# Patient Record
Sex: Female | Born: 1960 | Race: White | Hispanic: No | Marital: Married | Smoking: Never smoker
Health system: Southern US, Community
[De-identification: ages and names within clinical notes are randomized; demographics above are authoritative.]

## PROBLEM LIST (undated history)

## (undated) DIAGNOSIS — C9590 Leukemia, unspecified not having achieved remission: Secondary | ICD-10-CM

## (undated) DIAGNOSIS — Z8619 Personal history of other infectious and parasitic diseases: Secondary | ICD-10-CM

## (undated) DIAGNOSIS — Z87898 Personal history of other specified conditions: Secondary | ICD-10-CM

## (undated) DIAGNOSIS — Q359 Cleft palate, unspecified: Secondary | ICD-10-CM

## (undated) DIAGNOSIS — D219 Benign neoplasm of connective and other soft tissue, unspecified: Secondary | ICD-10-CM

## (undated) DIAGNOSIS — N979 Female infertility, unspecified: Secondary | ICD-10-CM

## (undated) DIAGNOSIS — G4730 Sleep apnea, unspecified: Secondary | ICD-10-CM

## (undated) DIAGNOSIS — R519 Headache, unspecified: Secondary | ICD-10-CM

## (undated) DIAGNOSIS — Z8639 Personal history of other endocrine, nutritional and metabolic disease: Secondary | ICD-10-CM

## (undated) DIAGNOSIS — E039 Hypothyroidism, unspecified: Secondary | ICD-10-CM

## (undated) DIAGNOSIS — G4733 Obstructive sleep apnea (adult) (pediatric): Secondary | ICD-10-CM

## (undated) DIAGNOSIS — I10 Essential (primary) hypertension: Secondary | ICD-10-CM

## (undated) DIAGNOSIS — N841 Polyp of cervix uteri: Secondary | ICD-10-CM

## (undated) DIAGNOSIS — M1990 Unspecified osteoarthritis, unspecified site: Secondary | ICD-10-CM

## (undated) DIAGNOSIS — D471 Chronic myeloproliferative disease: Secondary | ICD-10-CM

## (undated) DIAGNOSIS — E0590 Thyrotoxicosis, unspecified without thyrotoxic crisis or storm: Secondary | ICD-10-CM

## (undated) DIAGNOSIS — N920 Excessive and frequent menstruation with regular cycle: Secondary | ICD-10-CM

## (undated) DIAGNOSIS — E0500 Thyrotoxicosis with diffuse goiter without thyrotoxic crisis or storm: Secondary | ICD-10-CM

## (undated) DIAGNOSIS — Z8742 Personal history of other diseases of the female genital tract: Secondary | ICD-10-CM

## (undated) HISTORY — DX: Personal history of other endocrine, nutritional and metabolic disease: Z86.39

## (undated) HISTORY — DX: Excessive and frequent menstruation with regular cycle: N92.0

## (undated) HISTORY — DX: Polyp of cervix uteri: N84.1

## (undated) HISTORY — PX: REFRACTIVE SURGERY: SHX103

## (undated) HISTORY — DX: Personal history of other infectious and parasitic diseases: Z86.19

## (undated) HISTORY — DX: Chronic myeloproliferative disease: D47.1

## (undated) HISTORY — DX: Personal history of other specified conditions: Z87.898

## (undated) HISTORY — DX: Essential (primary) hypertension: I10

## (undated) HISTORY — DX: Female infertility, unspecified: N97.9

## (undated) HISTORY — PX: MOUTH SURGERY: SHX715

## (undated) HISTORY — DX: Unspecified osteoarthritis, unspecified site: M19.90

## (undated) HISTORY — DX: Hypothyroidism, unspecified: E03.9

## (undated) HISTORY — DX: Sleep apnea, unspecified: G47.30

## (undated) HISTORY — DX: Cleft palate, unspecified: Q35.9

## (undated) HISTORY — DX: Personal history of other diseases of the female genital tract: Z87.42

## (undated) HISTORY — DX: Benign neoplasm of connective and other soft tissue, unspecified: D21.9

## (undated) HISTORY — DX: Thyrotoxicosis with diffuse goiter without thyrotoxic crisis or storm: E05.00

## (undated) HISTORY — DX: Thyrotoxicosis, unspecified without thyrotoxic crisis or storm: E05.90

---
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
## 1984-08-08 HISTORY — PX: BUNIONECTOMY: SHX129

## 1999-02-28 ENCOUNTER — Encounter (INDEPENDENT_AMBULATORY_CARE_PROVIDER_SITE_OTHER): Payer: Self-pay | Admitting: Specialist

## 1999-02-28 ENCOUNTER — Inpatient Hospital Stay (HOSPITAL_COMMUNITY): Admission: AD | Admit: 1999-02-28 | Discharge: 1999-02-28 | Payer: Self-pay | Admitting: Obstetrics & Gynecology

## 1999-12-09 ENCOUNTER — Other Ambulatory Visit: Admission: RE | Admit: 1999-12-09 | Discharge: 1999-12-09 | Payer: Self-pay | Admitting: Obstetrics & Gynecology

## 1999-12-22 ENCOUNTER — Encounter (INDEPENDENT_AMBULATORY_CARE_PROVIDER_SITE_OTHER): Payer: Self-pay

## 1999-12-22 ENCOUNTER — Other Ambulatory Visit: Admission: RE | Admit: 1999-12-22 | Discharge: 1999-12-22 | Payer: Self-pay | Admitting: Obstetrics & Gynecology

## 2000-12-29 ENCOUNTER — Other Ambulatory Visit: Admission: RE | Admit: 2000-12-29 | Discharge: 2000-12-29 | Payer: Self-pay | Admitting: Obstetrics & Gynecology

## 2002-07-30 ENCOUNTER — Other Ambulatory Visit: Admission: RE | Admit: 2002-07-30 | Discharge: 2002-07-30 | Payer: Self-pay | Admitting: Obstetrics & Gynecology

## 2003-09-09 ENCOUNTER — Other Ambulatory Visit: Admission: RE | Admit: 2003-09-09 | Discharge: 2003-09-09 | Payer: Self-pay | Admitting: Obstetrics & Gynecology

## 2004-09-10 ENCOUNTER — Other Ambulatory Visit: Admission: RE | Admit: 2004-09-10 | Discharge: 2004-09-10 | Payer: Self-pay | Admitting: Obstetrics & Gynecology

## 2004-11-05 ENCOUNTER — Ambulatory Visit: Payer: Self-pay | Admitting: Internal Medicine

## 2004-11-08 ENCOUNTER — Ambulatory Visit: Payer: Self-pay | Admitting: Internal Medicine

## 2005-02-17 ENCOUNTER — Ambulatory Visit: Payer: Self-pay | Admitting: Internal Medicine

## 2005-02-18 ENCOUNTER — Ambulatory Visit: Payer: Self-pay | Admitting: Internal Medicine

## 2005-02-28 ENCOUNTER — Encounter (HOSPITAL_COMMUNITY): Admission: RE | Admit: 2005-02-28 | Discharge: 2005-03-14 | Payer: Self-pay | Admitting: Internal Medicine

## 2005-03-09 ENCOUNTER — Encounter: Admission: RE | Admit: 2005-03-09 | Discharge: 2005-03-09 | Payer: Self-pay | Admitting: Internal Medicine

## 2005-03-09 IMAGING — CT CT ORBIT/TEMPORAL/IAC W/O CM
3 of 4 series · 17 of 40 positions shown, 20 images · IV contrast (agent unspecified)
Comparison: None.

CLINICAL DATA: Graves disease.  
 CT OF THE ORBITS WITHOUT CONTRAST:
TECHNIQUE: Axial and coronal plane CT imaging was performed through the orbits.  No intravenous contrast was administered.

[Series 4: recon 3: axial orbits · axial · 0.33mm/px · z∈[-37,+32]mm · 12 of 65 slices shown, 15 images]
[im 5/65  brain]
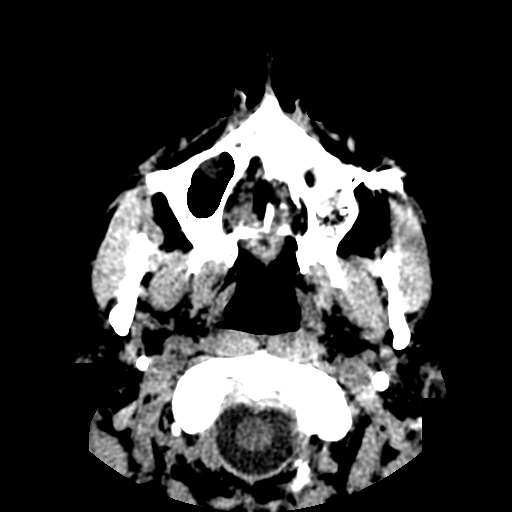
[im 5/65  bone]
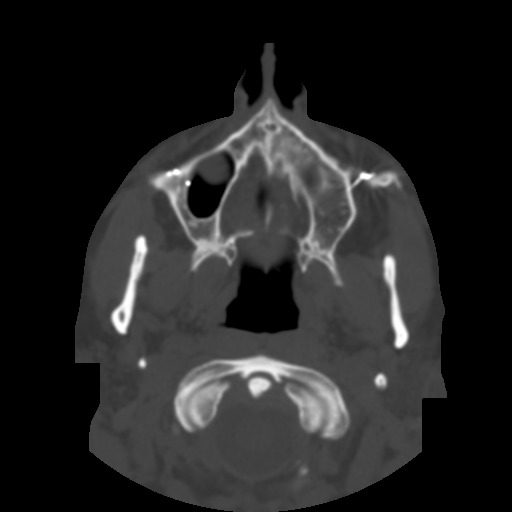
[im 10/65  bone]
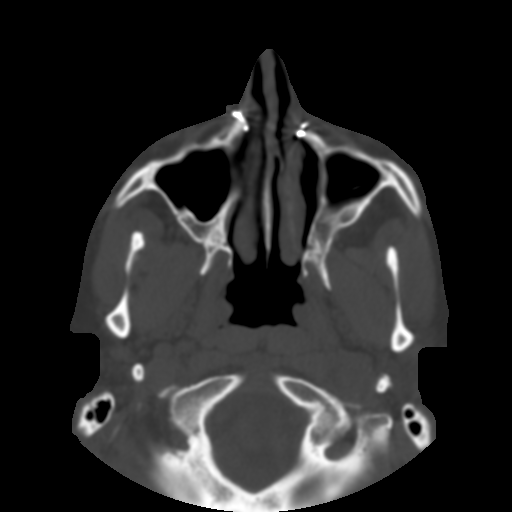
[im 15/65  bone]
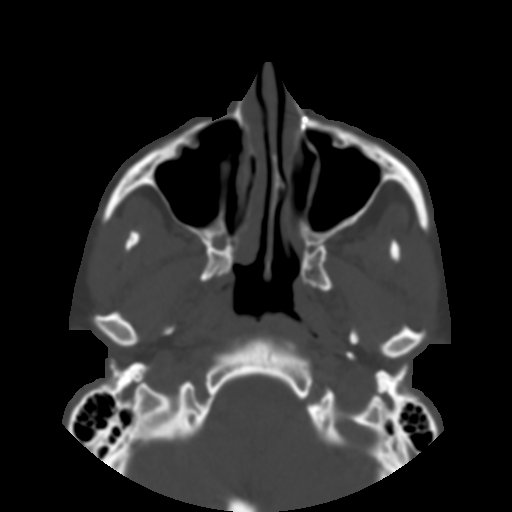
[im 20/65  bone]
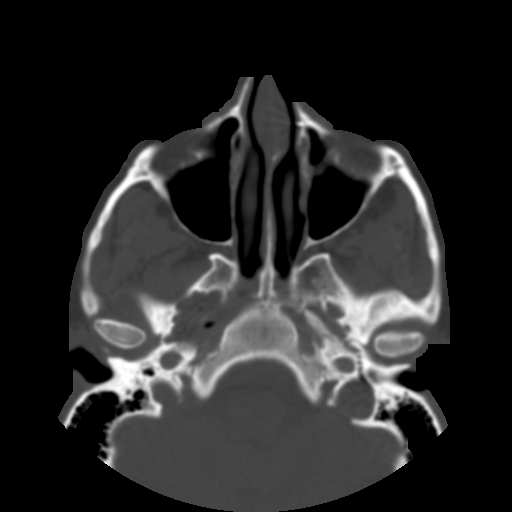
[im 25/65  brain]
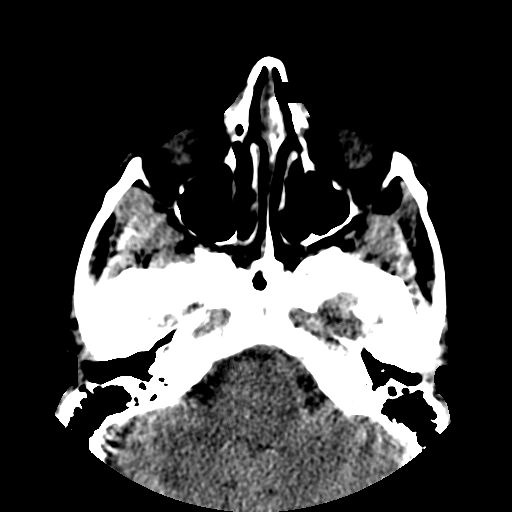
[im 25/65  bone]
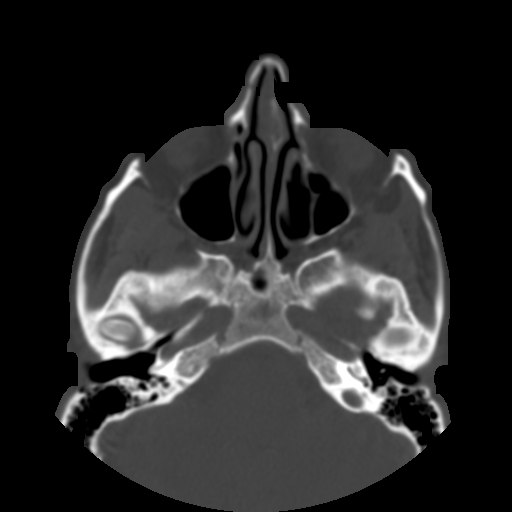
[im 30/65  bone]
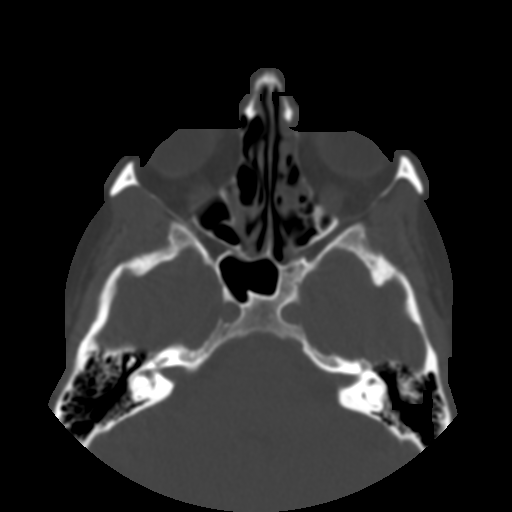
[im 35/65  bone]
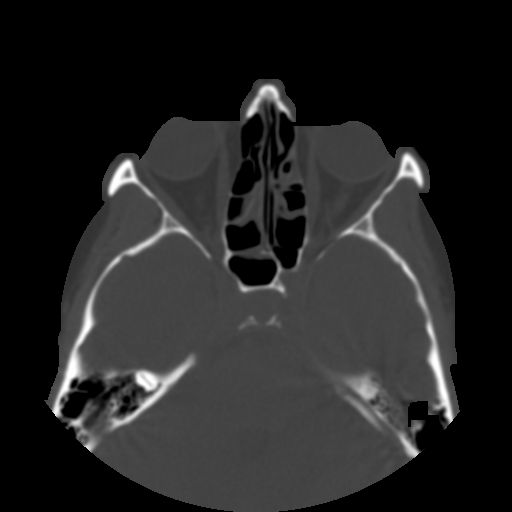
[im 40/65  bone]
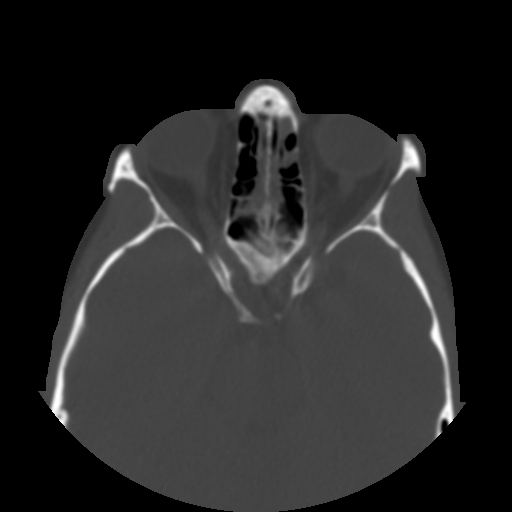
[im 45/65  brain]
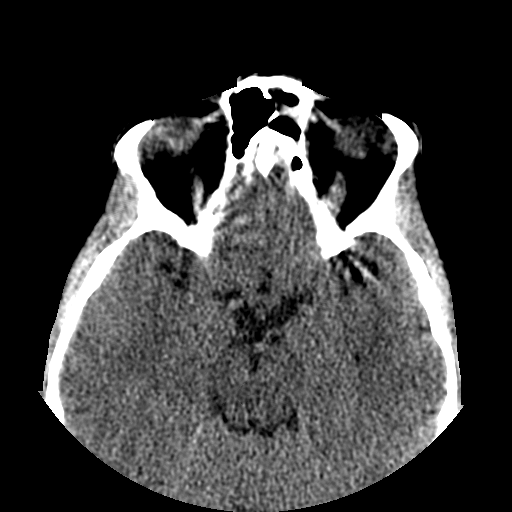
[im 45/65  bone]
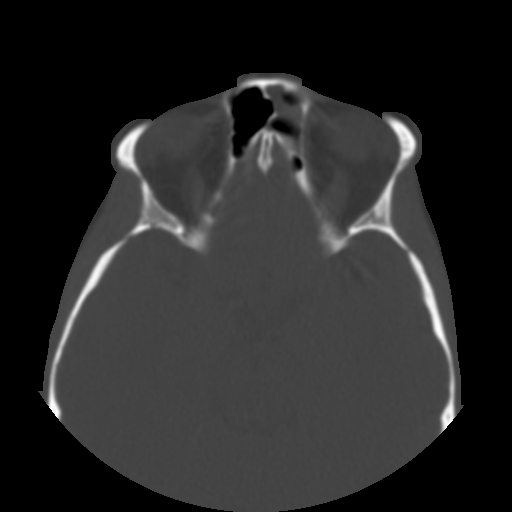
[im 50/65  bone]
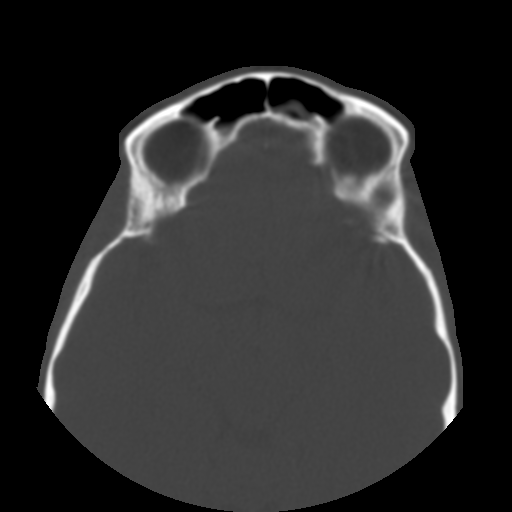
[im 55/65  bone]
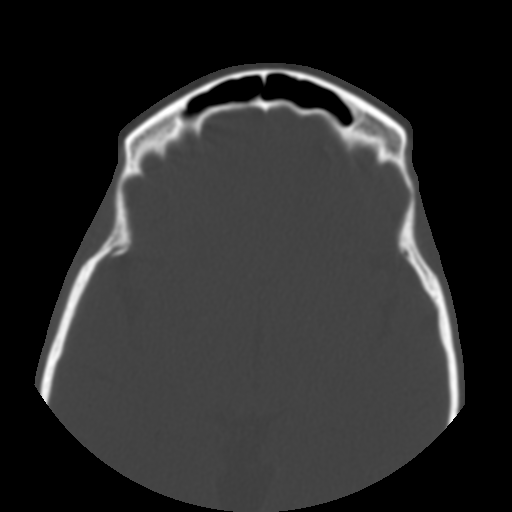
[im 60/65  bone]
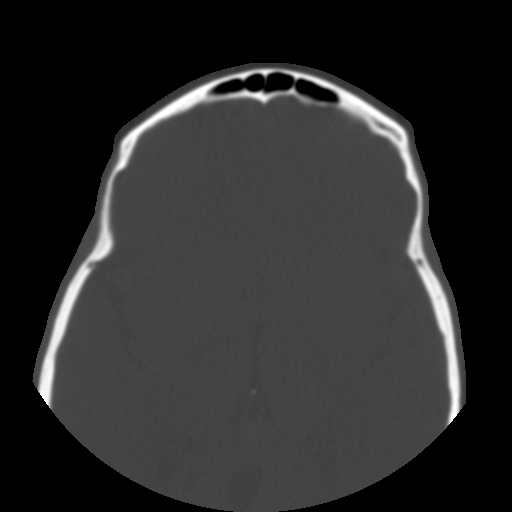

[Series 6: coronal orbits · axial · 0.33mm/px · z∈[-83,-71]mm · 2 of 39 slices shown]
[im 6/39  bone]
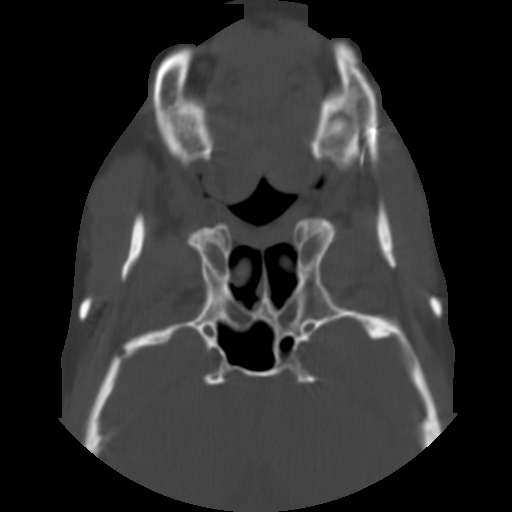
[im 11/39  bone]
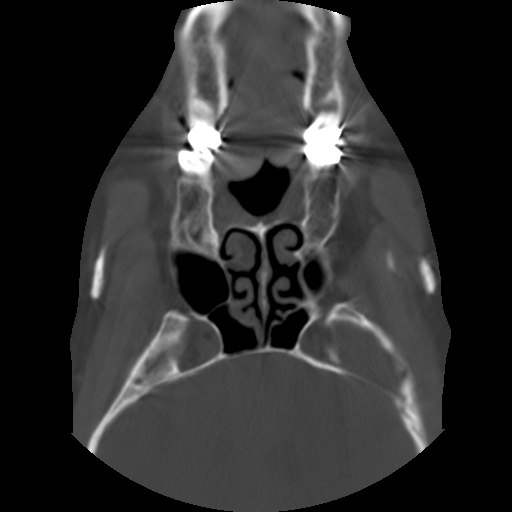

[Series 103: reformatted · coronal · 0.33mm/px · 3 of 40 slices shown]
[im 14/40  bone]
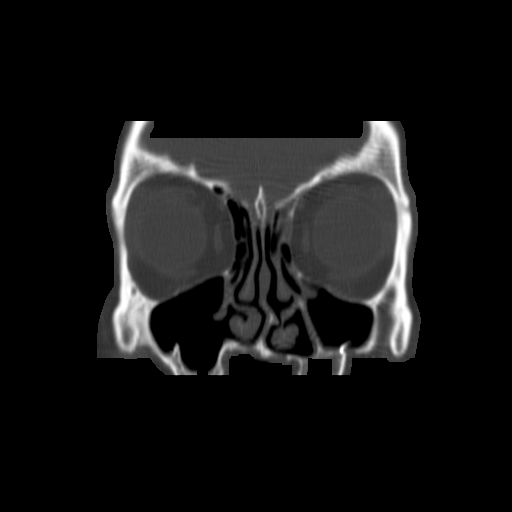
[im 18/40  bone]
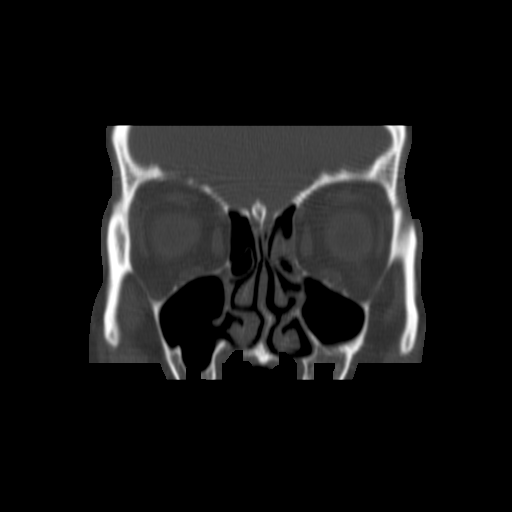
[im 22/40  bone]
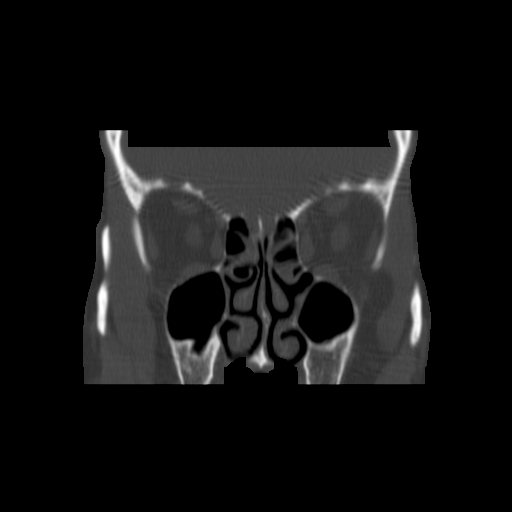

[17 of 40 positions shown; findings below may reference images not displayed]

FINDINGS: The orbits are normal in appearance.   The extraocular muscles appear normal in appearance without evidence for abnormal thickening.  The intraconal fat is well preserved.  No inflammatory-type changes are noted.  There is mild sinus disease affecting the right maxillary sinus. There is minimal left frontal sinus disease as well.
IMPRESSION: No evidence for DAANISH involvement of the extraocular muscles.

## 2005-03-16 ENCOUNTER — Encounter (HOSPITAL_COMMUNITY): Admission: RE | Admit: 2005-03-16 | Discharge: 2005-06-14 | Payer: Self-pay | Admitting: Endocrinology

## 2005-04-06 ENCOUNTER — Ambulatory Visit: Payer: Self-pay | Admitting: Internal Medicine

## 2005-05-10 ENCOUNTER — Ambulatory Visit: Payer: Self-pay | Admitting: Family Medicine

## 2005-06-07 ENCOUNTER — Ambulatory Visit: Payer: Self-pay | Admitting: Family Medicine

## 2005-06-24 ENCOUNTER — Ambulatory Visit: Payer: Self-pay | Admitting: Internal Medicine

## 2005-08-12 ENCOUNTER — Ambulatory Visit: Payer: Self-pay | Admitting: Internal Medicine

## 2005-09-19 ENCOUNTER — Ambulatory Visit: Payer: Self-pay | Admitting: Internal Medicine

## 2005-10-31 ENCOUNTER — Other Ambulatory Visit: Admission: RE | Admit: 2005-10-31 | Discharge: 2005-10-31 | Payer: Self-pay | Admitting: Obstetrics & Gynecology

## 2005-11-25 ENCOUNTER — Ambulatory Visit: Payer: Self-pay | Admitting: Internal Medicine

## 2005-12-02 ENCOUNTER — Ambulatory Visit: Payer: Self-pay | Admitting: Internal Medicine

## 2006-05-05 ENCOUNTER — Ambulatory Visit: Payer: Self-pay | Admitting: Internal Medicine

## 2006-08-02 ENCOUNTER — Ambulatory Visit: Payer: Self-pay | Admitting: Family Medicine

## 2006-09-25 ENCOUNTER — Ambulatory Visit: Payer: Self-pay | Admitting: Licensed Clinical Social Worker

## 2007-03-02 ENCOUNTER — Encounter: Payer: Self-pay | Admitting: Internal Medicine

## 2007-03-02 DIAGNOSIS — J309 Allergic rhinitis, unspecified: Secondary | ICD-10-CM | POA: Insufficient documentation

## 2007-03-02 DIAGNOSIS — E0500 Thyrotoxicosis with diffuse goiter without thyrotoxic crisis or storm: Secondary | ICD-10-CM | POA: Insufficient documentation

## 2007-03-02 DIAGNOSIS — H9190 Unspecified hearing loss, unspecified ear: Secondary | ICD-10-CM | POA: Insufficient documentation

## 2007-03-05 ENCOUNTER — Ambulatory Visit: Payer: Self-pay | Admitting: Internal Medicine

## 2007-03-05 DIAGNOSIS — G4700 Insomnia, unspecified: Secondary | ICD-10-CM | POA: Insufficient documentation

## 2007-03-05 DIAGNOSIS — M25549 Pain in joints of unspecified hand: Secondary | ICD-10-CM | POA: Insufficient documentation

## 2007-03-06 DIAGNOSIS — E0590 Thyrotoxicosis, unspecified without thyrotoxic crisis or storm: Secondary | ICD-10-CM | POA: Insufficient documentation

## 2007-05-16 ENCOUNTER — Telehealth: Payer: Self-pay | Admitting: Internal Medicine

## 2007-05-21 ENCOUNTER — Encounter: Payer: Self-pay | Admitting: Internal Medicine

## 2007-05-21 ENCOUNTER — Telehealth (INDEPENDENT_AMBULATORY_CARE_PROVIDER_SITE_OTHER): Payer: Self-pay | Admitting: *Deleted

## 2007-06-18 ENCOUNTER — Encounter: Payer: Self-pay | Admitting: Family Medicine

## 2007-09-24 ENCOUNTER — Encounter: Payer: Self-pay | Admitting: Internal Medicine

## 2008-01-14 ENCOUNTER — Telehealth: Payer: Self-pay | Admitting: Internal Medicine

## 2008-01-29 ENCOUNTER — Ambulatory Visit: Payer: Self-pay | Admitting: Internal Medicine

## 2008-06-03 ENCOUNTER — Ambulatory Visit: Payer: Self-pay | Admitting: Internal Medicine

## 2008-06-04 ENCOUNTER — Encounter: Payer: Self-pay | Admitting: Internal Medicine

## 2008-06-16 ENCOUNTER — Telehealth: Payer: Self-pay | Admitting: Family Medicine

## 2008-06-17 ENCOUNTER — Encounter: Payer: Self-pay | Admitting: Internal Medicine

## 2008-06-17 ENCOUNTER — Telehealth: Payer: Self-pay | Admitting: Internal Medicine

## 2008-08-18 ENCOUNTER — Telehealth: Payer: Self-pay | Admitting: Internal Medicine

## 2008-12-19 ENCOUNTER — Ambulatory Visit: Payer: Self-pay | Admitting: Internal Medicine

## 2008-12-19 DIAGNOSIS — M779 Enthesopathy, unspecified: Secondary | ICD-10-CM | POA: Insufficient documentation

## 2009-01-27 ENCOUNTER — Telehealth: Payer: Self-pay | Admitting: Internal Medicine

## 2009-01-29 ENCOUNTER — Telehealth: Payer: Self-pay | Admitting: Internal Medicine

## 2009-04-15 ENCOUNTER — Telehealth: Payer: Self-pay | Admitting: *Deleted

## 2009-06-30 ENCOUNTER — Encounter (INDEPENDENT_AMBULATORY_CARE_PROVIDER_SITE_OTHER): Payer: Self-pay | Admitting: *Deleted

## 2009-07-20 ENCOUNTER — Encounter (INDEPENDENT_AMBULATORY_CARE_PROVIDER_SITE_OTHER): Payer: Self-pay | Admitting: *Deleted

## 2009-08-31 ENCOUNTER — Telehealth: Payer: Self-pay | Admitting: *Deleted

## 2009-09-29 ENCOUNTER — Telehealth: Payer: Self-pay | Admitting: Internal Medicine

## 2010-04-19 ENCOUNTER — Telehealth: Payer: Self-pay | Admitting: *Deleted

## 2010-05-13 ENCOUNTER — Ambulatory Visit: Payer: Self-pay | Admitting: Internal Medicine

## 2010-05-17 ENCOUNTER — Ambulatory Visit: Payer: Self-pay | Admitting: Internal Medicine

## 2010-05-18 ENCOUNTER — Encounter: Payer: Self-pay | Admitting: *Deleted

## 2010-05-18 LAB — CONVERTED CEMR LAB
AST: 18 units/L (ref 0–37)
Albumin: 3.8 g/dL (ref 3.5–5.2)
BUN: 17 mg/dL (ref 6–23)
Basophils Relative: 1 % (ref 0.0–3.0)
Bilirubin, Direct: 0.1 mg/dL (ref 0.0–0.3)
CO2: 26 meq/L (ref 19–32)
Cholesterol: 195 mg/dL (ref 0–200)
Eosinophils Relative: 3.8 % (ref 0.0–5.0)
GFR calc non Af Amer: 76.32 mL/min (ref >60)
Lymphocytes Relative: 30.4 % (ref 12.0–46.0)
MCHC: 35.2 g/dL (ref 30.0–36.0)
Monocytes Absolute: 0.5 10*3/uL (ref 0.1–1.0)
Neutrophils Relative %: 55.6 % (ref 43.0–77.0)
Platelets: 267 10*3/uL (ref 150.0–400.0)
Potassium: 4.7 meq/L (ref 3.5–5.1)
RBC: 4.34 M/uL (ref 3.87–5.11)
RDW: 12.5 % (ref 11.5–14.6)
Total Bilirubin: 0.5 mg/dL (ref 0.3–1.2)
Total Protein: 6.7 g/dL (ref 6.0–8.3)
WBC: 5.1 10*3/uL (ref 4.5–10.5)

## 2010-08-08 DIAGNOSIS — Z8742 Personal history of other diseases of the female genital tract: Secondary | ICD-10-CM

## 2010-08-08 HISTORY — DX: Personal history of other diseases of the female genital tract: Z87.42

## 2010-09-07 NOTE — Letter (Signed)
Summary: Generic Letter  [facility]  [address] [place], [place] [address]   Phone: [phone]  Fax: [phone]    05/18/2010  Ambrea Delpizzo [address] [place], [place]  [address]  Dear Ms. Clent Ridges,  (1) TSH (TSH)   FastTSH                   2.12 uIU/mL                 0.35-5.50  Tests: (2) Hepatic/Liver Function Panel (HEPATIC)   Total Bilirubin           0.5 mg/dL                   5.7-8.4   Direct Bilirubin          0.1 mg/dL                   6.9-6.2   Alkaline Phosphatase      44 U/L                      39-117   AST                       18 U/L                      0-37   ALT                       14 U/L                      0-35   Total Protein             6.7 g/dL                    9.5-2.8   Albumin                   3.8 g/dL                    4.1-3.2  Tests: (3) CBC Platelet w/Diff (CBCD)   White Cell Count          5.1 K/uL                    4.5-10.5   Red Cell Count            4.34 Mil/uL                 3.87-5.11   Hemoglobin                14.2 g/dL                   44.0-10.2   Hematocrit                40.3 %                      36.0-46.0   MCV                       92.8 fl                     78.0-100.0   MCHC  35.2 g/dL                   14.7-82.9   RDW                       12.5 %                      11.5-14.6   Platelet Count            267.0 K/uL                  150.0-400.0   Neutrophil %              55.6 %                      43.0-77.0   Lymphocyte %              30.4 %                      12.0-46.0   Monocyte %                9.2 %                       3.0-12.0   Eosinophils%              3.8 %                       0.0-5.0   Basophils %               1.0 %                       0.0-3.0   Neutrophill Absolute      2.8 K/uL                    1.4-7.7   Lymphocyte Absolute       1.6 K/uL                    0.7-4.0   Monocyte Absolute         0.5 K/uL                    0.1-1.0  Eosinophils, Absolute                  0.2 K/uL                    0.0-0.7   Basophils Absolute        0.1 K/uL                    0.0-0.1  Tests: (4) BMP (METABOL)   Sodium                    139 mEq/L                   135-145   Potassium                 4.7 mEq/L                   3.5-5.1   Chloride                  105 mEq/L  96-112   Carbon Dioxide            26 mEq/L                    19-32   Glucose                   89 mg/dL                    16-10   BUN                       17 mg/dL                    9-60   Creatinine                0.8 mg/dL                   4.5-4.0   Calcium                   9.3 mg/dL                   9.8-11.9   GFR                       76.32 mL/min                >60  Tests: (5) Lipid Panel (LIPID)   Cholesterol               195 mg/dL                   1-478     ATP III Classification            Desirable:  < 200 mg/dL                    Borderline High:  200 - 239 mg/dL               High:  > = 240 mg/dL   Triglycerides             96.0 mg/dL                  2.9-562.1     Normal:  <150 mg/dL     Borderline High:  308 - 199 mg/dL   HDL                       65.78 mg/dL                 >46.96   VLDL Cholesterol          19.2 mg/dL                  2.9-52.8   LDL Cholesterol      [H]  413 mg/dL                   2-44  CHO/HDL Ratio:  CHD Risk                             4                    Men          Women     1/2 Average Risk  3.4          3.3     Average Risk          5.0          4.4     2X Average Risk          9.6          7.1     3X Average Risk          15.0          11.0                 Your labs are normal. If you have any questions, please give [place] a call at [phone]      Sincerely,   Tor Netters, CMA (AAMA)

## 2010-09-07 NOTE — Progress Notes (Signed)
  Phone Note Call from Patient Call back at Home Phone [phone]   Caller: Spouse Summary of Call: please refill Zolpidem for 6 months  Initial call taken by: Nelwyn Salisbury MD,  April 19, 2010 1:03 PM  Follow-up for Phone Call        ok to do . but needs  yearly offic e check when convenient Follow-up by: Madelin Headings MD,  April 19, 2010 1:39 PM  Additional Follow-up for Phone Call Additional follow up Details #1::        Rx called in. Additional Follow-up by: Romualdo Bolk, CMA (AAMA),  April 19, 2010 4:21 PM    Prescriptions: AMBIEN 10 MG  TABS (ZOLPIDEM TARTRATE) 1 by mouth q hs as needed.  #30 x 5   Entered by:   Romualdo Bolk, CMA (AAMA)   Authorized by:   Madelin Headings MD   Signed by:   Romualdo Bolk, CMA (AAMA) on 04/19/2010   Method used:   Telephoned to ...       [facility] [address] [phone]* (retail)       [address]       [place], [place]  [address]       Ph: [phone] or [phone]       Fax: [phone]   RxID:   [id number]

## 2010-09-07 NOTE — Assessment & Plan Note (Signed)
Summary: med check/refills/cjr   Vital Signs:  Patient profile:   50 year old female Menstrual status:  regular LMP:     04/21/2010 Height:      67.5 inches Weight:      168 pounds BMI:     26.02 Pulse rate:   78 / minute BP sitting:   120 / 80  (right arm) Cuff size:   regular  Vitals Entered By: Romualdo Bolk, CMA (AAMA) (May 17, 2010 8:54 AM) CC: Follow-up visit on meds LMP (date): 04/21/2010 LMP - Character: heavy Menarche (age onset years): 10   Menses interval (days): 28-30 Menstrual flow (days): 5 Enter LMP: 04/21/2010   History of Present Illness: Christina [name] comes in today  for follow up of medicine  and rx  Since last visit over a year ago she has done well . Sees Dr Talmage Nap for her thyroid replacement and sees her GYNE. SHe still has some joint aches  . Takes  celebrex  daily almost daily for    hands and feet.    denies se of meds .NO recent labs to monitor for this  taking ambien    fairly regulary and helps .working and childerm.  No new symptoms . Thyroid   stable     .    Gets yearly check.   Preventive Screening-Counseling & Management  Alcohol-Tobacco     Alcohol drinks/day: 1     Alcohol type: wine     Smoking Status: never     Passive Smoke Exposure: no  Caffeine-Diet-Exercise     Caffeine use/day: 1     Does Patient Exercise: no  Current Medications (verified): 1)  Zyrtec Allergy 10 Mg  Tabs (Cetirizine Hcl) 2)  Synthroid 112 Mcg Tabs (Levothyroxine Sodium) 3)  Multi-Vitamin   Tabs (Multiple Vitamin) 4)  Ambien 10 Mg  Tabs (Zolpidem Tartrate) .Marland Kitchen.. 1 By Mouth Q Hs As Needed. 5)  Celebrex 200 Mg Caps (Celecoxib) .Marland Kitchen.. 1 By Mouth Two Times A Day  Allergies (verified): 1)  ! Toprol Xl (Metoprolol Succinate)  Past History:  Past medical, surgical, family and social histories (including risk factors) reviewed, and no changes noted (except as noted below).  Past Medical History: Allergic rhinitis Hyperthyroidism graves s/p rai  8/06now  hypothyroid.  Dr. Horald Pollen Hearing deficit. with hearing aids Insomnia    Past Surgical History: Tonsillectomy Oral Surgery Bunion Surgery laser  surgery for vision.   Past History:  Care Management: Endocrinology: Horald Pollen Gynecology: Arlyce Dice  Family History: Reviewed history from 01/29/2008 and no changes required. Family History Hypertension Prostate cancer GF DM mom  Social History: Reviewed history from 12/19/2008 and no changes required. 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         ha with advil  Physical Exam  General:  alert, well-developed, well-nourished, and well-hydrated.   Head:  normocephalic and atraumatic.   Eyes:  vision grossly intact.   Neck:  palpable non tender Lungs:  Normal respiratory effort, chest expands symmetrically. Lungs are clear to auscultation, no crackles or wheezes. Heart:  Normal rate and regular rhythm. S1 and S2 normal without gallop, murmur, click, rub or  other extra sounds.no lifts.   Abdomen:  Bowel sounds positive,abdomen soft and non-tender without masses, organomegaly or  noted. Msk:  no joint tenderness, no joint swelling, no joint warmth, and no redness over joints.   Pulses:  pulses intact without delay   Extremities:  no clubbing cyanosis or edema  Neurologic:  alert & oriented X3, strength normal in all extremities, gait normal, and DTRs symmetrical and normal.    no tremor Skin:  turgor normal, color normal, no ecchymoses, and no petechiae.   Cervical Nodes:  No lymphadenopathy noted Psych:  Oriented X3, normally  interactive, good eye contact, not anxious appearing, and not depressed appearing.     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 No alarm systemic symptoms   Problem # 2:  INSOMNIA (ICD-780.52) ongogind  .  idsc risk  benefit of meds . continue for now Her updated medication list for this problem includes:    Ambien 10 Mg Tabs (Zolpidem tartrate) .Marland Kitchen... 1 by mouth q hs as needed.  Orders: TLB-TSH (Thyroid Stimulating Hormone) (84443-TSH)  Problem # 3:  GRAVE'S DISEASE (ICD-242.00) on replacement   stable Orders: Venipuncture ([address]) Specimen Handling ([address]) TLB-TSH (Thyroid Stimulating Hormone) (84443-TSH)  Problem # 4:  ALLERGIC RHINITIS (ICD-477.9)  Her updated medication list for this problem includes:    Zyrtec Allergy 10 Mg Tabs (Cetirizine hcl)  Problem # 5:  PREVENTIVE HEALTH CARE (ICD-V70.0) due for lab and monitoring  Orders: Venipuncture ([address]) Specimen Handling ([address]) TLB-TSH (Thyroid Stimulating Hormone) (84443-TSH) TLB-Hepatic/Liver Function Pnl (80076-HEPATIC) TLB-CBC Platelet - w/Differential (85025-CBCD) TLB-BMP (Basic Metabolic Panel-BMET) (80048-METABOL) TLB-Lipid Panel (80061-LIPID)  Complete Medication List: 1)  Zyrtec Allergy 10 Mg Tabs (Cetirizine hcl) 2)  Synthroid 112 Mcg Tabs (Levothyroxine sodium) 3)  Multi-vitamin Tabs (Multiple vitamin) 4)  Ambien 10 Mg Tabs (Zolpidem tartrate) .Marland Kitchen.. 1 by mouth q hs as needed. 5)  Celebrex 200 Mg Caps (Celecoxib) .Marland Kitchen.. 1 by mouth two times a day  Patient Instructions: 1)  You will be informed of lab results when available.  2)  call when you want the colonoscopy    referral.  at age 18  3)  ROV yearly  Prescriptions: CELEBREX 200 MG CAPS (CELECOXIB) 1 by mouth two times a day  #60 x 3   Entered and Authorized by:   Madelin Headings MD   Signed by:   Madelin Headings MD on 05/17/2010   Method used:   Electronically to         [facility]. [address] [phone]* (retail)       [address]       [place], [place]  [address]       Ph: [phone] or [phone]       Fax: [phone]   RxID:   [id number]

## 2010-09-07 NOTE — Progress Notes (Signed)
  Phone Note Call from Patient Call back at Home Phone [phone]   Caller: Spouse Summary of Call: For 2 weeks has had constant sinus pressure, nasal congestion, and HA. No fever or ST or cough. has taken 8 days of Avelox with no improvement. Also using Nasonex, Advil, and Mucinex.  Initial call taken by: Nelwyn Salisbury MD,  August 31, 2009 1:06 PM  Follow-up for Phone Call        prednisone  20 mg take 2 by mouth once daily for  5 days  or as directed .Disp 10 of 20 mg if not better  then OV to evaluate.  Follow-up by: Madelin Headings MD,  August 31, 2009 1:19 PM  Additional Follow-up for Phone Call Additional follow up Details #1::        Rx called in and pt aware. Additional Follow-up by: Romualdo Bolk, CMA (AAMA),  August 31, 2009 2:30 PM    New/Updated Medications: PREDNISONE 20 MG TABS (PREDNISONE) 2 by mouth once daily for 5 days Prescriptions: PREDNISONE 20 MG TABS (PREDNISONE) 2 by mouth once daily for 5 days  #10 x 0   Entered by:   Romualdo Bolk, CMA (AAMA)   Authorized by:   Madelin Headings MD   Signed by:   Romualdo Bolk, CMA (AAMA) on 08/31/2009   Method used:   Electronically to        [facility]. [address] [phone]* (retail)       [address]       [place], [place]  [address]       Ph: [phone] or [phone]       Fax: [phone]   RxID:   [id number]

## 2010-09-07 NOTE — Progress Notes (Signed)
  Phone Note Call from Patient Call back at Home Phone [phone]   Caller: Spouse Reason for Call: Talk to Doctor Summary of Call: please refill Zolpidem  Initial call taken by: Nelwyn Salisbury MD,  September 29, 2009 1:33 PM  Follow-up for Phone Call        please refill x 5  Follow-up by: Madelin Headings MD,  September 29, 2009 1:35 PM  Additional Follow-up for Phone Call Additional follow up Details #1::        Phone Call Completed, Rx Called In Additional Follow-up by: Alfred Levins, CMA,  September 29, 2009 3:21 PM    Prescriptions: AMBIEN 10 MG  TABS (ZOLPIDEM TARTRATE) 1 by mouth q hs as needed.  #30 x 5   Entered by:   Alfred Levins, CMA   Authorized by:   Madelin Headings MD   Signed by:   Alfred Levins, CMA on 09/29/2009   Method used:   Telephoned to ...       [facility] [address] [phone]* (retail)       [address]       [place], [place]  [address]       Ph: [phone] or [phone]       Fax: [phone]   RxID:   [id number]

## 2010-09-07 NOTE — Assessment & Plan Note (Signed)
Summary: FLU SHOT/CB  Nurse Visit   Allergies: 1)  ! Toprol Xl (Metoprolol Succinate)  Orders Added: 1)  Admin 1st Vaccine [90471] 2)  Flu Vaccine 62yrs + [27253] Flu Vaccine Consent Questions     Do you have a history of severe allergic reactions to this vaccine? no    Any prior history of allergic reactions to egg and/or gelatin? no    Do you have a sensitivity to the preservative Thimersol? no    Do you have a past history of Guillan-Barre Syndrome? no    Do you currently have an acute febrile illness? no    Have you ever had a severe reaction to latex? no    Vaccine information given and explained to patient? yes    Are you currently pregnant? no    Lot Number:[id number]   Exp Date:02/05/2011   Site Given  Rt Deltoid IM Romualdo Bolk, CMA (AAMA)  May 13, 2010 9:07 AM

## 2010-09-14 ENCOUNTER — Other Ambulatory Visit: Payer: Self-pay | Admitting: Internal Medicine

## 2010-09-15 HISTORY — PX: DILATION AND CURETTAGE OF UTERUS: SHX78

## 2010-09-15 NOTE — Telephone Encounter (Signed)
Please Advise

## 2010-09-23 ENCOUNTER — Other Ambulatory Visit: Payer: Self-pay | Admitting: Family Medicine

## 2010-09-23 DIAGNOSIS — G4700 Insomnia, unspecified: Secondary | ICD-10-CM

## 2010-09-23 MED ORDER — ZOLPIDEM TARTRATE ER 12.5 MG PO TBCR: 12.5000 mg | EXTENDED_RELEASE_TABLET | Freq: Every evening | ORAL | Status: DC | PRN

## 2010-09-23 MED ORDER — ZOLPIDEM TARTRATE 10 MG PO TABS: 10.0000 mg | ORAL_TABLET | Freq: Every evening | ORAL | Status: DC | PRN

## 2010-09-23 NOTE — Telephone Encounter (Signed)
This is supposed to be generic [place] and she was never on the [place] cr  Please correct this in the system and make sure the correct one is given. And called in.

## 2010-09-23 NOTE — Telephone Encounter (Signed)
Please rewrite her rx for Zolpidem 10 mg for #90 with one refill so we can use [facility]. We will cancel the rx at [facility].

## 2010-09-24 NOTE — H&P (Signed)
Christina, [name]                     ACCOUNT NO.:  [id number]  MEDICAL RECORD NO.:  [id number]           PATIENT TYPE:  O  LOCATION:  [facility]                           [facility]ILITY:  [facility]  PHYSICIAN:  Osborn Coho, M.D.   DATE OF BIRTH:  1961-05-08  DATE OF ADMISSION:  09/15/2010 DATE OF DISCHARGE:                             HISTORY & PHYSICAL   HISTORY OF PRESENT ILLNESS:  [name] is a 50 year old married white female para 0-0-7-0, presenting for hysteroscopy D and C with endometrial ablation because of menorrhagia and endometrial polyp.  Over the past 5 years, the patient reports that her periods have increased significantly.  She currently will flow for 7 days, during which time she changes a tampon with a pad every 1-2 hours.  Additionally, she will experience menstrual cramping that she rates as a 5/10 on a 10-point pain scale.   She is able, however,  to find relief by using Advil and heat.  She admits to constipation and occasional intermenstrual bleeding but denies any urinary tract symptoms, vaginitis symptoms, or dyspareunia.  Pelvic ultrasound on February 2012 showed uterus measuring 9.31 cm x 6.29 cm x 4.13 cm with an endometrium, revealing an endometrial mass measuring 1.13 x 0.66 cm with positive single blood flow as demonstrated by color flow Doppler and suggestive of polyp. Lastly, the patient was noted to have a left lateral fibroid measuring 3.32 x 3.11 x 3.13 cm with both ovaries appeared normal on that study. A review of both medical and surgical management options were given to the patient, however, she desires to proceed with hysteroscopy D and C along with endometrial ablation for management of her symptoms.  PAST MEDICAL HISTORY:  OB History:  Gravida 7, para 0-0-7-0.  The patient has a history of recurrent miscarriages. GYN History:  Menarche at 50 years old.  Last menstrual period September 04, 2010.  The patient uses vasectomy as her method of contraception. She  denies any history of sexually transmitted diseases or abnormal Pap smears.  Her most recent Pap was January 2012. Medical History:  Vitamin D deficiency, uterine fibroids, infertility, Graves disease, insomnia, and hearing impairment. Surgical History:  In 1965, tonsillectomy; in 1986, bunionectomy. Denies any history of blood transfusions or problems with anesthesia.  FAMILY HISTORY:  Hypertension, diabetes, osteoporosis, rheumatoid arthritis, and cardiovascular disease.  SOCIAL HISTORY:  The patient is married and she works as an Biomedical scientist at [employer].  She consumes a glass of wine daily.  Denies any tobacco or illicit drug use.  CURRENT MEDICATIONS: 1. Synthroid 112 mcg daily. 2. Celebrex 100 mg daily. 3. Ambien 10 mg at bedtime as needed. 4. Vitamin D 50,000 units once a week.  She has no known drug allergies.  Denies any sensitivities to latex, soy, shellfish, or peanuts.  REVIEW OF SYSTEMS:  The patient does wear glasses.  She denies any chest pain, shortness of breath, headache, vision changes, nausea, vomiting, diarrhea, difficulty swallowing, myalgias, arthralgias, skin rashes and except as is mentioned in history of present illness, the patient's review of systems is otherwise negative.  PHYSICAL EXAMINATION:  VITAL SIGNS:  Blood pressure 120/78, pulse 72, respirations 16, temperature 96.7 degrees Fahrenheit orally, weight 170 pounds, height 5 feet and 7-1/2 inches tall, body mass index 27. NECK:  Supple without masses.  There is no thyromegaly or cervical adenopathy. HEART:  Regular rate and rhythm. LUNGS:  Clear. BACK:  No CVA tenderness. ABDOMEN:  No tenderness, masses, or organomegaly. EXTREMITIES:  No clubbing, cyanosis, or edema. PELVIC:  EG/BUS is normal.  Vagina is normal.  Cervix is nontender without lesions.  Uterus appears normal size, shape, and consistency without tenderness.  Adnexa without tenderness or masses.  IMPRESSION: 1. Menorrhagia. 2.  Endometrial polyps.  DISPOSITION:  A discussion was held with the patient regarding the indications for her procedures along with their risks which include but are not limited to reaction to anesthesia, damage to adjacent organs, infection, excessive bleeding, and the possibility that there will be no change in her menstrual bleeding volume.  The patient verbalized understanding of these risk and has consented to proceed with hysteroscopy D and C with endometrial ablation on September 28, 2010, at [facility] at 1 o'clock p.m.     Elmira J. Lowell Guitar, P.A.-C   ______________________________ Osborn Coho, M.D.    EJP/MEDQ  D:  09/21/2010  T:  09/22/2010  Job:  [id number]  Electronically Signed by Raylene Everts. on 09/22/2010 10:04:17 PM Electronically Signed by Osborn Coho M.D. on 09/24/2010 09:33:05 AM

## 2010-09-28 ENCOUNTER — Ambulatory Visit (HOSPITAL_COMMUNITY)
Admission: RE | Admit: 2010-09-28 | Discharge: 2010-09-28 | Disposition: A | Discharge status: Home / Self Care | Payer: Managed Care, Other (non HMO) | Source: Ambulatory Visit | Attending: Obstetrics and Gynecology | Admitting: Obstetrics and Gynecology

## 2010-09-28 ENCOUNTER — Other Ambulatory Visit: Payer: Self-pay | Admitting: Obstetrics and Gynecology

## 2010-09-28 ENCOUNTER — Other Ambulatory Visit (HOSPITAL_COMMUNITY): Payer: Managed Care, Other (non HMO)

## 2010-09-28 DIAGNOSIS — N840 Polyp of corpus uteri: Secondary | ICD-10-CM | POA: Insufficient documentation

## 2010-09-28 DIAGNOSIS — N920 Excessive and frequent menstruation with regular cycle: Secondary | ICD-10-CM | POA: Insufficient documentation

## 2010-09-28 HISTORY — PX: NOVASURE ABLATION: SHX5394

## 2010-09-28 HISTORY — PX: HYSTEROSCOPY WITH D & C: SHX1775

## 2010-09-28 LAB — HCG, SERUM, QUALITATIVE: Preg, Serum: NEGATIVE

## 2010-09-28 LAB — CBC
Hemoglobin: 14.1 g/dL (ref 12.0–15.0)
MCH: 30.8 pg (ref 26.0–34.0)
MCV: 89.7 fL (ref 78.0–100.0)
Platelets: 259 10*3/uL (ref 150–400)
RDW: 12.5 % (ref 11.5–15.5)

## 2010-11-04 NOTE — Op Note (Signed)
  NAMEJUN, RIGHTMYER                     ACCOUNT NO.:  [id number]  MEDICAL RECORD NO.:  [id number]           PATIENT TYPE:  O  LOCATION:  [facility]                          [facility]ILITY[facility]  PHYSICIAN:  Osborn Coho, M.D.   DATE OF BIRTH:  12-06-1960  DATE OF PROCEDURE:  09/28/2010 DATE OF DISCHARGE:  09/28/2010                              OPERATIVE REPORT   PREOPERATIVE DIAGNOSES: 1. Menorrhagia. 2. Polyp.  POSTOPERATIVE DIAGNOSES: 1. Menorrhagia. 2. Polyp.  PROCEDURES: 1. Hysteroscopy. 2. D and C. 3. NovaSure ablation.  ATTENDING:  Osborn Coho, MD  ANESTHESIA:  General via LMA.  FINDINGS:  Uterus sounded to 10.5 cm, cervical length measured 4.5 cm, cavity length measured 6 cm, cavity width measured 4.6 cm, and time of ablation was 75 seconds.  SPECIMENS TO PATHOLOGY:  Endometrial curettings.  FLUIDS:  800 mL.  URINE OUTPUT:  Quantity sufficient via straight cath prior to procedure.  HYSTEROSCOPIC FLUID DEFICIT OF GLYCINE:  265 mL.  ESTIMATED BLOOD LOSS:  Minimal.  COMPLICATIONS:  None.  DESCRIPTION OF PROCEDURE:  The patient is taken to the operating room after risks, benefits, and alternatives were discussed with the patient. The patient verbalized understanding.  Consent signed and witnessed. The patient was placed under general anesthesia and prepped and draped in normal sterile fashion in the dorsal lithotomy position.  A bivalve speculum was placed in the patient's vagina and the anterior lip of the cervix was grasped with a single-tooth tenaculum.  A paracervical block was administered using a total of 10 mL of 1% lidocaine.  The cervix was dilated for passage of the hysteroscope and measurement findings as noted above.  After passage of the hysteroscope into the uterine cavity, there were no obvious intracavitary lesions except for questionable small polyp in the left cornual region that was measuring probably less than a centimeter.  The cervix was then  dilated for passage of the NovaSure instrument.  The NovaSure instrument was introduced into the uterine cavity with the measurements as noted above.  Ablation was performed without difficulty and the instrument was removed.  The hysteroscope was reintroduced and good ablation results were noted.  All instruments were removed.  There was good hemostasis at the tenaculum site.  Count was correct.  The patient tolerated the procedure well and was awaiting transfer to recovery room in good condition.     Osborn Coho, M.D.     AR/MEDQ  D:  09/28/2010  T:  09/29/2010  Job:  [id number]  Electronically Signed by Osborn Coho M.D. on 11/04/2010 09:49:23 AM

## 2011-04-14 ENCOUNTER — Encounter: Payer: Self-pay | Admitting: Internal Medicine

## 2011-04-19 ENCOUNTER — Ambulatory Visit (INDEPENDENT_AMBULATORY_CARE_PROVIDER_SITE_OTHER): Payer: Managed Care, Other (non HMO) | Admitting: Internal Medicine

## 2011-04-19 ENCOUNTER — Encounter: Payer: Self-pay | Admitting: Internal Medicine

## 2011-04-19 VITALS — BP 120/80 | HR 66 | Ht 67.5 in | Wt 171.0 lb

## 2011-04-19 DIAGNOSIS — E0500 Thyrotoxicosis with diffuse goiter without thyrotoxic crisis or storm: Secondary | ICD-10-CM

## 2011-04-19 DIAGNOSIS — M2550 Pain in unspecified joint: Secondary | ICD-10-CM

## 2011-04-19 DIAGNOSIS — G4700 Insomnia, unspecified: Secondary | ICD-10-CM

## 2011-04-19 DIAGNOSIS — J309 Allergic rhinitis, unspecified: Secondary | ICD-10-CM

## 2011-04-19 MED ORDER — ZOLPIDEM TARTRATE 10 MG PO TABS: 10.0000 mg | ORAL_TABLET | Freq: Every evening | ORAL | Status: DC | PRN

## 2011-04-19 NOTE — Patient Instructions (Signed)
Get mammogram Make sure  liver function test with labs  . Call when want [place] to  Refer for colonosocopy.

## 2011-04-19 NOTE — Progress Notes (Signed)
  Subjective:    Patient ID: Christina [name], female    DOB: [birth year], 50 y.o.   MRN: [id number]  HPI Patient comes in today for follow up of  multiple medical problems.  Since her last visit has no major change in health but hair is changing again and wonders if thyroid is off. She is going to see Dr Talmage Nap soon.   Hand pain poss oa  celebrex once  A day. No se noted  Helps her  Hands and  feet. Had neg CTS  eval  Stiffness  walking in am . 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  Mom OA.   Sleep  Taking med  every night.. limits etoh.     No rebound at present  . Aware of dependncy risk   Allergy uses otc  No current problems  Review of Systems ROS:  GEN/ HEENTNo fever, significant weight changes sweats headaches vision problems CV/ PULM; No chest pain shortness of breath cough, syncope,edema  change in exercise tolerance. GI /GU: No adominal pain, vomiting, change in bowel habits. No blood in the stool. No significant GU symptoms. SKIN/HEME: ,no acute skin rashes suspicious lesions or bleeding. No lymphadenopathy, nodules, masses.  NEURO/ PSYCH:  No neurologic signs such as weakness numbness No depression anxiety. IMM/ Allergy: No unusual infections.  Allergy .   As above  REST of 12 system review negative as per hpi  Past history family history social history reviewed in the electronic medical record.     Objective:   Physical Exam Physical Exam: Vital signs reviewed YQM:VHQI is a well-developed well-nourished alert cooperative  white female who appears her stated age in no acute distress.  HEENT: normocephalic  traumatic , Eyes: PERRL EOM's full, conjunctiva clear, Nares: paten,t no deformity discharge or tenderness., Ears: no deformity EAC's clear TMs with normal landmarks. Mouth: clear OP, no lesions, edema.  Moist mucous membranes. NECK: supple without masses, or bruits.  Thyroid palpable CHEST/PULM:  Clear to auscultation and percussion  breath sounds equal no wheeze , rales or rhonchi CV: PMI is nondisplaced, S1 S2 no gallops, murmurs, rubs. Peripheral pulses are full without delay.No JVD .  ABDOMEN: Bowel sounds normal nontender  No guard or rebound, no hepato splenomegal no CVA tenderness.   Extremtities:  No clubbing cyanosis or edema, no acute joint swelling or redness no focal atrophy no significant deformity   NEURO:  Oriented x3, cranial nerves 3-12 appear to be intact,  Except decrease hearing no obvious focal weakness,gait within normal limits  SKIN: No acute rashes normal turgor, color, no bruising or petechiae. PSYCH: Oriented, good eye contact, no obvious depression anxiety, cognition and judgment appear normal.     Assessment & Plan:  Sleep  Problem  Risk benefit of medication discussed. And ok to continue at this time  Refill x 6 months .  THyroid disease   ageree with recheck  . She will see endo for this. Arthralgia and joint pain  No progression on celebrex  Monitor to get lfts  Bmp q 6- 12 months the patient aware of indication for return   Allergic rh  Stable    Reviewed  Call for colonoscopy referral when ready

## 2011-05-04 ENCOUNTER — Ambulatory Visit (INDEPENDENT_AMBULATORY_CARE_PROVIDER_SITE_OTHER): Payer: Managed Care, Other (non HMO) | Admitting: Internal Medicine

## 2011-05-04 DIAGNOSIS — Z23 Encounter for immunization: Secondary | ICD-10-CM

## 2011-06-02 ENCOUNTER — Encounter: Payer: Self-pay | Admitting: Internal Medicine

## 2011-06-14 ENCOUNTER — Encounter: Payer: Self-pay | Admitting: Internal Medicine

## 2011-07-05 ENCOUNTER — Telehealth: Payer: Self-pay | Admitting: Family Medicine

## 2011-07-05 DIAGNOSIS — M2550 Pain in unspecified joint: Secondary | ICD-10-CM

## 2011-07-05 NOTE — Telephone Encounter (Signed)
Ok to refer for Rheum  for joint pains. Dr Dareen Piano.

## 2011-07-05 NOTE — Telephone Encounter (Signed)
She is requesting a referral to Dr. Azzie Roup for diffuse joint pains, especially in the hands

## 2011-07-05 NOTE — Telephone Encounter (Signed)
Order sent to [facility] 

## 2011-09-05 ENCOUNTER — Telehealth: Payer: Self-pay | Admitting: Family Medicine

## 2011-09-05 DIAGNOSIS — G5620 Lesion of ulnar nerve, unspecified upper limb: Secondary | ICD-10-CM | POA: Insufficient documentation

## 2011-09-05 MED ORDER — CELECOXIB 200 MG PO CAPS: 200.0000 mg | ORAL_CAPSULE | Freq: Two times a day (BID) | ORAL | Status: DC

## 2011-09-05 NOTE — Telephone Encounter (Signed)
Rx sent to pharmacy   

## 2011-09-05 NOTE — Telephone Encounter (Signed)
I don't see LFTS and renal function  in the EHR and she may have had this done elsewhere.    Ok to refill  For 6 months and plan for every 6 months lfts and yearly BMP.

## 2011-09-05 NOTE — Telephone Encounter (Signed)
Please refill Celebrex 200 mg bid for one year. Send in 90 days at a time to [facility]  Outpatient pharmacy

## 2011-10-12 ENCOUNTER — Telehealth: Payer: Self-pay | Admitting: Family Medicine

## 2011-10-12 DIAGNOSIS — G4700 Insomnia, unspecified: Secondary | ICD-10-CM

## 2011-10-12 MED ORDER — ZOLPIDEM TARTRATE 10 MG PO TABS: 10.0000 mg | ORAL_TABLET | Freq: Every evening | ORAL | Status: DC | PRN

## 2011-10-12 NOTE — Telephone Encounter (Signed)
Please refill Zolpidem for another 6 months

## 2011-10-12 NOTE — Telephone Encounter (Signed)
Rx called in to pharmacy. 

## 2011-10-12 NOTE — Telephone Encounter (Signed)
Ok to refill   For 6 months   90 with 1 refill

## 2011-10-19 ENCOUNTER — Ambulatory Visit: Payer: Self-pay | Admitting: Obstetrics and Gynecology

## 2011-11-24 ENCOUNTER — Encounter: Payer: Self-pay | Admitting: Obstetrics and Gynecology

## 2011-11-24 ENCOUNTER — Ambulatory Visit (INDEPENDENT_AMBULATORY_CARE_PROVIDER_SITE_OTHER): Payer: Managed Care, Other (non HMO) | Admitting: Obstetrics and Gynecology

## 2011-11-24 VITALS — BP 118/74 | Resp 16 | Ht 67.5 in | Wt 171.0 lb

## 2011-11-24 DIAGNOSIS — N6320 Unspecified lump in the left breast, unspecified quadrant: Secondary | ICD-10-CM

## 2011-11-24 DIAGNOSIS — N63 Unspecified lump in unspecified breast: Secondary | ICD-10-CM

## 2011-11-24 DIAGNOSIS — N841 Polyp of cervix uteri: Secondary | ICD-10-CM | POA: Insufficient documentation

## 2011-11-24 DIAGNOSIS — D259 Leiomyoma of uterus, unspecified: Secondary | ICD-10-CM | POA: Insufficient documentation

## 2011-11-24 DIAGNOSIS — Z01419 Encounter for gynecological examination (general) (routine) without abnormal findings: Secondary | ICD-10-CM

## 2011-11-24 DIAGNOSIS — E559 Vitamin D deficiency, unspecified: Secondary | ICD-10-CM | POA: Insufficient documentation

## 2011-11-24 NOTE — Progress Notes (Signed)
Contraception/ Vas/ Novasure Last pap 08/2010 Last Mammo 2012 Last Colonoscopy No Last Dexa Scan no Primary MD Dr Fabian Sharp Abuse at [facility] NO Pharmacy [facility] Outpt pharmacy.  Refer for Diagnostic Mammo Refer to GI for Colonoscopy  C/o SUI  Filed Vitals:   11/24/11 1005  BP: 118/74  Resp: 16   Physical Examination:  General appearance - alert, well appearing, and in no distress Neck - supple, no significant adenopathy, with slight nodularity on rt Chest - clear to auscultation, no wheezes, rales or rhonchi, symmetric air entry Heart - normal rate and regular rhythm Abdomen - soft, nontender, nondistended, no masses or organomegaly Breasts - breasts appear normal, no suspicious masses, no skin or nipple changes or axillary nodes on right.  + palpable mass on the left in upper outer quadrant at least 1cm Pelvic - normal external genitalia, vulva, vagina, cervix, uterus and adnexa Extremities - not examined  A/P H/o graves dz followed by Dr. Hortencia Pilar Breast mass  - sched diagnostic mammo Pap done Refer to GI for colonoscopy

## 2011-11-25 ENCOUNTER — Encounter: Payer: Self-pay | Admitting: Internal Medicine

## 2011-11-26 LAB — PAP IG W/ RFLX HPV ASCU

## 2011-11-28 ENCOUNTER — Other Ambulatory Visit: Payer: Self-pay

## 2011-11-28 ENCOUNTER — Telehealth: Payer: Self-pay

## 2011-11-28 ENCOUNTER — Telehealth: Payer: Self-pay | Admitting: Obstetrics and Gynecology

## 2011-11-28 NOTE — Telephone Encounter (Signed)
LM for pt to call re 2 referrals that we have scheduled for her. Christina [name] A

## 2011-11-28 NOTE — Telephone Encounter (Signed)
masg routed to [facility] this is [place] pt

## 2011-11-30 ENCOUNTER — Telehealth: Payer: Self-pay

## 2011-11-30 NOTE — Telephone Encounter (Signed)
Pt called back to state that she would prefer to go to [facility] for [facility]. This is fine. She also rec'd info re: her GI referral to Dr. Dickie La @ [facility].Melody Comas A

## 2011-12-01 ENCOUNTER — Telehealth: Payer: Self-pay

## 2011-12-01 NOTE — Telephone Encounter (Signed)
Spoke to pt who states she needs an order for a Diag. Bilateral mammogram faxed to  [facility] for her appt on 12/21/11. Order written and faxed to [phone]. Melody Comas A

## 2012-01-10 ENCOUNTER — Other Ambulatory Visit: Payer: Self-pay | Admitting: Internal Medicine

## 2012-01-18 ENCOUNTER — Encounter: Payer: Managed Care, Other (non HMO) | Admitting: Internal Medicine

## 2012-04-06 ENCOUNTER — Telehealth: Payer: Self-pay | Admitting: Family Medicine

## 2012-04-06 DIAGNOSIS — G4700 Insomnia, unspecified: Secondary | ICD-10-CM

## 2012-04-06 MED ORDER — ZOLPIDEM TARTRATE 10 MG PO TABS: 10.0000 mg | ORAL_TABLET | Freq: Every evening | ORAL | Status: DC | PRN

## 2012-04-06 NOTE — Telephone Encounter (Signed)
Called to the pharmacy and left on voicemail. 

## 2012-04-06 NOTE — Telephone Encounter (Signed)
Please refill Zolpidem for 6 months, thanks

## 2012-04-06 NOTE — Telephone Encounter (Signed)
Ok  To call in 90 days   To [facility] [place]. No refill.  Have her schedule a ROV in the meantime

## 2012-04-25 ENCOUNTER — Encounter: Payer: Self-pay | Admitting: Internal Medicine

## 2012-04-25 ENCOUNTER — Ambulatory Visit (INDEPENDENT_AMBULATORY_CARE_PROVIDER_SITE_OTHER): Payer: Managed Care, Other (non HMO) | Admitting: Internal Medicine

## 2012-04-25 VITALS — BP 108/72 | HR 81 | Temp 97.9°F | Wt 170.0 lb

## 2012-04-25 DIAGNOSIS — M79609 Pain in unspecified limb: Secondary | ICD-10-CM

## 2012-04-25 DIAGNOSIS — Z23 Encounter for immunization: Secondary | ICD-10-CM

## 2012-04-25 DIAGNOSIS — G4700 Insomnia, unspecified: Secondary | ICD-10-CM

## 2012-04-25 DIAGNOSIS — Z79899 Other long term (current) drug therapy: Secondary | ICD-10-CM

## 2012-04-25 DIAGNOSIS — E0590 Thyrotoxicosis, unspecified without thyrotoxic crisis or storm: Secondary | ICD-10-CM

## 2012-04-25 DIAGNOSIS — M79673 Pain in unspecified foot: Secondary | ICD-10-CM

## 2012-04-25 LAB — BASIC METABOLIC PANEL
CO2: 26 mEq/L (ref 19–32)
Calcium: 9.2 mg/dL (ref 8.4–10.5)
Chloride: 106 mEq/L (ref 96–112)
Glucose, Bld: 83 mg/dL (ref 70–99)
Potassium: 4 mEq/L (ref 3.5–5.1)
Sodium: 138 mEq/L (ref 135–145)

## 2012-04-25 LAB — HEPATIC FUNCTION PANEL
Bilirubin, Direct: 0.1 mg/dL (ref 0.0–0.3)
Total Bilirubin: 0.4 mg/dL (ref 0.3–1.2)

## 2012-04-25 MED ORDER — CELECOXIB 200 MG PO CAPS: 200.0000 mg | ORAL_CAPSULE | Freq: Every day | ORAL | Status: DC

## 2012-04-25 MED ORDER — ZOLPIDEM TARTRATE 10 MG PO TABS: 10.0000 mg | ORAL_TABLET | Freq: Every evening | ORAL | Status: DC | PRN

## 2012-04-25 NOTE — Patient Instructions (Addendum)
Will notify you  of labs when available. Call us if need pod referral.

## 2012-04-25 NOTE — Progress Notes (Signed)
  Subjective:    Patient ID: Christina [name], female    DOB: [birth year], 51 y.o.   MRN: [id number]  HPI Patient comes in today for follow up of  multiple medical problems.  And med management : since last visit she has seen rheum as discussed about her pain in  Feet and hands  Saw  Dr Dareen Piano.   DX  Cubital tunnel and had steroid shot with help.  Feet bothering her.   Wants to see podiatry cause of continued sx.  Hx for bunionectomy and mortons neuroma. celebrex helps some.  Once a day.   Changes  Synthroid  To 137   Avoiding vitamins.     Per Dr Talmage Nap Ambien every night :   Ever since thyroid dx   Hard to sleep without this no sig se  Review of Systems Neg fever cp sob bleeding.  Numbness  Arms beter at present has poss cubital tunnel syndrome.  Some swelling after plane flight  .   No pain or redness.   Past history family history social history reviewed in the electronic medical record. Outpatient Encounter Prescriptions as of 04/25/2012  Medication Sig Dispense Refill  . celecoxib (CELEBREX) 200 MG capsule Take 1 capsule (200 mg total) by mouth daily.  90 capsule  1  . cetirizine (ZYRTEC) 10 MG tablet Take 10 mg by mouth daily.        Marland Kitchen levothyroxine (SYNTHROID, LEVOTHROID) 137 MCG tablet Take 137 mcg by mouth daily.      . MULTIPLE VITAMIN PO Take by mouth.        . zolpidem (AMBIEN) 10 MG tablet Take 1 tablet (10 mg total) by mouth at bedtime as needed for sleep.  90 tablet  1  . DISCONTD: CELEBREX 200 MG capsule TAKE 1 CAPSULE BY MOUTH 2 TIMES DAILY.  180 capsule  0  . DISCONTD: celecoxib (CELEBREX) 200 MG capsule       . DISCONTD: levothyroxine (SYNTHROID, LEVOTHROID) 112 MCG tablet Take 112 mcg by mouth daily.        Marland Kitchen DISCONTD: zolpidem (AMBIEN) 10 MG tablet Take 1 tablet (10 mg total) by mouth at bedtime as needed for sleep.  90 tablet  0        Objective:   Physical Exam BP 108/72  Pulse 81  Temp 97.9 F (36.6 C) (Oral)  Wt 170 lb (77.111 kg)  SpO2 99%  WDWN in nad      Neck: Supple without adenopathy or masses or bruits Chest:  Clear to A&P without wheezes rales or rhonchi CV:  S1-S2 no gallops or murmurs peripheral perfusion is normal Abdomen:  Sof,t normal bowel sounds without hepatosplenomegaly, no guarding rebound or masses no CVA tenderness Feet  Slight puffiness left dorsal foot no bony tenderness .    Assessment & Plan:   Feet pain hx of surgery  ? mechanical problem   Call us with appt for pod and we can do referral if needed Sleep   Risk benefit of medication discussed. At this time needs to help get sleep. thyroid  Per Dr Talmage Nap  Stable.  Aware she needs colonoscopy.   Ongoing meds   Caution with renal/ liver effects appears to do much better on meds. Needs med monitoring

## 2012-04-29 ENCOUNTER — Encounter: Payer: Self-pay | Admitting: Internal Medicine

## 2012-04-29 DIAGNOSIS — M79673 Pain in unspecified foot: Secondary | ICD-10-CM | POA: Insufficient documentation

## 2012-04-29 DIAGNOSIS — Z79899 Other long term (current) drug therapy: Secondary | ICD-10-CM | POA: Insufficient documentation

## 2012-04-29 DIAGNOSIS — G4700 Insomnia, unspecified: Secondary | ICD-10-CM | POA: Insufficient documentation

## 2012-08-21 ENCOUNTER — Telehealth: Payer: Self-pay | Admitting: Family Medicine

## 2012-08-21 MED ORDER — AMOXICILLIN-POT CLAVULANATE 875-125 MG PO TABS: 1.0000 | ORAL_TABLET | Freq: Two times a day (BID) | ORAL | Status: DC

## 2012-08-21 NOTE — Telephone Encounter (Signed)
She has had symptoms of sinusitis for one week with HA, sinus pressure, blowing green out of the nose, ear pressure, and a dry cough. No fever. She has taken a few days of Avelox with no real change. Taking Mucinex bid. Please call in Augmentin [phone] to [facility] on [phone]

## 2012-08-21 NOTE — Telephone Encounter (Signed)
Agree as discussed   Sent in

## 2012-10-24 ENCOUNTER — Other Ambulatory Visit: Payer: Self-pay | Admitting: Dermatology

## 2012-12-21 ENCOUNTER — Telehealth: Payer: Self-pay | Admitting: Family Medicine

## 2012-12-21 DIAGNOSIS — G4700 Insomnia, unspecified: Secondary | ICD-10-CM

## 2012-12-21 MED ORDER — ZOLPIDEM TARTRATE 10 MG PO TABS: 10.0000 mg | ORAL_TABLET | Freq: Every evening | ORAL | Status: DC | PRN

## 2012-12-21 NOTE — Telephone Encounter (Signed)
Ok to do this please call this in  To pharmacy listed

## 2012-12-21 NOTE — Telephone Encounter (Signed)
She has an appt to see you on June 20. Please refill Zolpidem for 30 days until then. Thanks

## 2012-12-21 NOTE — Telephone Encounter (Signed)
Called to the pharmacy and left on voicemail. 

## 2013-01-25 ENCOUNTER — Ambulatory Visit (INDEPENDENT_AMBULATORY_CARE_PROVIDER_SITE_OTHER): Payer: Managed Care, Other (non HMO) | Admitting: Internal Medicine

## 2013-01-25 ENCOUNTER — Encounter: Payer: Self-pay | Admitting: Internal Medicine

## 2013-01-25 VITALS — BP 118/78 | HR 65 | Temp 97.6°F | Wt 170.0 lb

## 2013-01-25 DIAGNOSIS — E0500 Thyrotoxicosis with diffuse goiter without thyrotoxic crisis or storm: Secondary | ICD-10-CM

## 2013-01-25 DIAGNOSIS — Z79899 Other long term (current) drug therapy: Secondary | ICD-10-CM

## 2013-01-25 DIAGNOSIS — G4700 Insomnia, unspecified: Secondary | ICD-10-CM

## 2013-01-25 MED ORDER — ZOLPIDEM TARTRATE 10 MG PO TABS: ORAL_TABLET | ORAL | Status: DC

## 2013-01-25 NOTE — Patient Instructions (Addendum)
Try 1/2 pill  5 mg as discussed and can repeat  In a few hours.

## 2013-01-25 NOTE — Progress Notes (Signed)
Chief Complaint  Patient presents with  . Follow-up    HPI: Here for followup of medication evaluation. Since her last visit she has been quite healthy. Hasn't gotten a colonoscopy yet would like to wait until next year because of obligations. On zolpidem  Every night    For trying.  and hard to get off.  Not taking celebrex anymore not that helpful.  No major change in health status since last visit . Thyroid check  Nl in may    Now on 150   Yearly check  At this tppoint.  ROS: See pertinent positives and negatives per HPI.  Past Medical History  Diagnosis Date  . Allergic rhinitis   . Hyperthyroidism     s/p rai 8/06 now hypothyroid Dr. Horald Pollen  . Grave's disease   . Hypothyroid   . H/O menorrhagia 2012  . Vitamin D deficiency   . Fibroid   . H/O insomnia   . Infertility, female   . H/O Graves' disease   . History of chicken pox   . Menorrhagia     Had Novasure  . Polyp of cervix     Endometriel polyp. JO  . Fibroids   . Vitamin D deficiency     Family History  Problem Relation Age of Onset  . Diabetes Mother   . Hypertension    . Prostate cancer      grandfather  . Hypertension Father     History   Social History  . Marital Status: Married    Spouse Name: N/A    Number of Children: N/A  . Years of Education: N/A   Social History Main Topics  . Smoking status: Never Smoker   . Smokeless tobacco: None  . Alcohol Use: Yes     Comment: socially  . Drug Use: None  . Sexually Active: Yes   Other Topics Concern  . 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 cetirizine (ZYRTEC) 10 MG tablet Take 10 mg by mouth daily.        Marland Kitchen levothyroxine (SYNTHROID, LEVOTHROID) 150 MCG tablet Take 150 mcg by mouth daily before breakfast.      . MULTIPLE VITAMIN PO Take by mouth.        . zolpidem (AMBIEN) 10 MG tablet Take  1/2 to 1 po qd at night as needed.  90 tablet  1  . [DISCONTINUED] levothyroxine (SYNTHROID, LEVOTHROID) 137 MCG tablet Take 137 mcg by mouth daily.      . [DISCONTINUED] zolpidem (AMBIEN) 10 MG tablet Take 1 tablet (10 mg total) by mouth at bedtime as needed for sleep.  30 tablet  0  . [DISCONTINUED] zolpidem (AMBIEN) 10 MG tablet Take 1/2 to 1 po qd at night as needed.  90 tablet  0  . [DISCONTINUED] amoxicillin-clavulanate (AUGMENTIN) 875-125 MG per tablet Take 1 tablet by mouth every 12 (twelve) hours.  20 tablet  0  . [DISCONTINUED] celecoxib (CELEBREX) 200 MG capsule Take 1 capsule (200 mg total) by mouth daily.  90 capsule  1   No facility-administered encounter medications on file as of 01/25/2013.    EXAM:  BP 118/78  Pulse 65  Temp(Src) 97.6 F (36.4 C) (Oral)  Wt 170 lb (77.111 kg)  BMI 26.22 kg/m2  SpO2 98%  Body mass index is  26.22 kg/(m^2).  GENERAL: vitals reviewed and listed above, alert, oriented, appears well hydrated and in no acute distress HEENT: atraumatic, conjunctiva  clear, no obvious abnormalities on inspection of external nose and ears  Neck no nodules  LUNGS: clear to auscultation bilaterally, no wheezes, rales or rhonchi, good air movement CV: HRRR, no clubbing cyanosis or  peripheral edema nl cap refill  Abdomen:  Sof,t normal bowel sounds without hepatosplenomegaly, no guarding rebound or masses no CVA tenderness MS: moves all extremities without noticeable focal  abnormality PSYCH: pleasant and cooperative, no obvious depression or anxiety  ASSESSMENT AND PLAN:  Discussed the following assessment and plan:  Insomnia - Plan: zolpidem (AMBIEN) 10 MG tablet, DISCONTINUED: zolpidem (AMBIEN) 10 MG tablet  GRAVE'S DISEASE  Medication management Discussed risk benefit again of the zolpidem patient's team the way her tries to go off at times it is difficult we'll try to 5 mg and add another 5 days she goes along. At this point we'll refill her medicine  #90 refill x1 are OV in 6-9 months depending on how she is doing. She will get colonoscopy in 2015. Thyroid disease is stable at present. Off the Celebrex it is wasn't helpful risk more than benefit. -Patient advised to return or notify health care team  if symptoms worsen or persist or new concerns arise.  Patient Instructions  Try 1/2 pill  5 mg as discussed and can repeat  In a few hours.      Neta Mends. Jonesha Tsuchiya M.D. Health Maintenance  Topic Date Due  . Tetanus/tdap  07/06/1979  . Colonoscopy  07/05/2010  . Influenza Vaccine  04/08/2013  . Mammogram  05/24/2013  . Pap Smear  11/24/2014   Health Maintenance Review Plans to do colon  In 2015 .

## 2013-08-09 ENCOUNTER — Ambulatory Visit (INDEPENDENT_AMBULATORY_CARE_PROVIDER_SITE_OTHER): Payer: Managed Care, Other (non HMO) | Admitting: Internal Medicine

## 2013-08-09 ENCOUNTER — Encounter: Payer: Self-pay | Admitting: Internal Medicine

## 2013-08-09 VITALS — BP 136/84 | HR 82 | Temp 98.5°F | Wt 175.0 lb

## 2013-08-09 DIAGNOSIS — Z1211 Encounter for screening for malignant neoplasm of colon: Secondary | ICD-10-CM

## 2013-08-09 DIAGNOSIS — G4700 Insomnia, unspecified: Secondary | ICD-10-CM

## 2013-08-09 MED ORDER — ZOLPIDEM TARTRATE 10 MG PO TABS: ORAL_TABLET | ORAL | Status: DC

## 2013-08-09 NOTE — Progress Notes (Signed)
Chief Complaint  Patient presents with  . Follow-up    HPI: Patient comes in for followup of medication management. She is still taking Ambien 5 mg most times but during stressful season has been taking 10 mg with help. She was able to decrease the dose in the summer. She is up-to-date on her mammogram. Hasn't been able to get a colonoscopy because of bariatric scheduled busy at work with people out. Sees GYN and Dr. Chalmers Cater for her thyroid stable dosing. Get flu vaccine every year Orthopedic issues are stable.  ROS: See pertinent positives and negatives per HPI.  Past Medical History  Diagnosis Date  . Allergic rhinitis   . Hyperthyroidism     s/p rai 8/06 now hypothyroid Dr. Suzette Battiest  . Grave's disease   . Hypothyroid   . H/O menorrhagia 2012  . Vitamin D deficiency   . Fibroid   . H/O insomnia   . Infertility, female   . H/O Graves' disease   . History of chicken pox   . Menorrhagia     Had Novasure  . Polyp of cervix     Endometriel polyp. JO  . Fibroids   . Vitamin D deficiency     Family History  Problem Relation Age of Onset  . Diabetes Mother   . Hypertension    . Prostate cancer      grandfather  . Hypertension Father     History   Social History  . Marital Status: Married    Spouse Name: N/A    Number of Children: N/A  . Years of Education: N/A   Social History Main Topics  . Smoking status: Never Smoker   . Smokeless tobacco: None  . Alcohol Use: Yes     Comment: socially  . Drug Use: None  . Sexual Activity: Yes   Other Topics Concern  . 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 levothyroxine (SYNTHROID, LEVOTHROID) 150 MCG tablet Take 150 mcg by mouth daily before breakfast.  . zolpidem (AMBIEN) 10 MG tablet Take 1/2 to 1 po qd at night as needed.  . [DISCONTINUED] zolpidem (AMBIEN) 10 MG tablet Take 1/2 to 1  po qd at night as needed.  . cetirizine (ZYRTEC) 10 MG tablet Take 10 mg by mouth daily.    . [DISCONTINUED] MULTIPLE VITAMIN PO Take by mouth.      EXAM:  BP 136/84  Pulse 82  Temp(Src) 98.5 F (36.9 C) (Oral)  Wt 175 lb (79.379 kg)  SpO2 98%  Body mass index is 26.99 kg/(m^2).  GENERAL: vitals reviewed and listed above, alert, oriented, appears well hydrated and in no acute distress  HEENT: atraumatic, conjunctiva  clear, no obvious abnormalities on inspection of external nose and ears OP : no lesion edema or exudate  NECK: no obvious masses on inspection palpation  LUNGS: clear to auscultation bilaterally, no wheezes, rales or rhonchi, good air movement CV: HRRR, no clubbing cyanosis or  peripheral edema nl cap refill  MS: moves all extremities without noticeable focal  abnormality Abdomen:  Sof,t normal bowel sounds without hepatosplenomegaly, no guarding rebound or masses no CVA tenderness PSYCH: pleasant and cooperative, no obvious depression or anxiety  ASSESSMENT AND PLAN:  Discussed the following assessment and plan:  Insomnia - Plan: zolpidem (AMBIEN) 10 MG tablet  Special screening  for malignant neoplasms, colon - Plan: Fecal occult blood, imunochemical Discussed stool testing until she has time to do colonoscopy. Up to date otherwise on health care maintenance  To sign controlled substance contract low risk lowest dose possible refill medication at this time. -Patient advised to return or notify health care team  if symptoms worsen or persist or new concerns arise.  Patient Instructions  Do stool screening for colon cancer until you can get colonoscopy. medication refill as discussed . Keep utd on mammogram  Make sure dr Chalmers Cater sends [place] copies of your thyroid evaluations.  Wellness or OV in 6 months    Standley Brooking. Panosh M.D.  Pre visit review using our clinic review tool, if applicable. No additional management support is needed unless otherwise documented  below in the visit note.

## 2013-08-09 NOTE — Patient Instructions (Signed)
Do stool screening for colon cancer until you can get colonoscopy. medication refill as discussed . Keep utd on mammogram  Make sure dr Chalmers Cater sends [place] copies of your thyroid evaluations.  Wellness or OV in 6 months

## 2013-08-15 ENCOUNTER — Other Ambulatory Visit: Payer: Managed Care, Other (non HMO)

## 2013-10-17 LAB — FECAL OCCULT BLOOD, IMMUNOCHEMICAL: FECAL OCCULT BLD: NEGATIVE

## 2013-10-18 ENCOUNTER — Encounter: Payer: Self-pay | Admitting: Family Medicine

## 2013-12-03 ENCOUNTER — Encounter: Payer: Self-pay | Admitting: Internal Medicine

## 2013-12-03 ENCOUNTER — Ambulatory Visit (INDEPENDENT_AMBULATORY_CARE_PROVIDER_SITE_OTHER): Payer: Managed Care, Other (non HMO) | Admitting: Internal Medicine

## 2013-12-03 ENCOUNTER — Encounter: Payer: Self-pay | Admitting: *Deleted

## 2013-12-03 ENCOUNTER — Ambulatory Visit (INDEPENDENT_AMBULATORY_CARE_PROVIDER_SITE_OTHER)
Admission: RE | Admit: 2013-12-03 | Discharge: 2013-12-03 | Disposition: A | Discharge status: Home / Self Care | Payer: Managed Care, Other (non HMO) | Source: Ambulatory Visit | Attending: Internal Medicine | Admitting: Internal Medicine

## 2013-12-03 VITALS — BP 142/82 | Temp 98.0°F | Ht 67.75 in | Wt 172.0 lb

## 2013-12-03 DIAGNOSIS — Z8639 Personal history of other endocrine, nutritional and metabolic disease: Secondary | ICD-10-CM

## 2013-12-03 DIAGNOSIS — Z862 Personal history of diseases of the blood and blood-forming organs and certain disorders involving the immune mechanism: Secondary | ICD-10-CM

## 2013-12-03 DIAGNOSIS — R0602 Shortness of breath: Secondary | ICD-10-CM

## 2013-12-03 DIAGNOSIS — R030 Elevated blood-pressure reading, without diagnosis of hypertension: Secondary | ICD-10-CM

## 2013-12-03 LAB — BASIC METABOLIC PANEL
BUN: 18 mg/dL (ref 6–23)
CO2: 25 meq/L (ref 19–32)
CREATININE: 0.7 mg/dL (ref 0.4–1.2)
Calcium: 9.5 mg/dL (ref 8.4–10.5)
Chloride: 103 mEq/L (ref 96–112)
GFR: 88.5 mL/min (ref >60.00)
Glucose, Bld: 82 mg/dL (ref 70–99)
Potassium: 3.7 mEq/L (ref 3.5–5.1)
SODIUM: 136 meq/L (ref 135–145)

## 2013-12-03 LAB — T3, FREE: T3, Free: 2.9 pg/mL (ref 2.3–4.2)

## 2013-12-03 LAB — TSH: TSH: 3.6 u[IU]/mL (ref 0.35–5.50)

## 2013-12-03 LAB — T4, FREE: FREE T4: 0.95 ng/dL (ref 0.60–1.60)

## 2013-12-03 IMAGING — CR DG CHEST 2V
2 series · 2 of 2 positions shown · non-contrast
Comparison: None.

CLINICAL DATA: Shortness of breath, chest tightness

EXAM:
CHEST  2 VIEW

[view not recorded (1 of 2)]
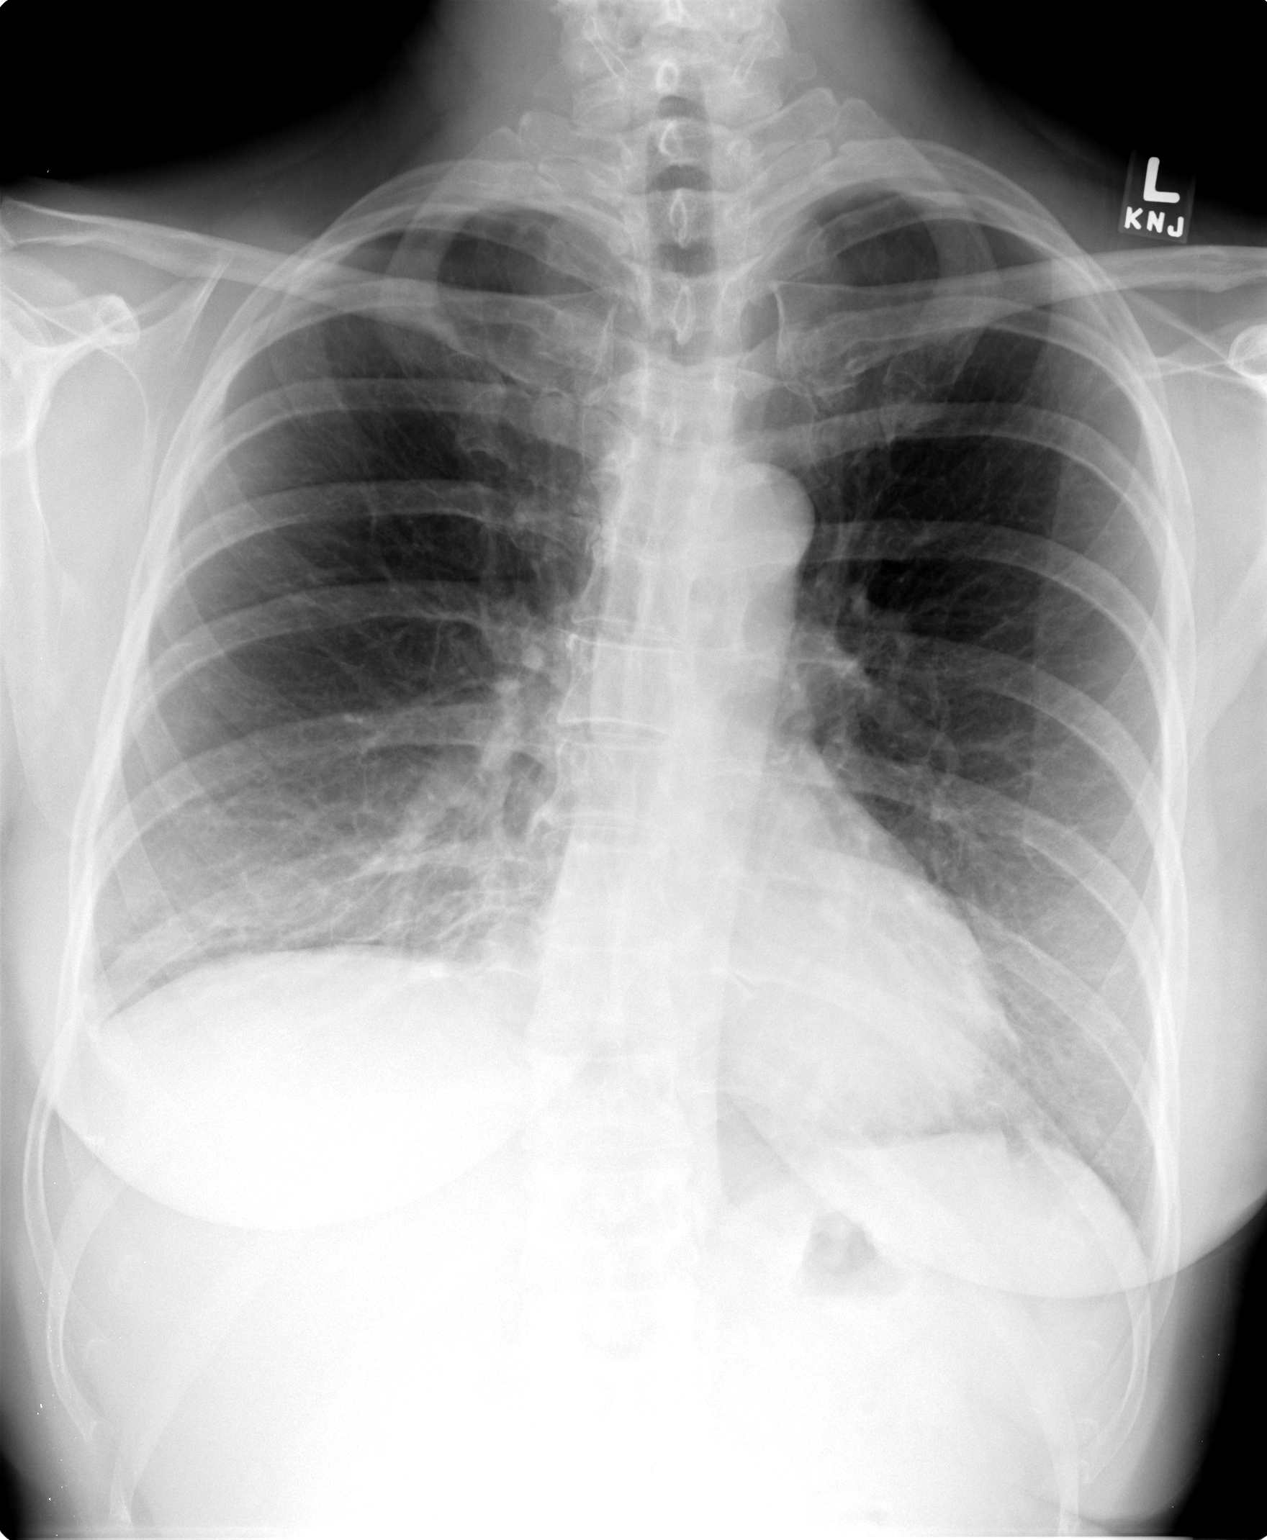

[view not recorded (2 of 2)]
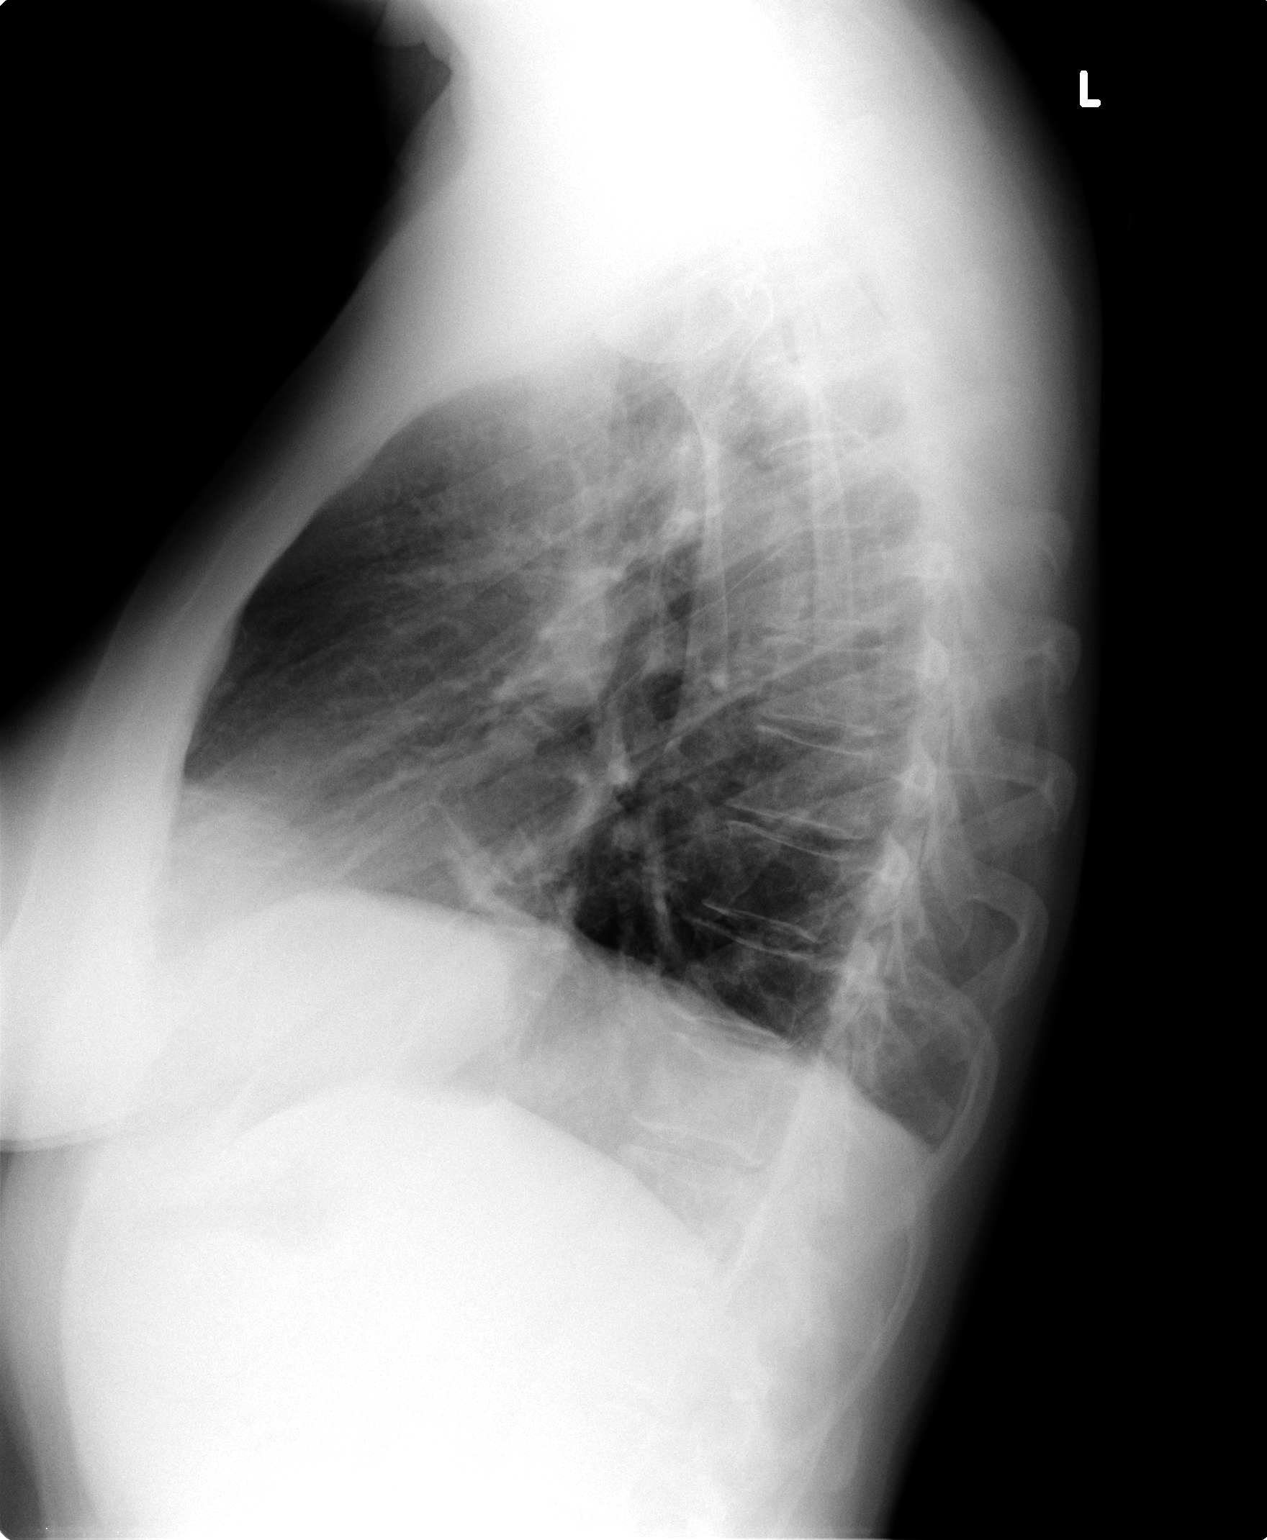

[2 of 2 positions shown; findings below may reference images not displayed]

FINDINGS: Mild bibasilar opacities, likely atelectasis. Mild elevation right
hemidiaphragm. No pleural effusion or pneumothorax.

Heart is normal in size.

Visualized osseous structures are within normal limits.
IMPRESSION: No evidence of acute cardiopulmonary disease.

## 2013-12-03 NOTE — Patient Instructions (Addendum)
Will notify you  of labs when available. Get chest x ray . We may get an echo test of heart depending on labs.  This could be stress related but sometimes do more evaluation to  Decide if need further work up. Check your pulse rate if getting fast.  Check bp readings at home to make sure below 140/90  How to Take a Pulse A common spot to measure your pulse is the artery on the thumb-side of the wrist. This pulse is the pressure increase in the radial artery. This happens every time the heart beats.  To find the pulse, lay one hand down with the palm up. With the index finger and middle finger of the other hand, feel the soft area on the arm about an inch from the wrist and just inside the bone on the thumb side. Touch this area lightly and add more and more pressure until you feel your pulse/heart beat. Count the amount of heart beats for 15 seconds and multiply by four. This will give you your pulse rate. You may also count your pulse for one minute to get the rate. Digital watches or those with second hands can be used. After locating the radial pulse, begin your count with "zero" on the starting time mark; then count the pulses for the desired time length. Another pulse frequently checked is the carotid pulse. This pulse is taken in a place just below the jaw between the voice box and the large muscle on the front and side of the neck. Use the fingertips of the index and middle fingers to press gently. Do not move your fingers around in a massaging motion while trying to find your carotid pulse. This can lower your blood pressure and cause dizziness. The same counting systems used for the radial pulse check can be used for the carotid pulse check.  The pulse differs from person to person. The "normal" for men is 72 bpm (beats per minute). For women the normal is 80 bpm. Rates vary considerable depending on the condition of a person. For example, a marathon runner will have a very low pulse rate. The  person with heart failure may have a high pulse rate even at rest. Keep a record of your pulse rates and record any irregularities. The pulse should be regular with evenly spaced beats. If you are trying to improve your fitness, the pulse will be a good guide how you are doing in your fitness training. Generally as your fitness improves, if there are no other health problems, your pulse will gradually become less with activities. As you achieve a higher fitness, your pulse rate is sure to drop. Measure it at the same time every day or at the same intervals following a workout. People on heart medications or have a pacemaker should keep a log of their exercise pulse checks. This information will help determine the needs and effectiveness of your treatment. Bring this with you when seen by your caregiver. Pulse monitors are available at most fitness and athletic supply centers. They allow you to monitor your pulse by a digital display of the heart rate (pulse) closely during all activities. Document Released: 01/29/2003 Document Revised: 10/17/2011 Document Reviewed: 07/25/2005 [facility] Patient Information 2014 [place].

## 2013-12-03 NOTE — Progress Notes (Signed)
Chief Complaint  Patient presents with  . Shortness of Breath    Patient believes this is due to stress.  . Chest tightness    HPI: Patient comes in today  for  new problem evaluation.  Shortness of breath  conmes and goes.  Since December   Last Not before like this.  ertsimated 20 minutes .  Sob  Worse recnetly. No cough or wheeze. Can walk 35 mintues .   Was sob up starirs. But this could be deconditioning. She thinks the whole thing could be stress but is uncertain.  Hs allergy sx  No related.  No history of asthma or coughing but has risk takes Zyrtec as needed  Chest pain tight ness.  Some hurt uncomfortable noticeable like a weight   Triggers ? Unkown.  Poss stress or not stress.  ocass night ocass. Symptoms and episodes.  Poss fast rate  Of heart.   Having etpisodes at work . But doesn't know how to check her pulse  Maternal. No history of premature heart disease.   Thyroid  is stable. At this time. ROS: See pertinent positives and negatives per HPI. Negative syncope unusual bleeding specific panic attack other heart or lung problems change in bowel habits. Blood pressure usually good except maybe when she goes to the doctor.  Past Medical History  Diagnosis Date  . Allergic rhinitis   . Hyperthyroidism     s/p rai 8/06 now hypothyroid Dr. Suzette Battiest  . Grave's disease   . Hypothyroid   . H/O menorrhagia 2012  . Vitamin D deficiency   . Fibroid   . H/O insomnia   . Infertility, female   . H/O Graves' disease   . History of chicken pox   . Menorrhagia     Had Novasure  . Polyp of cervix     Endometriel polyp. JO  . Fibroids   . Vitamin D deficiency     Family History  Problem Relation Age of Onset  . Diabetes Mother   . Hypertension    . Prostate cancer      grandfather  . Hypertension Father     History   Social History  . Marital Status: Married    Spouse Name: N/A    Number of Children: N/A  . Years of Education: N/A   Social History Main  Topics  . Smoking status: Never Smoker   . Smokeless tobacco: None  . Alcohol Use: Yes     Comment: socially  . Drug Use: None  . Sexual Activity: Yes   Other Topics Concern  . None   Social History Narrative   Married   HH of 4   Audiologist working at [employer]    Outpatient Encounter Prescriptions as of 12/03/2013  Medication Sig  . cetirizine (ZYRTEC) 10 MG tablet Take 10 mg by mouth daily.    Marland Kitchen levothyroxine (SYNTHROID, LEVOTHROID) 150 MCG tablet Take 150 mcg by mouth daily before breakfast.  . zolpidem (AMBIEN) 10 MG tablet Take 1/2 to 1 po qd at night as needed.    EXAM:  BP 142/82  Temp(Src) 98 F (36.7 C) (Oral)  Ht 5' 7.75" (1.721 m)  Wt 172 lb (78.019 kg)  BMI 26.34 kg/m2  Body mass index is 26.34 kg/(m^2).  GENERAL: vitals reviewed and listed above, alert, oriented, appears well hydrated and in no acute distress HEENT: atraumatic, conjunctiva  clear, no obvious abnormalities on inspection of external nose and ears OP :  no lesion edema or exudate  NECK: no obvious masses on inspection palpation  LUNGS: clear to auscultation bilaterally, no wheezes, rales or rhonchi, good air movement CV: HRRR, no clubbing cyanosis or  peripheral edema nl cap refill  Abdomen:  Sof,t normal bowel sounds without hepatosplenomegaly, no guarding rebound or masses no CVA tenderness MS: moves all extremities without noticeable focal  abnormality PSYCH: pleasant and cooperative, no obvious depression or anxiety EKG shows normal sinus rhythm except for nonspecific T-wave flattening throughout. Intervals appear to be normal ASSESSMENT AND PLAN:  Discussed the following assessment and plan:  Shortness of breath - episodic some inc hr poss anxiety but r/o thy resp cv etc  - Plan: EKG [id number], Basic metabolic panel, TSH, T4, free, T3, free, DG Chest 2 View  Hx of Graves' disease - Plan: Basic metabolic panel, TSH, T4, free, T3, free, DG Chest 2 View  Insomnia This could  well be anxiety still consider arrhythmia tachycardia asthmatic. Consider further workup such as pulmonary function an echocardiogram but at this point would just have her take labs and a chest x-ray and close followup is warranted. She agrees she doesn't want to take medicine for stress has done yoga in the past with success we'll look into exercise as possibility. Plan followup depending on labs and how she is doing discussed significance of rapid pulse rates. -Patient advised to return or notify health care team  if symptoms worsen ,persist or new concerns arise.  Patient Instructions  Will notify you  of labs when available. Get chest x ray . We may get an echo test of heart depending on labs.  This could be stress related but sometimes do more evaluation to  Decide if need further work up. Check your pulse rate if getting fast.  Check bp readings at home to make sure below 140/90  How to Take a Pulse A common spot to measure your pulse is the artery on the thumb-side of the wrist. This pulse is the pressure increase in the radial artery. This happens every time the heart beats.  To find the pulse, lay one hand down with the palm up. With the index finger and middle finger of the other hand, feel the soft area on the arm about an inch from the wrist and just inside the bone on the thumb side. Touch this area lightly and add more and more pressure until you feel your pulse/heart beat. Count the amount of heart beats for 15 seconds and multiply by four. This will give you your pulse rate. You may also count your pulse for one minute to get the rate. Digital watches or those with second hands can be used. After locating the radial pulse, begin your count with "zero" on the starting time mark; then count the pulses for the desired time length. Another pulse frequently checked is the carotid pulse. This pulse is taken in a place just below the jaw between the voice box and the large muscle on the front  and side of the neck. Use the fingertips of the index and middle fingers to press gently. Do not move your fingers around in a massaging motion while trying to find your carotid pulse. This can lower your blood pressure and cause dizziness. The same counting systems used for the radial pulse check can be used for the carotid pulse check.  The pulse differs from person to person. The "normal" for men is 72 bpm (beats per minute). For women the normal is  80 bpm. Rates vary considerable depending on the condition of a person. For example, a marathon runner will have a very low pulse rate. The person with heart failure may have a high pulse rate even at rest. Keep a record of your pulse rates and record any irregularities. The pulse should be regular with evenly spaced beats. If you are trying to improve your fitness, the pulse will be a good guide how you are doing in your fitness training. Generally as your fitness improves, if there are no other health problems, your pulse will gradually become less with activities. As you achieve a higher fitness, your pulse rate is sure to drop. Measure it at the same time every day or at the same intervals following a workout. People on heart medications or have a pacemaker should keep a log of their exercise pulse checks. This information will help determine the needs and effectiveness of your treatment. Bring this with you when seen by your caregiver. Pulse monitors are available at most fitness and athletic supply centers. They allow you to monitor your pulse by a digital display of the heart rate (pulse) closely during all activities. Document Released: 01/29/2003 Document Revised: 10/17/2011 Document Reviewed: 07/25/2005 [facility] Patient Information 2014 [place].      Standley Brooking. Kevionna Heffler M.D.  Pre visit review using our clinic review tool, if applicable. No additional management support is needed unless otherwise documented below in the visit note.

## 2013-12-04 ENCOUNTER — Encounter: Payer: Self-pay | Admitting: Family Medicine

## 2014-01-20 ENCOUNTER — Telehealth: Payer: Self-pay | Admitting: Internal Medicine

## 2014-01-20 ENCOUNTER — Telehealth: Payer: Self-pay | Admitting: Family Medicine

## 2014-01-20 DIAGNOSIS — G4700 Insomnia, unspecified: Secondary | ICD-10-CM

## 2014-01-20 MED ORDER — ZOLPIDEM TARTRATE 10 MG PO TABS: ORAL_TABLET | ORAL | Status: DC

## 2014-01-20 NOTE — Telephone Encounter (Signed)
Please call/send in. Thanks!

## 2014-01-20 NOTE — Telephone Encounter (Signed)
Pharm calling b/c they received message for pt' s zolpidem (AMBIEN) 10 MG tablet, but did not get the mg amount. Request a cb w/ this info pls

## 2014-01-20 NOTE — Telephone Encounter (Signed)
Since Dr. Regis Bill is out of the office, please call in #30 of Zolpidem 10 mg to [facility] on [address]. Thanks

## 2014-01-20 NOTE — Telephone Encounter (Signed)
I called the pharmacy and advised the pharmacy tech the Rx should be for 10mg .

## 2014-01-20 NOTE — Telephone Encounter (Signed)
I called the Rx in to [facility] at [phone] and Dr Sarajane Jews is aware.

## 2014-02-17 ENCOUNTER — Telehealth: Payer: Self-pay | Admitting: Family Medicine

## 2014-02-17 DIAGNOSIS — G4700 Insomnia, unspecified: Secondary | ICD-10-CM

## 2014-02-17 MED ORDER — ZOLPIDEM TARTRATE 10 MG PO TABS: ORAL_TABLET | ORAL | Status: DC

## 2014-02-17 MED ORDER — LEVOTHYROXINE SODIUM 150 MCG PO TABS: 150.0000 ug | ORAL_TABLET | Freq: Every day | ORAL | Status: DC

## 2014-02-17 NOTE — Telephone Encounter (Signed)
Please rx synthroid 11 months  Due for lab by then . Ok to rx 6 months of the [place]

## 2014-02-17 NOTE — Telephone Encounter (Signed)
Could you please refill Synthroid for one year and Zolpidem for 6 months? She was seen in April and had labs drawn then. Thank you

## 2014-02-17 NOTE — Telephone Encounter (Signed)
Synthroid sent by e-scribe and Ambien called and left on voicemail.

## 2014-05-12 ENCOUNTER — Telehealth: Payer: Self-pay | Admitting: Family Medicine

## 2014-05-12 DIAGNOSIS — G4700 Insomnia, unspecified: Secondary | ICD-10-CM

## 2014-05-12 NOTE — Telephone Encounter (Signed)
Ok to rx 90 days refill x 1 ambien  Refill 6 months supply 90 days at a time  For synthroid  Due for rov and lab tsh in February  6 months

## 2014-05-12 NOTE — Telephone Encounter (Signed)
Can you please convert her rx for Synthroid and for Zolpidem to 90 day prescriptions with refills and call these in to [place]. Thank you

## 2014-05-13 MED ORDER — LEVOTHYROXINE SODIUM 150 MCG PO TABS: 150.0000 ug | ORAL_TABLET | Freq: Every day | ORAL | Status: DC

## 2014-05-13 MED ORDER — ZOLPIDEM TARTRATE 10 MG PO TABS: ORAL_TABLET | ORAL | Status: DC

## 2014-05-13 NOTE — Telephone Encounter (Signed)
Ambien called to the pharmacy.  Levothyroxine sent by e-scribe.

## 2014-05-15 ENCOUNTER — Other Ambulatory Visit: Payer: Self-pay | Admitting: Family Medicine

## 2014-05-15 MED ORDER — LEVOTHYROXINE SODIUM 150 MCG PO TABS: 150.0000 ug | ORAL_TABLET | Freq: Every day | ORAL | Status: DC

## 2014-07-01 ENCOUNTER — Other Ambulatory Visit: Payer: Self-pay | Admitting: Family Medicine

## 2014-07-01 ENCOUNTER — Telehealth: Payer: Self-pay | Admitting: Family Medicine

## 2014-07-01 MED ORDER — SYNTHROID 150 MCG PO TABS: 150.0000 ug | ORAL_TABLET | Freq: Every day | ORAL | Status: DC

## 2014-07-01 NOTE — Telephone Encounter (Signed)
Please refill her Synthroid 150 mcg daily with NAME BRAND ONLY. Please send 90 day refills to [place]

## 2014-07-01 NOTE — Telephone Encounter (Signed)
Sent to the pharmacy by e-scribe. 

## 2014-08-08 ENCOUNTER — Encounter (HOSPITAL_COMMUNITY): Payer: Self-pay | Admitting: Physical Medicine and Rehabilitation

## 2014-08-08 ENCOUNTER — Emergency Department (HOSPITAL_COMMUNITY): Payer: Managed Care, Other (non HMO)

## 2014-08-08 ENCOUNTER — Emergency Department (HOSPITAL_COMMUNITY)
Admission: EM | Admit: 2014-08-08 | Discharge: 2014-08-08 | Disposition: A | Discharge status: Home / Self Care | Payer: Managed Care, Other (non HMO) | Attending: Emergency Medicine | Admitting: Emergency Medicine

## 2014-08-08 DIAGNOSIS — Z8619 Personal history of other infectious and parasitic diseases: Secondary | ICD-10-CM | POA: Diagnosis not present

## 2014-08-08 DIAGNOSIS — E039 Hypothyroidism, unspecified: Secondary | ICD-10-CM | POA: Diagnosis not present

## 2014-08-08 DIAGNOSIS — R51 Headache: Secondary | ICD-10-CM

## 2014-08-08 DIAGNOSIS — Z8742 Personal history of other diseases of the female genital tract: Secondary | ICD-10-CM | POA: Diagnosis not present

## 2014-08-08 DIAGNOSIS — I10 Essential (primary) hypertension: Secondary | ICD-10-CM | POA: Insufficient documentation

## 2014-08-08 DIAGNOSIS — Z791 Long term (current) use of non-steroidal anti-inflammatories (NSAID): Secondary | ICD-10-CM | POA: Diagnosis not present

## 2014-08-08 DIAGNOSIS — Z79899 Other long term (current) drug therapy: Secondary | ICD-10-CM | POA: Insufficient documentation

## 2014-08-08 DIAGNOSIS — Z862 Personal history of diseases of the blood and blood-forming organs and certain disorders involving the immune mechanism: Secondary | ICD-10-CM | POA: Diagnosis not present

## 2014-08-08 DIAGNOSIS — E0590 Thyrotoxicosis, unspecified without thyrotoxic crisis or storm: Secondary | ICD-10-CM | POA: Insufficient documentation

## 2014-08-08 DIAGNOSIS — I160 Hypertensive urgency: Secondary | ICD-10-CM

## 2014-08-08 DIAGNOSIS — G4700 Insomnia, unspecified: Secondary | ICD-10-CM | POA: Insufficient documentation

## 2014-08-08 DIAGNOSIS — R519 Headache, unspecified: Secondary | ICD-10-CM

## 2014-08-08 LAB — URINALYSIS, ROUTINE W REFLEX MICROSCOPIC
Bilirubin Urine: NEGATIVE
Glucose, UA: NEGATIVE mg/dL
Ketones, ur: 15 mg/dL — AB
LEUKOCYTES UA: NEGATIVE
NITRITE: NEGATIVE
Protein, ur: NEGATIVE mg/dL
Specific Gravity, Urine: 1.015 (ref 1.005–1.030)
Urobilinogen, UA: 0.2 mg/dL (ref 0.0–1.0)
pH: 6.5 (ref 5.0–8.0)

## 2014-08-08 LAB — BASIC METABOLIC PANEL
Anion gap: 11 (ref 5–15)
BUN: 11 mg/dL (ref 6–23)
CHLORIDE: 103 meq/L (ref 96–112)
CO2: 23 mmol/L (ref 19–32)
Calcium: 9 mg/dL (ref 8.4–10.5)
Creatinine, Ser: 0.73 mg/dL (ref 0.50–1.10)
GFR calc Af Amer: >90 mL/min (ref >90)
GFR calc non Af Amer: >90 mL/min (ref >90)
Glucose, Bld: 97 mg/dL (ref 70–99)
Potassium: 3.4 mmol/L — ABNORMAL LOW (ref 3.5–5.1)
Sodium: 137 mmol/L (ref 135–145)

## 2014-08-08 LAB — CBC WITH DIFFERENTIAL/PLATELET
BASOS ABS: 0.8 10*3/uL — AB (ref 0.0–0.1)
Basophils Relative: 6 % — ABNORMAL HIGH (ref 0–1)
EOS PCT: 4 % (ref 0–5)
Eosinophils Absolute: 0.5 10*3/uL (ref 0.0–0.7)
HCT: 43 % (ref 36.0–46.0)
Hemoglobin: 14.5 g/dL (ref 12.0–15.0)
Lymphocytes Relative: 21 % (ref 12–46)
Lymphs Abs: 2.6 10*3/uL (ref 0.7–4.0)
MCH: 30.5 pg (ref 26.0–34.0)
MCHC: 33.7 g/dL (ref 30.0–36.0)
MCV: 90.5 fL (ref 78.0–100.0)
MONOS PCT: 4 % (ref 3–12)
Monocytes Absolute: 0.5 10*3/uL (ref 0.1–1.0)
NEUTROS ABS: 8.2 10*3/uL — AB (ref 1.7–7.7)
Neutrophils Relative %: 65 % (ref 43–77)
Platelets: 365 10*3/uL (ref 150–400)
RBC: 4.75 MIL/uL (ref 3.87–5.11)
RDW: 13.6 % (ref 11.5–15.5)
WBC: 12.6 10*3/uL — ABNORMAL HIGH (ref 4.0–10.5)

## 2014-08-08 LAB — URINE MICROSCOPIC-ADD ON

## 2014-08-08 LAB — TSH: TSH: 0.614 u[IU]/mL (ref 0.350–4.500)

## 2014-08-08 IMAGING — CT CT HEAD W/O CM
1 series · 16 of 30 positions shown, 20 images · non-contrast
Comparison: No comparison studies available.

CLINICAL DATA: Initial encounter for two week history of throbbing
headache with nausea.

EXAM:
CT HEAD WITHOUT CONTRAST
TECHNIQUE: Contiguous axial images were obtained from the base of the skull
through the vertex without intravenous contrast.

[Series 2: head 5.0 h30s · axial · 0.44mm/px · z∈[-124,+21]mm · 16 of 33 slices shown, 20 images]
[im 2/33  brain]
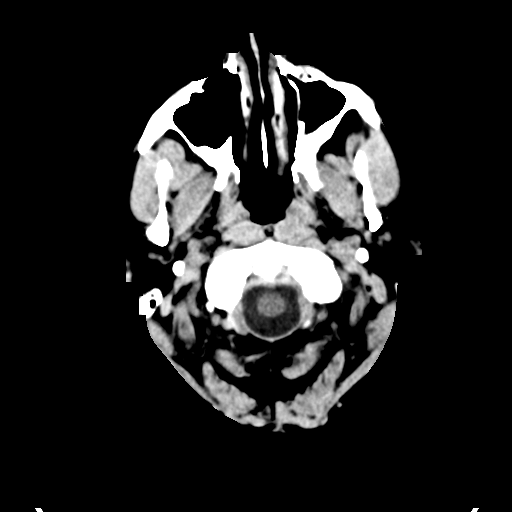
[im 2/33  bone]
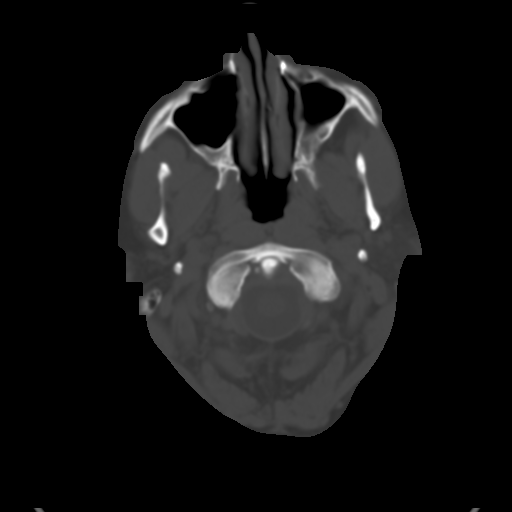
[im 4/33  brain]
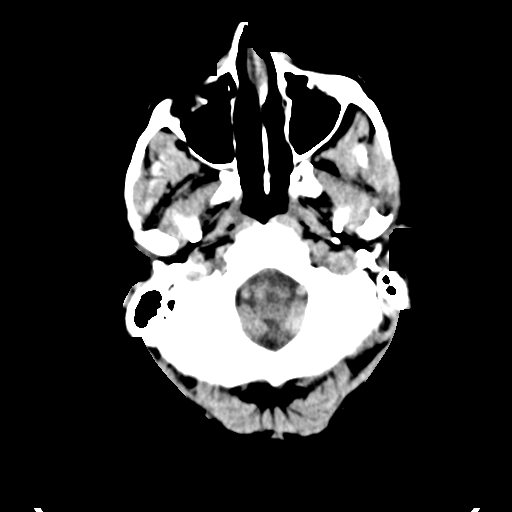
[im 6/33  brain]
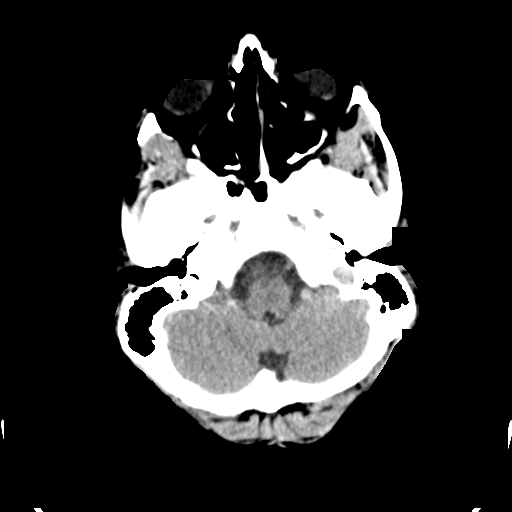
[im 8/33  brain]
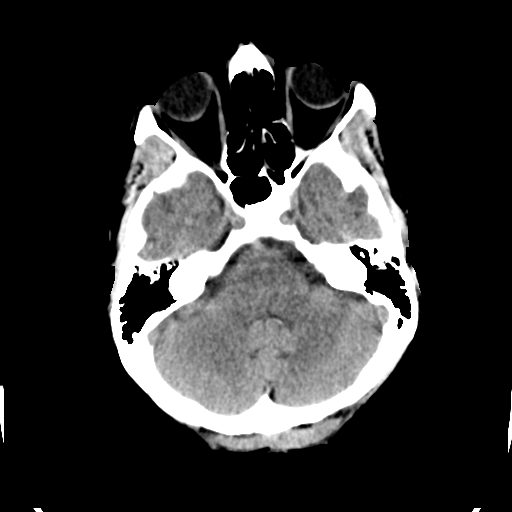
[im 9/33  brain]
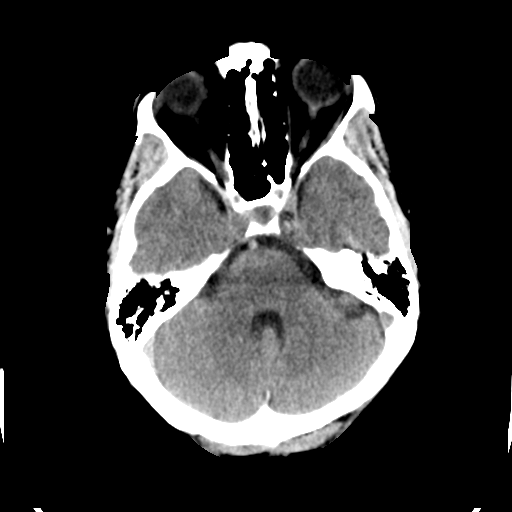
[im 9/33  bone]
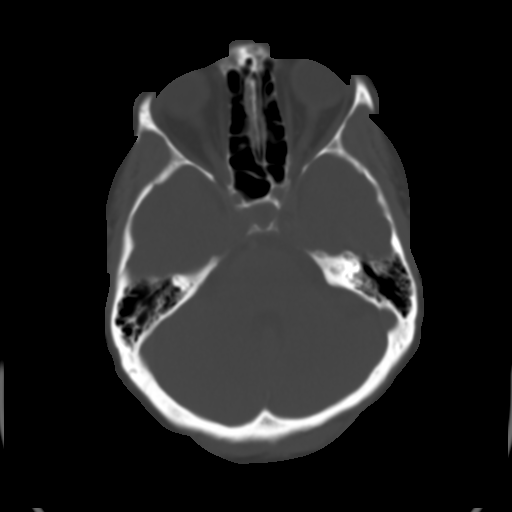
[im 12/33  brain]
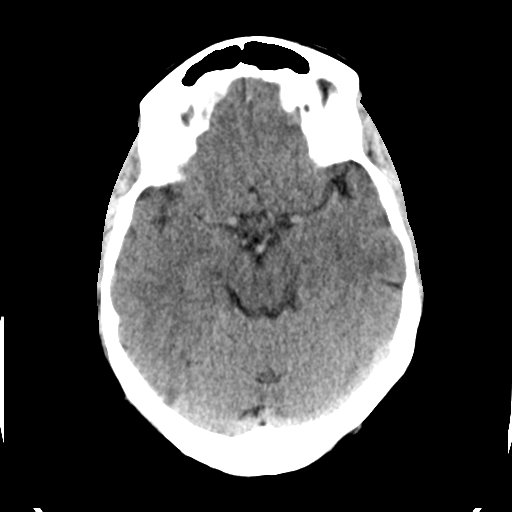
[im 14/33  brain]
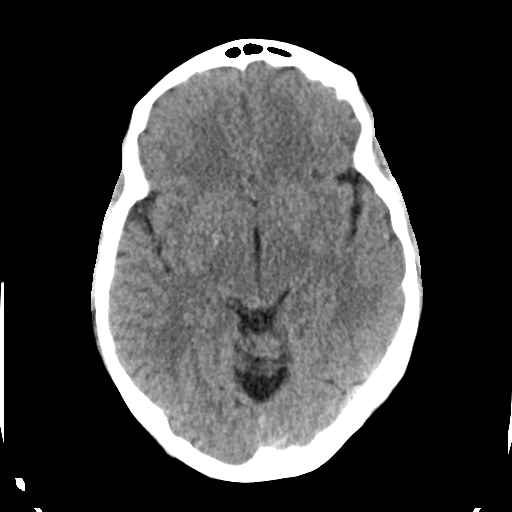
[im 16/33  brain]
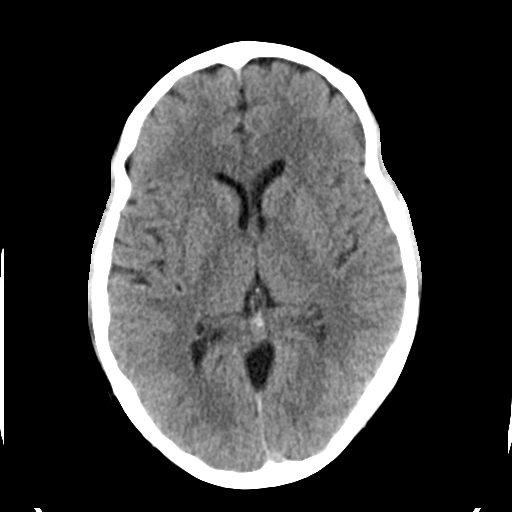
[im 17/33  brain]
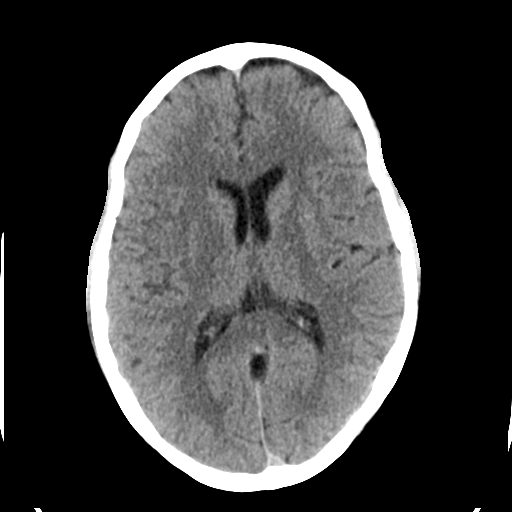
[im 17/33  bone]
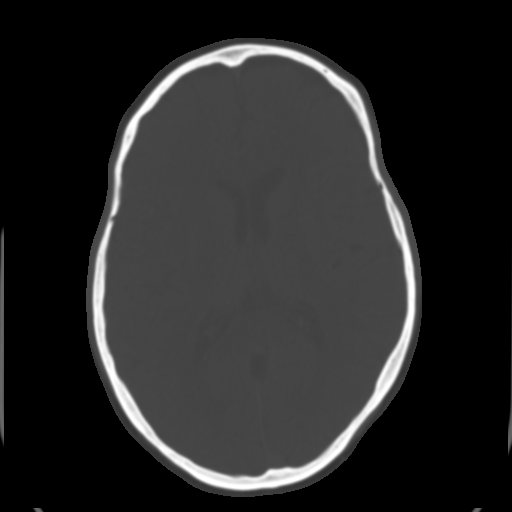
[im 19/33  brain]
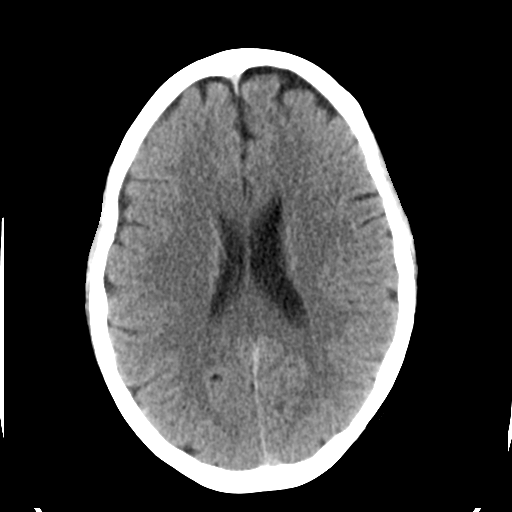
[im 21/33  brain]
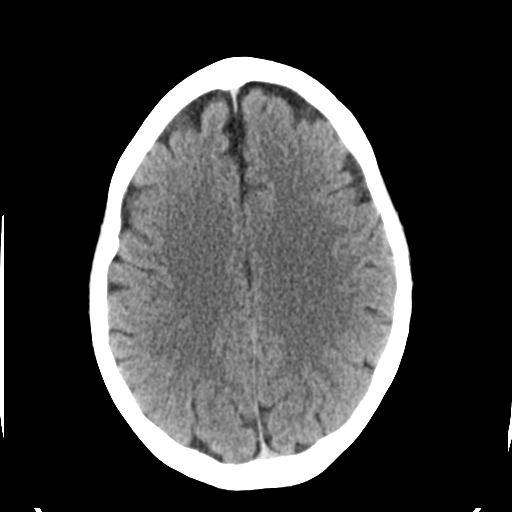
[im 24/33  brain]
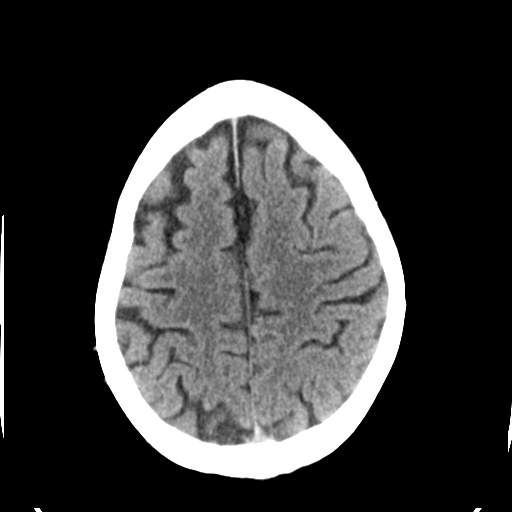
[im 25/33  brain]
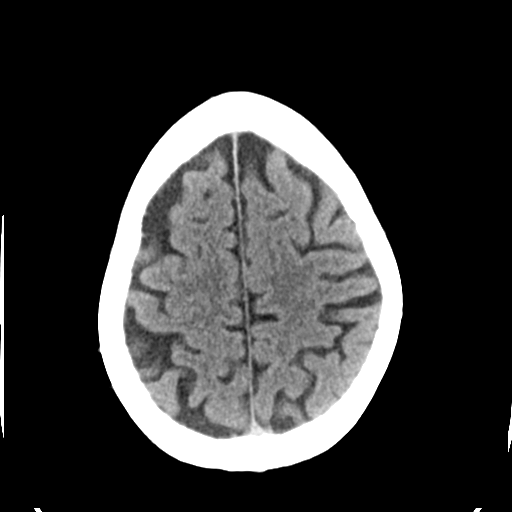
[im 25/33  bone]
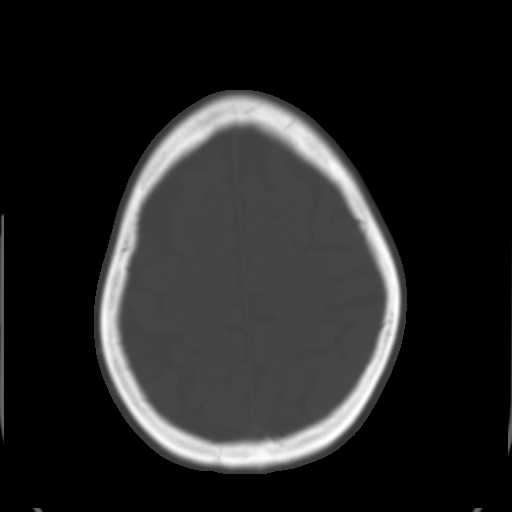
[im 27/33  brain]
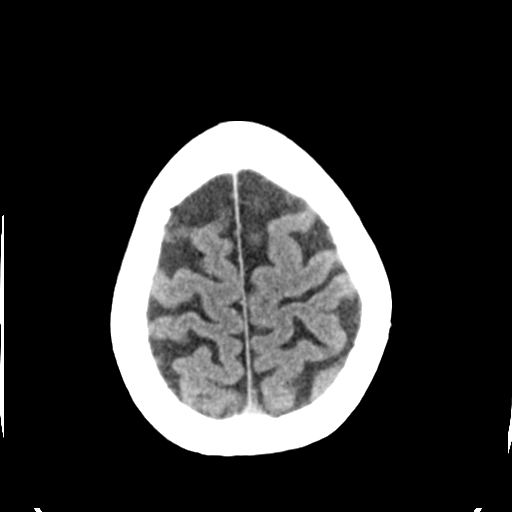
[im 29/33  brain]
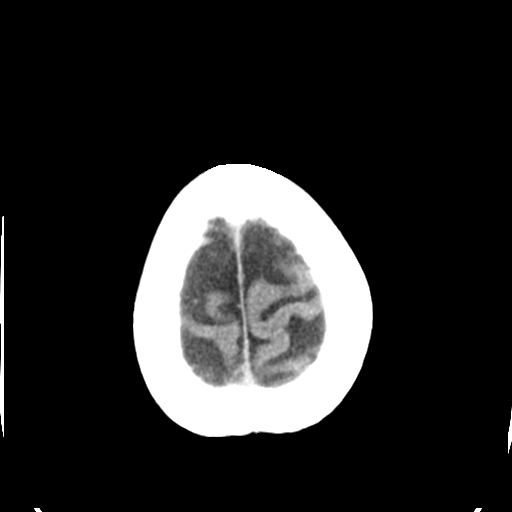
[im 31/33  brain]
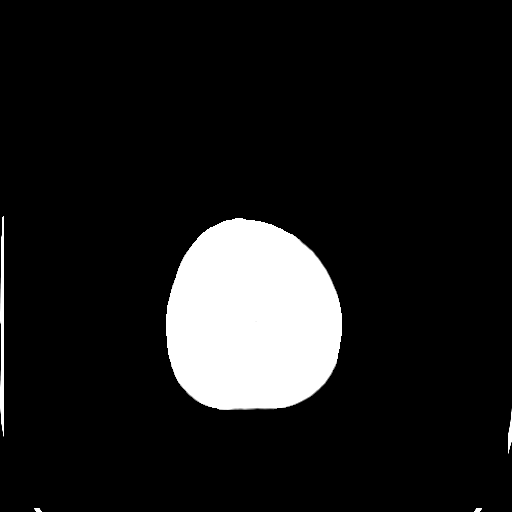

[16 of 30 positions shown; findings below may reference images not displayed]

FINDINGS: There is no evidence for acute hemorrhage, hydrocephalus, mass
lesion, or abnormal extra-axial fluid collection. No definite CT
evidence for acute infarction. Diffuse loss of parenchymal volume is
consistent with atrophy. The visualized paranasal sinuses and
mastoid air cells are clear. Postsurgical changes in the maxillary
sinuses are incompletely visualized.
IMPRESSION: Mild atrophy.  Otherwise unremarkable CT evaluation of the brain.

## 2014-08-08 MED ORDER — LISINOPRIL 10 MG PO TABS: 20.0000 mg | ORAL_TABLET | Freq: Every day | ORAL | Status: DC

## 2014-08-08 MED ORDER — LISINOPRIL 20 MG PO TABS
20.0000 mg | ORAL_TABLET | Freq: Once | ORAL | Status: AC
  Administered 2014-08-08: 20 mg via ORAL
  Filled 2014-08-08: qty 1

## 2014-08-08 MED ORDER — LABETALOL HCL 5 MG/ML IV SOLN
20.0000 mg | Freq: Once | INTRAVENOUS | Status: DC
  Filled 2014-08-08: qty 4

## 2014-08-08 MED ORDER — LABETALOL HCL 5 MG/ML IV SOLN
0.5000 mg/min | INTRAVENOUS | Status: DC
  Administered 2014-08-08: 20 mg via INTRAVENOUS
  Administered 2014-08-08: 0.5 mg/min via INTRAVENOUS
  Filled 2014-08-08: qty 100

## 2014-08-08 MED ORDER — LABETALOL HCL 5 MG/ML IV SOLN
20.0000 mg | Freq: Once | INTRAVENOUS | Status: AC
  Administered 2014-08-08: 20 mg via INTRAVENOUS
  Filled 2014-08-08: qty 4

## 2014-08-08 MED ORDER — CLONIDINE HCL 0.1 MG PO TABS: 0.1000 mg | ORAL_TABLET | Freq: Three times a day (TID) | ORAL | Status: DC | PRN

## 2014-08-08 MED ORDER — KETOROLAC TROMETHAMINE 30 MG/ML IJ SOLN
30.0000 mg | Freq: Once | INTRAMUSCULAR | Status: AC
  Administered 2014-08-08: 30 mg via INTRAVENOUS
  Filled 2014-08-08: qty 1

## 2014-08-08 MED ORDER — DIPHENHYDRAMINE HCL 50 MG/ML IJ SOLN
25.0000 mg | Freq: Once | INTRAMUSCULAR | Status: AC
  Administered 2014-08-08: 25 mg via INTRAVENOUS
  Filled 2014-08-08: qty 1

## 2014-08-08 MED ORDER — METOCLOPRAMIDE HCL 5 MG/ML IJ SOLN
10.0000 mg | Freq: Once | INTRAMUSCULAR | Status: AC
  Administered 2014-08-08: 10 mg via INTRAVENOUS
  Filled 2014-08-08: qty 2

## 2014-08-08 MED ORDER — SODIUM CHLORIDE 0.9 % IV BOLUS (SEPSIS)
1000.0000 mL | Freq: Once | INTRAVENOUS | Status: AC
  Administered 2014-08-08: 1000 mL via INTRAVENOUS

## 2014-08-08 MED ORDER — LABETALOL HCL 5 MG/ML IV SOLN: 10.0000 mg | Freq: Once | INTRAVENOUS | Status: DC

## 2014-08-08 NOTE — Discharge Instructions (Signed)

## 2014-08-08 NOTE — ED Provider Notes (Signed)
  Face-to-face evaluation   History: Intermittent headache for 3 weeks, with last 48 hours.  It is associated with some minor confusion.  No change in ability to ambulate or focal weakness.  Headache is primarily left-sided.  No neck pain.  No thunderclap component.  Physical exam: Alert, calm; he seems somewhat distracted.  No asymmetry of strength or sensation.  Normal finger-to-nose, and heel-to-shin, bilaterally.  Moderate hypertension, initially, spontaneously improved.  Treated with analgesia. Sent for imaging to rule out space-occupying lesion.  Medical screening examination/treatment/procedure(s) were conducted as a shared visit with non-physician practitioner(s) and myself.  I personally evaluated the patient during the encounter  Richarda Blade, MD 08/09/14 [phone]

## 2014-08-08 NOTE — ED Provider Notes (Signed)
CSN: [id number]     Arrival date & time 08/08/14  1415 History   First MD Initiated Contact with Christina [name] 08/08/14 1508     Chief Complaint  Christina [name] presents with  . Headache     (Consider location/radiation/quality/duration/timing/severity/associated sxs/prior Treatment) HPI Comments: Christina [name] with PMH of Hypothyroid presents to the ED with a chief complaint of headache.  Christina [name] states that she has had intermittent headaches for the past 2 weeks.  She is accompanied by her husband, Dr. Sarajane Jews of [facility]. She states that her pain is 8/10. Christina [name] states that she has tried taking ibuprofen with little relief.  She denies any weakness, numbness, or tingling.  She denies any fevers, chills, or neck stiffness.  She has not had headaches this severe in the past.  Headache was not sudden in onset, but instead has progressively worsened.  She also states that her BP is usually normal, but today she is quite hypertensive on arrival.  The history is provided by the Christina [name]. No language interpreter was used.    Past Medical History  Diagnosis Date  . Allergic rhinitis   . Hyperthyroidism     s/p rai 8/06 now hypothyroid Dr. Suzette Battiest  . Grave's disease   . Hypothyroid   . H/O menorrhagia 2012  . Vitamin D deficiency   . Fibroid   . H/O insomnia   . Infertility, female   . H/O Graves' disease   . History of chicken pox   . Menorrhagia     Had Novasure  . Polyp of cervix     Endometriel polyp. JO  . Fibroids   . Vitamin D deficiency    Past Surgical History  Procedure Laterality Date  . Mouth surgery    . Bunionectomy  1986  . Refractive surgery      for vision  . Novasure ablation  09/28/2010  . Hysteroscopy w/d&c  09/28/2010  . Back surgery    . Dilation and curettage of uterus  09/15/2010  . Tonsillectomy  1965   Family History  Problem Relation Age of Onset  . Diabetes Mother   . Hypertension    . Prostate cancer      grandfather  . Hypertension Father    History   Substance Use Topics  . Smoking status: Never Smoker   . Smokeless tobacco: Not on file  . Alcohol Use: Yes     Comment: socially   OB History    No data available     Review of Systems  Constitutional: Negative for fever and chills.  Respiratory: Negative for shortness of breath.   Cardiovascular: Negative for chest pain.  Gastrointestinal: Negative for nausea, vomiting, diarrhea and constipation.  Genitourinary: Negative for dysuria.  All other systems reviewed and are negative.     Allergies  Review of Christina [name]'s allergies indicates no known allergies.  Home Medications   Prior to Admission medications   Medication Sig Start Date End Date Taking? Authorizing Provider  ibuprofen (ADVIL,MOTRIN) 200 MG tablet Take 200 mg by mouth every 6 (six) hours as needed.   Yes Historical Provider, MD  SYNTHROID 150 MCG tablet Take 1 tablet (150 mcg total) by mouth daily before breakfast. 07/01/14  Yes Burnis Medin, MD  zolpidem (AMBIEN) 10 MG tablet Take 1/2 to 1 po qd at night as needed. Christina [name] taking differently: Take 10 mg by mouth at bedtime as needed.  05/13/14  Yes Burnis Medin, MD   BP 213/110 mmHg  Pulse 92  Temp(Src)  97.4 F (36.3 C) (Oral)  Resp 18  Ht 5\' 7"  (1.702 m)  Wt 160 lb (72.576 kg)  BMI 25.05 kg/m2  SpO2 99% Physical Exam  Constitutional: She is oriented to person, place, and time. She appears well-developed and well-nourished.  HENT:  Head: Normocephalic and atraumatic.  Right Ear: External ear normal.  Left Ear: External ear normal.  Eyes: Conjunctivae and EOM are normal. Pupils are equal, round, and reactive to light.  Neck: Normal range of motion. Neck supple.  No pain with neck flexion, no meningismus  Cardiovascular: Normal rate, regular rhythm and normal heart sounds.  Exam reveals no gallop and no friction rub.   No murmur heard. Pulmonary/Chest: Effort normal and breath sounds normal. No respiratory distress. She has no wheezes. She has no  rales. She exhibits no tenderness.  Abdominal: Soft. She exhibits no distension and no mass. There is no tenderness. There is no rebound and no guarding.  Musculoskeletal: Normal range of motion. She exhibits no edema or tenderness.  Normal gait.  Neurological: She is alert and oriented to person, place, and time. She has normal reflexes.  CN 3-12 intact, normal finger to nose, no pronator drift, sensation and strength intact bilaterally.  Skin: Skin is warm and dry.  Psychiatric: She has a normal mood and affect. Her behavior is normal. Judgment and thought content normal.  Nursing note and vitals reviewed.   ED Course  Procedures (including critical care time) Results for orders placed or performed during the hospital encounter of 08/08/14  CBC with Differential  Result Value Ref Range   WBC 12.6 (H) 4.0 - 10.5 K/uL   RBC 4.75 3.87 - 5.11 MIL/uL   Hemoglobin 14.5 12.0 - 15.0 g/dL   HCT 43.0 36.0 - 46.0 %   MCV 90.5 78.0 - 100.0 fL   MCH 30.5 26.0 - 34.0 pg   MCHC 33.7 30.0 - 36.0 g/dL   RDW 13.6 11.5 - 15.5 %   Platelets 365 150 - 400 K/uL   Neutrophils Relative % 65 43 - 77 %   Lymphocytes Relative 21 12 - 46 %   Monocytes Relative 4 3 - 12 %   Eosinophils Relative 4 0 - 5 %   Basophils Relative 6 (H) 0 - 1 %   Neutro Abs 8.2 (H) 1.7 - 7.7 K/uL   Lymphs Abs 2.6 0.7 - 4.0 K/uL   Monocytes Absolute 0.5 0.1 - 1.0 K/uL   Eosinophils Absolute 0.5 0.0 - 0.7 K/uL   Basophils Absolute 0.8 (H) 0.0 - 0.1 K/uL   RBC Morphology POLYCHROMASIA PRESENT    WBC Morphology TOXIC GRANULATION    Smear Review LARGE PLATELETS PRESENT   Basic metabolic panel  Result Value Ref Range   Sodium 137 135 - 145 mmol/L   Potassium 3.4 (L) 3.5 - 5.1 mmol/L   Chloride 103 96 - 112 mEq/L   CO2 23 19 - 32 mmol/L   Glucose, Bld 97 70 - 99 mg/dL   BUN 11 6 - 23 mg/dL   Creatinine, Ser 0.73 0.50 - 1.10 mg/dL   Calcium 9.0 8.4 - 10.5 mg/dL   GFR calc non Af Amer >90 >90 mL/min   GFR calc Af Amer >90 >90  mL/min   Anion gap 11 5 - 15  Urinalysis, Routine w reflex microscopic  Result Value Ref Range   Color, Urine YELLOW YELLOW   APPearance CLEAR CLEAR   Specific Gravity, Urine 1.015 1.005 - 1.030   pH 6.5 5.0 -  8.0   Glucose, UA NEGATIVE NEGATIVE mg/dL   Hgb urine dipstick SMALL (A) NEGATIVE   Bilirubin Urine NEGATIVE NEGATIVE   Ketones, ur 15 (A) NEGATIVE mg/dL   Protein, ur NEGATIVE NEGATIVE mg/dL   Urobilinogen, UA 0.2 0.0 - 1.0 mg/dL   Nitrite NEGATIVE NEGATIVE   Leukocytes, UA NEGATIVE NEGATIVE  Urine microscopic-add on  Result Value Ref Range   Squamous Epithelial / LPF RARE RARE   WBC, UA 0-2 <3 WBC/hpf   RBC / HPF 0-2 <3 RBC/hpf   Bacteria, UA RARE RARE  TSH  Result Value Ref Range   TSH 0.614 0.350 - 4.500 uIU/mL   Ct Head Wo Contrast  08/08/2014   CLINICAL DATA:  Initial encounter for two week history of throbbing headache with nausea.  EXAM: CT HEAD WITHOUT CONTRAST  TECHNIQUE: Contiguous axial images were obtained from the base of the skull through the vertex without intravenous contrast.  COMPARISON:  No comparison studies available.  FINDINGS: There is no evidence for acute hemorrhage, hydrocephalus, mass lesion, or abnormal extra-axial fluid collection. No definite CT evidence for acute infarction. Diffuse loss of parenchymal volume is consistent with atrophy. The visualized paranasal sinuses and mastoid air cells are clear. Postsurgical changes in the maxillary sinuses are incompletely visualized.  IMPRESSION: Mild atrophy.  Otherwise unremarkable CT evaluation of the brain.   Electronically Signed   By: Misty Stanley M.D.   On: 08/08/2014 16:03     Imaging Review No results found.   EKG Interpretation   Date/Time:  Friday August 08 2014 14:51:36 EST Ventricular Rate:  78 PR Interval:  159 QRS Duration: 101 QT Interval:  434 QTC Calculation: 494 R Axis:   47 Text Interpretation:  Sinus rhythm Borderline T abnormalities, anterior  leads Borderline prolonged  QT interval No old tracing to compare Confirmed  by [facility]  MD, ELLIOTT [phone]) on 08/08/2014 3:15:04 PM      MDM   Final diagnoses:  Headache  Hypertensive urgency    Christina [name] with new, intermittent headaches x 2 weeks.  Will check head CT to rule out mass/bleed, labs, and treat pain.  Christina [name] immediately seen by and discussed with Dr. Eulis Foster.  4:05 PM Christina [name] back from CT.  States that her headache is now a 2/10.  She states that she is feeling much better.  Will continue to monitor blood pressure and check labs.   4:17 PM CT negative.  BP still high.  Will treat for hypertensive urgency with labetalol infusion.    5:35 PM MAP is 103, would like to see this below 100 to achieve goal of 25% reduction.  Will stop infusion and bolus with 20mg  per Dr. Eulis Foster.  Christina [name] would like to go home.  Therefore, if adequate reduction can be achieved through bolus and stopping drip, that would be ideal for discharge.  Filed Vitals:   08/08/14 1830  BP: 149/84  Pulse: 80  Temp:   Resp: 21   BP controlled and stable.  Discussed with Dr. Eulis Foster, will discharge to home with lisinopril and clonidine.  Close follow-up urged.  Christina [name] understands and agrees with the plan.  She is stable and ready for discharge.   CRITICAL CARE Performed by: Montine Circle   Total critical care time: 40  Critical care time was exclusive of separately billable procedures and treating other patients.  Critical care was necessary to treat or prevent imminent or life-threatening deterioration.  Critical care was time spent personally by me on the following activities: development  of treatment plan with Christina [name] and/or surrogate as well as nursing, discussions with consultants, evaluation of Christina [name]'s response to treatment, examination of Christina [name], obtaining history from Christina [name] or surrogate, ordering and performing treatments and interventions, ordering and review of laboratory studies, ordering and review of  radiographic studies, pulse oximetry and re-evaluation of Christina [name]'s condition.  Medications  ketorolac (TORADOL) 30 MG/ML injection 30 mg (30 mg Intravenous Given 08/08/14 1525)  metoCLOPramide (REGLAN) injection 10 mg (10 mg Intravenous Given 08/08/14 1529)  diphenhydrAMINE (BENADRYL) injection 25 mg (25 mg Intravenous Given 08/08/14 1523)  sodium chloride 0.9 % bolus 1,000 mL (0 mLs Intravenous Stopped 08/08/14 1715)  labetalol (NORMODYNE,TRANDATE) injection 20 mg (20 mg Intravenous Given 08/08/14 1810)  lisinopril (PRINIVIL,ZESTRIL) tablet 20 mg (20 mg Oral Given 08/08/14 1846)     Montine Circle, PA-C 08/08/14 2324  Montine Circle, PA-C 08/09/14 0488  Richarda Blade, MD 08/09/14 [phone]

## 2014-08-08 NOTE — ED Notes (Signed)
Pt presents to department for evaluation of headache x2 weeks. Also reports nausea. 8/10 "throbbing" pain upon arrival to ED. Pt is alert and oriented x4. No neurological deficits noted.

## 2014-08-08 NOTE — ED Notes (Signed)
To ct via stretcher

## 2014-08-13 ENCOUNTER — Encounter: Payer: Self-pay | Admitting: Internal Medicine

## 2014-08-13 ENCOUNTER — Ambulatory Visit (INDEPENDENT_AMBULATORY_CARE_PROVIDER_SITE_OTHER): Payer: Managed Care, Other (non HMO) | Admitting: Internal Medicine

## 2014-08-13 VITALS — BP 116/76 | Temp 97.7°F | Ht 67.75 in | Wt 171.1 lb

## 2014-08-13 DIAGNOSIS — R519 Headache, unspecified: Secondary | ICD-10-CM

## 2014-08-13 DIAGNOSIS — R51 Headache: Secondary | ICD-10-CM

## 2014-08-13 DIAGNOSIS — I10 Essential (primary) hypertension: Secondary | ICD-10-CM

## 2014-08-13 LAB — CBC WITH DIFFERENTIAL/PLATELET
BASOS ABS: 0.7 10*3/uL — AB (ref 0.0–0.1)
Basophils Relative: 4.7 % — ABNORMAL HIGH (ref 0.0–3.0)
EOS PCT: 3.4 % (ref 0.0–5.0)
Eosinophils Absolute: 0.5 10*3/uL (ref 0.0–0.7)
HCT: 41.7 % (ref 36.0–46.0)
Hemoglobin: 13.8 g/dL (ref 12.0–15.0)
LYMPHS PCT: 15.6 % (ref 12.0–46.0)
Lymphs Abs: 2.2 10*3/uL (ref 0.7–4.0)
MCHC: 32.9 g/dL (ref 30.0–36.0)
MCV: 91.7 fl (ref 78.0–100.0)
MONO ABS: 0.5 10*3/uL (ref 0.1–1.0)
Monocytes Relative: 3.3 % (ref 3.0–12.0)
NEUTROS ABS: 10.1 10*3/uL — AB (ref 1.4–7.7)
Neutrophils Relative %: 73 % (ref 43.0–77.0)
Platelets: 419 10*3/uL — ABNORMAL HIGH (ref 150.0–400.0)
RBC: 4.55 Mil/uL (ref 3.87–5.11)
RDW: 14.2 % (ref 11.5–15.5)
WBC: 13.9 10*3/uL — ABNORMAL HIGH (ref 4.0–10.5)

## 2014-08-13 LAB — BASIC METABOLIC PANEL
BUN: 19 mg/dL (ref 6–23)
CO2: 27 mEq/L (ref 19–32)
Calcium: 9.3 mg/dL (ref 8.4–10.5)
Chloride: 103 mEq/L (ref 96–112)
Creatinine, Ser: 0.7 mg/dL (ref 0.4–1.2)
GFR: 86.89 mL/min (ref >60.00)
Glucose, Bld: 71 mg/dL (ref 70–99)
POTASSIUM: 4.1 meq/L (ref 3.5–5.1)
Sodium: 135 mEq/L (ref 135–145)

## 2014-08-13 LAB — SEDIMENTATION RATE: SED RATE: 8 mm/h (ref 0–22)

## 2014-08-13 LAB — C-REACTIVE PROTEIN: CRP: 0.5 mg/dL (ref 0.5–20.0)

## 2014-08-13 MED ORDER — VERAPAMIL HCL ER 240 MG PO TBCR: 240.0000 mg | EXTENDED_RELEASE_TABLET | Freq: Every day | ORAL | Status: DC

## 2014-08-13 NOTE — Progress Notes (Signed)
Pre visit review using our clinic review tool, if applicable. No additional management support is needed unless otherwise documented below in the visit note.  Chief Complaint  Patient presents with  . Hospitalization Follow-up    ed fu sever ht and left HA    HPI: Patient  Christina [name]  54 y.o. comes in today for  Fu ed for severe ht and left sided HA on dec awoke with crescendo ha sever left  And assoc with elevated bp  bp 213/110 and came down fiarly quickley  Husband MD felt she was a in ditress  No vision change  Not making sense but no vision change in hearing .  Given toradol reglan benadryul and labetolol and lisinopril  And as needed clonidine  Head ct neg  Had been getting am has off and on for about 3 weeks or so  Left sided   News   am .  ocass ha in past   w up 5 am with HA    Took advil   Stopped after ed .    advil helps .  No etoh .   Throbbing quality and early some tearing  and sometimes like an earache left .  No vision changes  Nausea the first day no vomiting.  20 mg  Lsiinopril.   Clonidine  About 2 x per day .since then   HA about 3-4 now Brother with migraines.  She had ha when younger but not any time recently Graves sp rx on replacement  Stable  Uses ambien chronically for sleep  Some stressful event in family day before. No injury  No etoh but for ha not excessive.   Health Maintenance  Topic Date Due  . TETANUS/TDAP  07/06/1979  . COLONOSCOPY  07/05/2010  . MAMMOGRAM  05/24/2013  . INFLUENZA VACCINE  03/09/2015  . PAP SMEAR  09/11/2016    ROS:  GEN/ HEENT: No fever, significant weight changes sweatsvision problems hearing changes, CV/ PULM; No chest pain shortness of breath cough, syncope,edema  change in exercise tolerance. GI /GU: No adominal pain, vomiting, change in bowel habits. No blood in the stool. No significant GU symptoms. SKIN/HEME: ,no acute skin rashes suspicious lesions or bleeding. No lymphadenopathy, nodules, masses.  NEURO/ PSYCH:  No  neurologic signs such as weakness numbness. No depression anxiety. IMM/ Allergy: No unusual infections.  Allergy .   REST of 12 system review negative except as per HPI   Past Medical History  Diagnosis Date  . Allergic rhinitis   . Hyperthyroidism     s/p rai 8/06 now hypothyroid Dr. Suzette Battiest  . Grave's disease   . Hypothyroid   . H/O menorrhagia 2012  . Vitamin D deficiency   . Fibroid   . H/O insomnia   . Infertility, female   . H/O Graves' disease   . History of chicken pox   . Menorrhagia     Had Novasure  . Polyp of cervix     Endometriel polyp. JO  . Fibroids   . Vitamin D deficiency     Past Surgical History  Procedure Laterality Date  . Mouth surgery    . Bunionectomy  1986  . Refractive surgery      for vision  . Novasure ablation  09/28/2010  . Hysteroscopy w/d&c  09/28/2010  . Back surgery    . Dilation and curettage of uterus  09/15/2010  . Tonsillectomy  1965    Family History  Problem Relation Age of  Onset  . Diabetes Mother   . Hypertension    . Prostate cancer      grandfather  . Hypertension Father     History   Social History  . Marital Status: Married    Spouse Name: N/A    Number of Children: N/A  . Years of Education: N/A   Social History Main Topics  . Smoking status: Never Smoker   . Smokeless tobacco: None  . Alcohol Use: Yes     Comment: socially  . Drug Use: None  . Sexual Activity: Yes   Other Topics Concern  . 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 cloNIDine (CATAPRES) 0.1 MG tablet Take 1 tablet (0.1 mg total) by mouth every 8 (eight) hours as needed.  Marland Kitchen ibuprofen (ADVIL,MOTRIN) 200 MG tablet Take 200 mg by mouth every 6 (six) hours as needed.  Marland Kitchen lisinopril (PRINIVIL,ZESTRIL) 10 MG tablet Take 2 tablets (20 mg total) by mouth daily.  Marland Kitchen SYNTHROID 150 MCG tablet Take 1 tablet (150 mcg  total) by mouth daily before breakfast.  . zolpidem (AMBIEN) 10 MG tablet Take 1/2 to 1 po qd at night as needed. (Patient taking differently: Take 10 mg by mouth at bedtime as needed. )  . verapamil (CALAN-SR) 240 MG CR tablet Take 1 tablet (240 mg total) by mouth at bedtime.    EXAM:  BP 116/76 mmHg  Temp(Src) 97.7 F (36.5 C) (Oral)  Ht 5' 7.75" (1.721 m)  Wt 171 lb 1.6 oz (77.61 kg)  BMI 26.20 kg/m2  Body mass index is 26.2 kg/(m^2). Repeat bp 126/80 =extremities upper Physical Exam: Vital signs reviewed NAT:FTDD is a well-developed well-nourished alert cooperative    who appearsr stated age in no acute distress.  Mild discomfort  HEENT: normocephalic atraumatic , Eyes: PERRL EOM's full, conjunctiva clear, Nares: paten,t no deformity discharge or tenderness., Ears: no deformity EAC's clear TMs with normal landmarks hearing aid . Mouth: clear OP, no lesions, edema.  Moist mucous membranes. Dentition in adequate repair. NECK: supple without masses, thyromegaly or bruits. CHEST/PULM:  Clear to auscultation and percussion breath sounds equal no wheeze , rales or rhonchi.  CV: PMI is nondisplaced, S1 S2 no gallops, murmurs, rubs. Peripheral pulses are full without delay.No JVD .  ABDOMEN: Bowel sounds normal nontender  No guard or rebound, no hepato splenomegal no CVA tenderness. . Extremtities:  No clubbing cyanosis or edema, no acute joint swelling or redness no focal atrophy NEURO:  Oriented x3, cranial nerves 3-12 x hearingappear to be intact, no obvious focal weakness,gait within normal limits no abnormal reflexes or asymmetrical SKIN: No acute rashes normal turgor, color, no bruising or petechiae. PSYCH: Oriented, good eye contact, no obvious depression anxiety, cognition and judgment appear normal. LN: no cervical  adenopathy reveiwed ed   ASSESSMENT AND PLAN:  Discussed the following assessment and plan:  Hypertension, uncontrolled - Plan: Basic metabolic panel, CBC with  Differential, C-reactive protein, Sedimentation rate  New onset headache - left sided  better since ed visit neg ct scan - Plan: Basic metabolic panel, CBC with Differential, C-reactive protein, Sedimentation rate Recheck bmp consider eva,l for seondary causes  Renal doppler  Neuro check if no imporving   Ha acts like migraine and or cluster  But severe  Plan rx BP  Change clonidine to  verapamil and follow  Avoid low bp  Consider further eval ... Prefer sto wait as had lits of stress before onset Had no sweats diarrhea with ha   Other sx   Patient Care Team: Burnis Medin, MD as PCP - General [name], MD (Endocrinology) Thyra Breed, MD (Internal Medicine) Delice Lesch, MD as Attending Physician (Obstetrics and Gynecology) Patient Instructions  Add medication for BP . Stay on the the lisinopril as now.   verapamil   Instead of clonidine .Marland Kitchen Can also helps helps ha. Supression. Labs today   .Marland Kitchen Consideration of evaluating for secondary causes of hypertension.     ROV in 2-4 weeks or so       Abbigael Detlefsen K. Breeann Reposa M.D.

## 2014-08-13 NOTE — Patient Instructions (Addendum)
Add medication for BP . Stay on the the lisinopril as now.   verapamil   Instead of clonidine .Marland Kitchen Can also helps helps ha. Supression. Labs today   .Marland Kitchen Consideration of evaluating for secondary causes of hypertension.     ROV in 2-4 weeks or so

## 2014-08-20 ENCOUNTER — Telehealth: Payer: Self-pay | Admitting: Internal Medicine

## 2014-08-20 NOTE — Telephone Encounter (Signed)
emmi mailed  °

## 2014-08-28 ENCOUNTER — Ambulatory Visit (INDEPENDENT_AMBULATORY_CARE_PROVIDER_SITE_OTHER): Payer: Managed Care, Other (non HMO) | Admitting: Internal Medicine

## 2014-08-28 ENCOUNTER — Encounter: Payer: Self-pay | Admitting: Internal Medicine

## 2014-08-28 VITALS — BP 112/66 | Temp 97.2°F | Ht 67.75 in | Wt 171.3 lb

## 2014-08-28 DIAGNOSIS — R519 Headache, unspecified: Secondary | ICD-10-CM

## 2014-08-28 DIAGNOSIS — Z79899 Other long term (current) drug therapy: Secondary | ICD-10-CM

## 2014-08-28 DIAGNOSIS — R51 Headache: Secondary | ICD-10-CM

## 2014-08-28 DIAGNOSIS — I10 Essential (primary) hypertension: Secondary | ICD-10-CM

## 2014-08-28 DIAGNOSIS — G4700 Insomnia, unspecified: Secondary | ICD-10-CM

## 2014-08-28 LAB — BASIC METABOLIC PANEL
BUN: 22 mg/dL (ref 6–23)
CO2: 21 mEq/L (ref 19–32)
Calcium: 9.3 mg/dL (ref 8.4–10.5)
Chloride: 105 mEq/L (ref 96–112)
Creatinine, Ser: 0.74 mg/dL (ref 0.40–1.20)
GFR: 86.88 mL/min (ref >60.00)
Glucose, Bld: 84 mg/dL (ref 70–99)
POTASSIUM: 4 meq/L (ref 3.5–5.1)
Sodium: 136 mEq/L (ref 135–145)

## 2014-08-28 MED ORDER — ZOLPIDEM TARTRATE 10 MG PO TABS: ORAL_TABLET | ORAL | Status: DC

## 2014-08-28 MED ORDER — LISINOPRIL 20 MG PO TABS
20.0000 mg | ORAL_TABLET | Freq: Every day | ORAL | Status: DC
Start: 2014-08-28 — End: 2015-09-16

## 2014-08-28 NOTE — Progress Notes (Signed)
Pre visit review using our clinic review tool, if applicable. No additional management support is needed unless otherwise documented below in the visit note.  Chief Complaint  Patient presents with  . Follow-up    BP and headaches     HPI: Christina [name] 54 y.o. fu  Of very high bp and headaches   Placed on verapamil and  Weaned clonidine   On 20 lisinoprill   bp has been in the teens  For at least a week or more and no more has  Sleep no change taking 5 mg most nights of ambien trying to do 5 instead of 10 mg .  No new sx  Feels fine thinks stress may have been an issue with what happened.  Continues to work  No cv resp sx at this time  ROS: See pertinent positives and negatives per HPI.  Past Medical History  Diagnosis Date  . Allergic rhinitis   . Hyperthyroidism     s/p rai 8/06 now hypothyroid Dr. Suzette Battiest  . Grave's disease   . Hypothyroid   . H/O menorrhagia 2012  . Vitamin D deficiency   . Fibroid   . H/O insomnia   . Infertility, female   . H/O Graves' disease   . History of chicken pox   . Menorrhagia     Had Novasure  . Polyp of cervix     Endometriel polyp. JO  . Fibroids   . Vitamin D deficiency     Family History  Problem Relation Age of Onset  . Diabetes Mother   . Hypertension    . Prostate cancer      grandfather  . Hypertension Father     History   Social History  . Marital Status: Married    Spouse Name: N/A    Number of Children: N/A  . Years of Education: N/A   Social History Main Topics  . Smoking status: Never Smoker   . Smokeless tobacco: None  . Alcohol Use: Yes     Comment: socially  . Drug Use: None  . Sexual Activity: Yes   Other Topics Concern  . 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 ibuprofen (ADVIL,MOTRIN) 200 MG tablet Take 200 mg by mouth every 6 (six) hours as needed.  Marland Kitchen SYNTHROID 150  MCG tablet Take 1 tablet (150 mcg total) by mouth daily before breakfast.  . verapamil (CALAN-SR) 240 MG CR tablet Take 1 tablet (240 mg total) by mouth at bedtime.  Marland Kitchen zolpidem (AMBIEN) 10 MG tablet Take 1/2 to 1 po qd at night as needed.  . [DISCONTINUED] cloNIDine (CATAPRES) 0.1 MG tablet Take 1 tablet (0.1 mg total) by mouth every 8 (eight) hours as needed.  . [DISCONTINUED] lisinopril (PRINIVIL,ZESTRIL) 10 MG tablet Take 2 tablets (20 mg total) by mouth daily.  . [DISCONTINUED] zolpidem (AMBIEN) 10 MG tablet Take 1/2 to 1 po qd at night as needed. (Patient taking differently: Take 10 mg by mouth at bedtime as needed. )  . lisinopril (PRINIVIL,ZESTRIL) 20 MG tablet Take 1 tablet (20 mg total) by mouth daily.    EXAM:  BP 112/66 mmHg  Temp(Src) 97.2 F (36.2 C) (Oral)  Ht 5' 7.75" (1.721 m)  Wt 171 lb 4.8 oz (77.701 kg)  BMI 26.23 kg/m2  Body mass index is  26.23 kg/(m^2).  GENERAL: vitals reviewed and listed above, alert, oriented, appears well hydrated and in no acute distress HEENT: atraumatic, conjunctiva  clear, no obvious abnormalities on inspection of external nose and ears NECK: no obvious masses on inspection palpation  LUNGS: clear to auscultation bilaterally, no wheezes, rales or rhonchi, good air movement Abdomen:  Sof,t normal bowel sounds without hepatosplenomegaly, no guarding rebound or masses no CVA tenderness No bruits  CV: HRRR, no clubbing cyanosis or  peripheral edema nl cap refill  MS: moves all extremities without noticeable focal  abnormality PSYCH: pleasant and cooperative, no obvious depression or anxiety BP Readings from Last 3 Encounters:  08/28/14 112/66  08/13/14 116/76  08/08/14 149/84   Lab Results  Component Value Date   WBC 13.9* 08/13/2014   HGB 13.8 08/13/2014   HCT 41.7 08/13/2014   PLT 419.0* 08/13/2014   GLUCOSE 84 08/28/2014   CHOL 195 05/17/2010   TRIG 96.0 05/17/2010   HDL 53.30 05/17/2010   LDLCALC 123* 05/17/2010   ALT 15  04/25/2012   AST 15 04/25/2012   NA 136 08/28/2014   K 4.0 08/28/2014   CL 105 08/28/2014   CREATININE 0.74 08/28/2014   BUN 22 08/28/2014   CO2 21 08/28/2014   TSH 0.614 08/08/2014    ASSESSMENT AND PLAN:  Discussed the following assessment and plan:  Essential hypertension - excellent control ; could have been increasing over time sleep about the same taking 5 zolp most times noclear cut osa sx consider sleep eval  - Plan: Basic metabolic panel  Insomnia - refill  risk benefit  - Plan: zolpidem (AMBIEN) 10 MG tablet  New onset headache - better    controlled  on verapamil and acei ? if bp related at this time no new sx  observe closely refer if recurrent alarm sx   Medication management - Plan: Basic metabolic panel Doing so well will not do further work up at this time  Close follow up indicated .  ROV 3 months -Patient advised to return or notify health care team  if symptoms worsen ,persist or new concerns arise.  Patient Instructions  Continue BP  medicine . As you readings are good and headaches are better .  Chemistry  Today to check renal function.    Standley Brooking. Braylee Lal M.D.

## 2014-08-28 NOTE — Patient Instructions (Signed)
Continue BP  medicine . As you readings are good and headaches are better .  Chemistry  Today to check renal function.

## 2014-11-10 ENCOUNTER — Telehealth: Payer: Self-pay | Admitting: Family Medicine

## 2014-11-10 MED ORDER — VERAPAMIL HCL ER 240 MG PO TBCR: 240.0000 mg | EXTENDED_RELEASE_TABLET | Freq: Every day | ORAL | Status: DC

## 2014-11-10 NOTE — Telephone Encounter (Signed)
Sent to the pharmacy by e-scribe. 

## 2014-11-10 NOTE — Telephone Encounter (Signed)
Please refill Verapamil for #90 with refills to [facility] today

## 2014-11-27 ENCOUNTER — Encounter: Payer: Self-pay | Admitting: Internal Medicine

## 2014-11-27 ENCOUNTER — Ambulatory Visit (INDEPENDENT_AMBULATORY_CARE_PROVIDER_SITE_OTHER): Payer: Managed Care, Other (non HMO) | Admitting: Internal Medicine

## 2014-11-27 VITALS — BP 124/66 | HR 81 | Temp 98.0°F | Wt 171.0 lb

## 2014-11-27 DIAGNOSIS — Z87898 Personal history of other specified conditions: Secondary | ICD-10-CM | POA: Diagnosis not present

## 2014-11-27 DIAGNOSIS — I10 Essential (primary) hypertension: Secondary | ICD-10-CM

## 2014-11-27 DIAGNOSIS — G4700 Insomnia, unspecified: Secondary | ICD-10-CM

## 2014-11-27 DIAGNOSIS — Z79899 Other long term (current) drug therapy: Secondary | ICD-10-CM

## 2014-11-27 MED ORDER — ZOLPIDEM TARTRATE 10 MG PO TABS: ORAL_TABLET | ORAL | Status: DC

## 2014-11-27 NOTE — Progress Notes (Signed)
Chief Complaint  Patient presents with  . OTHER    check up on BP    HPI: Christina [name] 54 y.o. nhhere for fu of  Med for ht and minitoring of HAs after severe episode see Ed eval . HAs: no more    Now  BP  Every month taking med  Has been  On range  SE OF ME  No side effect  Sleep still taking ambien  Hard to sleep without snores only if sitting up   ROS: See pertinent positives and negatives per HPI. Due for gyne check   Past Medical History  Diagnosis Date  . Allergic rhinitis   . Hyperthyroidism     s/p rai 8/06 now hypothyroid Dr. Suzette Battiest  . Grave's disease   . Hypothyroid   . H/O menorrhagia 2012  . Vitamin D deficiency   . Fibroid   . H/O insomnia   . Infertility, female   . H/O Graves' disease   . History of chicken pox   . Menorrhagia     Had Novasure  . Polyp of cervix     Endometriel polyp. JO  . Fibroids   . Vitamin D deficiency     Family History  Problem Relation Age of Onset  . Diabetes Mother   . Hypertension    . Prostate cancer      grandfather  . Hypertension Father     History   Social History  . Marital Status: Married    Spouse Name: N/A  . Number of Children: N/A  . Years of Education: N/A   Social History Main Topics  . Smoking status: Never Smoker   . Smokeless tobacco: Not on file  . Alcohol Use: Yes     Comment: socially  . Drug Use: Not on file  . Sexual Activity: Yes   Other Topics Concern  . 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 ibuprofen (ADVIL,MOTRIN) 200 MG tablet Take 200 mg by mouth every 6 (six) hours as needed.  Marland Kitchen lisinopril (PRINIVIL,ZESTRIL) 20 MG tablet Take 1 tablet (20 mg total) by mouth daily.  Marland Kitchen SYNTHROID 150 MCG tablet Take 1 tablet (150 mcg total) by mouth daily before breakfast.  . verapamil (CALAN-SR) 240 MG CR tablet Take 1 tablet (240 mg total) by mouth at  bedtime.  Marland Kitchen zolpidem (AMBIEN) 10 MG tablet Take 1/2 to 1 po qd at night as needed.  . [DISCONTINUED] zolpidem (AMBIEN) 10 MG tablet Take 1/2 to 1 po qd at night as needed.    EXAM:  BP 124/66 mmHg  Pulse 81  Temp(Src) 98 F (36.7 C)  Wt 171 lb (77.565 kg)  SpO2 98%  Body mass index is 26.19 kg/(m^2).  GENERAL: vitals reviewed and listed above, alert, oriented, appears well hydrated and in no acute distress HEENT: atraumatic, conjunctiva  clear, no obvious abnormalities on inspection of external nose and ears NECK: no obvious masses on inspection palpation  LUNGS: clear to auscultation bilaterally, no wheezes, rales or rhonchi, good air movement CV: HRRR, no clubbing cyanosis or  peripheral edema nl cap refill  MS: moves all extremities without noticeable focal  abnormality PSYCH: pleasant and cooperative, no obvious depression or anxiety Lab Results  Component Value Date   WBC 13.9* 08/13/2014   HGB 13.8 08/13/2014  HCT 41.7 08/13/2014   PLT 419.0* 08/13/2014   GLUCOSE 84 08/28/2014   CHOL 195 05/17/2010   TRIG 96.0 05/17/2010   HDL 53.30 05/17/2010   LDLCALC 123* 05/17/2010   ALT 15 04/25/2012   AST 15 04/25/2012   NA 136 08/28/2014   K 4.0 08/28/2014   CL 105 08/28/2014   CREATININE 0.74 08/28/2014   BUN 22 08/28/2014   CO2 21 08/28/2014   TSH 0.614 08/08/2014   BP Readings from Last 3 Encounters:  11/27/14 124/66  08/28/14 112/66  08/13/14 116/76    ASSESSMENT AND PLAN:  Discussed the following assessment and plan:  Essential hypertension - doing very well  History of headache - resolved  since ht controlled   Medication management  Insomnia - refill  risk benefit  - Plan: zolpidem (AMBIEN) 10 MG tablet Wellness visit with labs   In dec 16 jan 17 -Patient advised to return or notify health care team  if symptoms worsen ,persist or new concerns arise.  Patient Instructions   continue same medications.   wellnesss visit  And labs  In December  January 17       Why follow it? Research shows. . Those who follow the Mediterranean diet have a reduced risk of heart disease  . The diet is associated with a reduced incidence of Parkinson's and Alzheimer's diseases . People following the diet may have longer life expectancies and lower rates of chronic diseases  . The Dietary Guidelines for Americans recommends the Mediterranean diet as an eating plan to promote health and prevent disease  What Is the Mediterranean Diet?  . Healthy eating plan based on typical foods and recipes of Mediterranean-style cooking . The diet is primarily a plant based diet; these foods should make up a majority of meals   Starches - Plant based foods should make up a majority of meals - They are an important sources of vitamins, minerals, energy, antioxidants, and fiber - Choose whole grains, foods high in fiber and minimally processed items  - Typical grain sources include wheat, oats, barley, corn, brown rice, bulgar, farro, millet, polenta, couscous  - Various types of beans include chickpeas, lentils, fava beans, black beans, white beans   Fruits  Veggies - Large quantities of antioxidant rich fruits & veggies; 6 or more servings  - Vegetables can be eaten raw or lightly drizzled with oil and cooked  - Vegetables common to the traditional Mediterranean Diet include: artichokes, arugula, beets, broccoli, brussel sprouts, cabbage, carrots, celery, collard greens, cucumbers, eggplant, kale, leeks, lemons, lettuce, mushrooms, okra, onions, peas, peppers, potatoes, pumpkin, radishes, rutabaga, shallots, spinach, sweet potatoes, turnips, zucchini - Fruits common to the Mediterranean Diet include: apples, apricots, avocados, cherries, clementines, dates, figs, grapefruits, grapes, melons, nectarines, oranges, peaches, pears, pomegranates, strawberries, tangerines  Fats - Replace butter and margarine with healthy oils, such as olive oil, canola oil, and tahini  -  Limit nuts to no more than a handful a day  - Nuts include walnuts, almonds, pecans, pistachios, pine nuts  - Limit or avoid candied, honey roasted or heavily salted nuts - Olives are central to the [employer] - can be eaten whole or used in a variety of dishes   Meats Protein - Limiting red meat: no more than a few times a month - When eating red meat: choose lean cuts and keep the portion to the size of deck of cards - Eggs: approx. 0 to 4 times a week  - Fish and  lean poultry: at least 2 a week  - Healthy protein sources include, chicken, [place], lean beef, lamb - Increase intake of seafood such as tuna, salmon, trout, mackerel, shrimp, scallops - Avoid or limit high fat processed meats such as sausage and bacon  Dairy - Include moderate amounts of low fat dairy products  - Focus on healthy dairy such as fat free yogurt, skim milk, low or reduced fat cheese - Limit dairy products higher in fat such as whole or 2% milk, cheese, ice cream  Alcohol - Moderate amounts of red wine is ok  - No more than 5 oz daily for women (all ages) and men older than age 47  - No more than 10 oz of wine daily for men younger than 38  Other - Limit sweets and other desserts  - Use herbs and spices instead of salt to flavor foods  - Herbs and spices common to the traditional Mediterranean Diet include: basil, bay leaves, chives, cloves, cumin, fennel, garlic, lavender, marjoram, mint, oregano, parsley, pepper, rosemary, sage, savory, sumac, tarragon, thyme   It's not just a diet, it's a lifestyle:  . The Mediterranean diet includes lifestyle factors typical of those in the region  . Foods, drinks and meals are best eaten with others and savored . Daily physical activity is important for overall good health . This could be strenuous exercise like running and aerobics . This could also be more leisurely activities such as walking, housework, yard-work, or taking the stairs . Moderation is the key; a  balanced and healthy diet accommodates most foods and drinks . Consider portion sizes and frequency of consumption of certain foods   Meal Ideas & Options:  . Breakfast:  o Whole wheat toast or whole wheat English muffins with peanut butter & hard boiled egg o Steel cut oats topped with apples & cinnamon and skim milk  o Fresh fruit: banana, strawberries, melon, berries, peaches  o Smoothies: strawberries, bananas, greek yogurt, peanut butter o Low fat greek yogurt with blueberries and granola  o Egg white omelet with spinach and mushrooms o Breakfast couscous: whole wheat couscous, apricots, skim milk, cranberries  . Sandwiches:  o Hummus and grilled vegetables (peppers, zucchini, squash) on whole wheat bread   o Grilled chicken on whole wheat pita with lettuce, tomatoes, cucumbers or tzatziki  o Tuna salad on whole wheat bread: tuna salad made with greek yogurt, olives, red peppers, capers, green onions o Garlic rosemary lamb pita: lamb sauted with garlic, rosemary, salt & pepper; add lettuce, cucumber, greek yogurt to pita - flavor with lemon juice and black pepper  . Seafood:  o Mediterranean grilled salmon, seasoned with garlic, basil, parsley, lemon juice and black pepper o Shrimp, lemon, and spinach whole-grain pasta salad made with low fat greek yogurt  o Seared scallops with lemon orzo  o Seared tuna steaks seasoned salt, pepper, coriander topped with tomato mixture of olives, tomatoes, olive oil, minced garlic, parsley, green onions and cappers  . Meats:  o Herbed greek chicken salad with kalamata olives, cucumber, feta  o Red bell peppers stuffed with spinach, bulgur, lean ground beef (or lentils) & topped with feta   o Kebabs: skewers of chicken, tomatoes, onions, zucchini, squash  o [place] burgers: made with red onions, mint, dill, lemon juice, feta cheese topped with roasted red peppers . Vegetarian o Cucumber salad: cucumbers, artichoke hearts, celery, red onion, feta  cheese, tossed in olive oil & lemon juice  o Hummus and whole  grain pita points with a greek salad (lettuce, tomato, feta, olives, cucumbers, red onion) o Lentil soup with celery, carrots made with vegetable broth, garlic, salt and pepper  o Tabouli salad: parsley, bulgur, mint, scallions, cucumbers, tomato, radishes, lemon juice, olive oil, salt and pepper.             Standley Brooking. Panosh M.D.

## 2014-11-27 NOTE — Patient Instructions (Signed)
continue same medications.   wellnesss visit  And labs  In December January 17       Why follow it? Research shows. . Those who follow the Mediterranean diet have a reduced risk of heart disease  . The diet is associated with a reduced incidence of Parkinson's and Alzheimer's diseases . People following the diet may have longer life expectancies and lower rates of chronic diseases  . The Dietary Guidelines for Americans recommends the Mediterranean diet as an eating plan to promote health and prevent disease  What Is the Mediterranean Diet?  . Healthy eating plan based on typical foods and recipes of Mediterranean-style cooking . The diet is primarily a plant based diet; these foods should make up a majority of meals   Starches - Plant based foods should make up a majority of meals - They are an important sources of vitamins, minerals, energy, antioxidants, and fiber - Choose whole grains, foods high in fiber and minimally processed items  - Typical grain sources include wheat, oats, barley, corn, brown rice, bulgar, farro, millet, polenta, couscous  - Various types of beans include chickpeas, lentils, fava beans, black beans, white beans   Fruits  Veggies - Large quantities of antioxidant rich fruits & veggies; 6 or more servings  - Vegetables can be eaten raw or lightly drizzled with oil and cooked  - Vegetables common to the traditional Mediterranean Diet include: artichokes, arugula, beets, broccoli, brussel sprouts, cabbage, carrots, celery, collard greens, cucumbers, eggplant, kale, leeks, lemons, lettuce, mushrooms, okra, onions, peas, peppers, potatoes, pumpkin, radishes, rutabaga, shallots, spinach, sweet potatoes, turnips, zucchini - Fruits common to the Mediterranean Diet include: apples, apricots, avocados, cherries, clementines, dates, figs, grapefruits, grapes, melons, nectarines, oranges, peaches, pears, pomegranates, strawberries, tangerines  Fats - Replace butter and  margarine with healthy oils, such as olive oil, canola oil, and tahini  - Limit nuts to no more than a handful a day  - Nuts include walnuts, almonds, pecans, pistachios, pine nuts  - Limit or avoid candied, honey roasted or heavily salted nuts - Olives are central to the [employer] - can be eaten whole or used in a variety of dishes   Meats Protein - Limiting red meat: no more than a few times a month - When eating red meat: choose lean cuts and keep the portion to the size of deck of cards - Eggs: approx. 0 to 4 times a week  - Fish and lean poultry: at least 2 a week  - Healthy protein sources include, chicken, [place], lean beef, lamb - Increase intake of seafood such as tuna, salmon, trout, mackerel, shrimp, scallops - Avoid or limit high fat processed meats such as sausage and bacon  Dairy - Include moderate amounts of low fat dairy products  - Focus on healthy dairy such as fat free yogurt, skim milk, low or reduced fat cheese - Limit dairy products higher in fat such as whole or 2% milk, cheese, ice cream  Alcohol - Moderate amounts of red wine is ok  - No more than 5 oz daily for women (all ages) and men older than age 51  - No more than 10 oz of wine daily for men younger than 28  Other - Limit sweets and other desserts  - Use herbs and spices instead of salt to flavor foods  - Herbs and spices common to the traditional Mediterranean Diet include: basil, bay leaves, chives, cloves, cumin, fennel, garlic, lavender, marjoram, mint, oregano, parsley, pepper, rosemary,  sage, savory, sumac, tarragon, thyme   It's not just a diet, it's a lifestyle:  . The Mediterranean diet includes lifestyle factors typical of those in the region  . Foods, drinks and meals are best eaten with others and savored . Daily physical activity is important for overall good health . This could be strenuous exercise like running and aerobics . This could also be more leisurely activities such as  walking, housework, yard-work, or taking the stairs . Moderation is the key; a balanced and healthy diet accommodates most foods and drinks . Consider portion sizes and frequency of consumption of certain foods   Meal Ideas & Options:  . Breakfast:  o Whole wheat toast or whole wheat English muffins with peanut butter & hard boiled egg o Steel cut oats topped with apples & cinnamon and skim milk  o Fresh fruit: banana, strawberries, melon, berries, peaches  o Smoothies: strawberries, bananas, greek yogurt, peanut butter o Low fat greek yogurt with blueberries and granola  o Egg white omelet with spinach and mushrooms o Breakfast couscous: whole wheat couscous, apricots, skim milk, cranberries  . Sandwiches:  o Hummus and grilled vegetables (peppers, zucchini, squash) on whole wheat bread   o Grilled chicken on whole wheat pita with lettuce, tomatoes, cucumbers or tzatziki  o Tuna salad on whole wheat bread: tuna salad made with greek yogurt, olives, red peppers, capers, green onions o Garlic rosemary lamb pita: lamb sauted with garlic, rosemary, salt & pepper; add lettuce, cucumber, greek yogurt to pita - flavor with lemon juice and black pepper  . Seafood:  o Mediterranean grilled salmon, seasoned with garlic, basil, parsley, lemon juice and black pepper o Shrimp, lemon, and spinach whole-grain pasta salad made with low fat greek yogurt  o Seared scallops with lemon orzo  o Seared tuna steaks seasoned salt, pepper, coriander topped with tomato mixture of olives, tomatoes, olive oil, minced garlic, parsley, green onions and cappers  . Meats:  o Herbed greek chicken salad with kalamata olives, cucumber, feta  o Red bell peppers stuffed with spinach, bulgur, lean ground beef (or lentils) & topped with feta   o Kebabs: skewers of chicken, tomatoes, onions, zucchini, squash  o [place] burgers: made with red onions, mint, dill, lemon juice, feta cheese topped with roasted red  peppers . Vegetarian o Cucumber salad: cucumbers, artichoke hearts, celery, red onion, feta cheese, tossed in olive oil & lemon juice  o Hummus and whole grain pita points with a greek salad (lettuce, tomato, feta, olives, cucumbers, red onion) o Lentil soup with celery, carrots made with vegetable broth, garlic, salt and pepper  o Tabouli salad: parsley, bulgur, mint, scallions, cucumbers, tomato, radishes, lemon juice, olive oil, salt and pepper.

## 2014-11-27 NOTE — Progress Notes (Signed)
Pre visit review using our clinic review tool, if applicable. No additional management support is needed unless otherwise documented below in the visit note. 

## 2014-12-25 ENCOUNTER — Telehealth: Payer: Self-pay | Admitting: Family Medicine

## 2014-12-25 MED ORDER — VERAPAMIL HCL ER 240 MG PO TBCR: 240.0000 mg | EXTENDED_RELEASE_TABLET | Freq: Every day | ORAL | Status: DC

## 2014-12-25 MED ORDER — SYNTHROID 150 MCG PO TABS: 150.0000 ug | ORAL_TABLET | Freq: Every day | ORAL | Status: DC

## 2014-12-25 NOTE — Telephone Encounter (Signed)
Sent to the pharmacy by e-scribe. 

## 2014-12-25 NOTE — Telephone Encounter (Signed)
Please send in 90 day refills for Synthroid 150 and Verapamil ER 240 to [facility], thanks

## 2014-12-26 ENCOUNTER — Telehealth: Payer: Self-pay | Admitting: Family Medicine

## 2014-12-26 NOTE — Telephone Encounter (Signed)
Usually dont rx at her weight Have make pt make appt     On her day off. Or when convenient .

## 2014-12-26 NOTE — Telephone Encounter (Signed)
Pt aware.

## 2014-12-26 NOTE — Telephone Encounter (Signed)
She really wants to lose some weight and would like to try Phentermine. She watches her BP closely and has been averaging in the 110s over 70s. Could you call this in for her? Thanks

## 2015-01-06 ENCOUNTER — Encounter: Payer: Self-pay | Admitting: Internal Medicine

## 2015-01-06 ENCOUNTER — Ambulatory Visit (INDEPENDENT_AMBULATORY_CARE_PROVIDER_SITE_OTHER): Payer: Managed Care, Other (non HMO) | Admitting: Internal Medicine

## 2015-01-06 VITALS — BP 114/74 | Temp 97.6°F | Ht 67.75 in | Wt 170.7 lb

## 2015-01-06 DIAGNOSIS — E663 Overweight: Secondary | ICD-10-CM

## 2015-01-06 DIAGNOSIS — Z79899 Other long term (current) drug therapy: Secondary | ICD-10-CM

## 2015-01-06 DIAGNOSIS — I10 Essential (primary) hypertension: Secondary | ICD-10-CM

## 2015-01-06 MED ORDER — PHENTERMINE HCL 37.5 MG PO CAPS: 37.5000 mg | ORAL_CAPSULE | ORAL | Status: DC

## 2015-01-06 NOTE — Patient Instructions (Addendum)
Medication not usually  indicated for bmi below 27   Not very effective  as opposed to risk . But possibly can help  .   Please track  Times and strategies  as discussed  The closer you are to  Goal weight the longer it takes to get there.  Exercise  . aerobic and resistance training.  Intensify as you are doing.   Take medication in am to not effect sleep.  ROV in 4-6 weeks or as needed  Usual  course of med rx is 90 days .

## 2015-01-06 NOTE — Progress Notes (Signed)
Pre visit review using our clinic review tool, if applicable. No additional management support is needed unless otherwise documented below in the visit note.  Chief Complaint  Patient presents with  . Discuss Weight Loss    HPI: Christina [name] 54 y.o. who comes in today to discuss possible interventions to help with her weight loss to reach her goal. She's never been particularly overweight. Over the last year she has gained weight and worked very hard to lose 7 pounds that she gained all back after the holidays a Christmas. She exercises on a regular basis taking a resistance class and walking on a regular basis. She tries to eat healthy in general occasionally lapses small snacks per day while at work and then sometimes they have to eat late at night.  She is frustrated she hasn't been able to do better with decreasing her abdominal girth. Asks about appetite suppressants.  Her blood pressures been well under control as well as any headaches. Thyroid function is stable. ROS: See pertinent positives and negatives per HPI. No chest pain shortness of breath other limitations with exercise. Except for time  Past Medical History  Diagnosis Date  . Allergic rhinitis   . Hyperthyroidism     s/p rai 8/06 now hypothyroid Dr. Suzette Battiest  . Grave's disease   . Hypothyroid   . H/O menorrhagia 2012  . Vitamin D deficiency   . Fibroid   . H/O insomnia   . Infertility, female   . H/O Graves' disease   . History of chicken pox   . Menorrhagia     Had Novasure  . Polyp of cervix     Endometriel polyp. JO  . Fibroids   . Vitamin D deficiency     Family History  Problem Relation Age of Onset  . Diabetes Mother   . Hypertension    . Prostate cancer      grandfather  . Hypertension Father     History   Social History  . Marital Status: Married    Spouse Name: N/A  . Number of Children: N/A  . Years of Education: N/A   Social History Main Topics  . Smoking status: Never Smoker   . Smokeless  tobacco: Not on file  . Alcohol Use: Yes     Comment: socially  . Drug Use: Not on file  . Sexual Activity: Yes   Other Topics Concern  . 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 ibuprofen (ADVIL,MOTRIN) 200 MG tablet Take 200 mg by mouth every 6 (six) hours as needed.    Marland Kitchen lisinopril (PRINIVIL,ZESTRIL) 20 MG tablet Take 1 tablet (20 mg total) by mouth daily. 90 tablet 3  . SYNTHROID 150 MCG tablet Take 1 tablet (150 mcg total) by mouth daily before breakfast. 90 tablet 1  . verapamil (CALAN-SR) 240 MG CR tablet Take 1 tablet (240 mg total) by mouth at bedtime. 90 tablet 1  . zolpidem (AMBIEN) 10 MG tablet Take 1/2 to 1 po qd at night as needed. 90 tablet 1   No facility-administered medications prior to visit.     EXAM:  BP 114/74 mmHg  Temp(Src) 97.6 F (36.4 C) (Oral)  Ht 5' 7.75" (1.721 m)  Wt 170 lb 11.2 oz (77.429 kg)  BMI 26.14 kg/m2  Body mass index  is 26.14 kg/(m^2).  GENERAL: vitals reviewed and listed above, alert, oriented, appears well hydrated and in no acute distress HEENT: atraumatic, conjunctiva  clear, no obvious abnormalities on inspection of external nose and earsMS: moves all extremities without noticeable focal  abnormality PSYCH: pleasant and cooperative, no obvious depression or anxiety  ASSESSMENT AND PLAN:  Discussed the following assessment and plan:  Overweight (BMI 25.0-29.9)  Medication management  Essential hypertension - controlled  bmi 63 and above  Your bmi is 26 almost ideal . Risk of med ht aggravation  Sleep.  Only use for 8-12 weeks at most  . Discussed risk-benefit and use of medication in the short run for risky times and to have a strategy plan of eating healthy and continuing her exercise. Taking medicine correctly to avoid significant side effects such as hypertension and insomnia. Have her  come back in about 6 weeks and see how she is doing. Did warn her that the closer she is to ideal weight less likely she is to recheck in a short-term goal. -Patient advised to return or notify health care team  if symptoms worsen ,persist or new concerns arise.  Patient Instructions  Medication not usually  indicated for bmi below 27   Not very effective  as opposed to risk . But possibly can help  .   Please track  Times and strategies  as discussed  The closer you are to  Goal weight the longer it takes to get there.  Exercise  . aerobic and resistance training.  Intensify as you are doing.   Take medication in am to not effect sleep.  ROV in 4-6 weeks or as needed  Usual  course of med rx is 90 days .     Standley Brooking. Kealey Kemmer M.D.

## 2015-01-07 ENCOUNTER — Ambulatory Visit: Payer: Managed Care, Other (non HMO) | Admitting: Internal Medicine

## 2015-02-03 ENCOUNTER — Telehealth: Payer: Self-pay | Admitting: Family Medicine

## 2015-02-03 DIAGNOSIS — G4700 Insomnia, unspecified: Secondary | ICD-10-CM

## 2015-02-04 ENCOUNTER — Other Ambulatory Visit: Payer: Self-pay | Admitting: Family Medicine

## 2015-02-05 NOTE — Telephone Encounter (Signed)
Please document how much was left in bottle and then tell  [facility] ok to refill  rx early this time.

## 2015-02-05 NOTE — Telephone Encounter (Signed)
Spoke to Dr. Sarajane Jews who stated there was about three weeks left of the medication out of a 90 day supply.  WP has authorized an early refill.  Spoke to the pharmacy and gave authorization.  Pharmacy stated there should have been 20 days left in her bottle.

## 2015-02-05 NOTE — Telephone Encounter (Signed)
Apparently the patient left her bottle of Ambien in the hotel during a trip to [place] last weekend. Please tell [facility] it is okay for an early refill for today

## 2015-03-13 ENCOUNTER — Encounter: Payer: Self-pay | Admitting: *Deleted

## 2015-05-08 ENCOUNTER — Other Ambulatory Visit: Payer: Self-pay

## 2015-05-08 DIAGNOSIS — Z1231 Encounter for screening mammogram for malignant neoplasm of breast: Secondary | ICD-10-CM

## 2015-05-13 ENCOUNTER — Ambulatory Visit (INDEPENDENT_AMBULATORY_CARE_PROVIDER_SITE_OTHER): Payer: Managed Care, Other (non HMO) | Admitting: Internal Medicine

## 2015-05-13 ENCOUNTER — Encounter: Payer: Self-pay | Admitting: Internal Medicine

## 2015-05-13 ENCOUNTER — Other Ambulatory Visit: Payer: Managed Care, Other (non HMO)

## 2015-05-13 VITALS — BP 124/88 | Temp 98.4°F | Wt 173.1 lb

## 2015-05-13 DIAGNOSIS — I10 Essential (primary) hypertension: Secondary | ICD-10-CM | POA: Diagnosis not present

## 2015-05-13 DIAGNOSIS — E039 Hypothyroidism, unspecified: Secondary | ICD-10-CM

## 2015-05-13 DIAGNOSIS — Z13220 Encounter for screening for lipoid disorders: Secondary | ICD-10-CM

## 2015-05-13 DIAGNOSIS — Z23 Encounter for immunization: Secondary | ICD-10-CM

## 2015-05-13 DIAGNOSIS — G4700 Insomnia, unspecified: Secondary | ICD-10-CM

## 2015-05-13 DIAGNOSIS — I519 Heart disease, unspecified: Principal | ICD-10-CM

## 2015-05-13 LAB — LIPID PANEL
CHOLESTEROL: 200 mg/dL (ref 0–200)
HDL: 43.6 mg/dL (ref >39.00)
NONHDL: 156.09
Total CHOL/HDL Ratio: 5
Triglycerides: 366 mg/dL — ABNORMAL HIGH (ref 0.0–149.0)
VLDL: 73.2 mg/dL — AB (ref 0.0–40.0)

## 2015-05-13 LAB — BASIC METABOLIC PANEL
BUN: 15 mg/dL (ref 6–23)
CO2: 26 mEq/L (ref 19–32)
CREATININE: 0.71 mg/dL (ref 0.40–1.20)
Calcium: 9.7 mg/dL (ref 8.4–10.5)
Chloride: 103 mEq/L (ref 96–112)
GFR: 90.89 mL/min (ref >60.00)
Glucose, Bld: 80 mg/dL (ref 70–99)
POTASSIUM: 4.2 meq/L (ref 3.5–5.1)
Sodium: 137 mEq/L (ref 135–145)

## 2015-05-13 LAB — CBC WITH DIFFERENTIAL/PLATELET
Basophils Absolute: 0 10*3/uL (ref 0.0–0.1)
Basophils Relative: 0 % (ref 0.0–3.0)
EOS ABS: 1.7 10*3/uL — AB (ref 0.0–0.7)
Eosinophils Relative: 1.9 % (ref 0.0–5.0)
HEMATOCRIT: 39 % (ref 36.0–46.0)
HEMOGLOBIN: 13 g/dL (ref 12.0–15.0)
Lymphocytes Relative: 6.7 % — ABNORMAL LOW (ref 12.0–46.0)
Lymphs Abs: 6 10*3/uL — ABNORMAL HIGH (ref 0.7–4.0)
MCHC: 33.3 g/dL (ref 30.0–36.0)
MCV: 95.4 fl (ref 78.0–100.0)
MONO ABS: 2.8 10*3/uL — AB (ref 0.1–1.0)
Monocytes Relative: 3.1 % (ref 3.0–12.0)
Neutro Abs: 78.4 10*3/uL — ABNORMAL HIGH (ref 1.4–7.7)
Neutrophils Relative %: 88.3 % — ABNORMAL HIGH (ref 43.0–77.0)
Platelets: 313 10*3/uL (ref 150.0–400.0)
RBC: 4.09 Mil/uL (ref 3.87–5.11)
RDW: 17 % — AB (ref 11.5–15.5)

## 2015-05-13 LAB — HEPATIC FUNCTION PANEL
ALK PHOS: 52 U/L (ref 39–117)
ALT: 20 U/L (ref 0–35)
AST: 20 U/L (ref 0–37)
Albumin: 4.1 g/dL (ref 3.5–5.2)
BILIRUBIN DIRECT: 0.1 mg/dL (ref 0.0–0.3)
BILIRUBIN TOTAL: 0.3 mg/dL (ref 0.2–1.2)
Total Protein: 7.2 g/dL (ref 6.0–8.3)

## 2015-05-13 LAB — TSH: TSH: 2.49 u[IU]/mL (ref 0.35–4.50)

## 2015-05-13 MED ORDER — ZOLPIDEM TARTRATE 10 MG PO TABS: ORAL_TABLET | ORAL | Status: DC

## 2015-05-13 MED ORDER — PHENTERMINE HCL 37.5 MG PO CAPS: 37.5000 mg | ORAL_CAPSULE | ORAL | Status: DC

## 2015-05-13 NOTE — Progress Notes (Signed)
Pre visit review using our clinic review tool, if applicable. No additional management support is needed unless otherwise documented below in the visit note.  Chief Complaint  Patient presents with  . Follow-up    HPI: Christina [name] 54 y.o. comes for fu medication evaluation   bp  has been good on both medicines no side effects noted Synthroid ok taking daily would like her thyroid rechecked last done about 10 months ago. Phentermine. Has a few last hasn't been taking it regularly when she takes it on work days feels a little different and says things she normally wouldn't. No other side effects. Does want to do healthy things begin exercising had some delays because of family obligations. Has some questions about calcium vitamin D doesn't want interfere with her Synthroid. Still taking the Ambien a half to 1 at night can she get a refill while she is here. ROS: See pertinent positives and negatives per HPI. No cp sob syncope   Past Medical History  Diagnosis Date  . Allergic rhinitis   . Hyperthyroidism     s/p rai 8/06 now hypothyroid Dr. Suzette Battiest  . Grave's disease   . Hypothyroid   . H/O menorrhagia 2012  . Vitamin D deficiency   . Fibroid   . H/O insomnia   . Infertility, female   . H/O Graves' disease   . History of chicken pox   . Menorrhagia     Had Novasure  . Polyp of cervix     Endometriel polyp. JO  . Fibroids   . Vitamin D deficiency     Family History  Problem Relation Age of Onset  . Diabetes Mother   . Hypertension    . Prostate cancer      grandfather  . Hypertension Father     Social History   Social History  . Marital Status: Married    Spouse Name: N/A  . Number of Children: N/A  . Years of Education: N/A   Social History Main Topics  . Smoking status: Never Smoker   . Smokeless tobacco: None  . Alcohol Use: Yes     Comment: socially  . Drug Use: None  . Sexual Activity: Yes   Other Topics Concern  . 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 ibuprofen (ADVIL,MOTRIN) 200 MG tablet Take 200 mg by mouth every 6 (six) hours as needed.    Marland Kitchen lisinopril (PRINIVIL,ZESTRIL) 20 MG tablet Take 1 tablet (20 mg total) by mouth daily. 90 tablet 3  . SYNTHROID 150 MCG tablet Take 1 tablet (150 mcg total) by mouth daily before breakfast. 90 tablet 1  . verapamil (CALAN-SR) 240 MG CR tablet Take 1 tablet (240 mg total) by mouth at bedtime. 90 tablet 1  . phentermine 37.5 MG capsule Take 1 capsule (37.5 mg total) by mouth every morning. 30 capsule 0  . zolpidem (AMBIEN) 10 MG tablet Take 1/2 to 1 po qd at night as needed. 90 tablet 1   No facility-administered medications prior to visit.     EXAM:  BP 124/88 mmHg  Temp(Src) 98.4 F (36.9 C) (Oral)  Wt 173 lb 1.6 oz (78.518 kg)  Body mass index is 26.51 kg/(m^2).  GENERAL: vitals reviewed and listed above, alert, oriented, appears well hydrated and in no acute distress HEENT:  atraumatic, conjunctiva  clear, no obvious abnormalities on inspection of external nose and ears NECK: no obvious masses on inspection palpation  LUNGS: clear to auscultation bilaterally, no wheezes, rales or rhonchi, good air movement CV: HRRR, no clubbing cyanosis or  peripheral edema nl cap refill abdomen soft without organomegaly guarding or rebound. MS: moves all extremities without noticeable focal  abnormality PSYCH: pleasant and cooperative, no obvious depression or anxiety Wt Readings from Last 3 Encounters:  05/13/15 173 lb 1.6 oz (78.518 kg)  01/06/15 170 lb 11.2 oz (77.429 kg)  11/27/14 171 lb (77.565 kg)   BP Readings from Last 3 Encounters:  05/13/15 124/88  01/06/15 114/74  11/27/14 124/66      ASSESSMENT AND PLAN:  Discussed the following assessment and plan:  Essential hypertension - Plan: Basic metabolic panel, CBC with Differential/Platelet,  TSH, Hepatic function panel, Lipid panel  Hypothyroidism, unspecified hypothyroidism type - Plan: Basic metabolic panel, CBC with Differential/Platelet, TSH, Hepatic function panel, Lipid panel  Insomnia - refill  risk benefit  - Plan: zolpidem (AMBIEN) 10 MG tablet  Encounter for screening for lipid disorder - Plan: Basic metabolic panel, CBC with Differential/Platelet, TSH, Hepatic function panel, Lipid panel  Need for prophylactic vaccination and inoculation against influenza - Plan: Flu Vaccine QUAD 36+ mos PF IM (Fluarix & Fluzone Quad PF) Discussed risk-benefit of each medicine calcium vitamin D better in food and supplements her age. Same medications no change perhaps try the phentermine just on the weekend and plan ahead. Risk-benefit. -Patient advised to return or notify health care team  if symptoms worsen ,persist or new concerns arise.  Patient Instructions  Try taking appetite Supressant on weekend....  Will notify you  of labs when available.   Can get enough calcium in foods 1200 mg per day  Equivalent vit d 800 iu per day  May be enough in milk dairy or sun exposures .    Bone Health Bones protect organs, store calcium, and anchor muscles. Good health habits, such as eating nutritious foods and exercising regularly, are important for maintaining healthy bones. They can also help to prevent a condition that causes bones to lose density and become weak and brittle (osteoporosis). WHY IS BONE MASS IMPORTANT? Bone mass refers to the amount of bone tissue that you have. The higher your bone mass, the stronger your bones. An important step toward having healthy bones throughout life is to have strong and dense bones during childhood. A young adult who has a high bone mass is more likely to have a high bone mass later in life. Bone mass at its greatest it is called peak bone mass. A large decline in bone mass occurs in older adults. In women, it occurs about the time of menopause.  During this time, it is important to practice good health habits, because if more bone is lost than what is replaced, the bones will become less healthy and more likely to break (fracture). If you find that you have a low bone mass, you may be able to prevent osteoporosis or further bone loss by changing your diet and lifestyle. HOW CAN I FIND OUT IF MY BONE MASS IS LOW? Bone mass can be measured with an X-ray test that is called a bone mineral density (BMD) test. This test is recommended for all women who are age 63 or older. It may also be recommended for men who are age 63 or older, or for people who are more likely to develop osteoporosis due to:  Having bones that break easily.  Having a long-term disease that weakens bones, such as kidney disease or rheumatoid arthritis.  Having menopause earlier than normal.  Taking medicine that weakens bones, such as steroids, thyroid hormones, or hormone treatment for breast cancer or prostate cancer.  Smoking.  Drinking three or more alcoholic drinks each day. WHAT ARE THE NUTRITIONAL RECOMMENDATIONS FOR HEALTHY BONES? To have healthy bones, you need to get enough of the right minerals and vitamins. Most nutrition experts recommend getting these nutrients from the foods that you eat. Nutritional recommendations vary from person to person. Ask your health care provider what is healthy for you. Here are some general guidelines. Calcium Recommendations Calcium is the most important (essential) mineral for bone health. Most people can get enough calcium from their diet, but supplements may be recommended for people who are at risk for osteoporosis. Good sources of calcium include:  Dairy products, such as low-fat or nonfat milk, cheese, and yogurt.  Dark green leafy vegetables, such as bok choy and broccoli.  Calcium-fortified foods, such as orange juice, cereal, bread, soy beverages, and tofu products.  Nuts, such as almonds. Follow these  recommended amounts for daily calcium intake:  Children, age 35-3: 700 mg.  Children, age 56-8: 1,000 mg.  Children, age 568-13: 1,300 mg.  Teens, age 78-18: 1,300 mg.  Adults, age 561-50: 1,000 mg.  Adults, age 33-70:  Men: 1,000 mg.  Women: 1,200 mg.  Adults, age 63 or older: 1,200 mg.  Pregnant and breastfeeding females:  Teens: 1,300 mg.  Adults: 1,000 mg. Vitamin D Recommendations Vitamin D is the most essential vitamin for bone health. It helps the body to absorb calcium. Sunlight stimulates the skin to make vitamin D, so be sure to get enough sunlight. If you live in a cold climate or you do not get outside often, your health care provider may recommend that you take vitamin D supplements. Good sources of vitamin D in your diet include:  Egg yolks.  Saltwater fish.  Milk and cereal fortified with vitamin D. Follow these recommended amounts for daily vitamin D intake:  Children and teens, age 35-18: 600 international units.  Adults, age 57 or younger: 400-800 international units.  Adults, age 55 or older: 800-1,000 international units. Other Nutrients Other nutrients for bone health include:  Phosphorus. This mineral is found in meat, poultry, dairy foods, nuts, and legumes. The recommended daily intake for adult men and adult women is 700 mg.  Magnesium. This mineral is found in seeds, nuts, dark green vegetables, and legumes. The recommended daily intake for adult men is 400-420 mg. For adult women, it is 310-320 mg.  Vitamin K. This vitamin is found in green leafy vegetables. The recommended daily intake is 120 mg for adult men and 90 mg for adult women. WHAT TYPE OF PHYSICAL ACTIVITY IS BEST FOR BUILDING AND MAINTAINING HEALTHY BONES? Weight-bearing and strength-building activities are important for building and maintaining peak bone mass. Weight-bearing activities cause muscles and bones to work against gravity. Strength-building activities increases muscle  strength that supports bones. Weight-bearing and muscle-building activities include:  Walking and hiking.  Jogging and running.  Dancing.  Gym exercises.  Lifting weights.  Tennis and racquetball.  Climbing stairs.  Aerobics. Adults should get at least 30 minutes of moderate physical activity on most days. Children should get at least 60 minutes of moderate physical activity on most days. Ask your health care provide what type of exercise is best for you. WHERE CAN I  FIND MORE INFORMATION? For more information, check out the following websites:  [place]: YardHomes.se  [employer] of Health: http://www.niams.AnonymousEar.fr.asp   This information is not intended to replace advice given to you by your health care provider. Make sure you discuss any questions you have with your health care provider.   Document Released: 10/15/2003 Document Revised: 12/09/2014 Document Reviewed: 07/30/2014 Elsevier Interactive Patient Education 2016 Olivia K. Shantil Vallejo M.D.

## 2015-05-13 NOTE — Patient Instructions (Addendum)
Try taking appetite Supressant on weekend....  Will notify you  of labs when available.   Can get enough calcium in foods 1200 mg per day  Equivalent vit d 800 iu per day  May be enough in milk dairy or sun exposures .    Bone Health Bones protect organs, store calcium, and anchor muscles. Good health habits, such as eating nutritious foods and exercising regularly, are important for maintaining healthy bones. They can also help to prevent a condition that causes bones to lose density and become weak and brittle (osteoporosis). WHY IS BONE MASS IMPORTANT? Bone mass refers to the amount of bone tissue that you have. The higher your bone mass, the stronger your bones. An important step toward having healthy bones throughout life is to have strong and dense bones during childhood. A young adult who has a high bone mass is more likely to have a high bone mass later in life. Bone mass at its greatest it is called peak bone mass. A large decline in bone mass occurs in older adults. In women, it occurs about the time of menopause. During this time, it is important to practice good health habits, because if more bone is lost than what is replaced, the bones will become less healthy and more likely to break (fracture). If you find that you have a low bone mass, you may be able to prevent osteoporosis or further bone loss by changing your diet and lifestyle. HOW CAN I FIND OUT IF MY BONE MASS IS LOW? Bone mass can be measured with an X-ray test that is called a bone mineral density (BMD) test. This test is recommended for all women who are age 71 or older. It may also be recommended for men who are age 70 or older, or for people who are more likely to develop osteoporosis due to:  Having bones that break easily.  Having a long-term disease that weakens bones, such as kidney disease or rheumatoid arthritis.  Having menopause earlier than normal.  Taking medicine that weakens bones, such as steroids,  thyroid hormones, or hormone treatment for breast cancer or prostate cancer.  Smoking.  Drinking three or more alcoholic drinks each day. WHAT ARE THE NUTRITIONAL RECOMMENDATIONS FOR HEALTHY BONES? To have healthy bones, you need to get enough of the right minerals and vitamins. Most nutrition experts recommend getting these nutrients from the foods that you eat. Nutritional recommendations vary from person to person. Ask your health care provider what is healthy for you. Here are some general guidelines. Calcium Recommendations Calcium is the most important (essential) mineral for bone health. Most people can get enough calcium from their diet, but supplements may be recommended for people who are at risk for osteoporosis. Good sources of calcium include:  Dairy products, such as low-fat or nonfat milk, cheese, and yogurt.  Dark green leafy vegetables, such as bok choy and broccoli.  Calcium-fortified foods, such as orange juice, cereal, bread, soy beverages, and tofu products.  Nuts, such as almonds. Follow these recommended amounts for daily calcium intake:  Children, age 13-3: 700 mg.  Children, age 67-8: 1,000 mg.  Children, age 39-13: 1,300 mg.  Teens, age 90-18: 1,300 mg.  Adults, age 7-50: 1,000 mg.  Adults, age 136-70:  Men: 1,000 mg.  Women: 1,200 mg.  Adults, age 678 or older: 1,200 mg.  Pregnant and breastfeeding females:  Teens: 1,300 mg.  Adults: 1,000 mg. Vitamin D Recommendations Vitamin D is the most essential vitamin for bone health.  It helps the body to absorb calcium. Sunlight stimulates the skin to make vitamin D, so be sure to get enough sunlight. If you live in a cold climate or you do not get outside often, your health care provider may recommend that you take vitamin D supplements. Good sources of vitamin D in your diet include:  Egg yolks.  Saltwater fish.  Milk and cereal fortified with vitamin D. Follow these recommended amounts for daily  vitamin D intake:  Children and teens, age 69-18: 600 international units.  Adults, age 95 or younger: 400-800 international units.  Adults, age 66 or older: 800-1,000 international units. Other Nutrients Other nutrients for bone health include:  Phosphorus. This mineral is found in meat, poultry, dairy foods, nuts, and legumes. The recommended daily intake for adult men and adult women is 700 mg.  Magnesium. This mineral is found in seeds, nuts, dark green vegetables, and legumes. The recommended daily intake for adult men is 400-420 mg. For adult women, it is 310-320 mg.  Vitamin K. This vitamin is found in green leafy vegetables. The recommended daily intake is 120 mg for adult men and 90 mg for adult women. WHAT TYPE OF PHYSICAL ACTIVITY IS BEST FOR BUILDING AND MAINTAINING HEALTHY BONES? Weight-bearing and strength-building activities are important for building and maintaining peak bone mass. Weight-bearing activities cause muscles and bones to work against gravity. Strength-building activities increases muscle strength that supports bones. Weight-bearing and muscle-building activities include:  Walking and hiking.  Jogging and running.  Dancing.  Gym exercises.  Lifting weights.  Tennis and racquetball.  Climbing stairs.  Aerobics. Adults should get at least 30 minutes of moderate physical activity on most days. Children should get at least 60 minutes of moderate physical activity on most days. Ask your health care provide what type of exercise is best for you. WHERE CAN I FIND MORE INFORMATION? For more information, check out the following websites:  [place]: YardHomes.se  [employer]: http://www.niams.AnonymousEar.fr.asp   This information is not intended to replace advice given to you by your health care provider. Make sure you discuss any questions you have with your  health care provider.   Document Released: 10/15/2003 Document Revised: 12/09/2014 Document Reviewed: 07/30/2014 Elsevier Interactive Patient Education [employer].

## 2015-05-14 ENCOUNTER — Telehealth: Payer: Self-pay | Admitting: Hematology and Oncology

## 2015-05-14 ENCOUNTER — Other Ambulatory Visit: Payer: Self-pay | Admitting: Hematology and Oncology

## 2015-05-14 DIAGNOSIS — D72829 Elevated white blood cell count, unspecified: Secondary | ICD-10-CM | POA: Insufficient documentation

## 2015-05-14 LAB — PATHOLOGIST SMEAR REVIEW

## 2015-05-14 LAB — LDL CHOLESTEROL, DIRECT: Direct LDL: 119 mg/dL

## 2015-05-14 NOTE — Telephone Encounter (Signed)
new patient appt-s/w Dr. Regis Bill and gave np appt for 10/07 @ 7:30 lab, 8 Dr. Alvy Bimler. Per Dr. Regis Bill will make patient aware of appt contact information was given

## 2015-05-15 ENCOUNTER — Ambulatory Visit (HOSPITAL_BASED_OUTPATIENT_CLINIC_OR_DEPARTMENT_OTHER): Payer: Managed Care, Other (non HMO) | Admitting: Hematology and Oncology

## 2015-05-15 ENCOUNTER — Telehealth: Payer: Self-pay | Admitting: Hematology and Oncology

## 2015-05-15 ENCOUNTER — Other Ambulatory Visit (HOSPITAL_COMMUNITY)
Admission: RE | Admit: 2015-05-15 | Discharge: 2015-05-15 | Disposition: A | Discharge status: Home / Self Care | Payer: Managed Care, Other (non HMO) | Source: Ambulatory Visit | Attending: Hematology and Oncology | Admitting: Hematology and Oncology

## 2015-05-15 ENCOUNTER — Other Ambulatory Visit (HOSPITAL_BASED_OUTPATIENT_CLINIC_OR_DEPARTMENT_OTHER): Payer: Managed Care, Other (non HMO)

## 2015-05-15 ENCOUNTER — Other Ambulatory Visit: Payer: Self-pay | Admitting: Hematology and Oncology

## 2015-05-15 ENCOUNTER — Other Ambulatory Visit: Payer: Self-pay | Admitting: Neurology

## 2015-05-15 ENCOUNTER — Ambulatory Visit: Payer: Managed Care, Other (non HMO)

## 2015-05-15 ENCOUNTER — Encounter: Payer: Self-pay | Admitting: Hematology and Oncology

## 2015-05-15 VITALS — BP 123/75 | HR 75 | Temp 97.7°F | Resp 20 | Ht 67.0 in | Wt 173.0 lb

## 2015-05-15 DIAGNOSIS — D72829 Elevated white blood cell count, unspecified: Secondary | ICD-10-CM | POA: Insufficient documentation

## 2015-05-15 DIAGNOSIS — E790 Hyperuricemia without signs of inflammatory arthritis and tophaceous disease: Secondary | ICD-10-CM | POA: Insufficient documentation

## 2015-05-15 DIAGNOSIS — D759 Disease of blood and blood-forming organs, unspecified: Secondary | ICD-10-CM | POA: Insufficient documentation

## 2015-05-15 LAB — CBC WITH DIFFERENTIAL/PLATELET
HEMATOCRIT: 38.6 % (ref 34.8–46.6)
HGB: 12.4 g/dL (ref 11.6–15.9)
MCH: 30.3 pg (ref 25.1–34.0)
MCHC: 32.1 g/dL (ref 31.5–36.0)
MCV: 94.2 fL (ref 79.5–101.0)
PLATELETS: 310 10*3/uL (ref 145–400)
RBC: 4.1 10*6/uL (ref 3.70–5.45)
RDW: 16.6 % — ABNORMAL HIGH (ref 11.2–14.5)
WBC: 86.7 10*3/uL (ref 3.9–10.3)

## 2015-05-15 LAB — MANUAL DIFFERENTIAL
ALC: 2.6 10*3/uL (ref 0.9–3.3)
ANC (CHCC MAN DIFF): 78.9 10*3/uL — AB (ref 1.5–6.5)
BAND NEUTROPHILS: 33 % — AB (ref 0–10)
BLASTS: 1 % — AB (ref 0–0)
Basophil: 1 % (ref 0–2)
EOS: 1 % (ref 0–7)
LYMPH: 3 % — AB (ref 14–49)
METAMYELOCYTES PCT: 11 % — AB (ref 0–0)
MONO: 2 % (ref 0–14)
MYELOCYTES: 8 % — AB (ref 0–0)
Other Cell: 0 % (ref 0–0)
PLT EST: ADEQUATE
PROMYELO: 1 % — AB (ref 0–0)
SEG: 39 % (ref 38–77)
Variant Lymph: 0 % (ref 0–0)
nRBC: 1 % — ABNORMAL HIGH (ref 0–0)

## 2015-05-15 LAB — COMPREHENSIVE METABOLIC PANEL (CC13)
ALT: 18 U/L (ref 0–55)
ANION GAP: 10 meq/L (ref 3–11)
AST: 19 U/L (ref 5–34)
Albumin: 3.9 g/dL (ref 3.5–5.0)
Alkaline Phosphatase: 57 U/L (ref 40–150)
BUN: 14.4 mg/dL (ref 7.0–26.0)
CHLORIDE: 106 meq/L (ref 98–109)
CO2: 21 meq/L — AB (ref 22–29)
Calcium: 9.3 mg/dL (ref 8.4–10.4)
Creatinine: 0.8 mg/dL (ref 0.6–1.1)
EGFR: 86 mL/min/{1.73_m2} — AB (ref >90)
Glucose: 94 mg/dl (ref 70–140)
Potassium: 4.2 mEq/L (ref 3.5–5.1)
SODIUM: 137 meq/L (ref 136–145)
Total Bilirubin: 0.35 mg/dL (ref 0.20–1.20)
Total Protein: 6.9 g/dL (ref 6.4–8.3)

## 2015-05-15 LAB — BONE MARROW EXAM

## 2015-05-15 LAB — LACTATE DEHYDROGENASE (CC13): LDH: 606 U/L — AB (ref 125–245)

## 2015-05-15 LAB — URIC ACID (CC13): Uric Acid, Serum: 9.2 mg/dl — ABNORMAL HIGH (ref 2.6–7.4)

## 2015-05-15 MED ORDER — ALLOPURINOL 300 MG PO TABS: 300.0000 mg | ORAL_TABLET | Freq: Every day | ORAL | Status: DC

## 2015-05-15 MED ORDER — HYDROXYUREA 500 MG PO CAPS: 1500.0000 mg | ORAL_CAPSULE | Freq: Every day | ORAL | Status: DC

## 2015-05-15 NOTE — Assessment & Plan Note (Signed)
She has asymptomatic hyperuricemia. I will start her on allopurinol and encourage her to increase oral fluid intake

## 2015-05-15 NOTE — Telephone Encounter (Signed)
Gave and printed appt sched and avs for pt fjor OCT

## 2015-05-15 NOTE — Patient Instructions (Signed)
Dasatinib oral tablet What is this medicine? DASATINIB (da SA ti nib) is a medicine that targets specific proteins in cancer cells and stops the cancer cells from growing. It is used to treat some kinds of leukemia. This medicine may be used for other purposes; ask your health care provider or pharmacist if you have questions. What should I tell my health care provider before I take this medicine? They need to know if you have any of these conditions: -bleeding problems -heart disease -immune system problems -irregular heartbeat -liver disease -low levels of magnesium or potassium in the blood -an unusual or allergic reaction to dasatinib, lactose, other medicines, foods, dyes, or preservatives -pregnant or trying to get pregnant -breast-feeding How should I use this medicine? Take this medicine by mouth with a glass of water. Follow the directions on the prescription label. You can take it with or without food. Do not take with grapefruit juice. Do not cut, crush or chew this medicine. Take your medicine at regular intervals. Do not take your medicine more often than directed. Do not stop taking except on your doctor's advice. Avoid taking antacids containing aluminum, calcium, or magnesium within 2 hours of taking this medicine. You can take such antacids up to 2 hours before or 2 hours after this medicine. Avoid taking all other medicines that reduce stomach acid. Talk to your pediatrician regarding the use of this medicine in children. Special care may be needed. Overdosage: If you think you have taken too much of this medicine contact a poison control center or emergency room at once. NOTE: This medicine is only for you. Do not share this medicine with others. What if I miss a dose? If you miss a dose, take your next scheduled dose at its regular time. Do not take double or extra doses. Talk to your doctor if you are not sure what to do. What may interact with this medicine? Do not take  this medicine with any of the following medications: -certain medicines for fungal infections like fluconazole, itraconazole, ketoconazole, posaconazole, voriconazole -certain medicines for stomach problems like cimetidine, famotidine, ranitidine, omeprazole -cisapride -dofetilide -dronedarone -ergot alkaloids like dihydroergotamine, ergonovine, ergotamine, methylergonovine -pimozide -St. John's Wort -thioridazine -ziprasidone This medicine may also interact with the following medications: -acetaminophen -alfentanil -antiviral medicines for HIV or AIDS -aspirin -carbamazepine -certain antibiotics like clarithromycin, erythromycin, rifampin, rifabutin, rifapentine, telithromycin, troleandomycin -certain medicines for cholesterol like atorvastatin -certain medicines that treat or prevent blood clots like warfarin -cyclosporine -dexamethasone -fentanyl -NSAIDS, medicines for pain and inflammation, like ibuprofen, ketoprofen, naproxen -other medicines that prolong the QT interval (cause an abnormal heart rhythm) -phenobarbital -phenytoin -sirolimus -tacrolimus This list may not describe all possible interactions. Give your health care provider a list of all the medicines, herbs, non-prescription drugs, or dietary supplements you use. Also tell them if you smoke, drink alcohol, or use illegal drugs. Some items may interact with your medicine. What should I watch for while using this medicine? Visit your doctor for regular check ups. Report any side effects. Continue your course of treatment unless your doctor tells you to stop. You will need blood work done while you are taking this medicine. You may need blood work done while you are taking this medicine. Do not become pregnant while taking this medicine or for 30 days after stopping it. Women should inform their doctor if they wish to become pregnant or think they might be pregnant. There is a potential for serious side effects to an  unborn child. Talk  to your health care professional or pharmacist for more information. Do not breast-feed an infant while taking this medicine and for 2 weeks after the last dose. What side effects may I notice from receiving this medicine? Side effects that you should report to your doctor or health care professional as soon as possible: -allergic reactions like skin rash, itching or hives, swelling of the face, lips, or tongue -low blood counts - this medicine may decrease the number of white blood cells, red blood cells, and platelets. You may be at increased risk for infections and bleeding. -signs of infection - fever or chills, cough, sore throat, pain or difficulty passing urine -signs of decreased platelets or bleeding - bruising, pinpoint red spots on the skin, black, tarry stools, blood in the urine, nosebleeds -signs of decreased red blood cells - unusual weakness or tiredness, fainting spells, lightheadedness -breathing problems -dry cough -fast, irregular heartbeat -redness, blistering, peeling or loosening of the skin, including inside the mouth -swelling of the legs or ankles, or other parts of the body -sores or white patches in your mouth or throat -sudden weight gain Side effects that usually do not require medical attention (report to your prescriber or health care professional if they continue or are bothersome): -diarrhea -headache -nausea, vomiting -weak or tired This list may not describe all possible side effects. Call your doctor for medical advice about side effects. You may report side effects to FDA at 1-800-FDA-1088. Where should I keep my medicine? Keep out of the reach of children. Store at room temperature between 20 and 25 degrees C (68 and 77 degrees F). Throw away any unused medicine after the expiration date. NOTE: This sheet is a summary. It may not cover all possible information. If you have questions about this medicine, talk to your doctor,  pharmacist, or health care provider.    2016, Elsevier/Gold Standard. (2014-10-01 21:58:39) Hydroxyurea capsules What is this medicine? HYDROXYUREA (hye drox ee yoor EE a) is a chemotherapy drug. This medicine is used to treat certain types of leukemias and head and neck cancer. It is also used to control the painful crises of sickle cell anemia. This medicine may be used for other purposes; ask your health care provider or pharmacist if you have questions. What should I tell my health care provider before I take this medicine? They need to know if you have any of these conditions: -HIV or AIDS -kidney disease or on hemodialysis -liver disease -low blood counts, like low white cell, platelet, or red cell counts -prior or current interferon therapy -recent or ongoing radiation therapy -an unusual or allergic reaction to hydroxyurea, other chemotherapy, other medicines, foods, dyes, or preservatives -pregnant or trying to get pregnant -breast-feeding How should I use this medicine? Take this medicine by mouth with a glass of water. Follow the directions on the prescription label. Take your medicine at regular intervals. Do not take it more often than directed. Do not stop taking except on your doctor's advice. People who are not taking this medicine should not be exposed to it. Wash your hands before and after handling your bottle or medicine. Caregivers should wear disposable gloves if they must touch the bottle or medicine. Clean up any medicine powder that spills with a damp disposable towel and throw the towel away in a closed container, such as a plastic bag. Talk to your pediatrician regarding the use of this medicine in children. Special care may be needed. Overdosage: If you think you have taken too  much of this medicine contact a poison control center or emergency room at once. NOTE: This medicine is only for you. Do not share this medicine with others. What if I miss a dose? If you  miss a dose, take it as soon as you can. If it is almost time for your next dose, take only that dose. Do not take double or extra doses. What may interact with this medicine? This medicine may also interact with the following medications: -didanosine -stavudine -live virus vaccines This list may not describe all possible interactions. Give your health care provider a list of all the medicines, herbs, non-prescription drugs, or dietary supplements you use. Also tell them if you smoke, drink alcohol, or use illegal drugs. Some items may interact with your medicine. What should I watch for while using this medicine? This drug may make you feel generally unwell. This is not uncommon, as chemotherapy can affect healthy cells as well as cancer cells. Report any side effects. Continue your course of treatment even though you feel ill unless your doctor tells you to stop. You will receive regular blood tests during your treatment. Call your doctor or health care professional for advice if you get a fever, chills or sore throat, or other symptoms of a cold or flu. Do not treat yourself. This drug decreases your body's ability to fight infections. Try to avoid being around people who are sick. This medicine may increase your risk to bruise or bleed. Call your doctor or health care professional if you notice any unusual bleeding. Talk to your doctor about your risk of cancer. You may be more at risk for certain types of cancers if you take this medicine. You may need blood work done while you are taking this medicine. Do not become pregnant while taking this medicine or for at least 6 months after stopping it. Women should inform their doctor if they wish to become pregnant or think they might be pregnant. Men should not father a child while taking this medicine and for at least a year after stopping it. There is a potential for serious side effects to an unborn child. Talk to your health care professional or  pharmacist for more information. Do not breast-feed an infant while taking this medicine. This may interfere with the ability to have or father a child. You should talk with your doctor or health care professional if you are concerned about your fertility. What side effects may I notice from receiving this medicine? Side effects that you should report to your doctor or health care professional as soon as possible: -allergic reactions like skin rash, itching or hives, swelling of the face, lips, or tongue -low blood counts - this medicine may decrease the number of white blood cells, red blood cells and platelets. You may be at increased risk for infections and bleeding. -signs of infection - fever or chills, cough, sore throat, pain or difficulty passing urine -signs of decreased platelets or bleeding - bruising, pinpoint red spots on the skin, black, tarry stools, blood in the urine -signs of decreased red blood cells - unusually weak or tired, fainting spells, lightheadedness -breathing problems -burning, redness or pain at the site of any radiation therapy -changes in skin color -confusion -mouth sores -pain, tingling, numbness in the hands or feet -seizures -skin ulcers -trouble passing urine or change in the amount of urine -vomiting Side effects that usually do not require medical attention (report to your doctor or health care professional if they  continue or are bothersome): -headache -loss of appetite -red color to the face This list may not describe all possible side effects. Call your doctor for medical advice about side effects. You may report side effects to FDA at 1-800-FDA-1088. Where should I keep my medicine? Keep out of the reach of children. See product for storage instructions. Each product may have different instructions. Keep tightly closed. Throw away any unused medicine after the expiration date. NOTE: This sheet is a summary. It may not cover all possible  information. If you have questions about this medicine, talk to your doctor, pharmacist, or health care provider.    2016, Elsevier/Gold Standard. (2014-11-07 16:51:41)

## 2015-05-15 NOTE — Assessment & Plan Note (Addendum)
She has significant leukocytosis with leukoerythroblastic picture. 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 We discussed the importance of bone marrow aspirate and biopsy and she agreed to proceed. In the meantime, I will start her on hydroxyurea 1500 mg daily pending further test results. Due to high LDH and uric acid, I will start her on allopurinol to reduce the risk of tumor lysis syndrome. 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 I will see her next week to review results of bone marrow biopsy. The patient would be at risk of blood clots. I will start her on 81 mg aspirin pending further test results.

## 2015-05-15 NOTE — Progress Notes (Unsigned)
Pt tolerated bone marrow biopsy procedure well. Vital signs stable, no complaints of pain or dizziness. Discharged to home.

## 2015-05-15 NOTE — Progress Notes (Signed)
[facility] NOTE  Patient Care Team: Burnis Medin, MD as PCP - General [name], MD (Endocrinology) [name], MD (Internal Medicine) Everett Graff, MD as Attending Physician (Obstetrics and Gynecology)  CHIEF COMPLAINTS/PURPOSE OF CONSULTATION:  Leukocytosis with leukoerythroblastic picture  HISTORY OF PRESENTING ILLNESS:  Christina [name] 54 y.o. female is here because of elevated WBC.  She was found to have abnormal CBC from routine blood draw. The patient complained of excessive fatigue over the past few weeks. She thought that it could be related to her undertreated thyroid and went to primary care doctor for blood draw. Her baseline blood work in early January 2016 show very minor leukocytosis with relative neutrophilia and basophilia. Her blood draw yesterday was significantly elevated with peripheral blasts seen and was referred here urgently for evaluation. She denies recent infection. The last prescription antibiotics was more than 3 months ago There is not reported symptoms of sinus congestion, cough, urinary frequency/urgency or dysuria, diarrhea, joint swelling/pain or abnormal skin rash.  She had no prior history or diagnosis of cancer. Her age appropriate screening programs are up-to-date. The patient has no prior diagnosis of autoimmune disease and was not prescribed corticosteroids related products.  MEDICAL HISTORY:  Past Medical History  Diagnosis Date  . Allergic rhinitis   . Hyperthyroidism     s/p rai 8/06 now hypothyroid Dr. Suzette Battiest  . Grave's disease   . Hypothyroid   . H/O menorrhagia 2012  . Vitamin D deficiency   . Fibroid   . H/O insomnia   . Infertility, female   . H/O Graves' disease   . History of chicken pox   . Menorrhagia     Had Novasure  . Polyp of cervix     Endometriel polyp. JO  . Fibroids   . Vitamin D deficiency   . Hypertension     SURGICAL HISTORY: Past Surgical History  Procedure Laterality  Date  . Mouth surgery    . Bunionectomy  1986  . Refractive surgery      for vision  . Novasure ablation  09/28/2010  . Hysteroscopy w/d&c  09/28/2010  . Dilation and curettage of uterus  09/15/2010  . Tonsillectomy  1965    SOCIAL HISTORY: Social History   Social History  . Marital Status: Married    Spouse Name: N/A  . Number of Children: N/A  . Years of Education: N/A   Occupational History  . Not on file.   Social History Main Topics  . Smoking status: Never Smoker   . Smokeless tobacco: Never Used  . Alcohol Use: Yes     Comment: socially  . Drug Use: No  . Sexual Activity: Yes   Other Topics Concern  . 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 Diabetes Mother   . Hypertension    . Prostate cancer      grandfather  . Hypertension Father   . Cancer Maternal Uncle     leukemia    ALLERGIES:  has No Known Allergies.  MEDICATIONS:  Current Outpatient Prescriptions  Medication Sig Dispense Refill  . allopurinol (ZYLOPRIM) 300 MG tablet Take 1 tablet (300 mg total) by mouth daily. 30 tablet 0  . hydroxyurea (HYDREA) 500 MG capsule Take 3 capsules (1,500 mg total) by mouth  daily. May take with food to minimize GI side effects. 45 capsule 0  . ibuprofen (ADVIL,MOTRIN) 200 MG tablet Take 200 mg by mouth every 6 (six) hours as needed.    Marland Kitchen lisinopril (PRINIVIL,ZESTRIL) 20 MG tablet Take 1 tablet (20 mg total) by mouth daily. 90 tablet 3  . SYNTHROID 150 MCG tablet Take 1 tablet (150 mcg total) by mouth daily before breakfast. 90 tablet 1  . verapamil (CALAN-SR) 240 MG CR tablet Take 1 tablet (240 mg total) by mouth at bedtime. 90 tablet 1  . zolpidem (AMBIEN) 10 MG tablet Take 1/2 to 1 po qd at night as needed. 90 tablet 1   No current facility-administered medications for this visit.    REVIEW OF SYSTEMS:   Constitutional:  Denies fevers, chills or abnormal night sweats Eyes: Denies blurriness of vision, double vision or watery eyes Ears, nose, mouth, throat, and face: Denies mucositis or sore throat Respiratory: Denies cough, dyspnea or wheezes Cardiovascular: Denies palpitation, chest discomfort or lower extremity swelling Gastrointestinal:  Denies nausea, heartburn or change in bowel habits Skin: Denies abnormal skin rashes Lymphatics: Denies new lymphadenopathy or easy bruising Neurological:Denies numbness, tingling or new weaknesses Behavioral/Psych: Mood is stable, no new changes  All other systems were reviewed with the patient and are negative.  PHYSICAL EXAMINATION: ECOG PERFORMANCE STATUS: 1 - Symptomatic but completely ambulatory  Filed Vitals:   05/15/15 0809  BP: 123/75  Pulse: 75  Temp: 97.7 F (36.5 C)  Resp: 20   Filed Weights   05/15/15 0809  Weight: 173 lb (78.472 kg)    GENERAL:alert, no distress and comfortable SKIN: skin color, texture, turgor are normal, no rashes or significant lesions EYES: normal, conjunctiva are pink and non-injected, sclera clear OROPHARYNX:no exudate, no erythema and lips, buccal mucosa, and tongue normal  NECK: supple, thyroid normal size, non-tender, without nodularity LYMPH:  no palpable lymphadenopathy in the cervical, axillary or inguinal LUNGS: clear to auscultation and percussion with normal breathing effort HEART: regular rate & rhythm and no murmurs and no lower extremity edema ABDOMEN:abdomen soft, non-tender and normal bowel sounds. She has no palpable splenomegaly Musculoskeletal:no cyanosis of digits and no clubbing  PSYCH: alert & oriented x 3 with fluent speech NEURO: no focal motor/sensory deficits  LABORATORY DATA:  I have reviewed the data as listed Recent Results (from the past 2160 hour(s))  Basic metabolic panel     Status: None   Collection Time: 05/13/15 12:42 PM  Result Value Ref Range   Sodium 137 135 - 145 mEq/L    Potassium 4.2 3.5 - 5.1 mEq/L   Chloride 103 96 - 112 mEq/L   CO2 26 19 - 32 mEq/L   Glucose, Bld 80 70 - 99 mg/dL   BUN 15 6 - 23 mg/dL   Creatinine, Ser 0.71 0.40 - 1.20 mg/dL   Calcium 9.7 8.4 - 10.5 mg/dL   GFR 90.89 >60.00 mL/min  CBC with Differential/Platelet     Status: Abnormal   Collection Time: 05/13/15 12:42 PM  Result Value Ref Range   WBC 88.8 Repeated and verified X2. (HH) 4.0 - 10.5 K/uL   RBC 4.09 3.87 - 5.11 Mil/uL   Hemoglobin 13.0 12.0 - 15.0 g/dL   HCT 39.0 36.0 - 46.0 %   MCV 95.4 78.0 - 100.0 fl   MCHC 33.3 30.0 - 36.0 g/dL   RDW 17.0 (H) 11.5 - 15.5 %   Platelets 313.0 150.0 - 400.0 K/uL   Neutrophils Relative % (H) 43.0 -  77.0 %    88.3 abnormal cells  noted on smear referred for pathology review.   Lymphocytes Relative 6.7 (L) 12.0 - 46.0 %   Monocytes Relative 3.1 3.0 - 12.0 %   Eosinophils Relative 1.9 0.0 - 5.0 %   Basophils Relative 0.0 0.0 - 3.0 %   Neutro Abs 78.4 (H) 1.4 - 7.7 K/uL   Lymphs Abs 6.0 (H) 0.7 - 4.0 K/uL   Monocytes Absolute 2.8 (H) 0.1 - 1.0 K/uL   Eosinophils Absolute 1.7 (H) 0.0 - 0.7 K/uL   Basophils Absolute 0.0 0.0 - 0.1 K/uL  TSH     Status: None   Collection Time: 05/13/15 12:42 PM  Result Value Ref Range   TSH 2.49 0.35 - 4.50 uIU/mL  Hepatic function panel     Status: None   Collection Time: 05/13/15 12:42 PM  Result Value Ref Range   Total Bilirubin 0.3 0.2 - 1.2 mg/dL   Bilirubin, Direct 0.1 0.0 - 0.3 mg/dL   Alkaline Phosphatase 52 39 - 117 U/L   AST 20 0 - 37 U/L   ALT 20 0 - 35 U/L   Total Protein 7.2 6.0 - 8.3 g/dL   Albumin 4.1 3.5 - 5.2 g/dL  Lipid panel     Status: Abnormal   Collection Time: 05/13/15 12:42 PM  Result Value Ref Range   Cholesterol 200 0 - 200 mg/dL    Comment: ATP III Classification       Desirable:  < 200 mg/dL               Borderline High:  200 - 239 mg/dL          High:  > = 240 mg/dL   Triglycerides 366.0 (H) 0.0 - 149.0 mg/dL    Comment: Normal:  <150 mg/dLBorderline High:  150 -  199 mg/dL   HDL 43.60 >39.00 mg/dL   VLDL 73.2 (H) 0.0 - 40.0 mg/dL   Total CHOL/HDL Ratio 5     Comment:                Men          Women1/2 Average Risk     3.4          3.3Average Risk          5.0          4.42X Average Risk          9.6          7.13X Average Risk          15.0          11.0                       NonHDL 156.09     Comment: NOTE:  Non-HDL goal should be 30 mg/dL higher than patient's LDL goal (i.e. LDL goal of < 70 mg/dL, would have non-HDL goal of < 100 mg/dL)  LDL cholesterol, direct     Status: None   Collection Time: 05/13/15 12:42 PM  Result Value Ref Range   Direct LDL 119.0 mg/dL    Comment: Optimal:  <100 mg/dLNear or Above Optimal:  100-129 mg/dLBorderline High:  130-159 mg/dLHigh:  160-189 mg/dLVery High:  >190 mg/dL  Pathologist smear review     Status: None   Collection Time: 05/13/15  5:02 PM  Result Value Ref Range   Path Review SEE NOTE     Comment:   Elevated  white count with immature granulocytic cells including blasts, promyelocytes, and myelocytes.  NRBCs and abnormal RBC morphology also noted.  Recommend full hematologic evaluation, if clinically indicated.  Reviewed by Francis Gaines Mammarappallil MD (Electronic Signature on File) 05/14/15   CBC with Differential/Platelet     Status: Abnormal   Collection Time: 05/15/15  7:43 AM  Result Value Ref Range   WBC 86.7 (HH) 3.9 - 10.3 10e3/uL   HGB 12.4 11.6 - 15.9 g/dL   HCT 38.6 34.8 - 46.6 %   Platelets 310 145 - 400 10e3/uL   MCV 94.2 79.5 - 101.0 fL   MCH 30.3 25.1 - 34.0 pg   MCHC 32.1 31.5 - 36.0 g/dL   RBC 4.10 3.70 - 5.45 10e6/uL   RDW 16.6 (H) 11.2 - 14.5 %  Lactate dehydrogenase     Status: Abnormal   Collection Time: 05/15/15  7:43 AM  Result Value Ref Range   LDH 606 (H) 125 - 245 U/L  Manual Differential [facility]     Status: Abnormal   Collection Time: 05/15/15  7:43 AM  Result Value Ref Range   ANC ([facility] manual diff) 78.9 (H) 1.5 - 6.5 10e3/uL   ALC 2.6 0.9 - 3.3 10e3/uL   SEG  39 38 - 77 %   Band Neutrophils 33 (H) 0 - 10 %   LYMPH 3 (L) 14 - 49 %   MONO 2 0 - 14 %   EOS 1 0 - 7 %   Basophil 1 0 - 2 %   Metamyelocytes 11 (H) 0 - 0 %   Myelocytes 8 (H) 0 - 0 %   PROMYELO 1 (H) 0 - 0 %   Blasts 1 (H) 0 - 0 %   Variant Lymph 0 0 - 0 %   Other Cell 0 0 - 0 %   nRBC 1 (H) 0 - 0 %   Polychromasia Slight Slight   Tear Drop Cells Few Negative   Ovalocytes Moderate Negative   PLT EST Adequate Adequate  Comprehensive metabolic panel     Status: Abnormal   Collection Time: 05/15/15  7:43 AM  Result Value Ref Range   Sodium 137 136 - 145 mEq/L   Potassium 4.2 3.5 - 5.1 mEq/L   Chloride 106 98 - 109 mEq/L   CO2 21 (L) 22 - 29 mEq/L   Glucose 94 70 - 140 mg/dl    Comment: Glucose reference range is for nonfasting patients. Fasting glucose reference range is 70- 100.   BUN 14.4 7.0 - 26.0 mg/dL   Creatinine 0.8 0.6 - 1.1 mg/dL   Total Bilirubin 0.35 0.20 - 1.20 mg/dL   Alkaline Phosphatase 57 40 - 150 U/L   AST 19 5 - 34 U/L   ALT 18 0 - 55 U/L   Total Protein 6.9 6.4 - 8.3 g/dL   Albumin 3.9 3.5 - 5.0 g/dL   Calcium 9.3 8.4 - 10.4 mg/dL   Anion Gap 10 3 - 11 mEq/L   EGFR 86 (L) >90 ml/min/1.73 m2    Comment: eGFR is calculated using the CKD-EPI Creatinine Equation (2009)  Uric Acid     Status: Abnormal   Collection Time: 05/15/15  7:43 AM  Result Value Ref Range   Uric Acid, Serum 9.2 (H) 2.6 - 7.4 mg/dl  Bone marrow exam     Status: None   Collection Time: 05/15/15 10:05 AM  Result Value Ref Range   Bone Marrow Exam SEE PATHOLOGY REPORT [id number]  Bee Leukocytosis She has significant leukocytosis with leukoerythroblastic picture. 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 We discussed the importance of bone marrow aspirate and biopsy and she agreed to proceed. In the meantime, I will start her on hydroxyurea 1500 mg daily pending further test results. Due to high LDH and uric acid, I will start  her on allopurinol to reduce the risk of tumor lysis syndrome. 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 I will see her next week to review results of bone marrow biopsy. The patient would be at risk of blood clots. I will start her on 81 mg aspirin pending further test results.  Hyperuricemia She has asymptomatic hyperuricemia. I will start her on allopurinol and encourage her to increase oral fluid intake   Bone Marrow Biopsy and Aspiration Procedure Note   Informed consent was obtained and potential risks including bleeding, infection and pain were reviewed with the patient.  The patient's name, date of birth, identification, consent and allergies were verified prior to the start of procedure and time out was performed.  The left posterior iliac crest was chosen as the site of biopsy.  The skin was prepped with Betadine solution.   8 cc of 1% lidocaine was used to provide local anaesthesia.   10 cc of bone marrow aspirate was obtained followed by 1 inch biopsy.   The procedure was tolerated well and there were no complications.  The patient was stable at the end of the procedure.  Specimens sent for flow cytometry, cytogenetics and additional studies.

## 2015-05-15 NOTE — Patient Instructions (Signed)
[facility] Discharge Instructions for Post Bone Marrow Procedure  Today you had a bone marrow biopsy and aspirate of hip  Please keep the pressure dressing in place for at least 24 hours.  Have someone check your dressing periodically for bleeding.  If needed you can reapply a pressure dressing to the site.  Take pain medication as directed.  IF BLEEDING REOCCURS THAT SHOULD BE REPORTED IMMEDIATELY. Call the [facility] at [phone] if during business hours. Or report to the Emergency Room.   I have been informed and understand all the instructions given to me. I know to contact the clinic, my physician, or go to the Emergency Department if any problems should occur. I do not have any questions at this time, but understand that I may call the clinic during office hours at [phone] should I have any questions or need assistance in obtaining follow up care.    __________________________________________  _____________  __________ Signature of Patient or Authorized Representative            Date                   Time    __________________________________________ Nurse's Signature

## 2015-05-18 ENCOUNTER — Other Ambulatory Visit (HOSPITAL_BASED_OUTPATIENT_CLINIC_OR_DEPARTMENT_OTHER): Payer: Managed Care, Other (non HMO)

## 2015-05-18 ENCOUNTER — Encounter: Payer: Self-pay | Admitting: Pharmacist

## 2015-05-18 ENCOUNTER — Ambulatory Visit (HOSPITAL_BASED_OUTPATIENT_CLINIC_OR_DEPARTMENT_OTHER): Payer: Managed Care, Other (non HMO) | Admitting: Hematology and Oncology

## 2015-05-18 ENCOUNTER — Telehealth: Payer: Self-pay | Admitting: Hematology and Oncology

## 2015-05-18 ENCOUNTER — Telehealth: Payer: Self-pay | Admitting: *Deleted

## 2015-05-18 ENCOUNTER — Encounter: Payer: Self-pay | Admitting: Hematology and Oncology

## 2015-05-18 VITALS — BP 126/69 | HR 77 | Temp 97.5°F | Resp 20 | Ht 67.0 in | Wt 171.8 lb

## 2015-05-18 DIAGNOSIS — D471 Chronic myeloproliferative disease: Secondary | ICD-10-CM

## 2015-05-18 DIAGNOSIS — Z5111 Encounter for antineoplastic chemotherapy: Secondary | ICD-10-CM | POA: Insufficient documentation

## 2015-05-18 DIAGNOSIS — E790 Hyperuricemia without signs of inflammatory arthritis and tophaceous disease: Secondary | ICD-10-CM

## 2015-05-18 DIAGNOSIS — D72829 Elevated white blood cell count, unspecified: Secondary | ICD-10-CM

## 2015-05-18 DIAGNOSIS — C9210 Chronic myeloid leukemia, BCR/ABL-positive, not having achieved remission: Secondary | ICD-10-CM | POA: Insufficient documentation

## 2015-05-18 HISTORY — DX: Chronic myeloproliferative disease: D47.1

## 2015-05-18 LAB — CBC WITH DIFFERENTIAL/PLATELET
BASO%: 0.8 % (ref 0.0–2.0)
Basophils Absolute: 0.5 10*3/uL — ABNORMAL HIGH (ref 0.0–0.1)
EOS%: 1.6 % (ref 0.0–7.0)
Eosinophils Absolute: 1.2 10*3/uL — ABNORMAL HIGH (ref 0.0–0.5)
HCT: 38.1 % (ref 34.8–46.6)
HEMOGLOBIN: 12.6 g/dL (ref 11.6–15.9)
LYMPH%: 6 % — ABNORMAL LOW (ref 14.0–49.7)
MCH: 31.2 pg (ref 25.1–34.0)
MCHC: 33.2 g/dL (ref 31.5–36.0)
MCV: 94 fL (ref 79.5–101.0)
MONO#: 2.7 10*3/uL — ABNORMAL HIGH (ref 0.1–0.9)
MONO%: 3.7 % (ref 0.0–14.0)
NEUT%: 87.9 % — ABNORMAL HIGH (ref 38.4–76.8)
NEUTROS ABS: 63.1 10*3/uL — AB (ref 1.5–6.5)
Platelets: 332 10*3/uL (ref 145–400)
RBC: 4.05 10*6/uL (ref 3.70–5.45)
RDW: 16.3 % — AB (ref 11.2–14.5)
WBC: 71.8 10*3/uL — AB (ref 3.9–10.3)
lymph#: 4.3 10*3/uL — ABNORMAL HIGH (ref 0.9–3.3)

## 2015-05-18 LAB — COMPREHENSIVE METABOLIC PANEL (CC13)
ALT: 21 U/L (ref 0–55)
ANION GAP: 9 meq/L (ref 3–11)
AST: 19 U/L (ref 5–34)
Albumin: 3.9 g/dL (ref 3.5–5.0)
Alkaline Phosphatase: 58 U/L (ref 40–150)
BUN: 16.1 mg/dL (ref 7.0–26.0)
CHLORIDE: 105 meq/L (ref 98–109)
CO2: 22 meq/L (ref 22–29)
CREATININE: 0.8 mg/dL (ref 0.6–1.1)
Calcium: 9.4 mg/dL (ref 8.4–10.4)
EGFR: 82 mL/min/{1.73_m2} — ABNORMAL LOW (ref >90)
Glucose: 100 mg/dl (ref 70–140)
POTASSIUM: 3.9 meq/L (ref 3.5–5.1)
Sodium: 137 mEq/L (ref 136–145)
Total Bilirubin: <0.3 mg/dL (ref 0.20–1.20)
Total Protein: 7.3 g/dL (ref 6.4–8.3)

## 2015-05-18 LAB — URIC ACID (CC13): URIC ACID, SERUM: 6.5 mg/dL (ref 2.6–7.4)

## 2015-05-18 LAB — TECHNOLOGIST REVIEW: Technologist Review: 8

## 2015-05-18 LAB — LACTATE DEHYDROGENASE (CC13): LDH: 634 U/L — AB (ref 125–245)

## 2015-05-18 MED ORDER — DASATINIB 100 MG PO TABS: 100.0000 mg | ORAL_TABLET | Freq: Every day | ORAL | Status: DC

## 2015-05-18 NOTE — Assessment & Plan Note (Addendum)
I reviewed the bone marrow biopsy with the pathologist. 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 BCR/ABL is still pending. She has excellent response to hydroxyurea with no side effects. We will continue the same. I discussed with her and her husband the risks, benefits, side effects of Sprycel and she agreed to proceed. We will obtain insurance preauthorization and I plan to transition her to Sprycel once we have confirmation diagnosis, hopefully within the next 72 hours. I will start her on 100 mg of Sprycel daily. She will continue weekly blood draw and I see her next month for further review of blood tests results and toxicity. She will continue 81 mg aspirin to prevent risks of blood clot.

## 2015-05-18 NOTE — Telephone Encounter (Signed)
Gave patient avs report and appointments for October and November. Appointments scheduler on patient's day off (wed).

## 2015-05-18 NOTE — Progress Notes (Signed)
Oral Chemotherapy Pharmacist Encounter  I spoke with patient and her husband for overview of new oral chemotherapy medication for CML: Sprycel (Dasatinib). Pt is doing well. The prescription has been sent to the [facility] for benefit analysis and approval. I will follow up with patient regarding approval process once diagnosis is confirmed.    Counseled patient on administration, dosing, side effects, safe handling, and monitoring. Side effects include but not limited to: skin rash, diarrhea, bleeding, infection, fatigue, fluid rentention, nausea and vomiting.  Ms. Canty voiced understanding and appreciation.   All questions answered.  Will follow up with patient regarding insurance and pharmacy.   Will follow up in 1-2 weeks for adherence and toxicity management.   Thank you,  Montel Clock, PharmD, [facility]

## 2015-05-18 NOTE — Assessment & Plan Note (Signed)
Uric acid level is now normal. She is responding well to hydroxyurea. We will continue the same.

## 2015-05-18 NOTE — Telephone Encounter (Signed)
[facility] faxed Prior authorization request for Sprycell.  Request to [employer] for review.

## 2015-05-18 NOTE — Progress Notes (Signed)
[facility] OFFICE PROGRESS NOTE  Patient Care Team: Burnis Medin, MD as PCP - General [name], MD (Endocrinology) [name], MD (Internal Medicine) Everett Graff, MD as Attending Physician (Obstetrics and Gynecology)  SUMMARY OF ONCOLOGIC HISTORY:  She was found to have abnormal CBC from routine blood draw. The patient complained of excessive fatigue over the past few weeks. She thought that it could be related to her undertreated thyroid and went to primary care doctor for blood draw. Her baseline blood work in early January 2016 show very minor leukocytosis with relative neutrophilia and basophilia. Her blood drawn on 05/14/2015 show significant leukocytosis with leukoerythroblastic picture. Preliminary bone marrow biopsy on 05/15/2015 is suggestive of myeloproliferative disorder, suspicious for CML. She was started on hydroxyurea 1500 mg daily along with allopurinol and 81 mg aspirin.   INTERVAL HISTORY: Please see below for problem oriented charting. She feels well. Denies side effects of treatment.  REVIEW OF SYSTEMS:   Constitutional: Denies fevers, chills or abnormal weight loss Eyes: Denies blurriness of vision Ears, nose, mouth, throat, and face: Denies mucositis or sore throat Respiratory: Denies cough, dyspnea or wheezes Cardiovascular: Denies palpitation, chest discomfort or lower extremity swelling Gastrointestinal:  Denies nausea, heartburn or change in bowel habits Skin: Denies abnormal skin rashes Lymphatics: Denies new lymphadenopathy or easy bruising Neurological:Denies numbness, tingling or new weaknesses Behavioral/Psych: Mood is stable, no new changes  All other systems were reviewed with the patient and are negative.  I have reviewed the past medical history, past surgical history, social history and family history with the patient and they are unchanged from previous note.  ALLERGIES:  has No Known  Allergies.  MEDICATIONS:  Current Outpatient Prescriptions  Medication Sig Dispense Refill  . allopurinol (ZYLOPRIM) 300 MG tablet Take 1 tablet (300 mg total) by mouth daily. 30 tablet 0  . aspirin 81 MG tablet Take 81 mg by mouth daily.    . hydroxyurea (HYDREA) 500 MG capsule Take 3 capsules (1,500 mg total) by mouth daily. May take with food to minimize GI side effects. 45 capsule 0  . ibuprofen (ADVIL,MOTRIN) 200 MG tablet Take 200 mg by mouth every 6 (six) hours as needed.    Marland Kitchen lisinopril (PRINIVIL,ZESTRIL) 20 MG tablet Take 1 tablet (20 mg total) by mouth daily. 90 tablet 3  . SYNTHROID 150 MCG tablet Take 1 tablet (150 mcg total) by mouth daily before breakfast. 90 tablet 1  . verapamil (CALAN-SR) 240 MG CR tablet Take 1 tablet (240 mg total) by mouth at bedtime. 90 tablet 1  . zolpidem (AMBIEN) 10 MG tablet Take 1/2 to 1 po qd at night as needed. 90 tablet 1  . dasatinib (SPRYCEL) 100 MG tablet Take 1 tablet (100 mg total) by mouth daily. 90 tablet 3   No current facility-administered medications for this visit.    PHYSICAL EXAMINATION: ECOG PERFORMANCE STATUS: 0 - Asymptomatic  Filed Vitals:   05/18/15 1454  BP: 126/69  Pulse: 77  Temp: 97.5 F (36.4 C)  Resp: 20   Filed Weights   05/18/15 1454  Weight: 171 lb 12.8 oz (77.928 kg)    GENERAL:alert, no distress and comfortable SKIN: skin color, texture, turgor are normal, no rashes or significant lesions EYES: normal, Conjunctiva are pink and non-injected, sclera clear Musculoskeletal:no cyanosis of digits and no clubbing  NEURO: alert & oriented x 3 with fluent speech, no focal motor/sensory deficits  LABORATORY DATA:  I have reviewed the data as listed  Component Value Date/Time   NA 137 05/18/2015 1442   NA 137 05/13/2015 1242   K 3.9 05/18/2015 1442   K 4.2 05/13/2015 1242   CL 103 05/13/2015 1242   CO2 22 05/18/2015 1442   CO2 26 05/13/2015 1242   GLUCOSE 100 05/18/2015 1442   GLUCOSE 80 05/13/2015  1242   BUN 16.1 05/18/2015 1442   BUN 15 05/13/2015 1242   CREATININE 0.8 05/18/2015 1442   CREATININE 0.71 05/13/2015 1242   CALCIUM 9.4 05/18/2015 1442   CALCIUM 9.7 05/13/2015 1242   PROT 7.3 05/18/2015 1442   PROT 7.2 05/13/2015 1242   ALBUMIN 3.9 05/18/2015 1442   ALBUMIN 4.1 05/13/2015 1242   AST 19 05/18/2015 1442   AST 20 05/13/2015 1242   ALT 21 05/18/2015 1442   ALT 20 05/13/2015 1242   ALKPHOS 58 05/18/2015 1442   ALKPHOS 52 05/13/2015 1242   BILITOT <0.30 05/18/2015 1442   BILITOT 0.3 05/13/2015 1242   GFRNONAA >90 08/08/2014 1528   GFRAA >90 08/08/2014 1528    No results found for: SPEP, UPEP  Lab Results  Component Value Date   WBC 71.8* 05/18/2015   NEUTROABS 63.1* 05/18/2015   HGB 12.6 05/18/2015   HCT 38.1 05/18/2015   MCV 94.0 05/18/2015   PLT 332 05/18/2015      Chemistry      Component Value Date/Time   NA 137 05/18/2015 1442   NA 137 05/13/2015 1242   K 3.9 05/18/2015 1442   K 4.2 05/13/2015 1242   CL 103 05/13/2015 1242   CO2 22 05/18/2015 1442   CO2 26 05/13/2015 1242   BUN 16.1 05/18/2015 1442   BUN 15 05/13/2015 1242   CREATININE 0.8 05/18/2015 1442   CREATININE 0.71 05/13/2015 1242      Component Value Date/Time   CALCIUM 9.4 05/18/2015 1442   CALCIUM 9.7 05/13/2015 1242   ALKPHOS 58 05/18/2015 1442   ALKPHOS 52 05/13/2015 1242   AST 19 05/18/2015 1442   AST 20 05/13/2015 1242   ALT 21 05/18/2015 1442   ALT 20 05/13/2015 1242   BILITOT <0.30 05/18/2015 1442   BILITOT 0.3 05/13/2015 1242       ASSESSMENT & PLAN:  MPN (myeloproliferative neoplasm) ([place]) I reviewed the bone marrow biopsy with the pathologist. 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 BCR/ABL is still pending. She has excellent response to hydroxyurea with no side effects. We will continue the same. I discussed with her and her husband the risks, benefits, side effects of Sprycel and she agreed to proceed. We will obtain insurance preauthorization and I plan to  transition her to Sprycel once we have confirmation diagnosis, hopefully within the next 72 hours. I will start her on 100 mg of Sprycel daily. She will continue weekly blood draw and I see her next month for further review of blood tests results and toxicity. She will continue 81 mg aspirin to prevent risks of blood clot.  Hyperuricemia Uric acid level is now normal. She is responding well to hydroxyurea. We will continue the same.   Orders Placed This Encounter  Procedures  . CBC with Differential/Platelet    Standing Status: Standing     Number of Occurrences: 9     Standing Expiration Date: 05/17/2016  . Comprehensive metabolic panel    Standing Status: Future     Number of Occurrences:      Standing Expiration Date: 06/21/2016  . Uric Acid    Standing Status: Future  Number of Occurrences:      Standing Expiration Date: 06/21/2016   All questions were answered. The patient knows to call the clinic with any problems, questions or concerns. No barriers to learning was detected. I spent 20 minutes counseling the patient face to face. The total time spent in the appointment was 30 minutes and more than 50% was on counseling and review of test results     [facility], [place], MD 05/18/2015 3:42 PM

## 2015-05-19 LAB — FLOW CYTOMETRY

## 2015-05-20 ENCOUNTER — Encounter: Payer: Self-pay | Admitting: Pharmacist

## 2015-05-20 ENCOUNTER — Telehealth: Payer: Self-pay | Admitting: Pharmacist

## 2015-05-20 LAB — TISSUE HYBRIDIZATION (BONE MARROW)-NCBH

## 2015-05-20 LAB — CHROMOSOME ANALYSIS, BONE MARROW

## 2015-05-20 NOTE — Telephone Encounter (Signed)
10/12: Prior Auth form faxed in to [place]. [phone]) [phone]. Will follow up for approval/copay.

## 2015-05-20 NOTE — Progress Notes (Signed)
Received fax today for Sprycel approval by [employer] from 05/20/15-05/19/16. Document to medical records.

## 2015-05-21 ENCOUNTER — Encounter: Payer: Self-pay | Admitting: Hematology and Oncology

## 2015-05-21 ENCOUNTER — Telehealth: Payer: Self-pay | Admitting: Hematology and Oncology

## 2015-05-21 NOTE — Telephone Encounter (Signed)
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

## 2015-05-25 ENCOUNTER — Telehealth: Payer: Self-pay | Admitting: Pharmacist

## 2015-05-25 NOTE — Telephone Encounter (Signed)
Called and Left VM for Christina [name] to follow up on new medication Sprycel. Will follow up again later in the week

## 2015-05-28 ENCOUNTER — Other Ambulatory Visit (HOSPITAL_BASED_OUTPATIENT_CLINIC_OR_DEPARTMENT_OTHER): Payer: Managed Care, Other (non HMO)

## 2015-05-28 DIAGNOSIS — D471 Chronic myeloproliferative disease: Secondary | ICD-10-CM | POA: Diagnosis not present

## 2015-05-28 LAB — TECHNOLOGIST REVIEW

## 2015-05-28 LAB — CBC WITH DIFFERENTIAL/PLATELET
BASO%: 0.1 % (ref 0.0–2.0)
Basophils Absolute: 0.1 10*3/uL (ref 0.0–0.1)
EOS%: 1 % (ref 0.0–7.0)
Eosinophils Absolute: 0.5 10*3/uL (ref 0.0–0.5)
HCT: 34.7 % — ABNORMAL LOW (ref 34.8–46.6)
HGB: 11.3 g/dL — ABNORMAL LOW (ref 11.6–15.9)
LYMPH#: 3.8 10*3/uL — AB (ref 0.9–3.3)
LYMPH%: 7.8 % — ABNORMAL LOW (ref 14.0–49.7)
MCH: 31.3 pg (ref 25.1–34.0)
MCHC: 32.7 g/dL (ref 31.5–36.0)
MCV: 95.7 fL (ref 79.5–101.0)
MONO#: 1.2 10*3/uL — AB (ref 0.1–0.9)
MONO%: 2.4 % (ref 0.0–14.0)
NEUT%: 88.7 % — AB (ref 38.4–76.8)
NEUTROS ABS: 42.7 10*3/uL — AB (ref 1.5–6.5)
PLATELETS: 297 10*3/uL (ref 145–400)
RBC: 3.62 10*6/uL — AB (ref 3.70–5.45)
RDW: 16.5 % — AB (ref 11.2–14.5)
WBC: 48.3 10*3/uL — ABNORMAL HIGH (ref 3.9–10.3)

## 2015-05-28 LAB — COMPREHENSIVE METABOLIC PANEL (CC13)
ALT: 24 U/L (ref 0–55)
ANION GAP: 7 meq/L (ref 3–11)
AST: 23 U/L (ref 5–34)
Albumin: 3.7 g/dL (ref 3.5–5.0)
Alkaline Phosphatase: 54 U/L (ref 40–150)
BUN: 16.7 mg/dL (ref 7.0–26.0)
CHLORIDE: 105 meq/L (ref 98–109)
CO2: 25 meq/L (ref 22–29)
Calcium: 9.3 mg/dL (ref 8.4–10.4)
Creatinine: 0.8 mg/dL (ref 0.6–1.1)
EGFR: 82 mL/min/{1.73_m2} — AB (ref >90)
GLUCOSE: 91 mg/dL (ref 70–140)
Potassium: 4 mEq/L (ref 3.5–5.1)
SODIUM: 138 meq/L (ref 136–145)
Total Bilirubin: 0.33 mg/dL (ref 0.20–1.20)
Total Protein: 6.8 g/dL (ref 6.4–8.3)

## 2015-05-28 LAB — URIC ACID (CC13): Uric Acid, Serum: 8 mg/dl — ABNORMAL HIGH (ref 2.6–7.4)

## 2015-05-29 ENCOUNTER — Telehealth: Payer: Self-pay | Admitting: Family Medicine

## 2015-05-29 DIAGNOSIS — H9190 Unspecified hearing loss, unspecified ear: Secondary | ICD-10-CM

## 2015-05-29 NOTE — Telephone Encounter (Signed)
She is interested in getting a cochlear implant. Please set up a referral to see Dr. Vira Blanco at [facility], his appt scheduler is Marlowe Kays at [phone]

## 2015-05-29 NOTE — Telephone Encounter (Signed)
Referral; sent in   She may to get records of her last audiologic evaluation

## 2015-06-01 ENCOUNTER — Telehealth: Payer: Self-pay | Admitting: *Deleted

## 2015-06-01 ENCOUNTER — Other Ambulatory Visit: Payer: Self-pay | Admitting: Hematology and Oncology

## 2015-06-01 DIAGNOSIS — C9210 Chronic myeloid leukemia, BCR/ABL-positive, not having achieved remission: Secondary | ICD-10-CM

## 2015-06-01 NOTE — Telephone Encounter (Signed)
-----   Message from Heath Lark, MD sent at 06/01/2015  7:34 AM EDT ----- Regarding: test results Please let her know CBC is better but uric acid level is high I recommend increasing allopurinol to BID  ----- Message -----    From: [facility]    Sent: 05/28/2015   4:13 PM      To: Heath Lark, MD

## 2015-06-01 NOTE — Telephone Encounter (Signed)
LVM on pt's cell phone informing her of Dr. Calton Dach message below.  Asked her to please call back to confirm she got this message or if any questions.   There was no answer at her home number or her husbands' cell phone.

## 2015-06-03 ENCOUNTER — Ambulatory Visit: Payer: Managed Care, Other (non HMO)

## 2015-06-03 ENCOUNTER — Telehealth: Payer: Self-pay | Admitting: *Deleted

## 2015-06-03 ENCOUNTER — Telehealth: Payer: Self-pay | Admitting: Pharmacist

## 2015-06-03 ENCOUNTER — Other Ambulatory Visit (HOSPITAL_BASED_OUTPATIENT_CLINIC_OR_DEPARTMENT_OTHER): Payer: Managed Care, Other (non HMO)

## 2015-06-03 DIAGNOSIS — C9210 Chronic myeloid leukemia, BCR/ABL-positive, not having achieved remission: Secondary | ICD-10-CM

## 2015-06-03 DIAGNOSIS — D471 Chronic myeloproliferative disease: Secondary | ICD-10-CM

## 2015-06-03 LAB — CBC WITH DIFFERENTIAL/PLATELET
BASO%: 0.4 % (ref 0.0–2.0)
BASOS ABS: 0.1 10*3/uL (ref 0.0–0.1)
EOS%: 1.5 % (ref 0.0–7.0)
Eosinophils Absolute: 0.4 10*3/uL (ref 0.0–0.5)
HCT: 36 % (ref 34.8–46.6)
HEMOGLOBIN: 11.9 g/dL (ref 11.6–15.9)
LYMPH%: 12.2 % — AB (ref 14.0–49.7)
MCH: 31.6 pg (ref 25.1–34.0)
MCHC: 33 g/dL (ref 31.5–36.0)
MCV: 95.7 fL (ref 79.5–101.0)
MONO#: 0.9 10*3/uL (ref 0.1–0.9)
MONO%: 3.4 % (ref 0.0–14.0)
NEUT#: 22.5 10*3/uL — ABNORMAL HIGH (ref 1.5–6.5)
NEUT%: 82.5 % — ABNORMAL HIGH (ref 38.4–76.8)
Platelets: 280 10*3/uL (ref 145–400)
RBC: 3.77 10*6/uL (ref 3.70–5.45)
RDW: 16.7 % — AB (ref 11.2–14.5)
WBC: 27.2 10*3/uL — ABNORMAL HIGH (ref 3.9–10.3)
lymph#: 3.3 10*3/uL (ref 0.9–3.3)

## 2015-06-03 LAB — BASIC METABOLIC PANEL (CC13)
Anion Gap: 8 mEq/L (ref 3–11)
BUN: 17.8 mg/dL (ref 7.0–26.0)
CALCIUM: 9.7 mg/dL (ref 8.4–10.4)
CO2: 24 mEq/L (ref 22–29)
CREATININE: 0.8 mg/dL (ref 0.6–1.1)
Chloride: 105 mEq/L (ref 98–109)
EGFR: 78 mL/min/{1.73_m2} — AB (ref >90)
Glucose: 97 mg/dl (ref 70–140)
Potassium: 4.4 mEq/L (ref 3.5–5.1)
SODIUM: 138 meq/L (ref 136–145)

## 2015-06-03 LAB — TECHNOLOGIST REVIEW

## 2015-06-03 LAB — URIC ACID (CC13): URIC ACID, SERUM: 8 mg/dL — AB (ref 2.6–7.4)

## 2015-06-03 NOTE — Telephone Encounter (Signed)
LVM for pt on home and cell phones w/ message from Dr. Alvy Bimler.  Asked her to call back if any questions or concerns.

## 2015-06-03 NOTE — Telephone Encounter (Signed)
Oral Chemotherapy Follow-Up Form  Original Start date of oral chemotherapy: _10/14/16__   Called patient today to follow up regarding patient's oral chemotherapy medication: _Sprycel___  Pt is doing well today. No complaints other then some minor fatigue and gum soreness. Unsure if gum soreness is unrelated or not. Pt is going to call her dentist.   Pt reports _0___ tablets/doses missed in the last week.    Pt reports the following side effects: _Fatigue____  Other Issues: ___sore gums______   Will follow up and call patient again in _1 week__   Thank you,  Montel Clock, PharmD, [facility]

## 2015-06-03 NOTE — Telephone Encounter (Signed)
-----   Message from Heath Lark, MD sent at 06/03/2015  1:03 PM EDT ----- Regarding: tests Tests are good Uric acid is still high but not worse Continue same dose Sprycel & allopurinol ----- Message -----    From: [facility]    Sent: 06/03/2015  11:53 AM      To: Heath Lark, MD

## 2015-06-10 ENCOUNTER — Telehealth: Payer: Self-pay | Admitting: *Deleted

## 2015-06-10 ENCOUNTER — Telehealth: Payer: Self-pay | Admitting: Pharmacist

## 2015-06-10 ENCOUNTER — Telehealth: Payer: Self-pay | Admitting: Hematology and Oncology

## 2015-06-10 ENCOUNTER — Other Ambulatory Visit (HOSPITAL_BASED_OUTPATIENT_CLINIC_OR_DEPARTMENT_OTHER): Payer: Managed Care, Other (non HMO)

## 2015-06-10 DIAGNOSIS — D471 Chronic myeloproliferative disease: Secondary | ICD-10-CM | POA: Diagnosis not present

## 2015-06-10 DIAGNOSIS — C9210 Chronic myeloid leukemia, BCR/ABL-positive, not having achieved remission: Secondary | ICD-10-CM

## 2015-06-10 LAB — BASIC METABOLIC PANEL (CC13)
ANION GAP: 7 meq/L (ref 3–11)
BUN: 15.2 mg/dL (ref 7.0–26.0)
CO2: 24 meq/L (ref 22–29)
Calcium: 9.4 mg/dL (ref 8.4–10.4)
Chloride: 107 mEq/L (ref 98–109)
Creatinine: 0.8 mg/dL (ref 0.6–1.1)
EGFR: 88 mL/min/{1.73_m2} — AB (ref >90)
GLUCOSE: 92 mg/dL (ref 70–140)
Potassium: 4.1 mEq/L (ref 3.5–5.1)
SODIUM: 138 meq/L (ref 136–145)

## 2015-06-10 LAB — CBC WITH DIFFERENTIAL/PLATELET
BASO%: 1.9 % (ref 0.0–2.0)
Basophils Absolute: 0.1 10*3/uL (ref 0.0–0.1)
EOS%: 3.7 % (ref 0.0–7.0)
Eosinophils Absolute: 0.3 10*3/uL (ref 0.0–0.5)
HEMATOCRIT: 31.8 % — AB (ref 34.8–46.6)
HEMOGLOBIN: 10.7 g/dL — AB (ref 11.6–15.9)
LYMPH#: 1.6 10*3/uL (ref 0.9–3.3)
LYMPH%: 23.5 % (ref 14.0–49.7)
MCH: 32.3 pg (ref 25.1–34.0)
MCHC: 33.6 g/dL (ref 31.5–36.0)
MCV: 96.1 fL (ref 79.5–101.0)
MONO#: 0.4 10*3/uL (ref 0.1–0.9)
MONO%: 5.1 % (ref 0.0–14.0)
NEUT#: 4.5 10*3/uL (ref 1.5–6.5)
NEUT%: 65.8 % (ref 38.4–76.8)
Platelets: 158 10*3/uL (ref 145–400)
RBC: 3.31 10*6/uL — ABNORMAL LOW (ref 3.70–5.45)
RDW: 16.7 % — AB (ref 11.2–14.5)
WBC: 6.8 10*3/uL (ref 3.9–10.3)

## 2015-06-10 LAB — URIC ACID (CC13): Uric Acid, Serum: 4.6 mg/dl (ref 2.6–7.4)

## 2015-06-10 NOTE — Telephone Encounter (Signed)
Patient stopped by after lab today to change 11/9 lab appointment to 4 pm. Patient has new time.

## 2015-06-10 NOTE — Telephone Encounter (Signed)
Called and Left VM for Christina [name] to follow up regarding her Sprycel medication. Will follow up again later in the week.

## 2015-06-10 NOTE — Telephone Encounter (Signed)
LVM for pt informing her of Dr. Calton Dach message below.  Instructed her to please call back if any questions or concerns.

## 2015-06-10 NOTE — Telephone Encounter (Signed)
-----   Message from Heath Lark, MD sent at 06/10/2015 12:45 PM EDT ----- Regarding: labs ok pls tell her labs are ok Continue sprycel Ok to stop allopurinol once current prescription runs out  ----- Message -----    From: [facility]    Sent: 06/10/2015  11:55 AM      To: Heath Lark, MD

## 2015-06-17 ENCOUNTER — Telehealth: Payer: Self-pay | Admitting: Pharmacist

## 2015-06-17 ENCOUNTER — Other Ambulatory Visit (HOSPITAL_BASED_OUTPATIENT_CLINIC_OR_DEPARTMENT_OTHER): Payer: Managed Care, Other (non HMO)

## 2015-06-17 DIAGNOSIS — D471 Chronic myeloproliferative disease: Secondary | ICD-10-CM | POA: Diagnosis not present

## 2015-06-17 DIAGNOSIS — C9210 Chronic myeloid leukemia, BCR/ABL-positive, not having achieved remission: Secondary | ICD-10-CM

## 2015-06-17 LAB — CBC WITH DIFFERENTIAL/PLATELET
BASO%: 1.2 % (ref 0.0–2.0)
BASOS ABS: 0.1 10*3/uL (ref 0.0–0.1)
EOS%: 3.1 % (ref 0.0–7.0)
Eosinophils Absolute: 0.2 10*3/uL (ref 0.0–0.5)
HEMATOCRIT: 31.9 % — AB (ref 34.8–46.6)
HGB: 10.6 g/dL — ABNORMAL LOW (ref 11.6–15.9)
LYMPH#: 1.4 10*3/uL (ref 0.9–3.3)
LYMPH%: 26.7 % (ref 14.0–49.7)
MCH: 32.1 pg (ref 25.1–34.0)
MCHC: 33.2 g/dL (ref 31.5–36.0)
MCV: 96.7 fL (ref 79.5–101.0)
MONO#: 0.5 10*3/uL (ref 0.1–0.9)
MONO%: 8.9 % (ref 0.0–14.0)
NEUT#: 3 10*3/uL (ref 1.5–6.5)
NEUT%: 60.1 % (ref 38.4–76.8)
PLATELETS: 101 10*3/uL — AB (ref 145–400)
RBC: 3.3 10*6/uL — ABNORMAL LOW (ref 3.70–5.45)
RDW: 17.2 % — ABNORMAL HIGH (ref 11.2–14.5)
WBC: 5.1 10*3/uL (ref 3.9–10.3)

## 2015-06-17 LAB — BASIC METABOLIC PANEL (CC13)
ANION GAP: 11 meq/L (ref 3–11)
BUN: 17.3 mg/dL (ref 7.0–26.0)
CALCIUM: 9.4 mg/dL (ref 8.4–10.4)
CO2: 20 mEq/L — ABNORMAL LOW (ref 22–29)
Chloride: 107 mEq/L (ref 98–109)
Creatinine: 0.9 mg/dL (ref 0.6–1.1)
EGFR: 73 mL/min/{1.73_m2} — ABNORMAL LOW (ref >90)
GLUCOSE: 87 mg/dL (ref 70–140)
Potassium: 4 mEq/L (ref 3.5–5.1)
Sodium: 138 mEq/L (ref 136–145)

## 2015-06-17 LAB — URIC ACID (CC13): Uric Acid, Serum: 4.8 mg/dl (ref 2.6–7.4)

## 2015-06-17 NOTE — Telephone Encounter (Signed)
06/17/15 - called patient and left message for follow regarding sprycel. No answer. Asked patient to call back with any questions

## 2015-06-18 ENCOUNTER — Telehealth: Payer: Self-pay | Admitting: *Deleted

## 2015-06-18 NOTE — Telephone Encounter (Signed)
-----   Message from Heath Lark, MD sent at 06/18/2015  8:14 AM EST ----- Regarding: cbc Labs are stable Continue the same ----- Message -----    From: [facility]    Sent: 06/17/2015   4:15 PM      To: Heath Lark, MD

## 2015-06-18 NOTE — Telephone Encounter (Signed)
LVM for pt informing of Dr. Gorsuch's message below.  Asked her to call nurse back if any questions.  

## 2015-06-24 ENCOUNTER — Other Ambulatory Visit: Payer: Self-pay | Admitting: Hematology and Oncology

## 2015-06-24 ENCOUNTER — Other Ambulatory Visit (HOSPITAL_BASED_OUTPATIENT_CLINIC_OR_DEPARTMENT_OTHER): Payer: Managed Care, Other (non HMO)

## 2015-06-24 ENCOUNTER — Telehealth: Payer: Self-pay | Admitting: *Deleted

## 2015-06-24 DIAGNOSIS — C9210 Chronic myeloid leukemia, BCR/ABL-positive, not having achieved remission: Secondary | ICD-10-CM

## 2015-06-24 DIAGNOSIS — D471 Chronic myeloproliferative disease: Secondary | ICD-10-CM

## 2015-06-24 LAB — BASIC METABOLIC PANEL (CC13)
Anion Gap: 10 mEq/L (ref 3–11)
BUN: 14.3 mg/dL (ref 7.0–26.0)
CALCIUM: 9.2 mg/dL (ref 8.4–10.4)
CO2: 20 mEq/L — ABNORMAL LOW (ref 22–29)
CREATININE: 0.8 mg/dL (ref 0.6–1.1)
Chloride: 107 mEq/L (ref 98–109)
EGFR: 78 mL/min/{1.73_m2} — ABNORMAL LOW (ref >90)
Glucose: 108 mg/dl (ref 70–140)
Potassium: 4.1 mEq/L (ref 3.5–5.1)
SODIUM: 137 meq/L (ref 136–145)

## 2015-06-24 LAB — CBC WITH DIFFERENTIAL/PLATELET
BASO%: 0.5 % (ref 0.0–2.0)
BASOS ABS: 0 10*3/uL (ref 0.0–0.1)
EOS ABS: 0.1 10*3/uL (ref 0.0–0.5)
EOS%: 3.7 % (ref 0.0–7.0)
HCT: 30.6 % — ABNORMAL LOW (ref 34.8–46.6)
HGB: 10.4 g/dL — ABNORMAL LOW (ref 11.6–15.9)
LYMPH%: 34.4 % (ref 14.0–49.7)
MCH: 33.2 pg (ref 25.1–34.0)
MCHC: 34 g/dL (ref 31.5–36.0)
MCV: 97.8 fL (ref 79.5–101.0)
MONO#: 0.3 10*3/uL (ref 0.1–0.9)
MONO%: 8.7 % (ref 0.0–14.0)
NEUT#: 2 10*3/uL (ref 1.5–6.5)
NEUT%: 52.7 % (ref 38.4–76.8)
PLATELETS: 136 10*3/uL — AB (ref 145–400)
RBC: 3.13 10*6/uL — AB (ref 3.70–5.45)
RDW: 18 % — ABNORMAL HIGH (ref 11.2–14.5)
WBC: 3.8 10*3/uL — ABNORMAL LOW (ref 3.9–10.3)
lymph#: 1.3 10*3/uL (ref 0.9–3.3)

## 2015-06-24 LAB — URIC ACID (CC13): Uric Acid, Serum: 4.2 mg/dl (ref 2.6–7.4)

## 2015-06-24 NOTE — Telephone Encounter (Signed)
-----   Message from Heath Lark, MD sent at 06/24/2015 12:24 PM EST ----- Regarding: labs Labs looks good ----- Message -----    From: [facility]    Sent: 06/24/2015  11:50 AM      To: Heath Lark, MD

## 2015-06-24 NOTE — Telephone Encounter (Signed)
LM with note below. To call if has questions 

## 2015-06-26 ENCOUNTER — Encounter (HOSPITAL_COMMUNITY): Payer: Self-pay

## 2015-06-29 ENCOUNTER — Ambulatory Visit (HOSPITAL_COMMUNITY)
Admission: RE | Admit: 2015-06-29 | Discharge: 2015-06-29 | Disposition: A | Discharge status: Home / Self Care | Payer: Managed Care, Other (non HMO) | Source: Ambulatory Visit | Attending: Hematology and Oncology | Admitting: Hematology and Oncology

## 2015-06-29 ENCOUNTER — Ambulatory Visit (HOSPITAL_BASED_OUTPATIENT_CLINIC_OR_DEPARTMENT_OTHER): Payer: Managed Care, Other (non HMO) | Admitting: Hematology and Oncology

## 2015-06-29 ENCOUNTER — Other Ambulatory Visit (HOSPITAL_BASED_OUTPATIENT_CLINIC_OR_DEPARTMENT_OTHER): Payer: Managed Care, Other (non HMO)

## 2015-06-29 ENCOUNTER — Telehealth: Payer: Self-pay | Admitting: Hematology and Oncology

## 2015-06-29 VITALS — BP 116/46 | HR 74 | Temp 98.1°F | Resp 20 | Ht 67.0 in | Wt 176.5 lb

## 2015-06-29 DIAGNOSIS — D471 Chronic myeloproliferative disease: Secondary | ICD-10-CM | POA: Diagnosis not present

## 2015-06-29 DIAGNOSIS — J986 Disorders of diaphragm: Secondary | ICD-10-CM | POA: Diagnosis not present

## 2015-06-29 DIAGNOSIS — C9210 Chronic myeloid leukemia, BCR/ABL-positive, not having achieved remission: Secondary | ICD-10-CM

## 2015-06-29 DIAGNOSIS — R0609 Other forms of dyspnea: Secondary | ICD-10-CM | POA: Diagnosis not present

## 2015-06-29 DIAGNOSIS — J9811 Atelectasis: Secondary | ICD-10-CM | POA: Diagnosis not present

## 2015-06-29 DIAGNOSIS — K521 Toxic gastroenteritis and colitis: Secondary | ICD-10-CM

## 2015-06-29 DIAGNOSIS — D61810 Antineoplastic chemotherapy induced pancytopenia: Secondary | ICD-10-CM | POA: Diagnosis not present

## 2015-06-29 DIAGNOSIS — R0602 Shortness of breath: Secondary | ICD-10-CM | POA: Diagnosis not present

## 2015-06-29 DIAGNOSIS — T451X5A Adverse effect of antineoplastic and immunosuppressive drugs, initial encounter: Secondary | ICD-10-CM

## 2015-06-29 LAB — URIC ACID (CC13): URIC ACID, SERUM: 5.6 mg/dL (ref 2.6–7.4)

## 2015-06-29 LAB — CBC WITH DIFFERENTIAL/PLATELET
BASO%: 1.2 % (ref 0.0–2.0)
Basophils Absolute: 0 10*3/uL (ref 0.0–0.1)
EOS%: 1.6 % (ref 0.0–7.0)
Eosinophils Absolute: 0.1 10*3/uL (ref 0.0–0.5)
HCT: 31.4 % — ABNORMAL LOW (ref 34.8–46.6)
HGB: 10.6 g/dL — ABNORMAL LOW (ref 11.6–15.9)
LYMPH%: 31.3 % (ref 14.0–49.7)
MCH: 33 pg (ref 25.1–34.0)
MCHC: 33.9 g/dL (ref 31.5–36.0)
MCV: 97.5 fL (ref 79.5–101.0)
MONO#: 0.3 10*3/uL (ref 0.1–0.9)
MONO%: 8.1 % (ref 0.0–14.0)
NEUT#: 2.1 10*3/uL (ref 1.5–6.5)
NEUT%: 57.8 % (ref 38.4–76.8)
PLATELETS: 290 10*3/uL (ref 145–400)
RBC: 3.22 10*6/uL — AB (ref 3.70–5.45)
RDW: 18.6 % — ABNORMAL HIGH (ref 11.2–14.5)
WBC: 3.6 10*3/uL — ABNORMAL LOW (ref 3.9–10.3)
lymph#: 1.1 10*3/uL (ref 0.9–3.3)

## 2015-06-29 LAB — BASIC METABOLIC PANEL (CC13)
ANION GAP: 9 meq/L (ref 3–11)
BUN: 15.3 mg/dL (ref 7.0–26.0)
CO2: 21 mEq/L — ABNORMAL LOW (ref 22–29)
Calcium: 9.2 mg/dL (ref 8.4–10.4)
Chloride: 106 mEq/L (ref 98–109)
Creatinine: 0.8 mg/dL (ref 0.6–1.1)
EGFR: 84 mL/min/{1.73_m2} — AB (ref >90)
Glucose: 85 mg/dl (ref 70–140)
POTASSIUM: 4 meq/L (ref 3.5–5.1)
Sodium: 137 mEq/L (ref 136–145)

## 2015-06-29 IMAGING — DX DG CHEST 2V
2 series · 2 of 2 positions shown · non-contrast
Comparison: Chest x-ray of [DATE]

CLINICAL DATA: Shortness of breath, evaluate for pleural effusion

EXAM:
CHEST  2 VIEW

[chest pa]
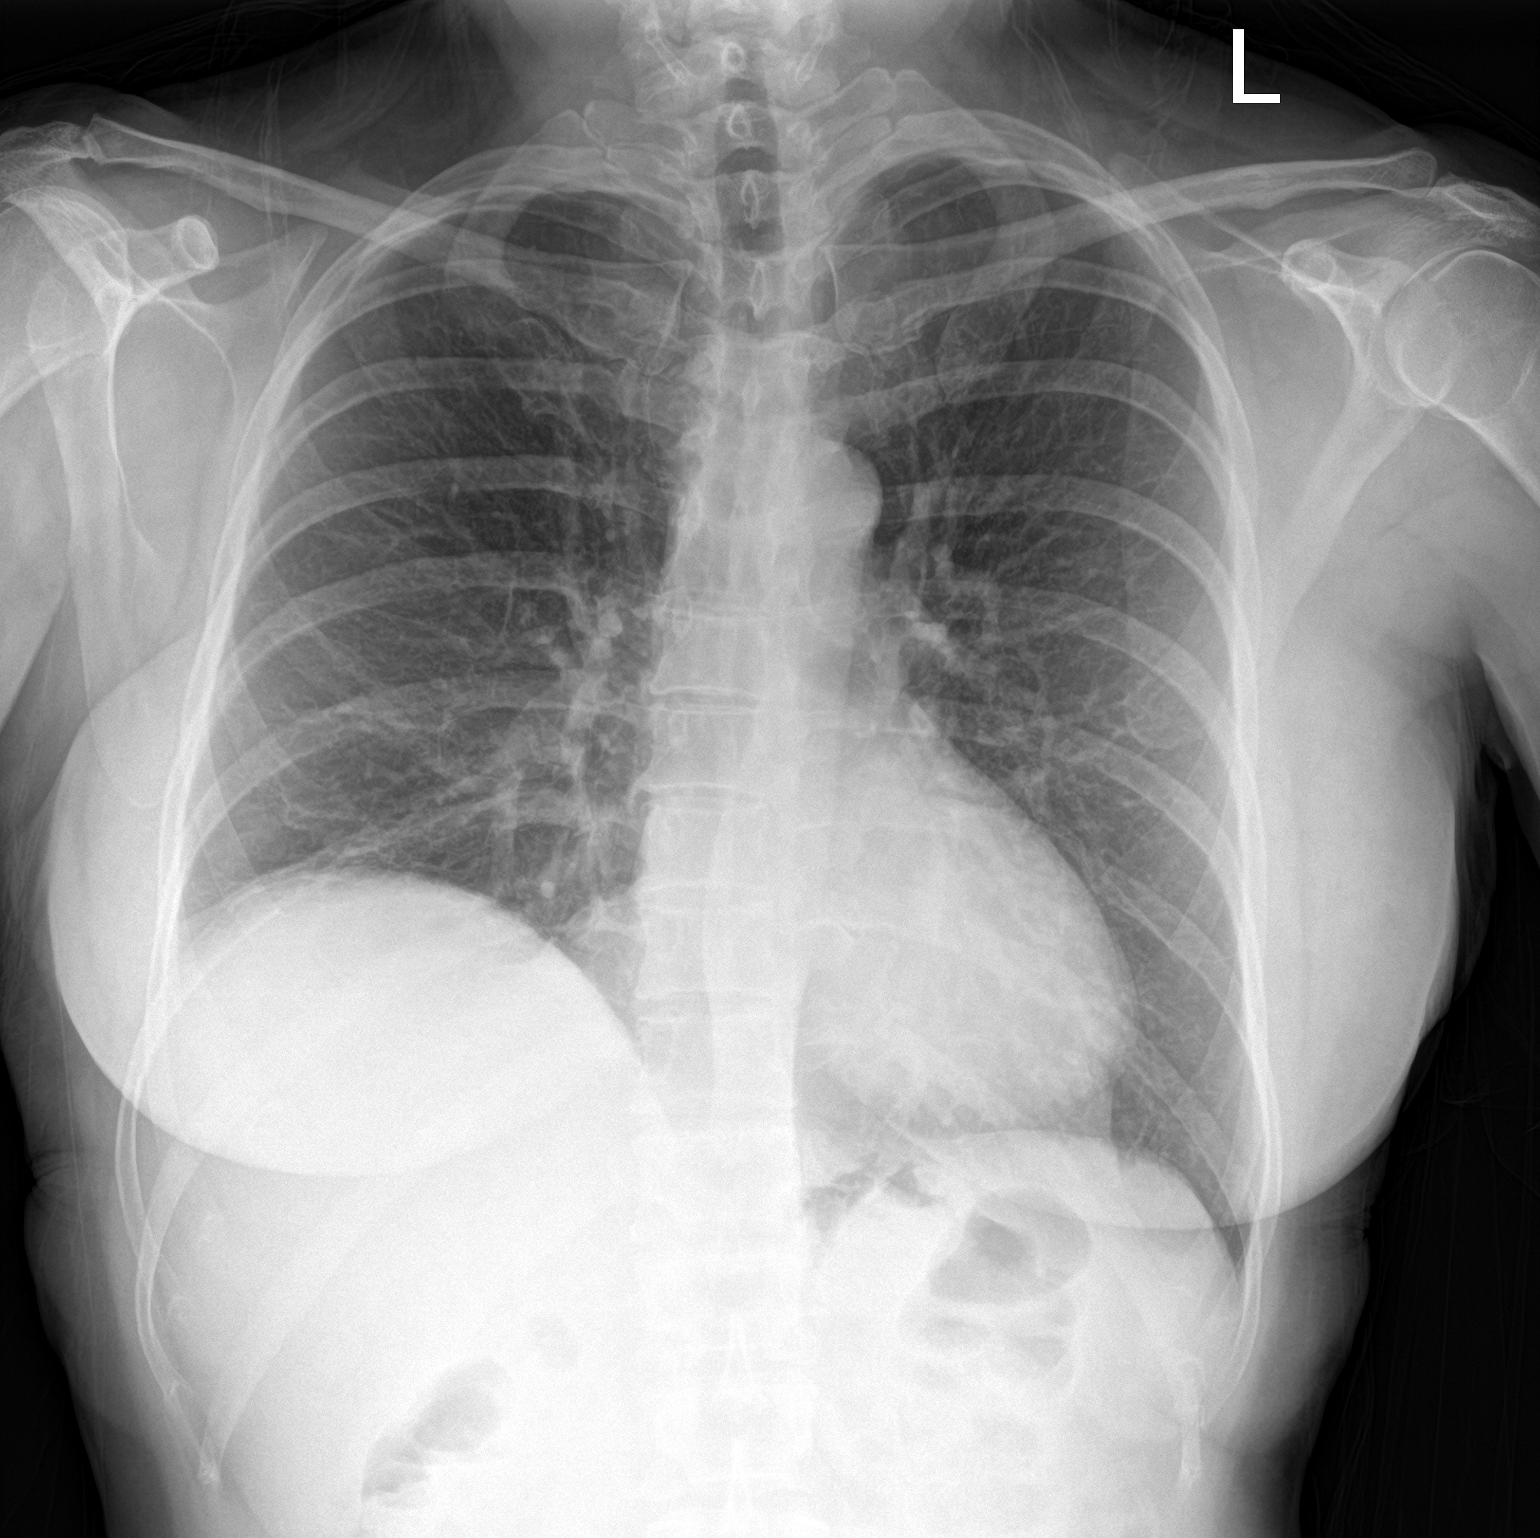

[chest lat]
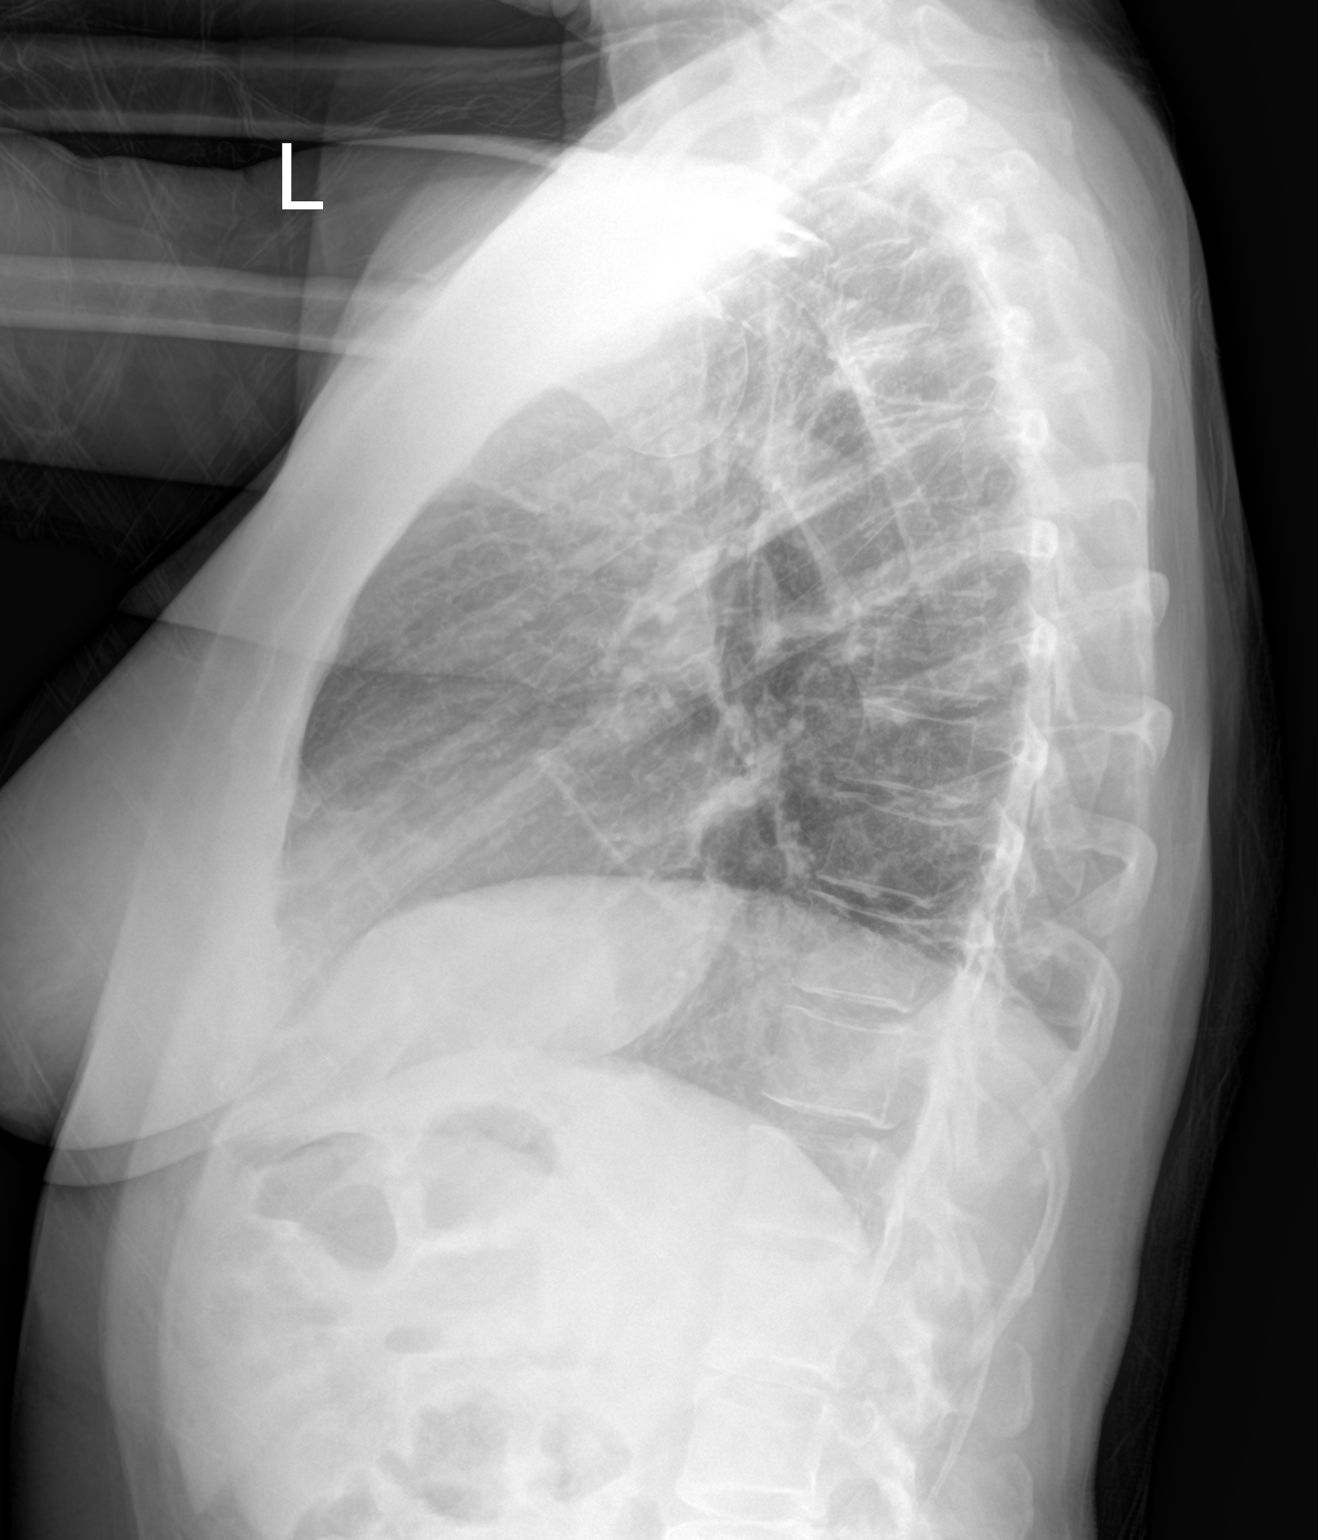

[2 of 2 positions shown; findings below may reference images not displayed]

FINDINGS: No active infiltrate or effusion is seen. Chronic elevation of the
right hemidiaphragm is noted with mild right basilar linear
atelectasis or scarring. Mediastinal and hilar contours are
unremarkable. The heart is within upper limits normal. No acute bony
abnormality is seen.
IMPRESSION: 1. No active lung disease.
2. Chronic elevation of the right hemidiaphragm with right basilar
linear atelectasis or scarring.

## 2015-06-29 NOTE — Telephone Encounter (Signed)
GAVE PATIENT AVS REPORT AND APPOINTMENTS FOR December AND January  °

## 2015-06-30 DIAGNOSIS — T451X5A Adverse effect of antineoplastic and immunosuppressive drugs, initial encounter: Secondary | ICD-10-CM | POA: Insufficient documentation

## 2015-06-30 DIAGNOSIS — J986 Disorders of diaphragm: Secondary | ICD-10-CM | POA: Insufficient documentation

## 2015-06-30 DIAGNOSIS — R0609 Other forms of dyspnea: Secondary | ICD-10-CM | POA: Insufficient documentation

## 2015-06-30 DIAGNOSIS — D61810 Antineoplastic chemotherapy induced pancytopenia: Secondary | ICD-10-CM | POA: Insufficient documentation

## 2015-06-30 DIAGNOSIS — K521 Toxic gastroenteritis and colitis: Secondary | ICD-10-CM | POA: Insufficient documentation

## 2015-06-30 HISTORY — DX: Toxic gastroenteritis and colitis: K52.1

## 2015-06-30 NOTE — Assessment & Plan Note (Signed)
She has a sensation of chest pressure and dyspnea on exertion. Chest x-ray showed no evidence of pleural effusion. Right hemidiaphragm is noted. I'm wondering whether that, in combination with mild anemia could have contracted due to to shortness of breath. She is taking aspirin. If despite dose adjustment of sprycel, she still has shortness of breath, she may need cardiology evaluation

## 2015-06-30 NOTE — Assessment & Plan Note (Signed)
This is likely due to recent treatment. The patient denies recent history of bleeding such as epistaxis, hematuria or hematochezia. She is asymptomatic from the anemia. I will observe for now.  I plan to reduce Sprycel dose to 70 mg if possible. She still has a big supply of 100 mg tablets and I recommend she reduce it to half a tablet daily. When she is due for refill next time, I plan to reduce it to 70 mg tablets in the future

## 2015-06-30 NOTE — Assessment & Plan Note (Signed)
This could be related to Sprycel. I will reduce the dose as above.

## 2015-06-30 NOTE — Progress Notes (Signed)
[facility] OFFICE PROGRESS NOTE  Patient Care Team: Burnis Medin, MD as PCP - General [name], MD (Endocrinology) [name], MD (Internal Medicine) Everett Graff, MD as Attending Physician (Obstetrics and Gynecology)  SUMMARY OF ONCOLOGIC HISTORY:   CML (chronic myeloid leukemia) ([place])   05/15/2015 Bone Marrow Biopsy BM biopsy and FISH confirmed CML [id number]   05/15/2015 - 05/20/2015 Chemotherapy She started taking Hydrea 1500 mg daily   05/15/2015 Tumor Marker BCR/ABL b2a2 87.5%, IS 49%   05/21/2015 -  Chemotherapy She started taking Sprycel 100 mg daily    INTERVAL HISTORY: Please see below for problem oriented charting. She is seen for further follow-up. She tolerated Sprycel well up off from some side effects of shortness of breath on exertion, chest pressure and intermittent diarrhea. She denies recent cough. The diarrhea is manageable and not severe.  REVIEW OF SYSTEMS:   Constitutional: Denies fevers, chills or abnormal weight loss Eyes: Denies blurriness of vision Ears, nose, mouth, throat, and face: Denies mucositis or sore throat Cardiovascular: Denies palpitation, chest discomfort or lower extremity swelling Skin: Denies abnormal skin rashes Lymphatics: Denies new lymphadenopathy or easy bruising Neurological:Denies numbness, tingling or new weaknesses Behavioral/Psych: Mood is stable, no new changes  All other systems were reviewed with the patient and are negative.  I have reviewed the past medical history, past surgical history, social history and family history with the patient and they are unchanged from previous note.  ALLERGIES:  has No Known Allergies.  MEDICATIONS:  Current Outpatient Prescriptions  Medication Sig Dispense Refill  . aspirin 81 MG tablet Take 81 mg by mouth daily.    . dasatinib (SPRYCEL) 100 MG tablet Take 1 tablet (100 mg total) by mouth daily. 90 tablet 3  . ibuprofen (ADVIL,MOTRIN) 200 MG tablet Take 200  mg by mouth every 6 (six) hours as needed.    Marland Kitchen lisinopril (PRINIVIL,ZESTRIL) 20 MG tablet Take 1 tablet (20 mg total) by mouth daily. 90 tablet 3  . SYNTHROID 150 MCG tablet Take 1 tablet (150 mcg total) by mouth daily before breakfast. 90 tablet 1  . verapamil (CALAN-SR) 240 MG CR tablet Take 1 tablet (240 mg total) by mouth at bedtime. 90 tablet 1  . zolpidem (AMBIEN) 10 MG tablet Take 1/2 to 1 po qd at night as needed. 90 tablet 1   No current facility-administered medications for this visit.    PHYSICAL EXAMINATION: ECOG PERFORMANCE STATUS: 0 - Asymptomatic  Filed Vitals:   06/29/15 1526  BP: 116/46  Pulse: 74  Temp: 98.1 F (36.7 C)  Resp: 20   Filed Weights   06/29/15 1526  Weight: 176 lb 8 oz (80.06 kg)    GENERAL:alert, no distress and comfortable SKIN: skin color, texture, turgor are normal, no rashes or significant lesions EYES: normal, Conjunctiva are pink and non-injected, sclera clear OROPHARYNX:no exudate, no erythema and lips, buccal mucosa, and tongue normal  NECK: supple, thyroid normal size, non-tender, without nodularity LYMPH:  no palpable lymphadenopathy in the cervical, axillary or inguinal LUNGS: clear to auscultation and percussion with normal breathing effort. Reduced breath sounds at The Right Lower Base HEART: regular rate & rhythm and no murmurs and no lower extremity edema ABDOMEN:abdomen soft, non-tender and normal bowel sounds Musculoskeletal:no cyanosis of digits and no clubbing  NEURO: alert & oriented x 3 with fluent speech, no focal motor/sensory deficits  LABORATORY DATA:  I have reviewed the data as listed    Component Value Date/Time   NA 137  06/29/2015 1500   NA 137 05/13/2015 1242   K 4.0 06/29/2015 1500   K 4.2 05/13/2015 1242   CL 103 05/13/2015 1242   CO2 21* 06/29/2015 1500   CO2 26 05/13/2015 1242   GLUCOSE 85 06/29/2015 1500   GLUCOSE 80 05/13/2015 1242   BUN 15.3 06/29/2015 1500   BUN 15 05/13/2015 1242   CREATININE  0.8 06/29/2015 1500   CREATININE 0.71 05/13/2015 1242   CALCIUM 9.2 06/29/2015 1500   CALCIUM 9.7 05/13/2015 1242   PROT 6.8 05/28/2015 1556   PROT 7.2 05/13/2015 1242   ALBUMIN 3.7 05/28/2015 1556   ALBUMIN 4.1 05/13/2015 1242   AST 23 05/28/2015 1556   AST 20 05/13/2015 1242   ALT 24 05/28/2015 1556   ALT 20 05/13/2015 1242   ALKPHOS 54 05/28/2015 1556   ALKPHOS 52 05/13/2015 1242   BILITOT 0.33 05/28/2015 1556   BILITOT 0.3 05/13/2015 1242   GFRNONAA >90 08/08/2014 1528   GFRAA >90 08/08/2014 1528    No results found for: SPEP, UPEP  Lab Results  Component Value Date   WBC 3.6* 06/29/2015   NEUTROABS 2.1 06/29/2015   HGB 10.6* 06/29/2015   HCT 31.4* 06/29/2015   MCV 97.5 06/29/2015   PLT 290 06/29/2015      Chemistry      Component Value Date/Time   NA 137 06/29/2015 1500   NA 137 05/13/2015 1242   K 4.0 06/29/2015 1500   K 4.2 05/13/2015 1242   CL 103 05/13/2015 1242   CO2 21* 06/29/2015 1500   CO2 26 05/13/2015 1242   BUN 15.3 06/29/2015 1500   BUN 15 05/13/2015 1242   CREATININE 0.8 06/29/2015 1500   CREATININE 0.71 05/13/2015 1242      Component Value Date/Time   CALCIUM 9.2 06/29/2015 1500   CALCIUM 9.7 05/13/2015 1242   ALKPHOS 54 05/28/2015 1556   ALKPHOS 52 05/13/2015 1242   AST 23 05/28/2015 1556   AST 20 05/13/2015 1242   ALT 24 05/28/2015 1556   ALT 20 05/13/2015 1242   BILITOT 0.33 05/28/2015 1556   BILITOT 0.3 05/13/2015 1242       RADIOGRAPHIC STUDIES: I have personally reviewed the radiological images as listed and agreed with the findings in the report. Dg Chest 2 View  06/29/2015  CLINICAL DATA:  Shortness of breath, evaluate for pleural effusion EXAM: CHEST  2 VIEW COMPARISON:  Chest x-ray of 12/03/2013 FINDINGS: No active infiltrate or effusion is seen. Chronic elevation of the right hemidiaphragm is noted with mild right basilar linear atelectasis or scarring. Mediastinal and hilar contours are unremarkable. The heart is within  upper limits normal. No acute bony abnormality is seen. IMPRESSION: 1. No active lung disease. 2. Chronic elevation of the right hemidiaphragm with right basilar linear atelectasis or scarring. Electronically Signed   By: Ivar Drape M.D.   On: 06/29/2015 17:02     ASSESSMENT & PLAN:  CML (chronic myeloid leukemia) ([place]) She tolerated Sprycel well up from some shortness of breath and occasional pressure sensation in her chest several times a day. He usually get worse with activity. She also have some intermittent diarrhea. Her CBC showed hematology remission with very mild pancytopenia. I would recommend changing her Sprycel dose to half a tablet daily and I plan to recheck BCR/ABL transcript in 2 months to assess response to treatment.  Pancytopenia due to antineoplastic chemotherapy [facility]) This is likely due to recent treatment. The patient denies recent history of bleeding such  as epistaxis, hematuria or hematochezia. She is asymptomatic from the anemia. I will observe for now.  I plan to reduce Sprycel dose to 70 mg if possible. She still has a big supply of 100 mg tablets and I recommend she reduce it to half a tablet daily. When she is due for refill next time, I plan to reduce it to 70 mg tablets in the future  Elevated diaphragm This appears to be chronic in nature. Certainly could cause occasional shortness of breath. The cause is unknown and I would recommend consideration for pulmonology evaluation  DOE (dyspnea on exertion) She has a sensation of chest pressure and dyspnea on exertion. Chest x-ray showed no evidence of pleural effusion. Right hemidiaphragm is noted. I'm wondering whether that, in combination with mild anemia could have contracted due to to shortness of breath. She is taking aspirin. If despite dose adjustment of sprycel, she still has shortness of breath, she may need cardiology evaluation  Diarrhea due to drug This could be related to Sprycel. I will reduce  the dose as above.     Orders Placed This Encounter  Procedures  . DG Chest 2 View    Standing Status: Future     Number of Occurrences: 1     Standing Expiration Date: 08/28/2016    Order Specific Question:  Reason for exam:    Answer:  shortness of breath, exclude pleural effusion    Order Specific Question:  Is the patient pregnant?    Answer:  No    Order Specific Question:  Preferred imaging location?    Answer:  [facility]  . Comprehensive metabolic panel    Standing Status: Future     Number of Occurrences:      Standing Expiration Date: 08/28/2016   All questions were answered. The patient knows to call the clinic with any problems, questions or concerns. No barriers to learning was detected. I spent 30 minutes counseling the patient face to face. The total time spent in the appointment was 40 minutes and more than 50% was on counseling and review of test results     [facility], [place], MD 06/30/2015 7:50 AM

## 2015-06-30 NOTE — Assessment & Plan Note (Signed)
She tolerated Sprycel well up from some shortness of breath and occasional pressure sensation in her chest several times a day. He usually get worse with activity. She also have some intermittent diarrhea. Her CBC showed hematology remission with very mild pancytopenia. I would recommend changing her Sprycel dose to half a tablet daily and I plan to recheck BCR/ABL transcript in 2 months to assess response to treatment.

## 2015-06-30 NOTE — Assessment & Plan Note (Signed)
This appears to be chronic in nature. Certainly could cause occasional shortness of breath. The cause is unknown and I would recommend consideration for pulmonology evaluation

## 2015-07-24 ENCOUNTER — Telehealth: Payer: Self-pay | Admitting: Hematology and Oncology

## 2015-07-24 NOTE — Telephone Encounter (Signed)
patient call to r/s 12/21 lab to 12/22. patient has new date/time.

## 2015-07-29 ENCOUNTER — Telehealth: Payer: Self-pay | Admitting: Family Medicine

## 2015-07-29 ENCOUNTER — Other Ambulatory Visit: Payer: Managed Care, Other (non HMO)

## 2015-07-29 ENCOUNTER — Other Ambulatory Visit: Payer: Self-pay | Admitting: Adult Health

## 2015-07-29 MED ORDER — AMMONIUM LACTATE 12 % EX CREA: TOPICAL_CREAM | CUTANEOUS | Status: DC | PRN

## 2015-07-29 NOTE — Telephone Encounter (Signed)
She has dry thickened skin on the soles of the feet. Please call in a supply of Lac-Hydrin 12% cream to use prn. Thank you

## 2015-07-30 ENCOUNTER — Telehealth: Payer: Self-pay | Admitting: *Deleted

## 2015-07-30 ENCOUNTER — Other Ambulatory Visit (HOSPITAL_BASED_OUTPATIENT_CLINIC_OR_DEPARTMENT_OTHER): Payer: Managed Care, Other (non HMO)

## 2015-07-30 DIAGNOSIS — C9210 Chronic myeloid leukemia, BCR/ABL-positive, not having achieved remission: Secondary | ICD-10-CM

## 2015-07-30 DIAGNOSIS — D471 Chronic myeloproliferative disease: Secondary | ICD-10-CM

## 2015-07-30 LAB — COMPREHENSIVE METABOLIC PANEL
ALT: 22 U/L (ref 0–55)
AST: 18 U/L (ref 5–34)
Albumin: 4 g/dL (ref 3.5–5.0)
Alkaline Phosphatase: 55 U/L (ref 40–150)
Anion Gap: 9 mEq/L (ref 3–11)
BUN: 15.6 mg/dL (ref 7.0–26.0)
CALCIUM: 9.3 mg/dL (ref 8.4–10.4)
CHLORIDE: 106 meq/L (ref 98–109)
CO2: 23 meq/L (ref 22–29)
Creatinine: 0.9 mg/dL (ref 0.6–1.1)
EGFR: 74 mL/min/{1.73_m2} — ABNORMAL LOW (ref >90)
Glucose: 113 mg/dl (ref 70–140)
POTASSIUM: 4.4 meq/L (ref 3.5–5.1)
Sodium: 138 mEq/L (ref 136–145)
Total Bilirubin: 0.36 mg/dL (ref 0.20–1.20)
Total Protein: 7.2 g/dL (ref 6.4–8.3)

## 2015-07-30 LAB — CBC WITH DIFFERENTIAL/PLATELET
BASO%: 1.8 % (ref 0.0–2.0)
BASOS ABS: 0.1 10*3/uL (ref 0.0–0.1)
EOS%: 4.8 % (ref 0.0–7.0)
Eosinophils Absolute: 0.2 10*3/uL (ref 0.0–0.5)
HEMATOCRIT: 37.5 % (ref 34.8–46.6)
HGB: 12.5 g/dL (ref 11.6–15.9)
LYMPH#: 1.9 10*3/uL (ref 0.9–3.3)
LYMPH%: 43 % (ref 14.0–49.7)
MCH: 32.6 pg (ref 25.1–34.0)
MCHC: 33.4 g/dL (ref 31.5–36.0)
MCV: 97.9 fL (ref 79.5–101.0)
MONO#: 0.3 10*3/uL (ref 0.1–0.9)
MONO%: 7.5 % (ref 0.0–14.0)
NEUT#: 1.9 10*3/uL (ref 1.5–6.5)
NEUT%: 42.9 % (ref 38.4–76.8)
Platelets: 296 10*3/uL (ref 145–400)
RBC: 3.84 10*6/uL (ref 3.70–5.45)
RDW: 15.3 % — ABNORMAL HIGH (ref 11.2–14.5)
WBC: 4.5 10*3/uL (ref 3.9–10.3)

## 2015-07-30 NOTE — Telephone Encounter (Signed)
-----   Message from Heath Lark, MD sent at 07/30/2015 11:37 AM EST ----- Regarding: cbc normal pls let her know ----- Message -----    From: [facility]    Sent: 07/30/2015  11:34 AM      To: Heath Lark, MD

## 2015-07-30 NOTE — Telephone Encounter (Signed)
LVM for pt informing pt of Dr. Calton Dach message below.  Please call back if any questions.

## 2015-08-05 ENCOUNTER — Telehealth: Payer: Self-pay | Admitting: Family Medicine

## 2015-08-05 MED ORDER — SYNTHROID 150 MCG PO TABS: 150.0000 ug | ORAL_TABLET | Freq: Every day | ORAL | Status: DC

## 2015-08-05 MED ORDER — VERAPAMIL HCL ER 240 MG PO TBCR: 240.0000 mg | EXTENDED_RELEASE_TABLET | Freq: Every day | ORAL | Status: DC

## 2015-08-05 NOTE — Telephone Encounter (Signed)
Done

## 2015-08-05 NOTE — Telephone Encounter (Signed)
Please call in 90 day refills for Verapamil and Synthroid to [facility]

## 2015-08-14 ENCOUNTER — Telehealth: Payer: Self-pay | Admitting: Pharmacist

## 2015-08-14 MED FILL — ZOLPIDEM TARTRATE 10 MG TAB: 10 | 90 days supply | Qty: 90 | Fill #1

## 2015-08-14 NOTE — Telephone Encounter (Signed)
08/14/15: Attempted to reach patient for follow up on oral medication: Sprycel. No answer. Left VM for patient to call back with any questions or issues.   Thank you,  Montel Clock, PharmD, [facility] [phone]

## 2015-08-19 ENCOUNTER — Other Ambulatory Visit (HOSPITAL_BASED_OUTPATIENT_CLINIC_OR_DEPARTMENT_OTHER): Payer: Managed Care, Other (non HMO)

## 2015-08-19 DIAGNOSIS — D471 Chronic myeloproliferative disease: Secondary | ICD-10-CM

## 2015-08-19 DIAGNOSIS — Z1231 Encounter for screening mammogram for malignant neoplasm of breast: Secondary | ICD-10-CM

## 2015-08-19 DIAGNOSIS — C9210 Chronic myeloid leukemia, BCR/ABL-positive, not having achieved remission: Secondary | ICD-10-CM

## 2015-08-19 LAB — CBC WITH DIFFERENTIAL/PLATELET
BASO%: 1.5 % (ref 0.0–2.0)
Basophils Absolute: 0.1 10*3/uL (ref 0.0–0.1)
EOS%: 7.4 % — AB (ref 0.0–7.0)
Eosinophils Absolute: 0.4 10*3/uL (ref 0.0–0.5)
HCT: 37.6 % (ref 34.8–46.6)
HGB: 12.3 g/dL (ref 11.6–15.9)
LYMPH%: 40.3 % (ref 14.0–49.7)
MCH: 32.2 pg (ref 25.1–34.0)
MCHC: 32.8 g/dL (ref 31.5–36.0)
MCV: 98.1 fL (ref 79.5–101.0)
MONO#: 0.4 10*3/uL (ref 0.1–0.9)
MONO%: 8.1 % (ref 0.0–14.0)
NEUT%: 42.7 % (ref 38.4–76.8)
NEUTROS ABS: 2.2 10*3/uL (ref 1.5–6.5)
Platelets: 273 10*3/uL (ref 145–400)
RBC: 3.83 10*6/uL (ref 3.70–5.45)
RDW: 14.3 % (ref 11.2–14.5)
WBC: 5.1 10*3/uL (ref 3.9–10.3)
lymph#: 2 10*3/uL (ref 0.9–3.3)

## 2015-08-20 ENCOUNTER — Other Ambulatory Visit: Payer: Self-pay | Admitting: Hematology and Oncology

## 2015-08-20 ENCOUNTER — Telehealth: Payer: Self-pay | Admitting: *Deleted

## 2015-08-20 DIAGNOSIS — C9210 Chronic myeloid leukemia, BCR/ABL-positive, not having achieved remission: Secondary | ICD-10-CM

## 2015-08-20 NOTE — Telephone Encounter (Signed)
LVM for pt to return nurse's call regarding her lab work yesterday.  Unfortunately BCR-ABL and CMET were not done as planned by Dr. Alvy Bimler.  Unsure why orders were not there, but they do need to be done.  CBC is good and pt can wait until end of this month to come back for BCR-ABL per Dr. Alvy Bimler.  We will need to r/s office visit for next week to one week after pt can come back to get BCR-ABL drawn. Marland Kitchen

## 2015-08-21 NOTE — Telephone Encounter (Signed)
LVM for pt again asking her to please return nurse's call.  Explained we need to schedule her back for lab to do BCR/ABL and then r/s Dr. Calton Dach appt to one week after lab appt.Marland Kitchen

## 2015-08-24 ENCOUNTER — Telehealth: Payer: Self-pay | Admitting: *Deleted

## 2015-08-24 NOTE — Telephone Encounter (Signed)
LVM for pt (3rd message) asking her to please return nurse's call regarding lab work.  Unfortunately,  BCR/ABL was not drawn last week w/ other labs.   We need to r/s pt for this lab before she sees Dr. Alvy Bimler again.  Will need to r/s office visit on 1/18 until after BCR/ABL done.   Dr. Alvy Bimler said her labs were good and it is ok to wait until the end of this month to do BCR/ABL.   Asked pt to please call nurse back so we can get her rescheduled.

## 2015-08-25 ENCOUNTER — Telehealth: Payer: Self-pay | Admitting: Hematology and Oncology

## 2015-08-25 ENCOUNTER — Other Ambulatory Visit: Payer: Self-pay | Admitting: Hematology and Oncology

## 2015-08-25 ENCOUNTER — Other Ambulatory Visit: Payer: Self-pay | Admitting: *Deleted

## 2015-08-25 NOTE — Telephone Encounter (Signed)
Left message for patient re new appointments per 1/17 pof. Lab 2/2 and f/u 2/9. Schedule mailed - 1/18 fu cxd.

## 2015-08-25 NOTE — Telephone Encounter (Signed)
I spoke with the patient's husband, informing him that we need to reschedule Christina [name] appointment. Unfortunately, the lab did not draw the blood tests for BCR/ABL. He will relay the message to the patient to reschedule her appointments.

## 2015-08-26 ENCOUNTER — Ambulatory Visit: Payer: Managed Care, Other (non HMO) | Admitting: Hematology and Oncology

## 2015-09-02 ENCOUNTER — Ambulatory Visit: Payer: Managed Care, Other (non HMO)

## 2015-09-09 ENCOUNTER — Ambulatory Visit
Admission: RE | Admit: 2015-09-09 | Discharge: 2015-09-09 | Disposition: A | Discharge status: Home / Self Care | Payer: Managed Care, Other (non HMO) | Source: Ambulatory Visit

## 2015-09-09 IMAGING — MG MM SCREENING BREAST TOMO BILATERAL
8 series · 9 of 24 positions shown · non-contrast
Comparison: None available.

CLINICAL DATA: Screening.

EXAM:
DIGITAL SCREENING BILATERAL MAMMOGRAM WITH 3D TOMO WITH CAD

[R MLO]
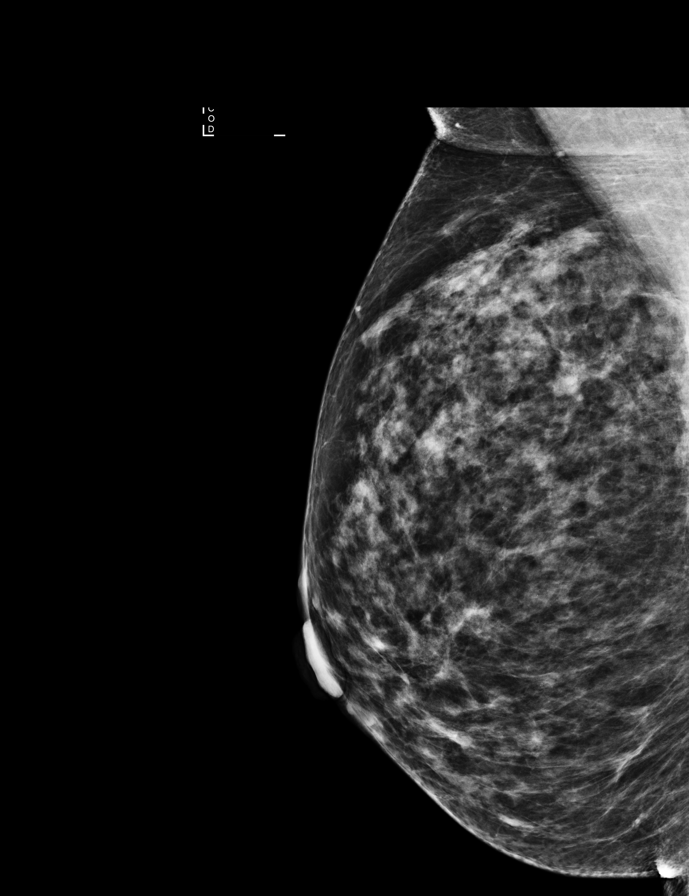

[L MLO]
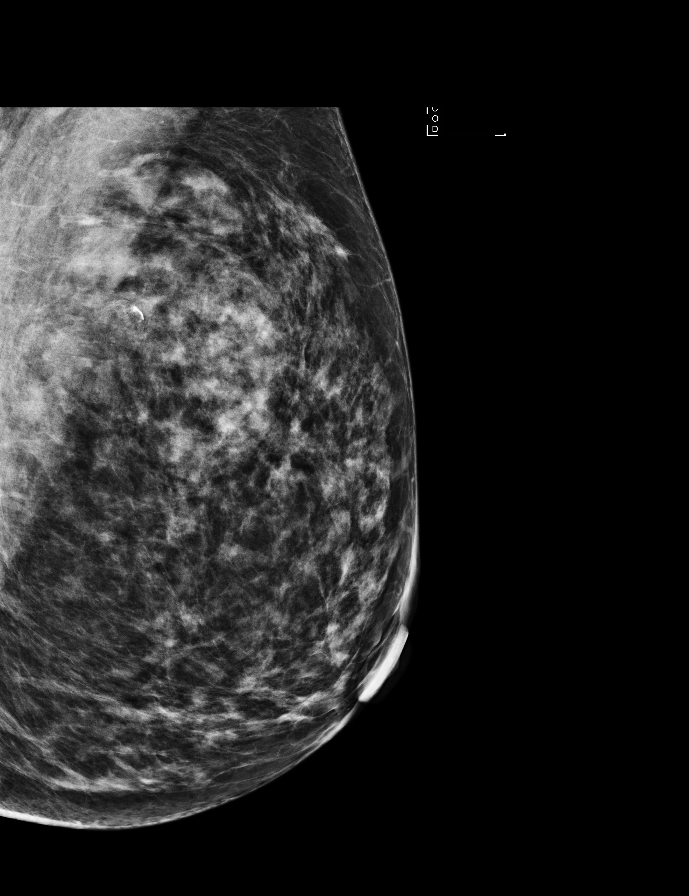

[R CC]
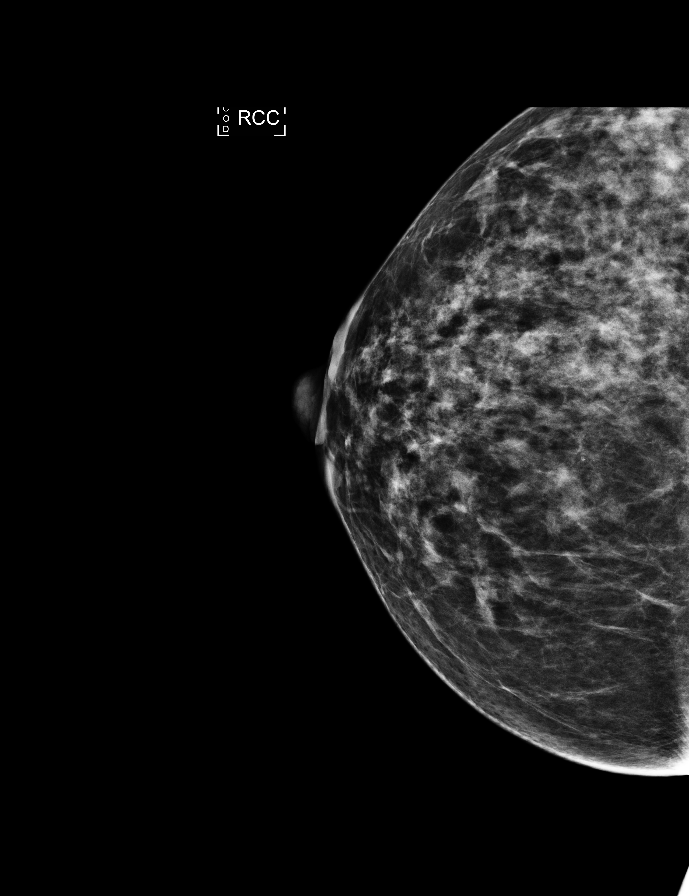

[L CC]
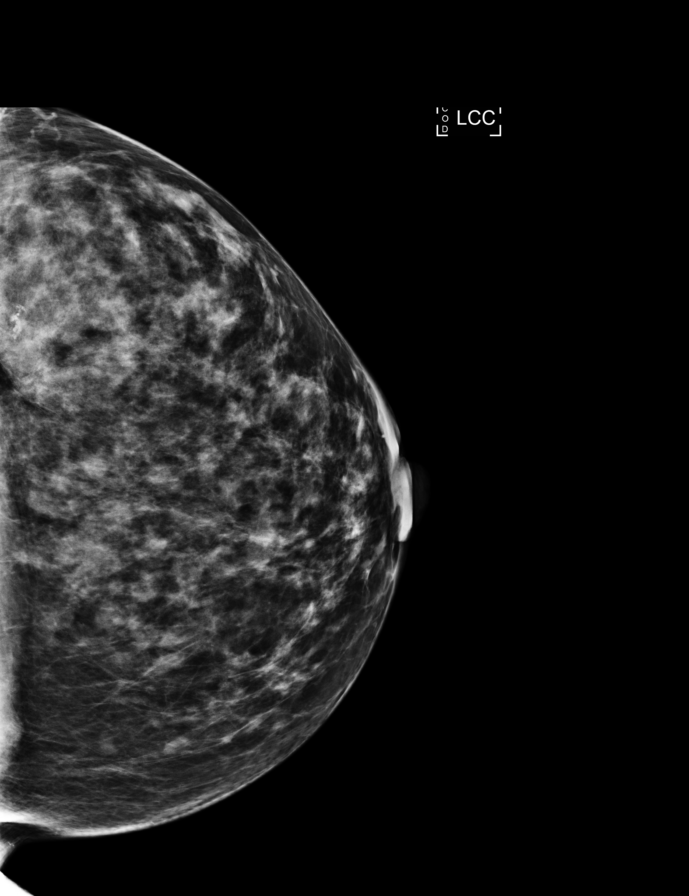

[L CC tomo · 2 of 87 frames shown]
[frame 29/87]
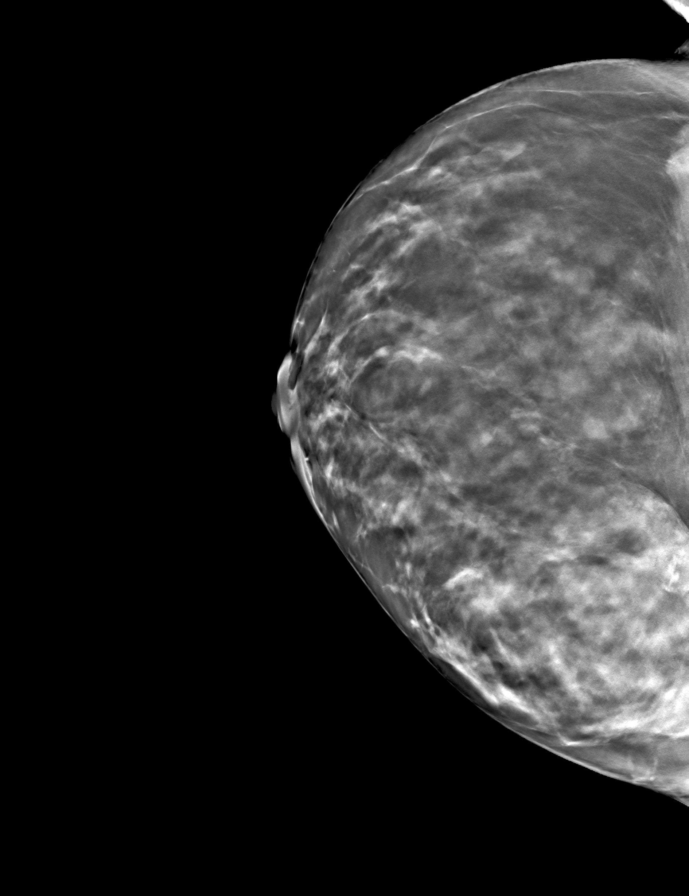
[frame 44/87]
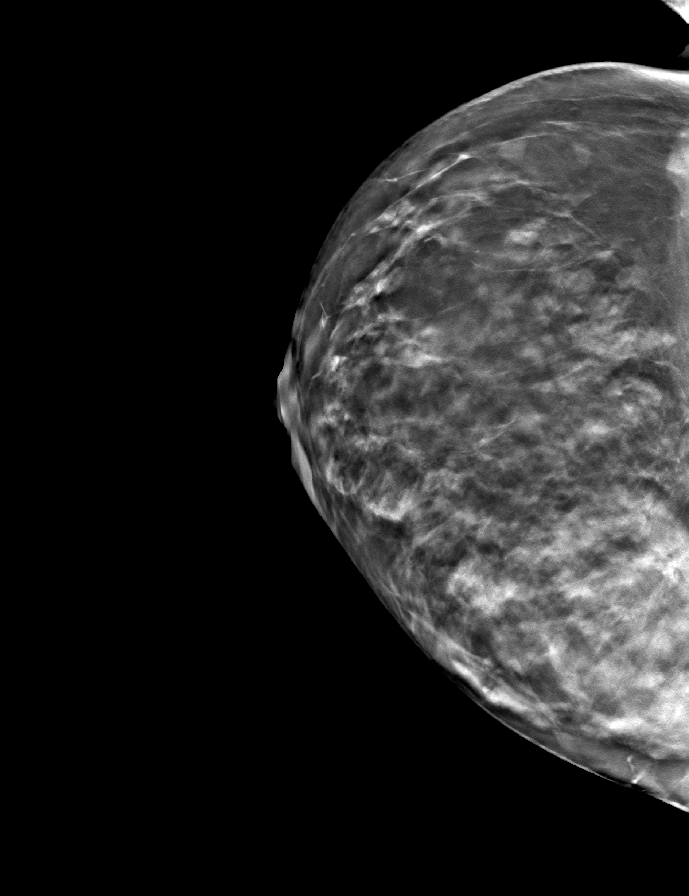

[R MLO tomo · tomo slice 41/81.0]
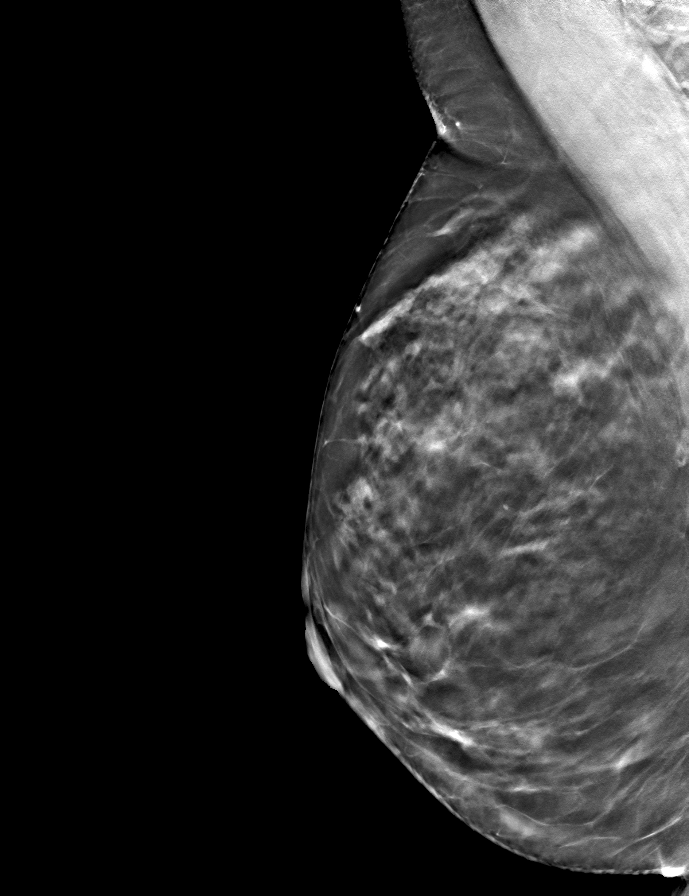

[L MLO tomo · tomo slice 43/84.0]
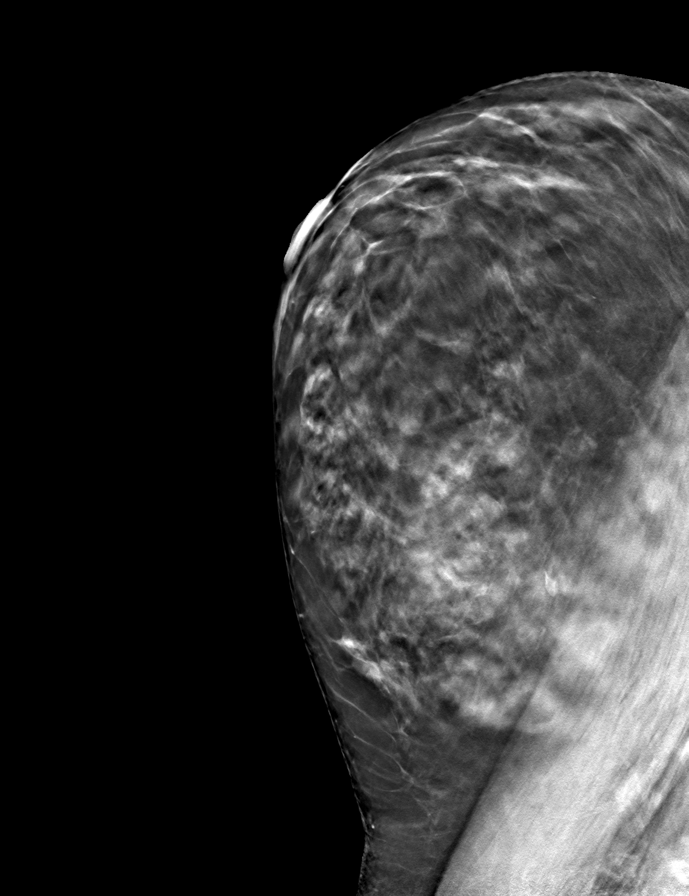

[R CC tomo · tomo slice 40/79.0]
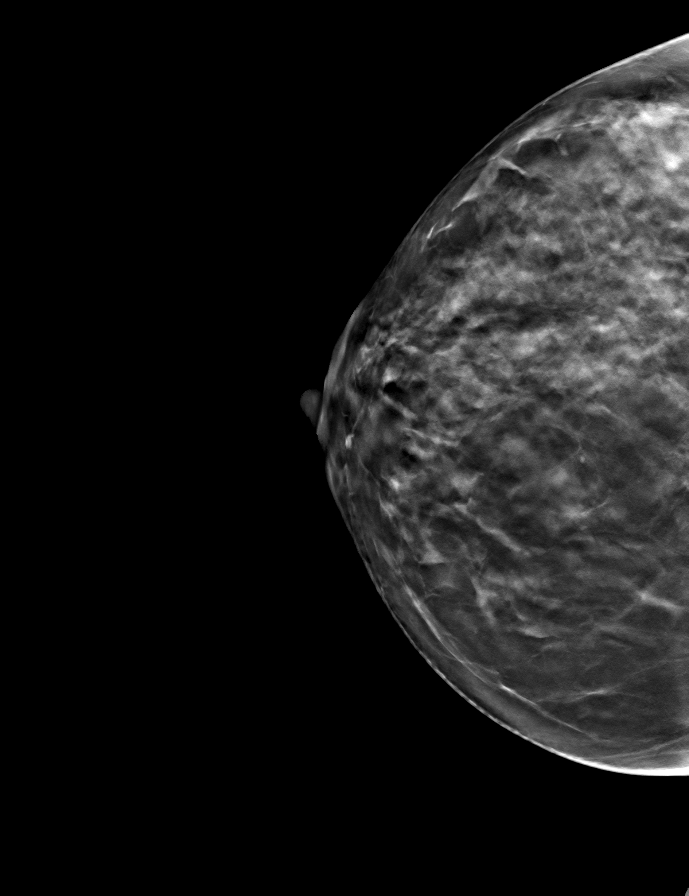

[9 of 24 positions shown; findings below may reference images not displayed]

ACR Breast Density Category c: The breast tissue is heterogeneously
dense, which may obscure small masses.
FINDINGS: There are no findings suspicious for malignancy. Images were
processed with CAD.
IMPRESSION: No mammographic evidence of malignancy. A result letter of this
screening mammogram will be mailed directly to the patient.

RECOMMENDATION:
Screening mammogram in one year. (Code:[[facility]])

BI-RADS CATEGORY  1: Negative.

## 2015-09-10 ENCOUNTER — Other Ambulatory Visit (HOSPITAL_BASED_OUTPATIENT_CLINIC_OR_DEPARTMENT_OTHER): Payer: Managed Care, Other (non HMO)

## 2015-09-10 DIAGNOSIS — C9210 Chronic myeloid leukemia, BCR/ABL-positive, not having achieved remission: Secondary | ICD-10-CM | POA: Diagnosis not present

## 2015-09-10 DIAGNOSIS — D471 Chronic myeloproliferative disease: Secondary | ICD-10-CM

## 2015-09-10 LAB — CBC WITH DIFFERENTIAL/PLATELET
BASO%: 1 % (ref 0.0–2.0)
BASOS ABS: 0.1 10*3/uL (ref 0.0–0.1)
EOS%: 9.5 % — AB (ref 0.0–7.0)
Eosinophils Absolute: 0.5 10*3/uL (ref 0.0–0.5)
HEMATOCRIT: 37.3 % (ref 34.8–46.6)
HGB: 12.8 g/dL (ref 11.6–15.9)
LYMPH#: 1.6 10*3/uL (ref 0.9–3.3)
LYMPH%: 30.4 % (ref 14.0–49.7)
MCH: 32.8 pg (ref 25.1–34.0)
MCHC: 34.3 g/dL (ref 31.5–36.0)
MCV: 95.6 fL (ref 79.5–101.0)
MONO#: 0.4 10*3/uL (ref 0.1–0.9)
MONO%: 7.9 % (ref 0.0–14.0)
NEUT#: 2.6 10*3/uL (ref 1.5–6.5)
NEUT%: 51.2 % (ref 38.4–76.8)
PLATELETS: 237 10*3/uL (ref 145–400)
RBC: 3.9 10*6/uL (ref 3.70–5.45)
RDW: 12.9 % (ref 11.2–14.5)
WBC: 5.2 10*3/uL (ref 3.9–10.3)

## 2015-09-10 LAB — COMPREHENSIVE METABOLIC PANEL WITH GFR
ALT: 22 U/L (ref 0–55)
AST: 18 U/L (ref 5–34)
Albumin: 3.8 g/dL (ref 3.5–5.0)
Alkaline Phosphatase: 43 U/L (ref 40–150)
Anion Gap: 11 meq/L (ref 3–11)
BUN: 14.4 mg/dL (ref 7.0–26.0)
CO2: 21 meq/L — ABNORMAL LOW (ref 22–29)
Calcium: 9.2 mg/dL (ref 8.4–10.4)
Chloride: 104 meq/L (ref 98–109)
Creatinine: 0.8 mg/dL (ref 0.6–1.1)
EGFR: 80 ml/min/1.73 m2 — ABNORMAL LOW
Glucose: 98 mg/dL (ref 70–140)
Potassium: 4.1 meq/L (ref 3.5–5.1)
Sodium: 136 meq/L (ref 136–145)
Total Bilirubin: 0.42 mg/dL (ref 0.20–1.20)
Total Protein: 6.8 g/dL (ref 6.4–8.3)

## 2015-09-16 ENCOUNTER — Telehealth: Payer: Self-pay | Admitting: Pharmacist

## 2015-09-16 ENCOUNTER — Telehealth: Payer: Self-pay | Admitting: Family Medicine

## 2015-09-16 MED ORDER — LISINOPRIL 20 MG PO TABS: 20.0000 mg | ORAL_TABLET | Freq: Every day | ORAL | Status: DC

## 2015-09-16 MED FILL — LISINOPRIL 20 MG TABLET: 20 | 90 days supply | Qty: 90 | Fill #0

## 2015-09-16 NOTE — Telephone Encounter (Signed)
Sent to the pharmacy by e-scribe.  Per WP, okay to fill for 1 year

## 2015-09-16 NOTE — Telephone Encounter (Signed)
Please refill Lisinopril for 90 days with refills to [facility]

## 2015-09-16 NOTE — Telephone Encounter (Signed)
09/16/15: Attempted to reach patient for follow up on oral medication: Sprycel. No answer. Left VM for patient to call back with any questions or issues. Patient had labs on 2/2 and has follow up on 2/9 with Dr. Alvy Bimler  Thank you,  Montel Clock, PharmD, [facility] [phone]

## 2015-09-17 ENCOUNTER — Telehealth: Payer: Self-pay | Admitting: Hematology and Oncology

## 2015-09-17 ENCOUNTER — Ambulatory Visit (HOSPITAL_BASED_OUTPATIENT_CLINIC_OR_DEPARTMENT_OTHER): Payer: Managed Care, Other (non HMO) | Admitting: Hematology and Oncology

## 2015-09-17 VITALS — BP 122/58 | HR 71 | Temp 97.7°F | Resp 18 | Ht 67.0 in | Wt 178.8 lb

## 2015-09-17 DIAGNOSIS — K521 Toxic gastroenteritis and colitis: Secondary | ICD-10-CM | POA: Diagnosis not present

## 2015-09-17 DIAGNOSIS — C9210 Chronic myeloid leukemia, BCR/ABL-positive, not having achieved remission: Secondary | ICD-10-CM | POA: Diagnosis not present

## 2015-09-17 DIAGNOSIS — J986 Disorders of diaphragm: Secondary | ICD-10-CM

## 2015-09-17 NOTE — Assessment & Plan Note (Signed)
She has chronic elevated right diaphragm. Examination show reduced breath sounds but she is not symptomatic. Observe only.

## 2015-09-17 NOTE — Progress Notes (Signed)
[place] OFFICE PROGRESS NOTE  Patient Care Team: Burnis Medin, MD as PCP - General [name], MD (Endocrinology) [name], MD (Internal Medicine) Everett Graff, MD as Attending Physician (Obstetrics and Gynecology)  SUMMARY OF ONCOLOGIC HISTORY:   CML (chronic myeloid leukemia) ([place])   05/15/2015 Bone Marrow Biopsy BM biopsy and FISH confirmed CML [id number]   05/15/2015 - 05/20/2015 Chemotherapy She started taking Hydrea 1500 mg daily   05/15/2015 Tumor Marker BCR/ABL b2a2 87.5%, IS 49%   05/21/2015 -  Chemotherapy She started taking Sprycel 100 mg daily   06/29/2015 Adverse Reaction She had diarrhea. Dose of Srycel is reduced to 50 mg daily   09/10/2015 Tumor Marker BCR/ABL b2a2 0.21%, IS 0.1743%    INTERVAL HISTORY: Please see below for problem oriented charting.  she returns for further follow-up. She tolerated reduced dose treatment better. Denies worsening shortness of breath. She complained of mild fatigue. She denies excessive diarrhea. Denies skin rashes. Denies recent infection  REVIEW OF SYSTEMS:   Constitutional: Denies fevers, chills or abnormal weight loss Eyes: Denies blurriness of vision Ears, nose, mouth, throat, and face: Denies mucositis or sore throat Respiratory: Denies cough, dyspnea or wheezes Cardiovascular: Denies palpitation, chest discomfort or lower extremity swelling Gastrointestinal:  Denies nausea, heartburn or change in bowel habits Skin: Denies abnormal skin rashes Lymphatics: Denies new lymphadenopathy or easy bruising Neurological:Denies numbness, tingling or new weaknesses Behavioral/Psych: Mood is stable, no new changes  All other systems were reviewed with the patient and are negative.  I have reviewed the past medical history, past surgical history, social history and family history with the patient and they are unchanged from previous note.  ALLERGIES:  has No Known Allergies.  MEDICATIONS:  Current  Outpatient Prescriptions  Medication Sig Dispense Refill  . ammonium lactate (LAC-HYDRIN) 12 % cream Apply topically as needed for dry skin. 385 g 3  . aspirin 81 MG tablet Take 81 mg by mouth daily.    . dasatinib (SPRYCEL) 100 MG tablet Take 1 tablet (100 mg total) by mouth daily. 90 tablet 3  . ibuprofen (ADVIL,MOTRIN) 200 MG tablet Take 200 mg by mouth every 6 (six) hours as needed.    Marland Kitchen lisinopril (PRINIVIL,ZESTRIL) 20 MG tablet Take 1 tablet (20 mg total) by mouth daily. 90 tablet 3  . SYNTHROID 150 MCG tablet Take 1 tablet (150 mcg total) by mouth daily before breakfast. 90 tablet 1  . verapamil (CALAN-SR) 240 MG CR tablet Take 1 tablet (240 mg total) by mouth at bedtime. 90 tablet 1  . zolpidem (AMBIEN) 10 MG tablet Take 1/2 to 1 po qd at night as needed. 90 tablet 1   No current facility-administered medications for this visit.    PHYSICAL EXAMINATION: ECOG PERFORMANCE STATUS: 0 - Asymptomatic  Filed Vitals:   09/17/15 1221  BP: 122/58  Pulse: 71  Temp: 97.7 F (36.5 C)  Resp: 18   Filed Weights   09/17/15 1221  Weight: 178 lb 12.8 oz (81.103 kg)    GENERAL:alert, no distress and comfortable SKIN: skin color, texture, turgor are normal, no rashes or significant lesions EYES: normal, Conjunctiva are pink and non-injected, sclera clear OROPHARYNX:no exudate, no erythema and lips, buccal mucosa, and tongue normal  NECK: supple, thyroid normal size, non-tender, without nodularity LYMPH:  no palpable lymphadenopathy in the cervical, axillary or inguinal LUNGS: clear to auscultation  With reduce breath sounds on the right lower base, normal breathing effort HEART: regular rate & rhythm and no murmurs  and no lower extremity edema ABDOMEN:abdomen soft, non-tender and normal bowel sounds Musculoskeletal:no cyanosis of digits and no clubbing  NEURO: alert & oriented x 3 with fluent speech, no focal motor/sensory deficits  LABORATORY DATA:  I have reviewed the data as  listed    Component Value Date/Time   NA 136 09/10/2015 0832   NA 137 05/13/2015 1242   K 4.1 09/10/2015 0832   K 4.2 05/13/2015 1242   CL 103 05/13/2015 1242   CO2 21* 09/10/2015 0832   CO2 26 05/13/2015 1242   GLUCOSE 98 09/10/2015 0832   GLUCOSE 80 05/13/2015 1242   BUN 14.4 09/10/2015 0832   BUN 15 05/13/2015 1242   CREATININE 0.8 09/10/2015 0832   CREATININE 0.71 05/13/2015 1242   CALCIUM 9.2 09/10/2015 0832   CALCIUM 9.7 05/13/2015 1242   PROT 6.8 09/10/2015 0832   PROT 7.2 05/13/2015 1242   ALBUMIN 3.8 09/10/2015 0832   ALBUMIN 4.1 05/13/2015 1242   AST 18 09/10/2015 0832   AST 20 05/13/2015 1242   ALT 22 09/10/2015 0832   ALT 20 05/13/2015 1242   ALKPHOS 43 09/10/2015 0832   ALKPHOS 52 05/13/2015 1242   BILITOT 0.42 09/10/2015 0832   BILITOT 0.3 05/13/2015 1242   GFRNONAA >90 08/08/2014 1528   GFRAA >90 08/08/2014 1528    No results found for: SPEP, UPEP  Lab Results  Component Value Date   WBC 5.2 09/10/2015   NEUTROABS 2.6 09/10/2015   HGB 12.8 09/10/2015   HCT 37.3 09/10/2015   MCV 95.6 09/10/2015   PLT 237 09/10/2015      Chemistry      Component Value Date/Time   NA 136 09/10/2015 0832   NA 137 05/13/2015 1242   K 4.1 09/10/2015 0832   K 4.2 05/13/2015 1242   CL 103 05/13/2015 1242   CO2 21* 09/10/2015 0832   CO2 26 05/13/2015 1242   BUN 14.4 09/10/2015 0832   BUN 15 05/13/2015 1242   CREATININE 0.8 09/10/2015 0832   CREATININE 0.71 05/13/2015 1242      Component Value Date/Time   CALCIUM 9.2 09/10/2015 0832   CALCIUM 9.7 05/13/2015 1242   ALKPHOS 43 09/10/2015 0832   ALKPHOS 52 05/13/2015 1242   AST 18 09/10/2015 0832   AST 20 05/13/2015 1242   ALT 22 09/10/2015 0832   ALT 20 05/13/2015 1242   BILITOT 0.42 09/10/2015 0832   BILITOT 0.3 05/13/2015 1242      ASSESSMENT & PLAN:  CML (chronic myeloid leukemia) ([place])  She tolerated treatment better with reduced dose Sprycel. Recent blood work showed that she has almost achieved   Molecular response. CBC has normalized. I recommend she continue on reduced dose Sprycel and recheck in 3 months  Diarrhea due to drug  This is minimum and it does not bother her.  continue conservative management   Elevated diaphragm  She has chronic elevated right diaphragm. Examination show reduced breath sounds but she is not symptomatic. Observe only.   Orders Placed This Encounter  Procedures  . CBC with Differential/Platelet    Standing Status: Future     Number of Occurrences:      Standing Expiration Date: 10/21/2016  . Comprehensive metabolic panel    Standing Status: Future     Number of Occurrences:      Standing Expiration Date: 10/21/2016  . BCR-ABL    With RT-PCR technique    Standing Status: Future     Number of Occurrences:  Standing Expiration Date: 10/21/2016   All questions were answered. The patient knows to call the clinic with any problems, questions or concerns. No barriers to learning was detected. I spent 15 minutes counseling the patient face to face. The total time spent in the appointment was 20 minutes and more than 50% was on counseling and review of test results     [facility], [place], MD 09/17/2015 12:58 PM

## 2015-09-17 NOTE — Telephone Encounter (Signed)
Appointments made and avs printed °

## 2015-09-17 NOTE — Assessment & Plan Note (Signed)
She tolerated treatment better with reduced dose Sprycel. Recent blood work showed that she has almost achieved  Molecular response. CBC has normalized. I recommend she continue on reduced dose Sprycel and recheck in 3 months

## 2015-09-17 NOTE — Assessment & Plan Note (Signed)
This is minimum and it does not bother her.  continue conservative management

## 2015-09-18 ENCOUNTER — Other Ambulatory Visit (INDEPENDENT_AMBULATORY_CARE_PROVIDER_SITE_OTHER): Payer: Managed Care, Other (non HMO)

## 2015-09-18 ENCOUNTER — Other Ambulatory Visit: Payer: Self-pay | Admitting: Internal Medicine

## 2015-09-18 DIAGNOSIS — E039 Hypothyroidism, unspecified: Secondary | ICD-10-CM | POA: Diagnosis not present

## 2015-09-18 DIAGNOSIS — R5383 Other fatigue: Secondary | ICD-10-CM

## 2015-09-18 LAB — T3, FREE: T3 FREE: 3.1 pg/mL (ref 2.3–4.2)

## 2015-09-18 LAB — TSH: TSH: 1.61 u[IU]/mL (ref 0.35–4.50)

## 2015-09-18 LAB — T4, FREE: Free T4: 1.14 ng/dL (ref 0.60–1.60)

## 2015-09-18 NOTE — Progress Notes (Signed)
On therapy for aml and more fatigue than usual . Check thyroid parameters . Orders placed.

## 2015-09-24 ENCOUNTER — Telehealth: Payer: Self-pay | Admitting: Family Medicine

## 2015-09-24 NOTE — Telephone Encounter (Signed)
The office of Dr. Idelle Crouch at [facility] had asked for records of old exams or hearing tests, but none are available. 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 Hopefully they can go ahead and make her an appointment soon

## 2015-09-25 NOTE — Telephone Encounter (Signed)
Dr Sarajane Jews, I  just want to give you an update on your wife referral suandrea and myself have called the office trying to get your wife schedule ,The office manager from Dr .Idelle Crouch, [facility] [phone] called back and said they can not see the patient without the proper notes and dx . Please advise

## 2015-09-25 NOTE — Telephone Encounter (Signed)
I left another message with Skeet Simmer, the assistant chair requesting a call back to see if we can get patient schedule without records, advised we could arrange a peer to peer with Dr. Regis Bill if needed.  Will update you once I hear back from [place].

## 2015-10-06 ENCOUNTER — Other Ambulatory Visit: Payer: Self-pay | Admitting: Hematology and Oncology

## 2015-10-06 ENCOUNTER — Telehealth: Payer: Self-pay | Admitting: *Deleted

## 2015-10-06 DIAGNOSIS — C9210 Chronic myeloid leukemia, BCR/ABL-positive, not having achieved remission: Secondary | ICD-10-CM

## 2015-10-06 MED ORDER — DASATINIB 50 MG PO TABS: 50.0000 mg | ORAL_TABLET | Freq: Every day | ORAL | Status: DC

## 2015-10-06 MED FILL — *SPRYCEL 50 MG TABLET: 50 | 90 days supply | Qty: 90 | Fill #0

## 2015-10-06 MED FILL — SYNTHROID 150 MCG TABLET: 150 | 90 days supply | Qty: 90 | Fill #1

## 2015-10-06 NOTE — Telephone Encounter (Signed)
Pt called to say she has 1 week left on Sprycel. Needs refill

## 2015-10-06 NOTE — Telephone Encounter (Signed)
I sent new refill at reduced dose 50 mg Sprycel to [facility]

## 2015-10-13 ENCOUNTER — Telehealth: Payer: Self-pay | Admitting: Pharmacist

## 2015-10-13 NOTE — Telephone Encounter (Signed)
..  Oral Chemotherapy Follow-Up Form  Attempted to reach patient to follow up on Sprycel. Unable to reach patient, left VM requesting patient to call with any concerns regarding Sprycel.  Will follow up and call patient again in ___4 weeks______   Thank you,  Theone Murdoch, PharmD, [facility]

## 2015-11-02 MED FILL — VERAPAMIL ER 240 MG TABLET: 240 | 90 days supply | Qty: 90 | Fill #1

## 2015-11-11 ENCOUNTER — Other Ambulatory Visit: Payer: Self-pay | Admitting: Internal Medicine

## 2015-11-11 MED FILL — ZOLPIDEM TARTRATE 10 MG TAB: 10 | 90 days supply | Qty: 90 | Fill #0

## 2015-11-11 NOTE — Telephone Encounter (Signed)
Ok x 2  

## 2015-11-11 NOTE — Telephone Encounter (Signed)
Please call in a refill for Zolpidem (#90 with one rf) to [place]

## 2015-11-11 NOTE — Telephone Encounter (Signed)
Called to the pharmacy and left on machine. 

## 2015-12-07 ENCOUNTER — Encounter: Payer: Self-pay | Admitting: Pharmacist

## 2015-12-07 NOTE — Progress Notes (Signed)
Expand All Collapse All   12/07/2015 Attempted to call patient for follow-up on Spycel. No answer, left voicemail for patient to call with any questions or concerns.  Unable to contact over past several months, have left numerous voicemails without return. Will discharge patient from follow-up with the [facility].  Thank you, Johny Drilling, PharmD, [facility] [phone]

## 2015-12-14 ENCOUNTER — Telehealth: Payer: Self-pay | Admitting: Hematology and Oncology

## 2015-12-14 NOTE — Telephone Encounter (Signed)
returned call and lvm for pt confirming appt °

## 2015-12-16 ENCOUNTER — Other Ambulatory Visit (HOSPITAL_BASED_OUTPATIENT_CLINIC_OR_DEPARTMENT_OTHER): Payer: Managed Care, Other (non HMO)

## 2015-12-16 DIAGNOSIS — C9210 Chronic myeloid leukemia, BCR/ABL-positive, not having achieved remission: Secondary | ICD-10-CM | POA: Diagnosis not present

## 2015-12-16 LAB — CBC WITH DIFFERENTIAL/PLATELET
BASO%: 2 % (ref 0.0–2.0)
Basophils Absolute: 0.1 10*3/uL (ref 0.0–0.1)
EOS ABS: 0.4 10*3/uL (ref 0.0–0.5)
EOS%: 7.7 % — ABNORMAL HIGH (ref 0.0–7.0)
HCT: 40.3 % (ref 34.8–46.6)
HGB: 13.4 g/dL (ref 11.6–15.9)
LYMPH%: 36.5 % (ref 14.0–49.7)
MCH: 31.2 pg (ref 25.1–34.0)
MCHC: 33.3 g/dL (ref 31.5–36.0)
MCV: 93.6 fL (ref 79.5–101.0)
MONO#: 0.5 10*3/uL (ref 0.1–0.9)
MONO%: 8.4 % (ref 0.0–14.0)
NEUT#: 2.6 10*3/uL (ref 1.5–6.5)
NEUT%: 45.4 % (ref 38.4–76.8)
PLATELETS: 278 10*3/uL (ref 145–400)
RBC: 4.3 10*6/uL (ref 3.70–5.45)
RDW: 13.5 % (ref 11.2–14.5)
WBC: 5.7 10*3/uL (ref 3.9–10.3)
lymph#: 2.1 10*3/uL (ref 0.9–3.3)

## 2015-12-16 LAB — COMPREHENSIVE METABOLIC PANEL
ALT: 23 U/L (ref 0–55)
ANION GAP: 8 meq/L (ref 3–11)
AST: 19 U/L (ref 5–34)
Albumin: 3.8 g/dL (ref 3.5–5.0)
Alkaline Phosphatase: 48 U/L (ref 40–150)
BILIRUBIN TOTAL: 0.3 mg/dL (ref 0.20–1.20)
BUN: 18.7 mg/dL (ref 7.0–26.0)
CO2: 25 meq/L (ref 22–29)
Calcium: 9.5 mg/dL (ref 8.4–10.4)
Chloride: 105 mEq/L (ref 98–109)
Creatinine: 1 mg/dL (ref 0.6–1.1)
EGFR: 66 mL/min/{1.73_m2} — AB (ref >90)
GLUCOSE: 99 mg/dL (ref 70–140)
POTASSIUM: 4.4 meq/L (ref 3.5–5.1)
Sodium: 138 mEq/L (ref 136–145)
Total Protein: 7.1 g/dL (ref 6.4–8.3)

## 2015-12-16 MED FILL — LISINOPRIL 20 MG TABLET: 20 | 90 days supply | Qty: 90 | Fill #1

## 2015-12-23 ENCOUNTER — Telehealth: Payer: Self-pay | Admitting: Hematology and Oncology

## 2015-12-23 ENCOUNTER — Ambulatory Visit (HOSPITAL_BASED_OUTPATIENT_CLINIC_OR_DEPARTMENT_OTHER): Payer: Managed Care, Other (non HMO) | Admitting: Hematology and Oncology

## 2015-12-23 ENCOUNTER — Encounter: Payer: Self-pay | Admitting: Hematology and Oncology

## 2015-12-23 VITALS — BP 110/56 | HR 72 | Temp 98.0°F | Resp 18 | Ht 67.0 in | Wt 178.3 lb

## 2015-12-23 DIAGNOSIS — C9210 Chronic myeloid leukemia, BCR/ABL-positive, not having achieved remission: Secondary | ICD-10-CM

## 2015-12-23 DIAGNOSIS — J309 Allergic rhinitis, unspecified: Secondary | ICD-10-CM | POA: Diagnosis not present

## 2015-12-23 DIAGNOSIS — D721 Eosinophilia: Secondary | ICD-10-CM | POA: Diagnosis not present

## 2015-12-23 DIAGNOSIS — J3089 Other allergic rhinitis: Secondary | ICD-10-CM

## 2015-12-23 NOTE — Telephone Encounter (Signed)
Gave pt apt & avs °

## 2015-12-23 NOTE — Assessment & Plan Note (Addendum)
She has mild chronic eosinophilia and signs and symptoms of nasal congestion and mild skin rash. According to the patient, this is chronic in nature and I do not believe this is related to CML or Sprycel. Continue conservative management.

## 2015-12-23 NOTE — Assessment & Plan Note (Signed)
She tolerated treatment better with reduced dose Sprycel. Recent blood work showed that she has almost achieved molecular response. CBC has normalized. According to the milestone, she needs to get BCR/ABL international scale to less than 0.1% by October of this year. Despite reducing the dose of her Sprycel, the BCR/ABL by PCR continues to improve. At this point, I would not recommend adjusting her dose yet. I will see her back in 3 months to repeat blood work and BCR/ABL PCR. If repeat labs showed that BCR/ABL is still not under 0.1%, I will consider increasing the dose of Sprycel. She agreed with the plan of care.

## 2015-12-23 NOTE — Progress Notes (Signed)
[name] OFFICE PROGRESS NOTE  Patient Care Team: Burnis Medin, MD as PCP - General [name], MD (Endocrinology) [name], MD (Internal Medicine) Everett Graff, MD as Attending Physician (Obstetrics and Gynecology)  SUMMARY OF ONCOLOGIC HISTORY:   CML (chronic myeloid leukemia) ([place])   05/15/2015 Bone Marrow Biopsy BM biopsy and FISH confirmed CML [id number]   05/15/2015 - 05/20/2015 Chemotherapy She started taking Hydrea 1500 mg daily   05/15/2015 Tumor Marker BCR/ABL b2a2 87.5%, IS 49%   05/21/2015 -  Chemotherapy She started taking Sprycel 100 mg daily   06/29/2015 Adverse Reaction She had diarrhea. Dose of Srycel is reduced to 50 mg daily   09/10/2015 Tumor Marker BCR/ABL b2a2 0.21%, IS 0.1743%   12/11/2015 Pathology Results BCR/ABL b2a2 0.19%, IS 0.1577%    INTERVAL HISTORY: Please see below for problem oriented charting. She returns for further follow-up. She feels well apart from recent nasal congestion, hives and mild sore throat. She was prescribed oral antibiotic therapy recently. She tolerated Sprycel well without diarrhea of fluid retention.  REVIEW OF SYSTEMS:   Constitutional: Denies fevers, chills or abnormal weight loss Eyes: Denies blurriness of vision Ears, nose, mouth, throat, and face: Denies mucositis  Respiratory: Denies cough, dyspnea or wheezes Cardiovascular: Denies palpitation, chest discomfort or lower extremity swelling Gastrointestinal:  Denies nausea, heartburn or change in bowel habits Lymphatics: Denies new lymphadenopathy or easy bruising Neurological:Denies numbness, tingling or new weaknesses Behavioral/Psych: Mood is stable, no new changes  All other systems were reviewed with the patient and are negative.  I have reviewed the past medical history, past surgical history, social history and family history with the patient and they are unchanged from previous note.  ALLERGIES:  has No Known  Allergies.  MEDICATIONS:  Current Outpatient Prescriptions  Medication Sig Dispense Refill  . aspirin 81 MG tablet Take 81 mg by mouth daily.    . dasatinib (SPRYCEL) 50 MG tablet Take 1 tablet (50 mg total) by mouth daily. 90 tablet 3  . ibuprofen (ADVIL,MOTRIN) 200 MG tablet Take 200 mg by mouth every 6 (six) hours as needed.    Marland Kitchen lisinopril (PRINIVIL,ZESTRIL) 20 MG tablet Take 1 tablet (20 mg total) by mouth daily. 90 tablet 3  . SYNTHROID 150 MCG tablet Take 1 tablet (150 mcg total) by mouth daily before breakfast. 90 tablet 1  . verapamil (CALAN-SR) 240 MG CR tablet Take 1 tablet (240 mg total) by mouth at bedtime. 90 tablet 1  . zolpidem (AMBIEN) 10 MG tablet TAKE 1/2-1 TABLET BY MOUTH DAILY AT BEDTIME AS NEEDED 90 tablet 1   No current facility-administered medications for this visit.    PHYSICAL EXAMINATION: ECOG PERFORMANCE STATUS: 0 - Asymptomatic  Filed Vitals:   12/23/15 1104  BP: 110/56  Pulse: 72  Temp: 98 F (36.7 C)  Resp: 18   Filed Weights   12/23/15 1104  Weight: 178 lb 4.8 oz (80.876 kg)    GENERAL:alert, no distress and comfortable SKIN: skin color, texture, turgor are normal, no rashes or significant lesions. Mild hives noted on her arms EYES: normal, Conjunctiva are pink and non-injected, sclera clear OROPHARYNX:no exudate, no erythema and lips, buccal mucosa, and tongue normal  NECK: supple, thyroid normal size, non-tender, without nodularity LYMPH:  no palpable lymphadenopathy in the cervical, axillary or inguinal LUNGS: clear to auscultation and percussion with normal breathing effort. Reduced breath sounds in right lung base HEART: regular rate & rhythm and no murmurs and no lower extremity edema ABDOMEN:abdomen soft,  non-tender and normal bowel sounds Musculoskeletal:no cyanosis of digits and no clubbing  NEURO: alert & oriented x 3 with fluent speech, no focal motor/sensory deficits  LABORATORY DATA:  I have reviewed the data as listed     Component Value Date/Time   NA 138 12/16/2015 0928   NA 137 05/13/2015 1242   K 4.4 12/16/2015 0928   K 4.2 05/13/2015 1242   CL 103 05/13/2015 1242   CO2 25 12/16/2015 0928   CO2 26 05/13/2015 1242   GLUCOSE 99 12/16/2015 0928   GLUCOSE 80 05/13/2015 1242   BUN 18.7 12/16/2015 0928   BUN 15 05/13/2015 1242   CREATININE 1.0 12/16/2015 0928   CREATININE 0.71 05/13/2015 1242   CALCIUM 9.5 12/16/2015 0928   CALCIUM 9.7 05/13/2015 1242   PROT 7.1 12/16/2015 0928   PROT 7.2 05/13/2015 1242   ALBUMIN 3.8 12/16/2015 0928   ALBUMIN 4.1 05/13/2015 1242   AST 19 12/16/2015 0928   AST 20 05/13/2015 1242   ALT 23 12/16/2015 0928   ALT 20 05/13/2015 1242   ALKPHOS 48 12/16/2015 0928   ALKPHOS 52 05/13/2015 1242   BILITOT 0.30 12/16/2015 0928   BILITOT 0.3 05/13/2015 1242   GFRNONAA >90 08/08/2014 1528   GFRAA >90 08/08/2014 1528    No results found for: SPEP, UPEP  Lab Results  Component Value Date   WBC 5.7 12/16/2015   NEUTROABS 2.6 12/16/2015   HGB 13.4 12/16/2015   HCT 40.3 12/16/2015   MCV 93.6 12/16/2015   PLT 278 12/16/2015      Chemistry      Component Value Date/Time   NA 138 12/16/2015 0928   NA 137 05/13/2015 1242   K 4.4 12/16/2015 0928   K 4.2 05/13/2015 1242   CL 103 05/13/2015 1242   CO2 25 12/16/2015 0928   CO2 26 05/13/2015 1242   BUN 18.7 12/16/2015 0928   BUN 15 05/13/2015 1242   CREATININE 1.0 12/16/2015 0928   CREATININE 0.71 05/13/2015 1242      Component Value Date/Time   CALCIUM 9.5 12/16/2015 0928   CALCIUM 9.7 05/13/2015 1242   ALKPHOS 48 12/16/2015 0928   ALKPHOS 52 05/13/2015 1242   AST 19 12/16/2015 0928   AST 20 05/13/2015 1242   ALT 23 12/16/2015 0928   ALT 20 05/13/2015 1242   BILITOT 0.30 12/16/2015 0928   BILITOT 0.3 05/13/2015 1242      ASSESSMENT & PLAN:  CML (chronic myeloid leukemia) ([place]) She tolerated treatment better with reduced dose Sprycel. Recent blood work showed that she has almost achieved molecular  response. CBC has normalized. According to the milestone, she needs to get BCR/ABL international scale to less than 0.1% by October of this year. Despite reducing the dose of her Sprycel, the BCR/ABL by PCR continues to improve. At this point, I would not recommend adjusting her dose yet. I will see her back in 3 months to repeat blood work and BCR/ABL PCR. If repeat labs showed that BCR/ABL is still not under 0.1%, I will consider increasing the dose of Sprycel. She agreed with the plan of care.  Allergic rhinitis She has mild chronic eosinophilia and signs and symptoms of nasal congestion and mild skin rash. According to the patient, this is chronic in nature and I do not believe this is related to CML or Sprycel. Continue conservative management.   Orders Placed This Encounter  Procedures  . CBC with Differential/Platelet    Standing Status: Future  Number of Occurrences:      Standing Expiration Date: 01/26/2017  . BCR-ABL    With RT-PCR technique    Standing Status: Future     Number of Occurrences:      Standing Expiration Date: 01/26/2017   All questions were answered. The patient knows to call the clinic with any problems, questions or concerns. No barriers to learning was detected. I spent 15 minutes counseling the patient face to face. The total time spent in the appointment was 20 minutes and more than 50% was on counseling and review of test results     [facility], [place], MD 12/23/2015 12:17 PM

## 2015-12-31 DIAGNOSIS — H905 Unspecified sensorineural hearing loss: Secondary | ICD-10-CM | POA: Diagnosis not present

## 2015-12-31 DIAGNOSIS — Z01818 Encounter for other preprocedural examination: Secondary | ICD-10-CM | POA: Diagnosis not present

## 2015-12-31 DIAGNOSIS — H903 Sensorineural hearing loss, bilateral: Secondary | ICD-10-CM | POA: Diagnosis not present

## 2015-12-31 DIAGNOSIS — Z79899 Other long term (current) drug therapy: Secondary | ICD-10-CM | POA: Diagnosis not present

## 2016-01-06 MED FILL — SYNTHROID 150 MCG TABLET: 150 | 90 days supply | Qty: 90 | Fill #0

## 2016-01-12 ENCOUNTER — Other Ambulatory Visit: Payer: Self-pay | Admitting: *Deleted

## 2016-01-12 MED ORDER — DASATINIB 70 MG PO TABS
70.0000 mg | ORAL_TABLET | Freq: Every day | ORAL | Status: DC
Start: 2016-01-12 — End: 2016-04-21

## 2016-01-12 MED FILL — *SPRYCEL 70 MG TABLET: 70 | 90 days supply | Qty: 90 | Fill #0

## 2016-01-12 NOTE — Telephone Encounter (Signed)
Pt left VM states needs new Rx Sprycel 70 mg sent to [facility] outpatient pharmacy.  She says this is increase from 50 mg.

## 2016-02-01 ENCOUNTER — Other Ambulatory Visit: Payer: Self-pay | Admitting: Family Medicine

## 2016-02-01 MED ORDER — VERAPAMIL HCL ER 240 MG PO TBCR: 240.0000 mg | EXTENDED_RELEASE_TABLET | Freq: Every day | ORAL | Status: DC

## 2016-02-02 MED FILL — VERAPAMIL ER 240 MG TABLET: 240 | 90 days supply | Qty: 90 | Fill #0

## 2016-02-08 MED FILL — ZOLPIDEM TARTRATE 10 MG TAB: 10 | 90 days supply | Qty: 90 | Fill #1

## 2016-03-21 ENCOUNTER — Other Ambulatory Visit: Payer: Managed Care, Other (non HMO)

## 2016-03-22 MED FILL — LISINOPRIL 20 MG TABLET: 20 | 90 days supply | Qty: 90 | Fill #2

## 2016-03-29 ENCOUNTER — Telehealth: Payer: Self-pay | Admitting: Hematology and Oncology

## 2016-03-29 NOTE — Telephone Encounter (Signed)
Patient called to rschd appt. 03/29/16

## 2016-03-30 ENCOUNTER — Telehealth: Payer: Self-pay | Admitting: Hematology and Oncology

## 2016-03-30 ENCOUNTER — Ambulatory Visit: Payer: Managed Care, Other (non HMO) | Admitting: Hematology and Oncology

## 2016-03-30 NOTE — Telephone Encounter (Signed)
Patient called to rschd appt date. 03/30/16

## 2016-03-30 NOTE — Telephone Encounter (Signed)
APPT SCHD AND LTR MAILED PER PT REQUEST. 03/30/16

## 2016-04-08 MED FILL — SYNTHROID 150 MCG TABLET: 150 | 90 days supply | Qty: 90 | Fill #1

## 2016-04-13 ENCOUNTER — Other Ambulatory Visit (HOSPITAL_BASED_OUTPATIENT_CLINIC_OR_DEPARTMENT_OTHER): Payer: Managed Care, Other (non HMO)

## 2016-04-13 DIAGNOSIS — C9210 Chronic myeloid leukemia, BCR/ABL-positive, not having achieved remission: Secondary | ICD-10-CM | POA: Diagnosis not present

## 2016-04-13 LAB — COMPREHENSIVE METABOLIC PANEL
ALBUMIN: 3.8 g/dL (ref 3.5–5.0)
ALK PHOS: 58 U/L (ref 40–150)
ALT: 24 U/L (ref 0–55)
AST: 22 U/L (ref 5–34)
Anion Gap: 10 mEq/L (ref 3–11)
BUN: 14.5 mg/dL (ref 7.0–26.0)
CALCIUM: 9.6 mg/dL (ref 8.4–10.4)
CO2: 24 mEq/L (ref 22–29)
Chloride: 105 mEq/L (ref 98–109)
Creatinine: 0.9 mg/dL (ref 0.6–1.1)
EGFR: 69 mL/min/{1.73_m2} — AB (ref >90)
GLUCOSE: 85 mg/dL (ref 70–140)
POTASSIUM: 4.3 meq/L (ref 3.5–5.1)
SODIUM: 139 meq/L (ref 136–145)
TOTAL PROTEIN: 7.5 g/dL (ref 6.4–8.3)

## 2016-04-13 LAB — CBC WITH DIFFERENTIAL/PLATELET
BASO%: 1.4 % (ref 0.0–2.0)
Basophils Absolute: 0.1 10*3/uL (ref 0.0–0.1)
EOS%: 6.8 % (ref 0.0–7.0)
Eosinophils Absolute: 0.4 10*3/uL (ref 0.0–0.5)
HEMATOCRIT: 39.4 % (ref 34.8–46.6)
HGB: 13.5 g/dL (ref 11.6–15.9)
LYMPH#: 2.2 10*3/uL (ref 0.9–3.3)
LYMPH%: 37.5 % (ref 14.0–49.7)
MCH: 32.1 pg (ref 25.1–34.0)
MCHC: 34.2 g/dL (ref 31.5–36.0)
MCV: 94 fL (ref 79.5–101.0)
MONO#: 0.4 10*3/uL (ref 0.1–0.9)
MONO%: 7.2 % (ref 0.0–14.0)
NEUT%: 47.1 % (ref 38.4–76.8)
NEUTROS ABS: 2.7 10*3/uL (ref 1.5–6.5)
Platelets: 306 10*3/uL (ref 145–400)
RBC: 4.19 10*6/uL (ref 3.70–5.45)
RDW: 13.2 % (ref 11.2–14.5)
WBC: 5.8 10*3/uL (ref 3.9–10.3)

## 2016-04-14 ENCOUNTER — Other Ambulatory Visit: Payer: Managed Care, Other (non HMO)

## 2016-04-19 ENCOUNTER — Telehealth: Payer: Self-pay | Admitting: Family Medicine

## 2016-04-19 NOTE — Telephone Encounter (Signed)
Her BP has been going every afternoon this week to the [id number] range systolic and 88 to [id number] range diastolic. Her morning BP is stable around 124-134 over 80 range. Each afternoon she is getting headaches and feels hot or flushed. No chest pain or SOB. Then after taking the evening dose of Verapamil the BP settles down to normal range prior to bedtime. Her recent labs per Dr. Alvy Bimler are all normal. Should her meds be adjusted?

## 2016-04-19 NOTE — Telephone Encounter (Signed)
If continuing ok to  Increase the lisinopril to 40 mg  In am and stay on the verapamil  In pm  Let us know   How working after  1-2 weeks and then plan .

## 2016-04-19 NOTE — Telephone Encounter (Signed)
Dr. Sarajane Jews notified and will relay to the pt.  No refills needed at this time.

## 2016-04-21 ENCOUNTER — Ambulatory Visit (HOSPITAL_BASED_OUTPATIENT_CLINIC_OR_DEPARTMENT_OTHER): Payer: Managed Care, Other (non HMO) | Admitting: Hematology and Oncology

## 2016-04-21 ENCOUNTER — Other Ambulatory Visit: Payer: Self-pay | Admitting: *Deleted

## 2016-04-21 ENCOUNTER — Telehealth: Payer: Self-pay | Admitting: Hematology and Oncology

## 2016-04-21 VITALS — BP 123/67 | HR 74 | Temp 98.0°F | Resp 18 | Wt 178.1 lb

## 2016-04-21 DIAGNOSIS — C9210 Chronic myeloid leukemia, BCR/ABL-positive, not having achieved remission: Secondary | ICD-10-CM | POA: Diagnosis not present

## 2016-04-21 DIAGNOSIS — J986 Disorders of diaphragm: Secondary | ICD-10-CM

## 2016-04-21 MED ORDER — DASATINIB 100 MG PO TABS: 100.0000 mg | ORAL_TABLET | Freq: Every day | ORAL | 1 refills | Status: DC

## 2016-04-21 MED FILL — *SPRYCEL 100 MG TABLET: 100 | 90 days supply | Qty: 90 | Fill #0

## 2016-04-21 NOTE — Telephone Encounter (Signed)
Avs report and schedule given to patient, per 04/21/16 los. °

## 2016-04-21 NOTE — Assessment & Plan Note (Signed)
She tolerated treatment well with increased dose Sprycel. Recent blood work showed she has not achieved MMR. According to the milestone, she needs to get BCR/ABL international scale to less than 0.1% by October of this year. At this point, I would recommend adjusting her dose to 100 mg daily. I will see her back in 3 months to repeat blood work and BCR/ABL PCR.

## 2016-04-21 NOTE — Assessment & Plan Note (Signed)
She has chronic elevated right diaphragm. Examination show reduced breath sounds but she is not symptomatic. Observe only.

## 2016-04-21 NOTE — Progress Notes (Signed)
[place] OFFICE PROGRESS NOTE  Patient Care Team: Burnis Medin, MD as PCP - General [name], MD (Endocrinology) [name], MD (Internal Medicine) Everett Graff, MD as Attending Physician (Obstetrics and Gynecology)  SUMMARY OF ONCOLOGIC HISTORY:   CML (chronic myeloid leukemia) ([place])   05/15/2015 Bone Marrow Biopsy    BM biopsy and FISH confirmed CML [id number]      05/15/2015 - 05/20/2015 Chemotherapy    She started taking Hydrea 1500 mg daily      05/15/2015 Tumor Marker    BCR/ABL b2a2 87.5%, IS 49%      05/21/2015 -  Chemotherapy    She started taking Sprycel 100 mg daily      06/29/2015 Adverse Reaction    She had diarrhea. Dose of Srycel is reduced to 50 mg daily      09/10/2015 Tumor Marker    BCR/ABL b2a2 0.21%, IS 0.1743%      12/11/2015 Pathology Results    BCR/ABL b2a2 0.19%, IS 0.1577%      04/13/2016 Pathology Results    BCR/ABL b2a2 0.29%, IS 0.2407%       INTERVAL HISTORY: Please see below for problem oriented charting. She returns for follow-up. She tolerated increased dose well without major side effects. Denies diarrhea, shortness of breath or fluid retention. Denies recent infection  REVIEW OF SYSTEMS:   Constitutional: Denies fevers, chills or abnormal weight loss Eyes: Denies blurriness of vision Ears, nose, mouth, throat, and face: Denies mucositis or sore throat Respiratory: Denies cough, dyspnea or wheezes Cardiovascular: Denies palpitation, chest discomfort or lower extremity swelling Gastrointestinal:  Denies nausea, heartburn or change in bowel habits Skin: Denies abnormal skin rashes Lymphatics: Denies new lymphadenopathy or easy bruising Neurological:Denies numbness, tingling or new weaknesses Behavioral/Psych: Mood is stable, no new changes  All other systems were reviewed with the patient and are negative.  I have reviewed the past medical history, past surgical history, social history and family  history with the patient and they are unchanged from previous note.  ALLERGIES:  has No Known Allergies.  MEDICATIONS:  Current Outpatient Prescriptions  Medication Sig Dispense Refill  . aspirin 81 MG tablet Take 81 mg by mouth daily.    . dasatinib (SPRYCEL) 100 MG tablet Take 1 tablet (100 mg total) by mouth daily. 90 tablet 1  . ibuprofen (ADVIL,MOTRIN) 200 MG tablet Take 200 mg by mouth every 6 (six) hours as needed.    Marland Kitchen lisinopril (PRINIVIL,ZESTRIL) 20 MG tablet Take 1 tablet (20 mg total) by mouth daily. (Patient taking differently: Take 40 mg by mouth daily. ) 90 tablet 3  . SYNTHROID 150 MCG tablet Take 1 tablet (150 mcg total) by mouth daily before breakfast. 90 tablet 1  . verapamil (CALAN-SR) 240 MG CR tablet Take 1 tablet (240 mg total) by mouth at bedtime. 90 tablet 1  . zolpidem (AMBIEN) 10 MG tablet TAKE 1/2-1 TABLET BY MOUTH DAILY AT BEDTIME AS NEEDED 90 tablet 1   No current facility-administered medications for this visit.     PHYSICAL EXAMINATION: ECOG PERFORMANCE STATUS: 0 - Asymptomatic  Vitals:   04/21/16 0914  BP: 123/67  Pulse: 74  Resp: 18  Temp: 98 F (36.7 C)   Filed Weights   04/21/16 0914  Weight: 178 lb 1.6 oz (80.8 kg)    GENERAL:alert, no distress and comfortable SKIN: skin color, texture, turgor are normal, no rashes or significant lesions EYES: normal, Conjunctiva are pink and non-injected, sclera clear OROPHARYNX:no exudate, no erythema  and lips, buccal mucosa, and tongue normal  NECK: supple, thyroid normal size, non-tender, without nodularity LYMPH:  no palpable lymphadenopathy in the cervical, axillary or inguinal LUNGS: clear to auscultation and percussion with normal breathing effort. She has reduced breath sounds in the right lung base HEART: regular rate & rhythm and no murmurs and no lower extremity edema ABDOMEN:abdomen soft, non-tender and normal bowel sounds Musculoskeletal:no cyanosis of digits and no clubbing  NEURO: alert  & oriented x 3 with fluent speech, no focal motor/sensory deficits  LABORATORY DATA:  I have reviewed the data as listed    Component Value Date/Time   NA 139 04/13/2016 1043   K 4.3 04/13/2016 1043   CL 103 05/13/2015 1242   CO2 24 04/13/2016 1043   GLUCOSE 85 04/13/2016 1043   BUN 14.5 04/13/2016 1043   CREATININE 0.9 04/13/2016 1043   CALCIUM 9.6 04/13/2016 1043   PROT 7.5 04/13/2016 1043   ALBUMIN 3.8 04/13/2016 1043   AST 22 04/13/2016 1043   ALT 24 04/13/2016 1043   ALKPHOS 58 04/13/2016 1043   BILITOT <0.30 04/13/2016 1043   GFRNONAA >90 08/08/2014 1528   GFRAA >90 08/08/2014 1528    No results found for: SPEP, UPEP  Lab Results  Component Value Date   WBC 5.8 04/13/2016   NEUTROABS 2.7 04/13/2016   HGB 13.5 04/13/2016   HCT 39.4 04/13/2016   MCV 94.0 04/13/2016   PLT 306 04/13/2016      Chemistry      Component Value Date/Time   NA 139 04/13/2016 1043   K 4.3 04/13/2016 1043   CL 103 05/13/2015 1242   CO2 24 04/13/2016 1043   BUN 14.5 04/13/2016 1043   CREATININE 0.9 04/13/2016 1043      Component Value Date/Time   CALCIUM 9.6 04/13/2016 1043   ALKPHOS 58 04/13/2016 1043   AST 22 04/13/2016 1043   ALT 24 04/13/2016 1043   BILITOT <0.30 04/13/2016 1043       ASSESSMENT & PLAN:  CML (chronic myeloid leukemia) ([place]) She tolerated treatment well with increased dose Sprycel. Recent blood work showed she has not achieved MMR. According to the milestone, she needs to get BCR/ABL international scale to less than 0.1% by October of this year. At this point, I would recommend adjusting her dose to 100 mg daily. I will see her back in 3 months to repeat blood work and BCR/ABL PCR.  Elevated diaphragm  She has chronic elevated right diaphragm. Examination show reduced breath sounds but she is not symptomatic. Observe only.   Orders Placed This Encounter  Procedures  . CBC with Differential/Platelet    Standing Status:   Future    Standing  Expiration Date:   05/26/2017  . BCR-ABL    With RT-PCR technique    Standing Status:   Future    Standing Expiration Date:   05/26/2017   All questions were answered. The patient knows to call the clinic with any problems, questions or concerns. No barriers to learning was detected. I spent 15 minutes counseling the patient face to face. The total time spent in the appointment was 20 minutes and more than 50% was on counseling and review of test results     [facility], [place], MD 04/21/2016 1:48 PM

## 2016-04-28 MED FILL — VERAPAMIL ER 240 MG TABLET: 240 | 90 days supply | Qty: 90 | Fill #1

## 2016-05-02 ENCOUNTER — Telehealth: Payer: Self-pay | Admitting: Family Medicine

## 2016-05-02 MED ORDER — ZOLPIDEM TARTRATE 10 MG PO TABS: ORAL_TABLET | ORAL | 1 refills | Status: DC

## 2016-05-02 NOTE — Telephone Encounter (Signed)
Misty ok to refill 6 months  Last ov   . Will need OV before runs out.

## 2016-05-02 NOTE — Telephone Encounter (Signed)
Called to the pharmacy.

## 2016-05-02 NOTE — Telephone Encounter (Signed)
Please refill Zolpidem to [facility]

## 2016-05-06 MED FILL — ZOLPIDEM TARTRATE 10 MG TAB: 10 | 90 days supply | Qty: 90 | Fill #0

## 2016-05-20 ENCOUNTER — Encounter: Payer: Self-pay | Admitting: Internal Medicine

## 2016-05-20 ENCOUNTER — Ambulatory Visit (INDEPENDENT_AMBULATORY_CARE_PROVIDER_SITE_OTHER): Payer: Managed Care, Other (non HMO) | Admitting: Internal Medicine

## 2016-05-20 VITALS — BP 126/66 | HR 86 | Temp 98.3°F | Wt 179.0 lb

## 2016-05-20 DIAGNOSIS — C9210 Chronic myeloid leukemia, BCR/ABL-positive, not having achieved remission: Secondary | ICD-10-CM

## 2016-05-20 DIAGNOSIS — Z79899 Other long term (current) drug therapy: Secondary | ICD-10-CM

## 2016-05-20 DIAGNOSIS — J22 Unspecified acute lower respiratory infection: Secondary | ICD-10-CM

## 2016-05-20 MED ORDER — DOXYCYCLINE HYCLATE 100 MG PO TABS: 100.0000 mg | ORAL_TABLET | Freq: Two times a day (BID) | ORAL | 0 refills | Status: DC

## 2016-05-20 NOTE — Patient Instructions (Signed)
Your chest is good  Right now but if you get a fever .    coughing up infected looking  phlegm then begin antibiotic . Otherwise symptomatic rx and  Get rest.

## 2016-05-20 NOTE — Progress Notes (Signed)
Chief Complaint  Patient presents with  . Cough    Started Tuesday  . Headache  . Nasal Congestion    HPI: Christina [name] 55 y.o. comes in today with new acute illness that she thinks is a bad cold but her cough is getting worse and her husband is concerned about her condition. She is under treatment for cml   And intensification of rx recently  Almost remission  Cold sx  Cough and nasal congestion .  She thinks she got it from her daughter who is had first-year college recurrent respiratory illnesses. Her symptoms began with a runny stuffy nose For 3-4 days .      No fever but on   now has a cough that is nonproductive. She has no shortness of breath or unusual rashes. She gets hot flashes fatigue than a number of other side effects of the Sprycel. Taking mucinex  ROS: See pertinent positives and negatives per HPI.  Past Medical History:  Diagnosis Date  . Allergic rhinitis   . Fibroid   . Fibroids   . Grave's disease   . H/O Graves' disease   . H/O insomnia   . H/O menorrhagia 2012  . History of chicken pox   . Hypertension   . Hyperthyroidism    s/p rai 8/06 now hypothyroid Dr. Suzette Battiest  . Hypothyroid   . Infertility, female   . Menorrhagia    Had Novasure  . 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 Polyp of cervix    Endometriel polyp. JO  . Vitamin D deficiency   . Vitamin D deficiency     Family History  Problem Relation Age of Onset  . Diabetes Mother   . Hypertension    . Prostate cancer      grandfather  . Hypertension Father   . Cancer Maternal Uncle     leukemia    Social History   Social History  . Marital status: Married    Spouse name: N/A  . Number of children: N/A  . Years of education: N/A   Social History Main Topics  . Smoking status: Never Smoker  . Smokeless tobacco: Never Used  . Alcohol use Yes     Comment: socially  . Drug use: No  . Sexual activity: Yes   Other Topics Concern  . None   Social History Narrative   Married   HH of 4   Audiologist working at [employer]    Outpatient Medications Prior to Visit  Medication Sig Dispense Refill  . aspirin 81 MG tablet Take 81 mg by mouth daily.    . dasatinib (SPRYCEL) 100 MG tablet Take 1 tablet (100 mg total) by mouth daily. 90 tablet 1  . ibuprofen (ADVIL,MOTRIN) 200 MG tablet Take 200 mg by mouth every 6 (six) hours as needed.    Marland Kitchen lisinopril (PRINIVIL,ZESTRIL) 20 MG tablet Take 1 tablet (20 mg total) by mouth daily. (Patient taking differently: Take 40 mg by mouth daily. ) 90 tablet 3  . SYNTHROID 150 MCG tablet Take 1 tablet (150 mcg total) by mouth daily before breakfast. 90 tablet 1  . verapamil (CALAN-SR) 240 MG CR tablet Take 1 tablet (240 mg total) by mouth at bedtime. 90 tablet 1  . zolpidem (AMBIEN) 10 MG tablet TAKE 1/2-1 TABLET BY MOUTH DAILY AT BEDTIME AS NEEDED 90 tablet 1   No facility-administered medications prior to visit.      EXAM:  BP 126/66 (  BP Location: Right Arm, Patient Position: Sitting, Cuff Size: Normal)   Pulse 86   Temp 98.3 F (36.8 C) (Oral)   Wt 179 lb (81.2 kg)   SpO2 97%   BMI 28.04 kg/m   Body mass index is 28.04 kg/m. WDWN in NAD  quiet respirations; mildly congested  somewhat hoarse. Non toxic . HEENT: Normocephalic ;atraumatic , Eyes;  PERRL, EOMs  Full, lids and conjunctiva clear,,Ears: no deformities, canals nl, TM landmarks normal,( has hearing aids)  Nose: no deformity or discharge but congested;face minimally tender Mouth : OP clear without lesion or edema . Neck: Supple without adenopathy or masses or bruits Chest:  Clear to A&P without wheezes rales or rhonchi  Dec bs r ( has elevated hemidiaphragm)  CV:  S1-S2 no gallops or murmurs peripheral perfusion is normal Skin :nl perfusion and no acute rashes  MS: moves all extremities without noticeable focal  abnormality PSYCH: pleasant and cooperative, no obvious depression or anxiety Lab Results  Component Value Date   WBC 5.8  04/13/2016   HGB 13.5 04/13/2016   HCT 39.4 04/13/2016   PLT 306 04/13/2016   GLUCOSE 85 04/13/2016   CHOL 200 05/13/2015   TRIG 366.0 (H) 05/13/2015   HDL 43.60 05/13/2015   LDLDIRECT 119.0 05/13/2015   LDLCALC 123 (H) 05/17/2010   ALT 24 04/13/2016   AST 22 04/13/2016   NA 139 04/13/2016   K 4.3 04/13/2016   CL 103 05/13/2015   CREATININE 0.9 04/13/2016   BUN 14.5 04/13/2016   CO2 24 04/13/2016   TSH 1.61 09/18/2015    ASSESSMENT AND PLAN:  Discussed the following assessment and plan:  Acute respiratory infection - Appears to be viral uncomplicated but is high risk this is Friday expectant management doxycycline to add a fever or any other signs alarm contact us.  CML (chronic myeloid leukemia) ([place])  High risk medication use Close observation  Warranted   azithro risk ia with sprycell   Qt etc -Patient advised to return or notify health care team  if symptoms worsen ,persist or new concerns arise.  Patient Instructions  Your chest is good  Right now but if you get a fever .    coughing up infected looking  phlegm then begin antibiotic . Otherwise symptomatic rx and  Get rest.       Standley Brooking. Nadira Single M.D.

## 2016-05-20 NOTE — Progress Notes (Signed)
Pre visit review using our clinic review tool, if applicable. No additional management support is needed unless otherwise documented below in the visit note. 

## 2016-06-03 ENCOUNTER — Ambulatory Visit: Payer: Managed Care, Other (non HMO)

## 2016-06-12 ENCOUNTER — Ambulatory Visit (HOSPITAL_COMMUNITY)
Admission: EM | Admit: 2016-06-12 | Discharge: 2016-06-12 | Disposition: A | Discharge status: Home / Self Care | Payer: Managed Care, Other (non HMO) | Attending: Emergency Medicine | Admitting: Emergency Medicine

## 2016-06-12 ENCOUNTER — Encounter (HOSPITAL_COMMUNITY): Payer: Self-pay | Admitting: Emergency Medicine

## 2016-06-12 DIAGNOSIS — J029 Acute pharyngitis, unspecified: Secondary | ICD-10-CM | POA: Diagnosis not present

## 2016-06-12 DIAGNOSIS — H9202 Otalgia, left ear: Secondary | ICD-10-CM | POA: Diagnosis not present

## 2016-06-12 MED ORDER — AMOXICILLIN 875 MG PO TABS: 875.0000 mg | ORAL_TABLET | Freq: Two times a day (BID) | ORAL | 0 refills | Status: DC

## 2016-06-12 NOTE — ED Triage Notes (Signed)
Patient presents today to [facility] for Sore Throat and Left Ear Pain. Patient states she has been battling symptoms for over a week. She went through this same type of about 3 weeks ago and went away and has returned. Denies any fever but reports chills.

## 2016-06-12 NOTE — ED Provider Notes (Signed)
[place]    CSN: [id number] Arrival date & time: 06/12/16  1202     History   Chief Complaint Chief Complaint  Patient presents with  . Sore Throat  . Otalgia    HPI Christina [name] is a 55 y.o. female.   HPI  She is a 55 year old woman here for evaluation of left ear pain and sore throat. Her symptoms started about a month ago with cold symptoms including cough and nasal congestion. She saw her PCP at that time and was treated with a course of doxycycline. She states her symptoms did seem to get better, but then recurred.  Currently, her biggest concerns are left ear pain and sore throat. She denies any significant nasal symptoms or cough at this time. No fevers.  Past Medical History:  Diagnosis Date  . Allergic rhinitis   . Fibroid   . Fibroids   . Grave's disease   . H/O Graves' disease   . H/O insomnia   . H/O menorrhagia 2012  . History of chicken pox   . Hypertension   . Hyperthyroidism    s/p rai 8/06 now hypothyroid Dr. Suzette Battiest  . Hypothyroid   . Infertility, female   . Menorrhagia    Had Novasure  . 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 Polyp of cervix    Endometriel polyp. JO  . Vitamin D deficiency   . Vitamin D deficiency     Patient Active Problem List   Diagnosis Date Noted  . Pancytopenia due to antineoplastic chemotherapy ([place]) 06/30/2015  . Elevated diaphragm 06/30/2015  . DOE (dyspnea on exertion) 06/30/2015  . Diarrhea due to drug 06/30/2015  . CML (chronic myeloid leukemia) ([place]) 05/18/2015  . Encounter for chemotherapy management 05/18/2015  . 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 Hyperuricemia 05/15/2015  . Essential hypertension 11/27/2014  . Shortness of breath 12/03/2013  . Hx of Graves' disease 12/03/2013  . Elevated blood pressure reading 12/03/2013  . Special screening for malignant neoplasms, colon 08/09/2013  . Insomnia 04/29/2012  . Medication management 04/29/2012  . Foot pain 04/29/2012  . Polyp of  cervix   . Fibroids   . Vitamin D deficiency   . Cubital tunnel syndrome 09/05/2011  . TENDINITIS 12/19/2008  . HYPERTHYROIDISM 03/06/2007  . ARTHRALGIA 03/05/2007  . INSOMNIA 03/05/2007  . [place] DISEASE 03/02/2007  . LOSS, HEARING NOS 03/02/2007  . Allergic rhinitis 03/02/2007    Past Surgical History:  Procedure Laterality Date  . BUNIONECTOMY  1986  . DILATION AND CURETTAGE OF UTERUS  09/15/2010  . HYSTEROSCOPY W/D&C  09/28/2010  . MOUTH SURGERY    . NOVASURE ABLATION  09/28/2010  . REFRACTIVE SURGERY     for vision  . TONSILLECTOMY  1965    OB History    No data available       Home Medications    Prior to Admission medications   Medication Sig Start Date End Date Taking? Authorizing Provider  aspirin 81 MG tablet Take 81 mg by mouth daily.   Yes Historical Provider, MD  dasatinib (SPRYCEL) 100 MG tablet Take 1 tablet (100 mg total) by mouth daily. 04/21/16  Yes Heath Lark, MD  doxycycline (VIBRA-TABS) 100 MG tablet Take 1 tablet (100 mg total) by mouth 2 (two) times daily. 05/20/16  Yes Burnis Medin, MD  ibuprofen (ADVIL,MOTRIN) 200 MG tablet Take 200 mg by mouth every 6 (six) hours as needed.   Yes Historical Provider, MD  lisinopril (PRINIVIL,ZESTRIL) 20 MG tablet Take  1 tablet (20 mg total) by mouth daily. Patient taking differently: Take 40 mg by mouth daily.  09/16/15  Yes Burnis Medin, MD  SYNTHROID 150 MCG tablet Take 1 tablet (150 mcg total) by mouth daily before breakfast. 08/05/15  Yes Burnis Medin, MD  verapamil (CALAN-SR) 240 MG CR tablet Take 1 tablet (240 mg total) by mouth at bedtime. 02/01/16  Yes Burnis Medin, MD  zolpidem (AMBIEN) 10 MG tablet TAKE 1/2-1 TABLET BY MOUTH DAILY AT BEDTIME AS NEEDED 05/02/16  Yes Burnis Medin, MD  amoxicillin (AMOXIL) 875 MG tablet Take 1 tablet (875 mg total) by mouth 2 (two) times daily. 06/12/16   Melony Overly, MD    Family History Family History  Problem Relation Age of Onset  . Diabetes Mother   .  Hypertension    . Prostate cancer      grandfather  . Hypertension Father   . Cancer Maternal Uncle     leukemia    Social History Social History  Substance Use Topics  . Smoking status: Never Smoker  . Smokeless tobacco: Never Used  . Alcohol use Yes     Comment: socially     Allergies   Patient has no known allergies.   Review of Systems Review of Systems As in history of present illness  Physical Exam Triage Vital Signs ED Triage Vitals  Enc Vitals Group     BP 06/12/16 1229 123/84     Pulse Rate 06/12/16 1229 76     Resp 06/12/16 1229 16     Temp 06/12/16 1229 97.8 F (36.6 C)     Temp Source 06/12/16 1229 Oral     SpO2 06/12/16 1229 100 %     Weight --      Height --      Head Circumference --      Peak Flow --      Pain Score 06/12/16 1240 0     Pain Loc --      Pain Edu? --      Excl. in [place]? --    No data found.   Updated Vital Signs BP 123/84 (BP Location: Left Arm)   Pulse 76   Temp 97.8 F (36.6 C) (Oral)   Resp 16   SpO2 100%   Visual Acuity Right Eye Distance:   Left Eye Distance:   Bilateral Distance:    Right Eye Near:   Left Eye Near:    Bilateral Near:     Physical Exam  Constitutional: She is oriented to person, place, and time. She appears well-developed and well-nourished. No distress.  HENT:  Mouth/Throat: Oropharynx is clear and moist. No oropharyngeal exudate.  Right TM normal. Left TM is retracted with erythema.  Neck: Neck supple.  Cardiovascular: Normal rate, regular rhythm and normal heart sounds.   No murmur heard. Pulmonary/Chest: Effort normal and breath sounds normal. No respiratory distress. She has no wheezes. She has no rales.  Neurological: She is alert and oriented to person, place, and time.     UC Treatments / Results  Labs (all labs ordered are listed, but only abnormal results are displayed) Labs Reviewed - No data to display  EKG  EKG Interpretation None       Radiology No results  found.  Procedures Procedures (including critical care time)  Medications Ordered in UC Medications - No data to display   Initial Impression / Assessment and Plan / UC Course  I have reviewed  the triage vital signs and the nursing notes.  Pertinent labs & imaging results that were available during my care of the patient were reviewed by me and considered in my medical decision making (see chart for details).  Clinical Course     We'll treat with amoxicillin for 7 days for early ear infection. Follow-up as needed.  Final Clinical Impressions(s) / UC Diagnoses   Final diagnoses:  Sore throat  Left ear pain    New Prescriptions New Prescriptions   AMOXICILLIN (AMOXIL) 875 MG TABLET    Take 1 tablet (875 mg total) by mouth 2 (two) times daily.     Melony Overly, MD 06/12/16 [phone]

## 2016-06-12 NOTE — Discharge Instructions (Signed)
You may be having recurrent viral infections, but I'm concerned about early ear infection in your left ear. Take amoxicillin twice a day for 7 days. Make sure you are drinking plenty of fluids. Follow-up as needed.

## 2016-06-15 MED FILL — LISINOPRIL 20 MG TABLET: 20 | 90 days supply | Qty: 90 | Fill #3

## 2016-06-16 ENCOUNTER — Encounter: Payer: Self-pay | Admitting: Internal Medicine

## 2016-06-16 ENCOUNTER — Ambulatory Visit (INDEPENDENT_AMBULATORY_CARE_PROVIDER_SITE_OTHER): Payer: Managed Care, Other (non HMO) | Admitting: Internal Medicine

## 2016-06-16 ENCOUNTER — Encounter: Payer: Self-pay | Admitting: Family Medicine

## 2016-06-16 VITALS — BP 114/70 | Temp 98.1°F | Wt 181.0 lb

## 2016-06-16 DIAGNOSIS — C9210 Chronic myeloid leukemia, BCR/ABL-positive, not having achieved remission: Secondary | ICD-10-CM

## 2016-06-16 DIAGNOSIS — G4452 New daily persistent headache (NDPH): Secondary | ICD-10-CM

## 2016-06-16 DIAGNOSIS — Z79899 Other long term (current) drug therapy: Secondary | ICD-10-CM

## 2016-06-16 DIAGNOSIS — J069 Acute upper respiratory infection, unspecified: Secondary | ICD-10-CM

## 2016-06-16 DIAGNOSIS — I10 Essential (primary) hypertension: Secondary | ICD-10-CM | POA: Diagnosis not present

## 2016-06-16 LAB — CBC WITH DIFFERENTIAL/PLATELET
Basophils Absolute: 0 10*3/uL (ref 0.0–0.1)
Basophils Relative: 0.9 % (ref 0.0–3.0)
EOS PCT: 6.1 % — AB (ref 0.0–5.0)
Eosinophils Absolute: 0.3 10*3/uL (ref 0.0–0.7)
HCT: 34.3 % — ABNORMAL LOW (ref 36.0–46.0)
HEMOGLOBIN: 11.6 g/dL — AB (ref 12.0–15.0)
LYMPHS ABS: 1.8 10*3/uL (ref 0.7–4.0)
Lymphocytes Relative: 31.9 % (ref 12.0–46.0)
MCHC: 33.8 g/dL (ref 30.0–36.0)
MCV: 92.4 fl (ref 78.0–100.0)
MONOS PCT: 9.2 % (ref 3.0–12.0)
Monocytes Absolute: 0.5 10*3/uL (ref 0.1–1.0)
NEUTROS PCT: 51.9 % (ref 43.0–77.0)
Neutro Abs: 2.9 10*3/uL (ref 1.4–7.7)
Platelets: 308 10*3/uL (ref 150.0–400.0)
RBC: 3.71 Mil/uL — AB (ref 3.87–5.11)
RDW: 13.9 % (ref 11.5–15.5)
WBC: 5.7 10*3/uL (ref 4.0–10.5)

## 2016-06-16 LAB — BASIC METABOLIC PANEL
BUN: 18 mg/dL (ref 6–23)
CALCIUM: 9.2 mg/dL (ref 8.4–10.5)
CO2: 28 meq/L (ref 19–32)
CREATININE: 0.85 mg/dL (ref 0.40–1.20)
Chloride: 105 mEq/L (ref 96–112)
GFR: 73.55 mL/min (ref >60.00)
GLUCOSE: 83 mg/dL (ref 70–99)
Potassium: 4.1 mEq/L (ref 3.5–5.1)
Sodium: 138 mEq/L (ref 135–145)

## 2016-06-16 NOTE — Patient Instructions (Addendum)
  Am concerned about  .  Your headache also   If unusual  sinus infection   But you are on  Anti biotic  .already . I suggest we get a sinus ct scan and head    To see if changing antibiotic will help you.   Blood work and then go from there.

## 2016-06-16 NOTE — Progress Notes (Signed)
Pre visit review using our clinic review tool, if applicable. No additional management support is needed unless otherwise documented below in the visit note.  Chief Complaint  Patient presents with  . Cough    X 5-6 wks  . Headache  . Sinusitis  . Ear Pain    HPI: Christina [name] 55 y.o.  sda  For ongoing ur sx  problem for the past month or so    Seen   10 13  Resp covered for atypical wth doxy  Was improving and then got st and  Ear pain    Then seen [facility]    And mild  Ear sx  And given amox bid  On 11 5   Taking bid ear Getting better but has ongoing     Pressure headache   taking  meds for this .  Has taken up to 7-8 ibuprofen doses a day.    The headache has been there since the original illness. It is worse with coughing sometimes bending over can wake her up at night laying down helps some. Her blood pressure is in control. Her ear feels better on the left. No unusual fever and swelling just doesn't feel well headache.   To get lab  In december  Per  heme ROS: See pertinent positives and negatives per HPI. No cp sob syncope numbness focal neuro  Vision changes  No vomiting falling   Past Medical History:  Diagnosis Date  . Allergic rhinitis   . Fibroid   . Fibroids   . Grave's disease   . H/O Graves' disease   . H/O insomnia   . H/O menorrhagia 2012  . History of chicken pox   . Hypertension   . Hyperthyroidism    s/p rai 8/06 now hypothyroid Dr. Suzette Battiest  . Hypothyroid   . Infertility, female   . Menorrhagia    Had Novasure  . 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 Polyp of cervix    Endometriel polyp. JO  . Vitamin D deficiency   . Vitamin D deficiency     Family History  Problem Relation Age of Onset  . Diabetes Mother   . Hypertension    . Prostate cancer      grandfather  . Hypertension Father   . Cancer Maternal Uncle     leukemia    Social History   Social History  . Marital status: Married    Spouse name: N/A  . Number of  children: N/A  . Years of education: N/A   Social History Main Topics  . Smoking status: Never Smoker  . Smokeless tobacco: Never Used  . Alcohol use Yes     Comment: socially  . Drug use: No  . Sexual activity: Yes   Other Topics Concern  . None   Social History Narrative   Married   HH of 4   Audiologist working at [employer]    Outpatient Medications Prior to Visit  Medication Sig Dispense Refill  . amoxicillin (AMOXIL) 875 MG tablet Take 1 tablet (875 mg total) by mouth 2 (two) times daily. 14 tablet 0  . aspirin 81 MG tablet Take 81 mg by mouth daily.    . dasatinib (SPRYCEL) 100 MG tablet Take 1 tablet (100 mg total) by mouth daily. 90 tablet 1  . ibuprofen (ADVIL,MOTRIN) 200 MG tablet Take 200 mg by mouth every 6 (six) hours as needed.    Marland Kitchen  lisinopril (PRINIVIL,ZESTRIL) 20 MG tablet Take 1 tablet (20 mg total) by mouth daily. (Patient taking differently: Take 40 mg by mouth daily. ) 90 tablet 3  . SYNTHROID 150 MCG tablet Take 1 tablet (150 mcg total) by mouth daily before breakfast. 90 tablet 1  . verapamil (CALAN-SR) 240 MG CR tablet Take 1 tablet (240 mg total) by mouth at bedtime. 90 tablet 1  . zolpidem (AMBIEN) 10 MG tablet TAKE 1/2-1 TABLET BY MOUTH DAILY AT BEDTIME AS NEEDED 90 tablet 1  . doxycycline (VIBRA-TABS) 100 MG tablet Take 1 tablet (100 mg total) by mouth 2 (two) times daily. 14 tablet 0   No facility-administered medications prior to visit.      EXAM:  BP 114/70 (BP Location: Left Arm, Patient Position: Sitting, Cuff Size: Normal)   Temp 98.1 F (36.7 C) (Oral)   Wt 181 lb (82.1 kg)   BMI 28.35 kg/m   Body mass index is 28.35 kg/m.  GENERAL: vitals reviewed and listed above, alert, oriented, appears well hydrated and in no acute distress looks tires  In nad  HEENT: atraumatic, conjunctiva  clear, no obvious abnormalities on inspection of external nose and ears tm intact mild retraction left  No fluid  OP : no lesion edema or exudate  ? If pnd maxilla  non tender some ? puffiness  Poss bifrontal  Tenderness  NECK: no obvious masses on inspection palpation shoddy ? pc nodes  LUNGS: clear to auscultation bilaterally, no wheezes, rales or rhonchi, good air movement CV: HRRR, no clubbing cyanosis or  peripheral edema nl cap refill  MS: moves all extremities without noticeable focal  abnormality Neuro grossly non focal  exc dec hearing   ASSESSMENT AND PLAN:  Discussed the following assessment and plan:  New daily persistent headache - Plan: Epstein-Barr Virus VCA Antibody Panel, CBC with Differential/Platelet, Basic metabolic panel, CT HEAD WO CONTRAST, CT MAXILLOFACIAL LIMITED WO CONTRAST  Protracted URI - Plan: Epstein-Barr Virus VCA Antibody Panel, CBC with Differential/Platelet, Basic metabolic panel, CT HEAD WO CONTRAST, CT MAXILLOFACIAL LIMITED WO CONTRAST  Essential hypertension - Plan: Basic metabolic panel  High risk medication use - Plan: CT HEAD WO CONTRAST, CT MAXILLOFACIAL LIMITED WO CONTRAST  CML (chronic myelocytic leukemia) (HCC) - Plan: CT HEAD WO CONTRAST, CT MAXILLOFACIAL LIMITED WO CONTRAST Sx for a month?  2 antibiotics with varying  Success  ? Two rep infection vs relapsing  With mild sx except of unrelenting headache  With this illness  Consider frontal spenoid  sinusitis?     high risk  meds and remitted  underlying  cml  -Patient advised to return or notify health care team  if symptoms worsen ,persist or new concerns arise. Until plan made  appt made for tomorrow am   dsic w patient  Patient Instructions   Am concerned about  .  Your headache also   If unusual  sinus infection   But you are on  Anti biotic  .already . I suggest we get a sinus ct scan and head    To see if changing antibiotic will help you.   Blood work and then go from there.     Standley Brooking. Panosh M.D.

## 2016-06-17 ENCOUNTER — Emergency Department (HOSPITAL_COMMUNITY): Payer: Managed Care, Other (non HMO) | Admitting: Anesthesiology

## 2016-06-17 ENCOUNTER — Inpatient Hospital Stay (HOSPITAL_COMMUNITY)
Admission: EM | Admit: 2016-06-17 | Discharge: 2016-06-21 | DRG: 026 | Disposition: A | Discharge status: Home / Self Care | Payer: Managed Care, Other (non HMO) | Attending: Neurological Surgery | Admitting: Neurological Surgery

## 2016-06-17 ENCOUNTER — Ambulatory Visit (INDEPENDENT_AMBULATORY_CARE_PROVIDER_SITE_OTHER)
Admission: RE | Admit: 2016-06-17 | Discharge: 2016-06-17 | Disposition: A | Discharge status: Home / Self Care | Payer: Managed Care, Other (non HMO) | Source: Ambulatory Visit | Attending: Internal Medicine | Admitting: Internal Medicine

## 2016-06-17 ENCOUNTER — Encounter (HOSPITAL_COMMUNITY): Payer: Self-pay

## 2016-06-17 ENCOUNTER — Encounter (HOSPITAL_COMMUNITY)
Admission: EM | Disposition: A | Discharge status: Home / Self Care | Payer: Self-pay | Source: Home / Self Care | Attending: Neurological Surgery

## 2016-06-17 ENCOUNTER — Emergency Department (HOSPITAL_COMMUNITY): Payer: Managed Care, Other (non HMO)

## 2016-06-17 DIAGNOSIS — S065X9A Traumatic subdural hemorrhage with loss of consciousness of unspecified duration, initial encounter: Secondary | ICD-10-CM

## 2016-06-17 DIAGNOSIS — I10 Essential (primary) hypertension: Secondary | ICD-10-CM | POA: Diagnosis present

## 2016-06-17 DIAGNOSIS — Z7982 Long term (current) use of aspirin: Secondary | ICD-10-CM | POA: Diagnosis not present

## 2016-06-17 DIAGNOSIS — I6200 Nontraumatic subdural hemorrhage, unspecified: Secondary | ICD-10-CM | POA: Diagnosis not present

## 2016-06-17 DIAGNOSIS — R402142 Coma scale, eyes open, spontaneous, at arrival to emergency department: Secondary | ICD-10-CM | POA: Diagnosis present

## 2016-06-17 DIAGNOSIS — C9210 Chronic myeloid leukemia, BCR/ABL-positive, not having achieved remission: Secondary | ICD-10-CM | POA: Diagnosis not present

## 2016-06-17 DIAGNOSIS — Z79899 Other long term (current) drug therapy: Secondary | ICD-10-CM

## 2016-06-17 DIAGNOSIS — I6203 Nontraumatic chronic subdural hemorrhage: Secondary | ICD-10-CM | POA: Diagnosis not present

## 2016-06-17 DIAGNOSIS — S065XAA Traumatic subdural hemorrhage with loss of consciousness status unknown, initial encounter: Secondary | ICD-10-CM

## 2016-06-17 DIAGNOSIS — G4452 New daily persistent headache (NDPH): Secondary | ICD-10-CM

## 2016-06-17 DIAGNOSIS — E039 Hypothyroidism, unspecified: Secondary | ICD-10-CM | POA: Diagnosis present

## 2016-06-17 DIAGNOSIS — R030 Elevated blood-pressure reading, without diagnosis of hypertension: Secondary | ICD-10-CM

## 2016-06-17 DIAGNOSIS — J069 Acute upper respiratory infection, unspecified: Secondary | ICD-10-CM

## 2016-06-17 DIAGNOSIS — I6201 Nontraumatic acute subdural hemorrhage: Secondary | ICD-10-CM | POA: Diagnosis not present

## 2016-06-17 DIAGNOSIS — R402362 Coma scale, best motor response, obeys commands, at arrival to emergency department: Secondary | ICD-10-CM | POA: Diagnosis present

## 2016-06-17 DIAGNOSIS — R402252 Coma scale, best verbal response, oriented, at arrival to emergency department: Secondary | ICD-10-CM | POA: Diagnosis present

## 2016-06-17 DIAGNOSIS — R51 Headache: Secondary | ICD-10-CM | POA: Diagnosis not present

## 2016-06-17 DIAGNOSIS — J9811 Atelectasis: Secondary | ICD-10-CM | POA: Diagnosis not present

## 2016-06-17 DIAGNOSIS — Z9889 Other specified postprocedural states: Secondary | ICD-10-CM

## 2016-06-17 HISTORY — DX: Leukemia, unspecified not having achieved remission: C95.90

## 2016-06-17 HISTORY — PX: CRANIOTOMY: SHX93

## 2016-06-17 LAB — CBC WITH DIFFERENTIAL/PLATELET
BASOS ABS: 0 10*3/uL (ref 0.0–0.1)
BASOS PCT: 0 %
EOS ABS: 0 10*3/uL (ref 0.0–0.7)
Eosinophils Relative: 0 %
HCT: 33.1 % — ABNORMAL LOW (ref 36.0–46.0)
HEMOGLOBIN: 11.1 g/dL — AB (ref 12.0–15.0)
Lymphocytes Relative: 23 %
Lymphs Abs: 1.5 10*3/uL (ref 0.7–4.0)
MCH: 30.9 pg (ref 26.0–34.0)
MCHC: 33.5 g/dL (ref 30.0–36.0)
MCV: 92.2 fL (ref 78.0–100.0)
Monocytes Absolute: 0.4 10*3/uL (ref 0.1–1.0)
Monocytes Relative: 6 %
NEUTROS PCT: 71 %
Neutro Abs: 4.8 10*3/uL (ref 1.7–7.7)
Platelets: 236 10*3/uL (ref 150–400)
RBC: 3.59 MIL/uL — AB (ref 3.87–5.11)
RDW: 13.7 % (ref 11.5–15.5)
WBC: 6.7 10*3/uL (ref 4.0–10.5)

## 2016-06-17 LAB — COMPREHENSIVE METABOLIC PANEL
ALBUMIN: 3.8 g/dL (ref 3.5–5.0)
ALK PHOS: 44 U/L (ref 38–126)
ALT: 28 U/L (ref 14–54)
ANION GAP: 8 (ref 5–15)
AST: 26 U/L (ref 15–41)
BUN: 15 mg/dL (ref 6–20)
CALCIUM: 9.1 mg/dL (ref 8.9–10.3)
CO2: 23 mmol/L (ref 22–32)
Chloride: 106 mmol/L (ref 101–111)
Creatinine, Ser: 0.8 mg/dL (ref 0.44–1.00)
GFR calc Af Amer: >60 mL/min (ref >60)
GFR calc non Af Amer: >60 mL/min (ref >60)
GLUCOSE: 104 mg/dL — AB (ref 65–99)
Potassium: 4 mmol/L (ref 3.5–5.1)
SODIUM: 137 mmol/L (ref 135–145)
Total Bilirubin: 0.2 mg/dL — ABNORMAL LOW (ref 0.3–1.2)
Total Protein: 6.6 g/dL (ref 6.5–8.1)

## 2016-06-17 LAB — EPSTEIN-BARR VIRUS VCA ANTIBODY PANEL
EBV NA IgG: 170 U/mL — ABNORMAL HIGH
EBV VCA IgG: >750 U/mL — ABNORMAL HIGH
EBV VCA IgM: <36 U/mL

## 2016-06-17 LAB — APTT: APTT: 32 s (ref 24–36)

## 2016-06-17 LAB — PROTIME-INR
INR: 0.9
Prothrombin Time: 12.2 seconds (ref 11.4–15.2)

## 2016-06-17 LAB — MRSA PCR SCREENING: MRSA by PCR: NEGATIVE

## 2016-06-17 IMAGING — CR DG CHEST 2V
3 series · 3 of 3 positions shown · non-contrast
Comparison: [DATE].

CLINICAL DATA: Preoperative chest x-ray .

EXAM:
CHEST  2 VIEW

[chest lat (1 of 2)]
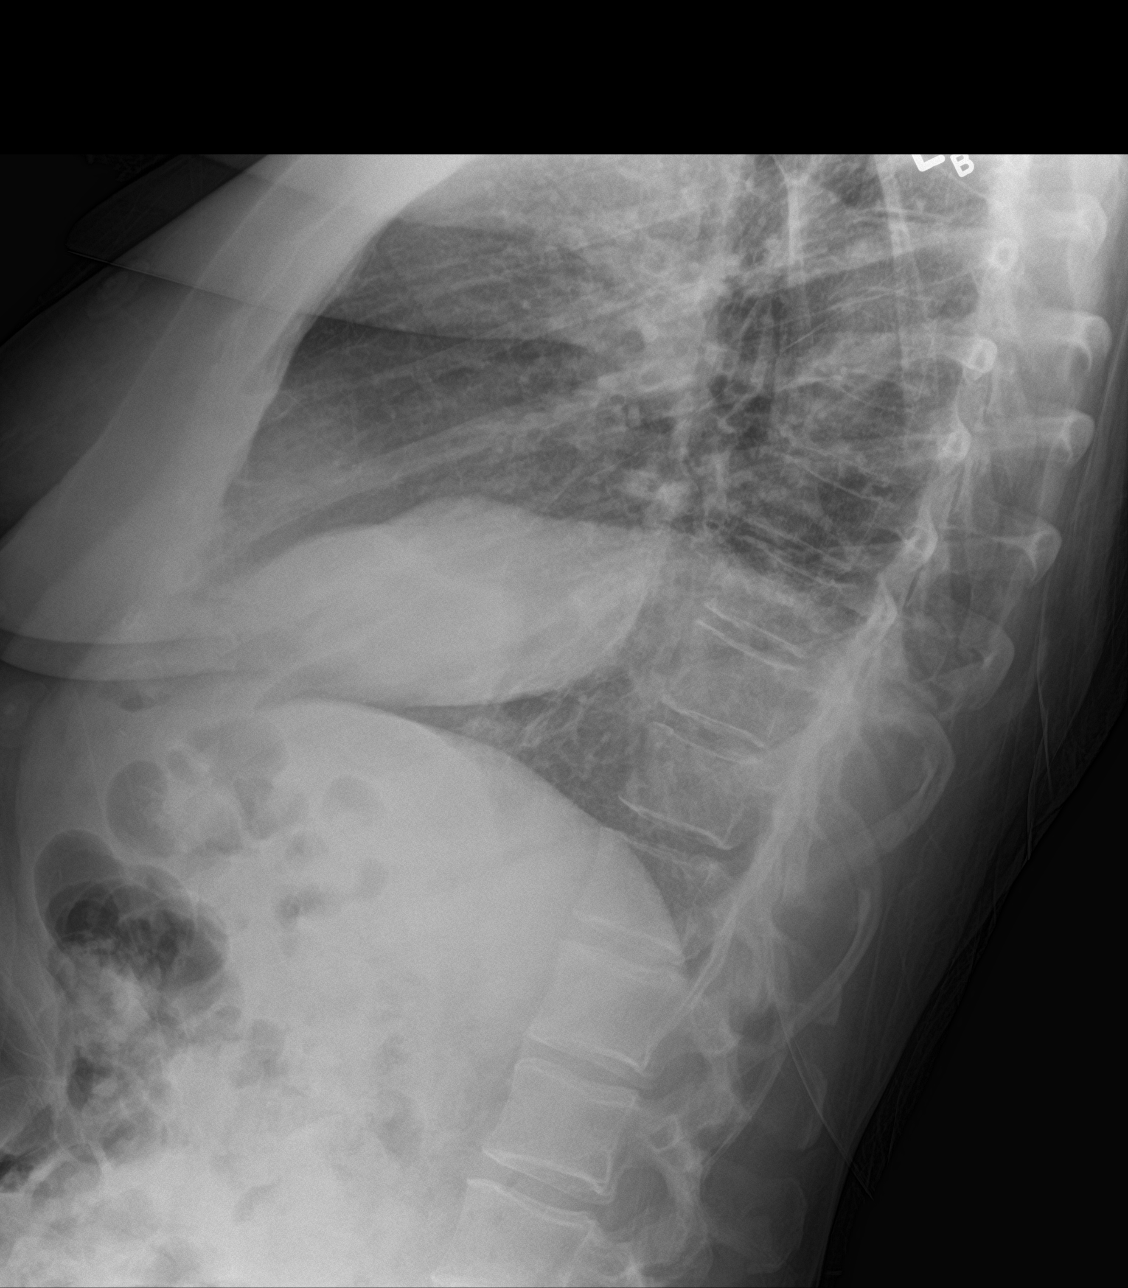

[chest ap]
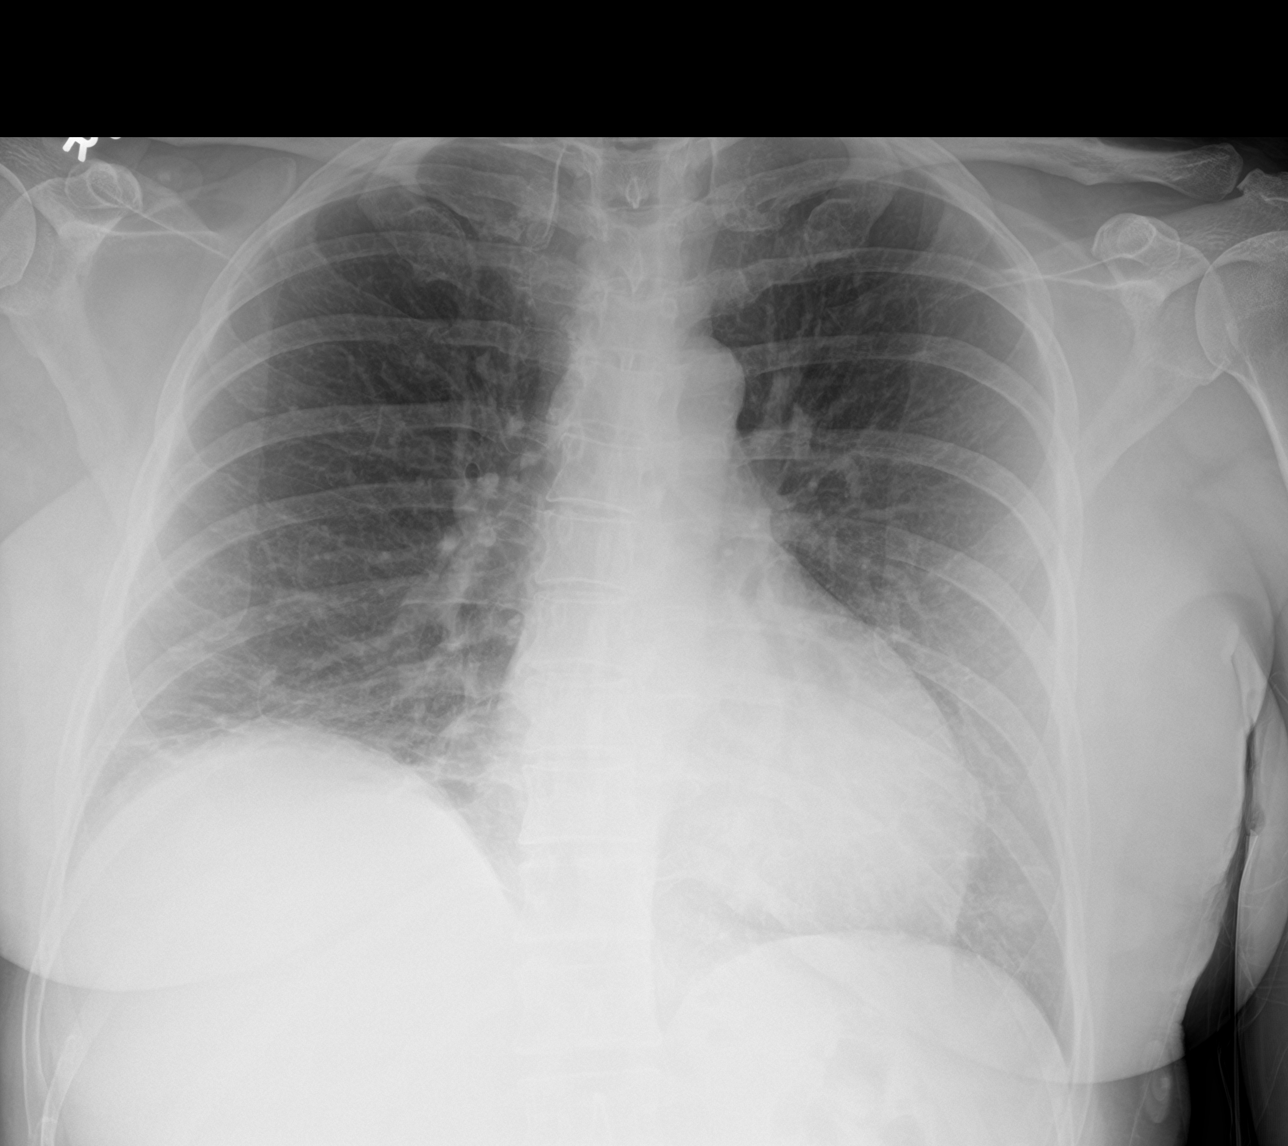

[chest lat (2 of 2)]
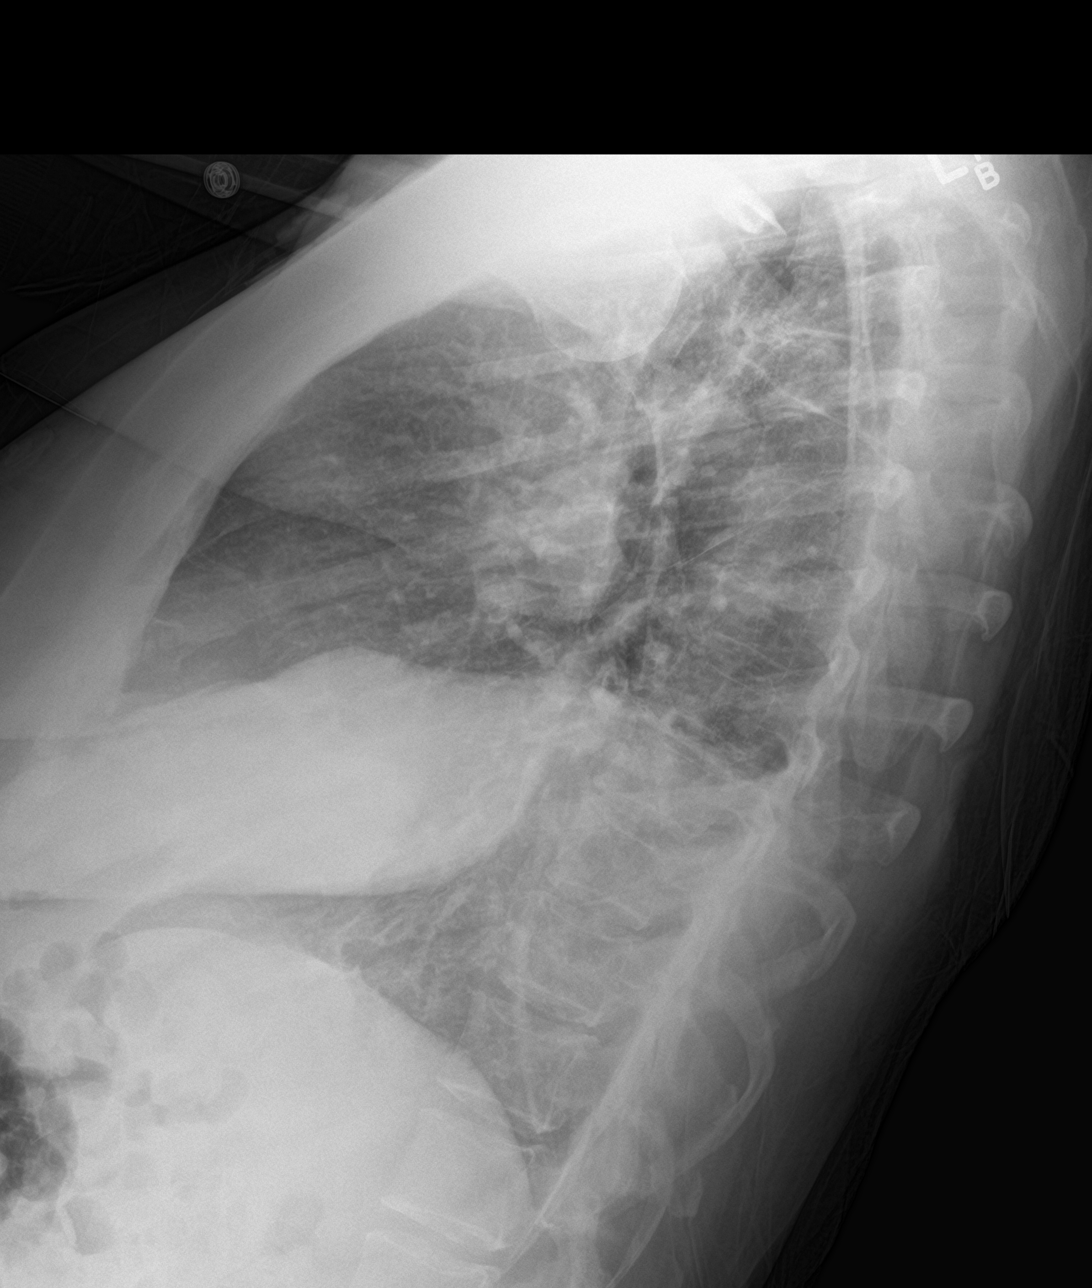

[3 of 3 positions shown; findings below may reference images not displayed]

FINDINGS: Mediastinum and hilar structures are normal. Mild bibasilar
atelectasis, right side greater than left. Cardiomegaly with normal
pulmonary vascularity. No pleural effusion or pneumothorax. Stable
elevation right hemidiaphragm.
IMPRESSION: 1. Mild bibasilar atelectasis, right side greater than left. Stable
elevation right hemidiaphragm.

2. Cardiomegaly.  No pulmonary venous congestion.

## 2016-06-17 IMAGING — CT CT PARANASAL SINUSES LIMITED
1 of 2 series · 7 of 10 positions shown, 9 images · non-contrast
Comparison: CT brain scan of [DATE]

CLINICAL DATA: Facial pain and pressure for several weeks,
persistent headache

EXAM:
CT PARANASAL SINUS LIMITED WITHOUT CONTRAST
TECHNIQUE: Non-contiguous multidetector CT images of the paranasal sinuses were
obtained in a single plane without contrast.

[Series 4: limited sinus st · axial · 0.35mm/px · z∈[+21,+81]mm · 7 of 9 slices shown, 9 images]
[im 2/9  brain]
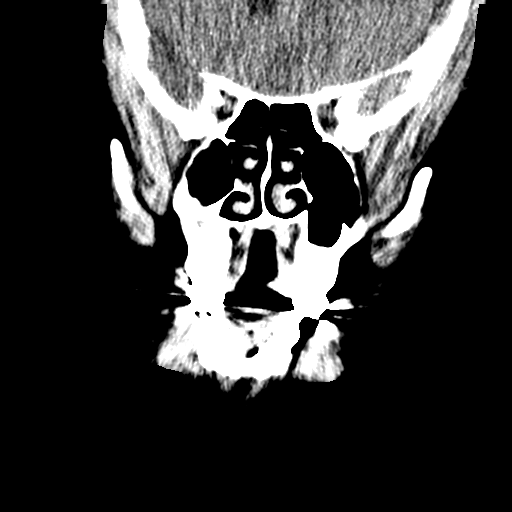
[im 2/9  bone]
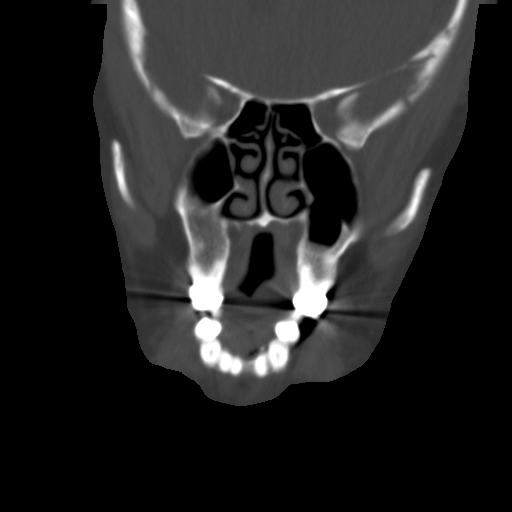
[im 3/9  bone]
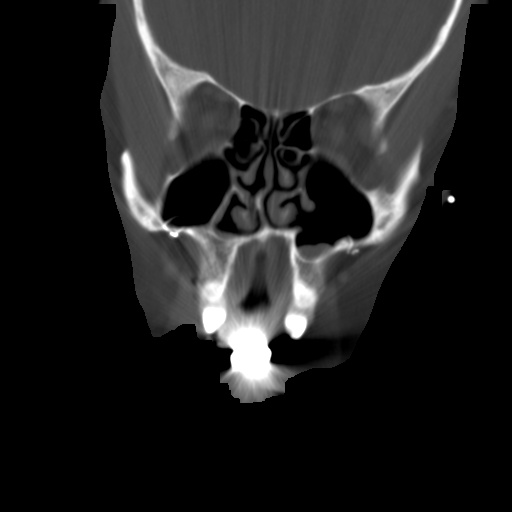
[im 4/9  bone]
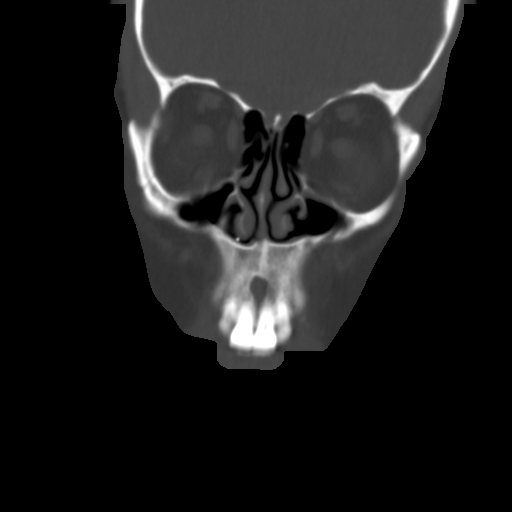
[im 5/9  bone]
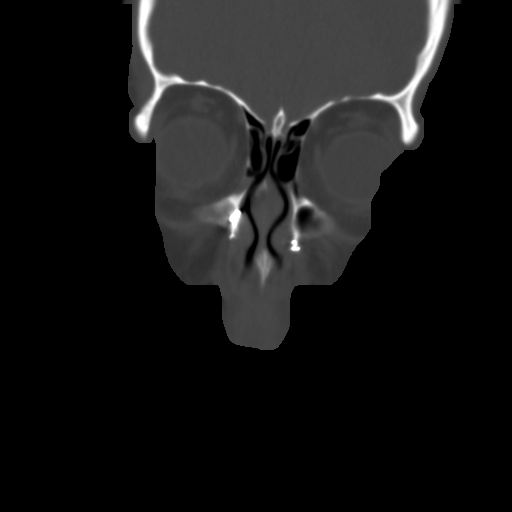
[im 6/9  brain]
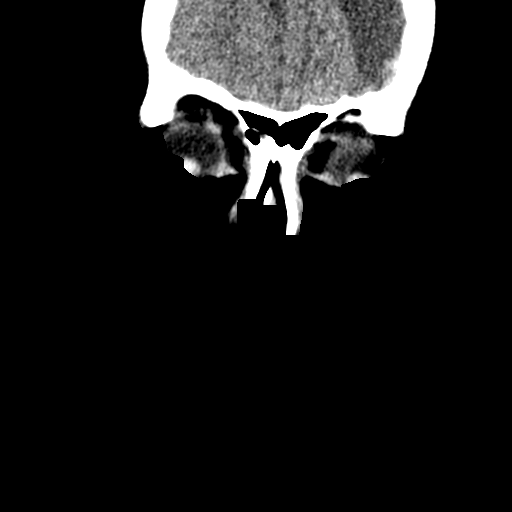
[im 6/9  bone]
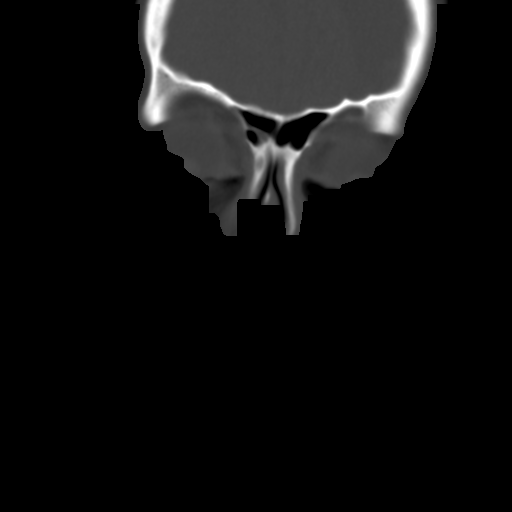
[im 7/9  bone]
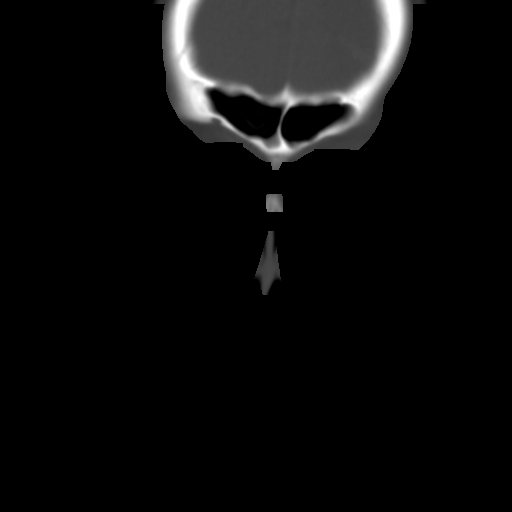
[im 8/9  bone]
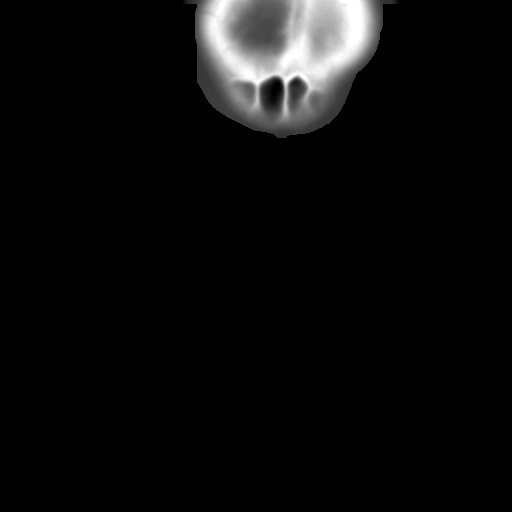

[7 of 10 positions shown; findings below may reference images not displayed]

FINDINGS: No definite sinusitis is seen. Only mild mucosal thickening is
present in the floor of the right maxillary sinus. Sutures are noted
in the floor both maxillary sinuses from prior surgical
intervention. The medial wall of the maxillary sinus appears to be
absent, and the patient may have undergone medial antrostomy on the
right. The nasal turbinates are normal in size and position and the
nasal airway is patent. No bony abnormality is seen. No soft tissue
abnormality is evident.
IMPRESSION: 1. No definite sinusitis. Mild mucosal thickening in the floor of
the right maxillary sinus.
2. Question of prior right medial antrostomy with apparent absence
of the medial wall of the right maxillary sinus.

## 2016-06-17 IMAGING — CT CT HEAD W/O CM
3 series · 14 of 47 positions shown, 16 images · non-contrast
Comparison: CT orbit of [DATE]

CLINICAL DATA: Persistent headache, fever

EXAM:
CT HEAD WITHOUT CONTRAST
TECHNIQUE: Contiguous axial images were obtained from the base of the skull
through the vertex without intravenous contrast.

[Series 2: head 5.0 h37s · axial · 0.45mm/px · z∈[-94,+41]mm · 8 of 33 slices shown, 10 images]
[im 3/33  brain]
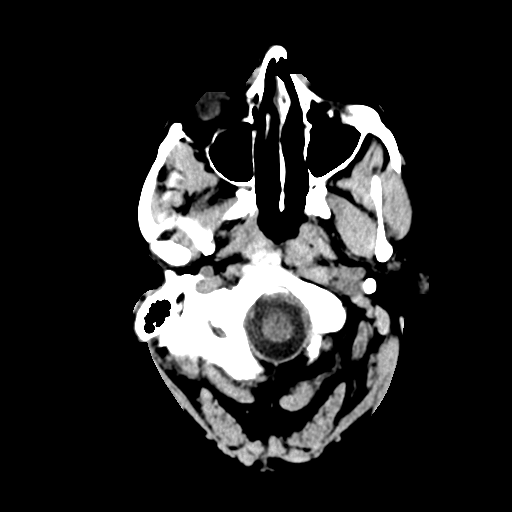
[im 3/33  bone]
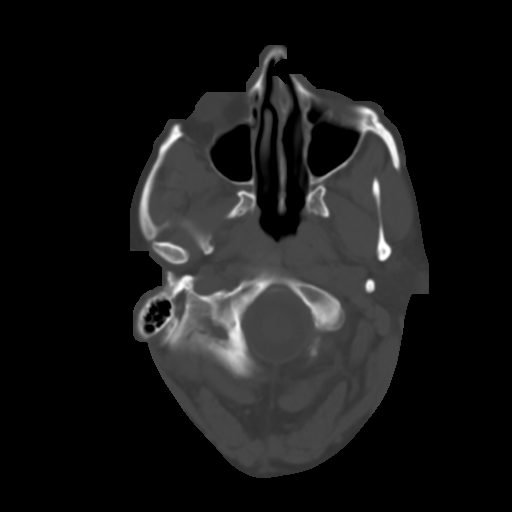
[im 7/33  brain]
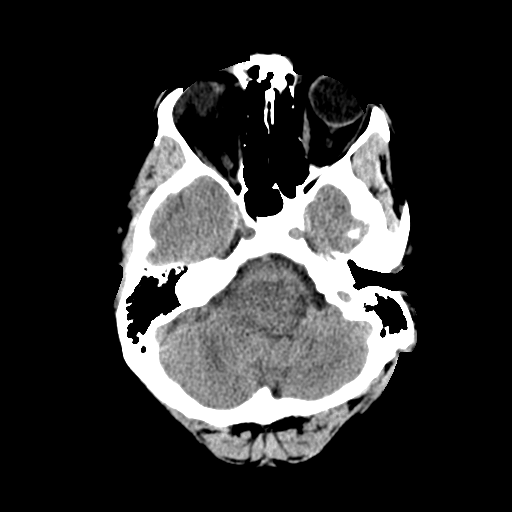
[im 10/33  brain]
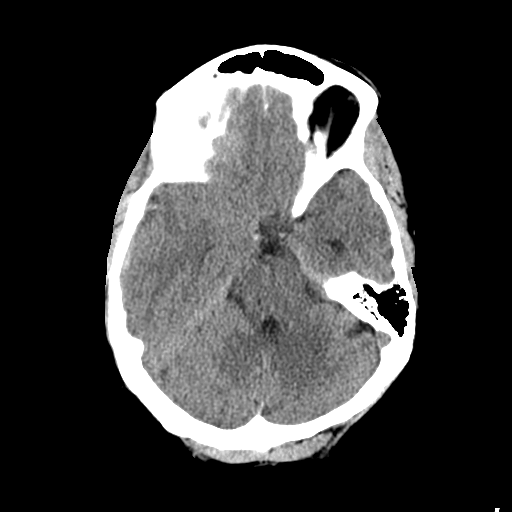
[im 15/33  brain]
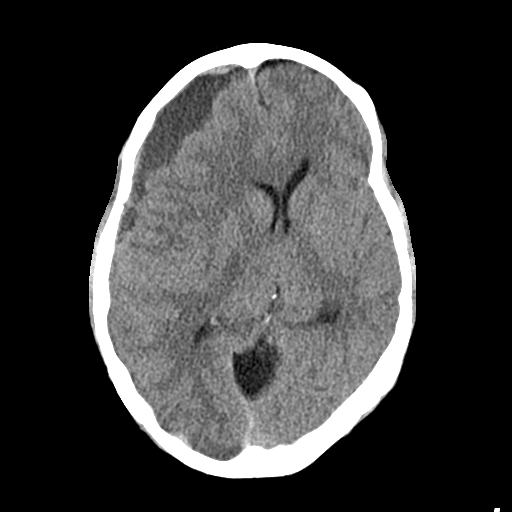
[im 18/33  brain]
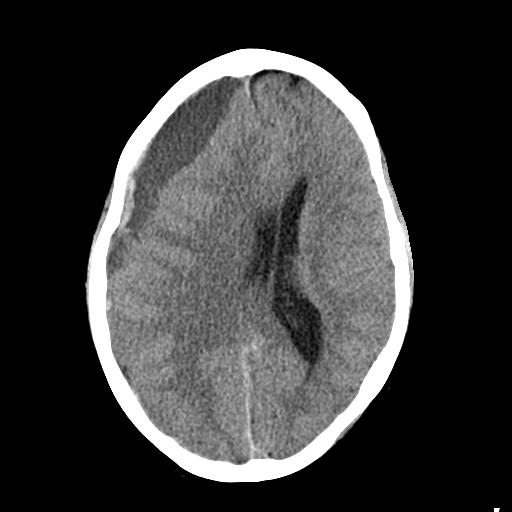
[im 18/33  bone]
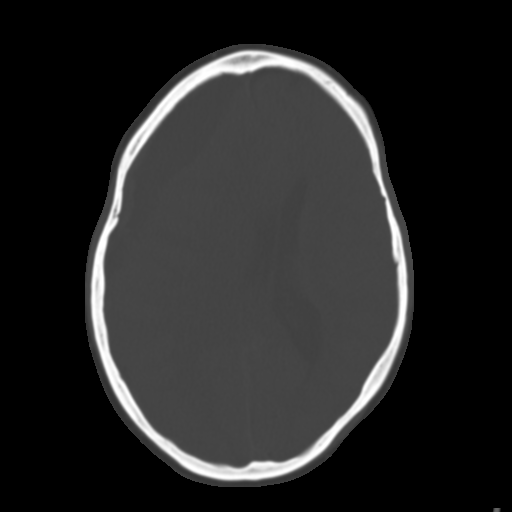
[im 23/33  brain]
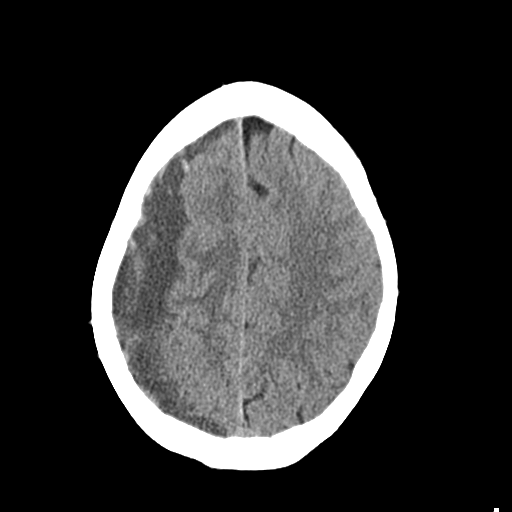
[im 26/33  brain]
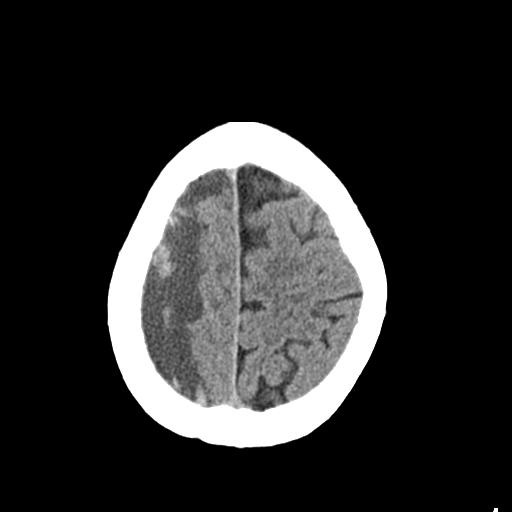
[im 30/33  brain]
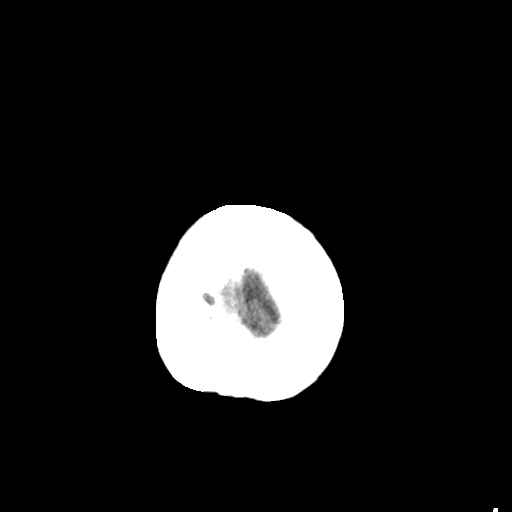

[Series 4: head 3.0 mpr cor · coronal · 0.32mm/px · 3 of 84 slices shown]
[im 28/84  brain]
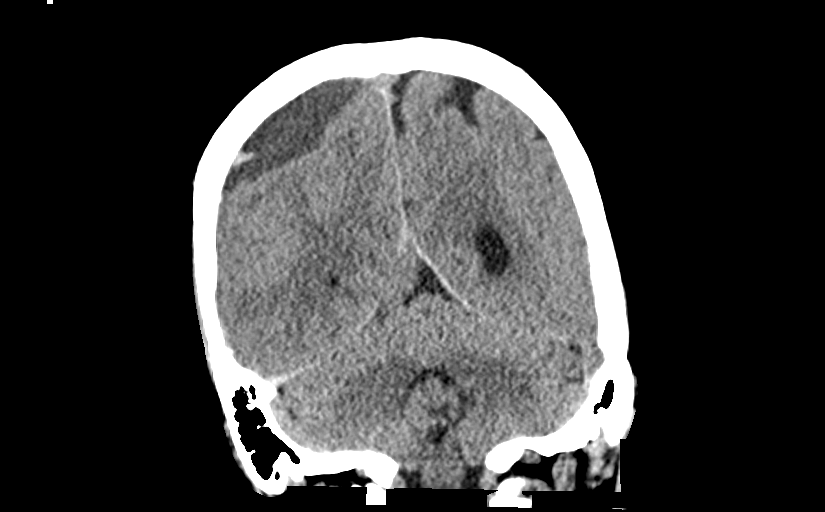
[im 37/84  brain]
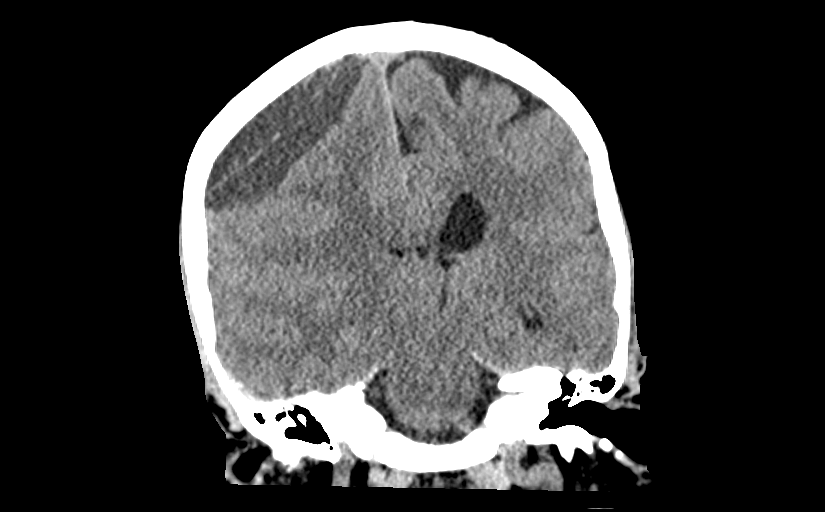
[im 47/84  brain]
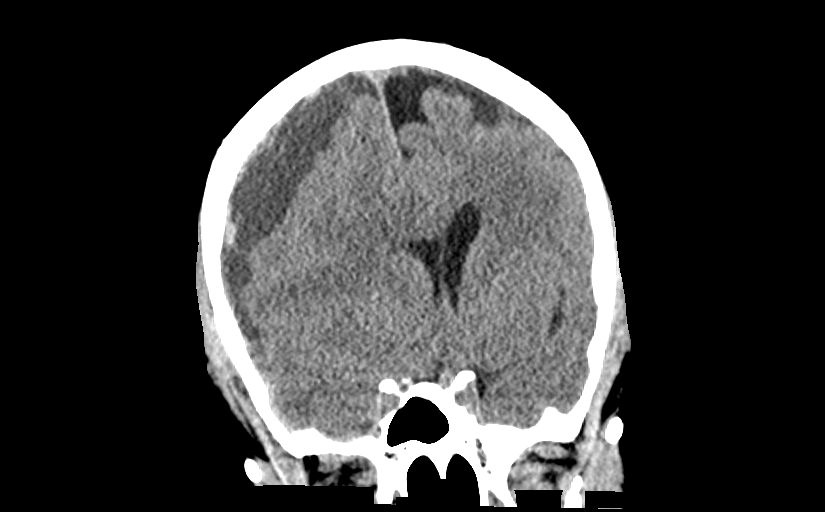

[Series 5: head 3.0 mpr sag · sagittal · 0.32mm/px · 3 of 84 slices shown]
[im 28/84  brain]
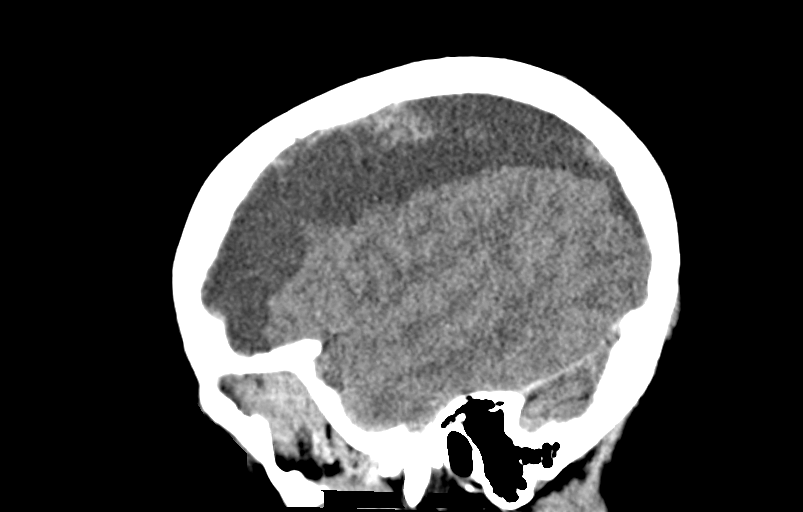
[im 42/84  brain]
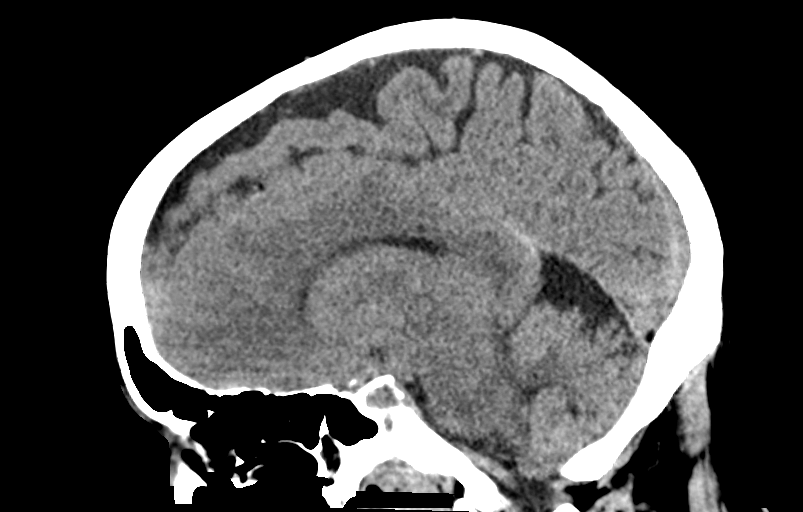
[im 56/84  brain]
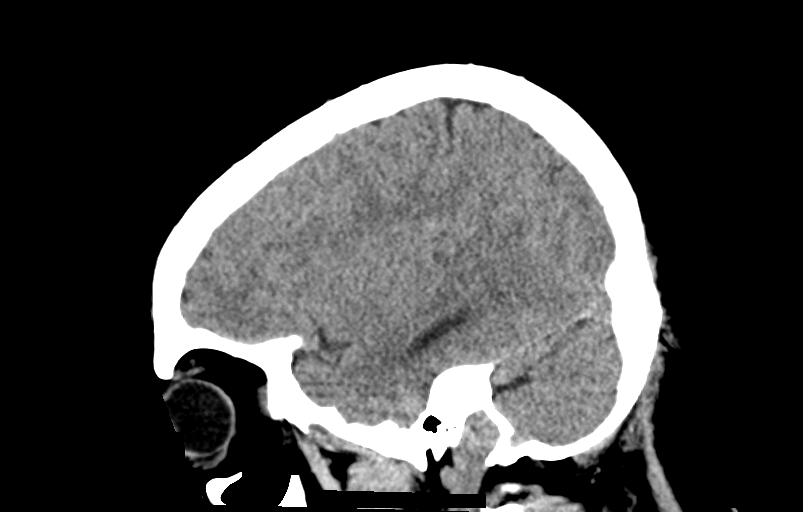

[14 of 47 positions shown; findings below may reference images not displayed]

FINDINGS: Brain: There is a chronic right subdural hematoma with some acute
components. This hematoma measures 2.1 cm in depth on image 23.
There is compression upon the right frontotemporal region with shift
of the midline septum to the left by approximately 13 mm. No
evidence of infarction or intraparenchymal hemorrhage is seen.

Vascular: No vascular abnormality is seen on this unenhanced study.

Skull: No calvarial abnormality is noted.

Sinuses/Orbits: The paranasal sinuses appear pneumatized. The
orbital rims are intact.

Other: None
IMPRESSION: 1. Chronic right subdural hematoma with a few acute components with
shift of the midline septum to the left by 13 mm and compression of
the right anterior frontal and temporal lobes.
2. No acute intraparenchymal abnormality.

## 2016-06-17 SURGERY — CRANIOTOMY HEMATOMA EVACUATION SUBDURAL
Anesthesia: General

## 2016-06-17 MED ORDER — EPHEDRINE SULFATE 50 MG/ML IJ SOLN
INTRAMUSCULAR | Status: DC | PRN
  Administered 2016-06-17: 10 mg via INTRAVENOUS

## 2016-06-17 MED ORDER — SODIUM CHLORIDE 0.9 % IV SOLN: INTRAVENOUS | Status: DC | PRN

## 2016-06-17 MED ORDER — LACTATED RINGERS IV SOLN
INTRAVENOUS | Status: DC
  Administered 2016-06-17: 14:00:00 via INTRAVENOUS

## 2016-06-17 MED ORDER — CEFAZOLIN IN D5W 1 GM/50ML IV SOLN
1.0000 g | Freq: Three times a day (TID) | INTRAVENOUS | Status: AC
  Administered 2016-06-17 – 2016-06-18 (×2): 1 g via INTRAVENOUS
  Filled 2016-06-17 (×3): qty 50

## 2016-06-17 MED ORDER — BACITRACIN ZINC 500 UNIT/GM EX OINT
TOPICAL_OINTMENT | CUTANEOUS | Status: DC | PRN
  Administered 2016-06-17: 1 via TOPICAL

## 2016-06-17 MED ORDER — ONDANSETRON HCL 4 MG/2ML IJ SOLN
INTRAMUSCULAR | Status: AC
  Filled 2016-06-17: qty 2

## 2016-06-17 MED ORDER — EPHEDRINE 5 MG/ML INJ
INTRAVENOUS | Status: AC
  Filled 2016-06-17: qty 10

## 2016-06-17 MED ORDER — LIDOCAINE HCL (CARDIAC) 20 MG/ML IV SOLN
INTRAVENOUS | Status: DC | PRN
  Administered 2016-06-17: 100 mg via INTRAVENOUS

## 2016-06-17 MED ORDER — LIDOCAINE-EPINEPHRINE 1 %-1:100000 IJ SOLN
INTRAMUSCULAR | Status: AC
  Filled 2016-06-17: qty 1

## 2016-06-17 MED ORDER — PROMETHAZINE HCL 25 MG/ML IJ SOLN: 6.2500 mg | INTRAMUSCULAR | Status: DC | PRN

## 2016-06-17 MED ORDER — HEMOSTATIC AGENTS (NO CHARGE) OPTIME
TOPICAL | Status: DC | PRN
  Administered 2016-06-17: 1 via TOPICAL

## 2016-06-17 MED ORDER — LEVOTHYROXINE SODIUM 75 MCG PO TABS
150.0000 ug | ORAL_TABLET | Freq: Every day | ORAL | Status: DC
  Administered 2016-06-18 – 2016-06-21 (×4): 150 ug via ORAL
  Filled 2016-06-17 (×4): qty 2

## 2016-06-17 MED ORDER — PHENYLEPHRINE HCL 10 MG/ML IJ SOLN
INTRAVENOUS | Status: DC | PRN
  Administered 2016-06-17: 20 ug/min via INTRAVENOUS

## 2016-06-17 MED ORDER — CEFAZOLIN SODIUM-DEXTROSE 2-3 GM-% IV SOLR
INTRAVENOUS | Status: DC | PRN
  Administered 2016-06-17: 2 g via INTRAVENOUS

## 2016-06-17 MED ORDER — POTASSIUM CHLORIDE IN NACL 20-0.9 MEQ/L-% IV SOLN
INTRAVENOUS | Status: DC
  Administered 2016-06-17: 20:00:00 via INTRAVENOUS
  Filled 2016-06-17 (×2): qty 1000

## 2016-06-17 MED ORDER — DASATINIB 100 MG PO TABS
100.0000 mg | ORAL_TABLET | Freq: Every day | ORAL | Status: DC
  Administered 2016-06-17: 100 mg via ORAL
  Filled 2016-06-17 (×3): qty 1

## 2016-06-17 MED ORDER — LABETALOL HCL 5 MG/ML IV SOLN
10.0000 mg | INTRAVENOUS | Status: DC | PRN
  Administered 2016-06-17: 10 mg via INTRAVENOUS
  Filled 2016-06-17: qty 4

## 2016-06-17 MED ORDER — SODIUM CHLORIDE 0.9 % IR SOLN
Status: DC | PRN
  Administered 2016-06-17: 16:00:00

## 2016-06-17 MED ORDER — DOUBLE ANTIBIOTIC 500-10000 UNIT/GM EX OINT
TOPICAL_OINTMENT | CUTANEOUS | Status: AC
  Filled 2016-06-17: qty 1

## 2016-06-17 MED ORDER — LIDOCAINE-EPINEPHRINE 1 %-1:100000 IJ SOLN
INTRAMUSCULAR | Status: DC | PRN
  Administered 2016-06-17: 10 mL

## 2016-06-17 MED ORDER — SUCCINYLCHOLINE CHLORIDE 200 MG/10ML IV SOSY
PREFILLED_SYRINGE | INTRAVENOUS | Status: DC | PRN
  Administered 2016-06-17: 120 mg via INTRAVENOUS

## 2016-06-17 MED ORDER — FENTANYL CITRATE (PF) 100 MCG/2ML IJ SOLN
INTRAMUSCULAR | Status: AC
  Filled 2016-06-17: qty 4

## 2016-06-17 MED ORDER — THROMBIN 20000 UNITS EX SOLR
CUTANEOUS | Status: AC
  Filled 2016-06-17: qty 40000

## 2016-06-17 MED ORDER — SODIUM CHLORIDE 0.9 % IV SOLN
INTRAVENOUS | Status: DC | PRN
  Administered 2016-06-17: 15:00:00 via INTRAVENOUS

## 2016-06-17 MED ORDER — 0.9 % SODIUM CHLORIDE (POUR BTL) OPTIME
TOPICAL | Status: DC | PRN
  Administered 2016-06-17 (×2): 1000 mL

## 2016-06-17 MED ORDER — ONDANSETRON HCL 4 MG/2ML IJ SOLN
INTRAMUSCULAR | Status: DC | PRN
  Administered 2016-06-17: 4 mg via INTRAVENOUS

## 2016-06-17 MED ORDER — ONDANSETRON HCL 4 MG/2ML IJ SOLN
4.0000 mg | INTRAMUSCULAR | Status: DC | PRN
  Administered 2016-06-18 (×2): 4 mg via INTRAVENOUS
  Filled 2016-06-17 (×2): qty 2

## 2016-06-17 MED ORDER — HYDROCODONE-ACETAMINOPHEN 5-325 MG PO TABS
1.0000 | ORAL_TABLET | ORAL | Status: DC | PRN
  Administered 2016-06-17 – 2016-06-18 (×2): 1 via ORAL
  Filled 2016-06-17 (×2): qty 1

## 2016-06-17 MED ORDER — LISINOPRIL 20 MG PO TABS
20.0000 mg | ORAL_TABLET | Freq: Every day | ORAL | Status: DC
  Administered 2016-06-18 – 2016-06-21 (×4): 20 mg via ORAL
  Filled 2016-06-17 (×4): qty 1

## 2016-06-17 MED ORDER — ROCURONIUM BROMIDE 100 MG/10ML IV SOLN
INTRAVENOUS | Status: DC | PRN
  Administered 2016-06-17: 60 mg via INTRAVENOUS

## 2016-06-17 MED ORDER — FAMOTIDINE IN NACL 20-0.9 MG/50ML-% IV SOLN
20.0000 mg | Freq: Two times a day (BID) | INTRAVENOUS | Status: DC
  Administered 2016-06-17 – 2016-06-18 (×2): 20 mg via INTRAVENOUS
  Filled 2016-06-17 (×2): qty 50

## 2016-06-17 MED ORDER — HYDROMORPHONE HCL 1 MG/ML IJ SOLN
0.2500 mg | INTRAMUSCULAR | Status: DC | PRN
  Administered 2016-06-17: 0.5 mg via INTRAVENOUS

## 2016-06-17 MED ORDER — PROPOFOL 10 MG/ML IV BOLUS
INTRAVENOUS | Status: DC | PRN
  Administered 2016-06-17: 200 mg via INTRAVENOUS

## 2016-06-17 MED ORDER — THROMBIN 20000 UNITS EX SOLR
CUTANEOUS | Status: DC | PRN
  Administered 2016-06-17: 16:00:00 via TOPICAL

## 2016-06-17 MED ORDER — ESMOLOL HCL 100 MG/10ML IV SOLN
INTRAVENOUS | Status: DC | PRN
  Administered 2016-06-17 (×4): 50 mg via INTRAVENOUS

## 2016-06-17 MED ORDER — SUCCINYLCHOLINE CHLORIDE 200 MG/10ML IV SOSY
PREFILLED_SYRINGE | INTRAVENOUS | Status: AC
  Filled 2016-06-17: qty 10

## 2016-06-17 MED ORDER — ACETAMINOPHEN 650 MG RE SUPP: 650.0000 mg | RECTAL | Status: DC | PRN

## 2016-06-17 MED ORDER — SUGAMMADEX SODIUM 200 MG/2ML IV SOLN
INTRAVENOUS | Status: DC | PRN
  Administered 2016-06-17: 160 mg via INTRAVENOUS

## 2016-06-17 MED ORDER — HYDROMORPHONE HCL 1 MG/ML IJ SOLN
INTRAMUSCULAR | Status: AC
  Filled 2016-06-17: qty 0.5

## 2016-06-17 MED ORDER — THROMBIN 5000 UNITS EX SOLR
OROMUCOSAL | Status: DC | PRN
  Administered 2016-06-17: 16:00:00 via TOPICAL

## 2016-06-17 MED ORDER — CEFAZOLIN SODIUM-DEXTROSE 2-4 GM/100ML-% IV SOLN
INTRAVENOUS | Status: AC
  Filled 2016-06-17: qty 100

## 2016-06-17 MED ORDER — ACETAMINOPHEN 325 MG PO TABS: 650.0000 mg | ORAL_TABLET | ORAL | Status: DC | PRN

## 2016-06-17 MED ORDER — DASATINIB 100 MG PO TABS: 100.0000 mg | ORAL_TABLET | Freq: Every day | ORAL | Status: DC

## 2016-06-17 MED ORDER — FENTANYL CITRATE (PF) 100 MCG/2ML IJ SOLN
INTRAMUSCULAR | Status: DC | PRN
  Administered 2016-06-17 (×4): 50 ug via INTRAVENOUS

## 2016-06-17 MED ORDER — VERAPAMIL HCL ER 240 MG PO TBCR
240.0000 mg | EXTENDED_RELEASE_TABLET | Freq: Every day | ORAL | Status: DC
  Administered 2016-06-17 – 2016-06-20 (×3): 240 mg via ORAL
  Filled 2016-06-17 (×5): qty 1

## 2016-06-17 MED ORDER — ONDANSETRON HCL 4 MG PO TABS: 4.0000 mg | ORAL_TABLET | ORAL | Status: DC | PRN

## 2016-06-17 MED ORDER — SODIUM CHLORIDE 0.9 % IV SOLN
500.0000 mg | Freq: Two times a day (BID) | INTRAVENOUS | Status: DC
  Administered 2016-06-17 – 2016-06-18 (×3): 500 mg via INTRAVENOUS
  Filled 2016-06-17 (×4): qty 5

## 2016-06-17 MED ORDER — MORPHINE SULFATE (PF) 2 MG/ML IV SOLN: 1.0000 mg | INTRAVENOUS | Status: DC | PRN

## 2016-06-17 MED ORDER — THROMBIN 5000 UNITS EX SOLR
CUTANEOUS | Status: AC
  Filled 2016-06-17: qty 5000

## 2016-06-17 SURGICAL SUPPLY — 57 items
BAG DECANTER FOR FLEXI CONT (MISCELLANEOUS) ×2 IMPLANT
BUR ACORN 6.0 PRECISION (BURR) ×2 IMPLANT
BUR SPIRAL ROUTER 2.3 (BUR) ×2 IMPLANT
CANISTER SUCT 3000ML PPV (MISCELLANEOUS) ×2 IMPLANT
CATH VENTRIC 35X38 W/TROCAR LG (CATHETERS) ×2 IMPLANT
CLIP TI MEDIUM 6 (CLIP) IMPLANT
DRAPE MICROSCOPE LEICA (MISCELLANEOUS) IMPLANT
DRAPE NEUROLOGICAL W/INCISE (DRAPES) ×2 IMPLANT
DRAPE SURG 17X23 STRL (DRAPES) IMPLANT
DRAPE WARM FLUID 44X44 (DRAPE) ×2 IMPLANT
DURAMATRIX ONLAY 3X3 (Plate) ×2 IMPLANT
DURAPREP 6ML APPLICATOR 50/CS (WOUND CARE) ×2 IMPLANT
ELECT CAUTERY BLADE 6.4 (BLADE) ×2 IMPLANT
ELECT REM PT RETURN 9FT ADLT (ELECTROSURGICAL) ×2
ELECTRODE REM PT RTRN 9FT ADLT (ELECTROSURGICAL) ×1 IMPLANT
EVACUATOR 1/8 PVC DRAIN (DRAIN) ×2 IMPLANT
GAUZE SPONGE 4X4 12PLY STRL (GAUZE/BANDAGES/DRESSINGS) ×2 IMPLANT
GAUZE SPONGE 4X4 16PLY XRAY LF (GAUZE/BANDAGES/DRESSINGS) IMPLANT
GLOVE BIO SURGEON STRL SZ8 (GLOVE) ×2 IMPLANT
GOWN STRL REUS W/ TWL LRG LVL3 (GOWN DISPOSABLE) IMPLANT
GOWN STRL REUS W/ TWL XL LVL3 (GOWN DISPOSABLE) IMPLANT
GOWN STRL REUS W/TWL 2XL LVL3 (GOWN DISPOSABLE) ×2 IMPLANT
GOWN STRL REUS W/TWL LRG LVL3 (GOWN DISPOSABLE)
GOWN STRL REUS W/TWL XL LVL3 (GOWN DISPOSABLE)
HEMOSTAT POWDER KIT SURGIFOAM (HEMOSTASIS) ×2 IMPLANT
KIT BASIN OR (CUSTOM PROCEDURE TRAY) ×2 IMPLANT
KIT DRAIN CSF ACCUDRAIN (MISCELLANEOUS) ×2 IMPLANT
KIT ROOM TURNOVER OR (KITS) ×2 IMPLANT
NEEDLE HYPO 22GX1.5 SAFETY (NEEDLE) ×2 IMPLANT
NS IRRIG 1000ML POUR BTL (IV SOLUTION) ×4 IMPLANT
PACK CRANIOTOMY (CUSTOM PROCEDURE TRAY) ×2 IMPLANT
PAD ARMBOARD 7.5X6 YLW CONV (MISCELLANEOUS) ×6 IMPLANT
PATTIES SURGICAL .25X.25 (GAUZE/BANDAGES/DRESSINGS) IMPLANT
PATTIES SURGICAL .5 X.5 (GAUZE/BANDAGES/DRESSINGS) IMPLANT
PATTIES SURGICAL .5 X3 (DISPOSABLE) IMPLANT
PATTIES SURGICAL 1X1 (DISPOSABLE) IMPLANT
PEN SKIN MARKING BROAD (MISCELLANEOUS) ×2 IMPLANT
PERFORATOR LRG  14-11MM (BIT)
PERFORATOR LRG 14-11MM (BIT) IMPLANT
PIN MAYFIELD SKULL DISP (PIN) ×2 IMPLANT
PLATE 1.5  2HOLE LNG NEURO (Plate) ×3 IMPLANT
PLATE 1.5 2HOLE LNG NEURO (Plate) ×3 IMPLANT
RUBBERBAND STERILE (MISCELLANEOUS) IMPLANT
SCREW SELF DRILL HT 1.5/4MM (Screw) ×12 IMPLANT
SPONGE NEURO XRAY DETECT 1X3 (DISPOSABLE) IMPLANT
SPONGE SURGIFOAM ABS GEL 100 (HEMOSTASIS) ×4 IMPLANT
STAPLER VISISTAT 35W (STAPLE) ×2 IMPLANT
SUT ETHILON 3 0 FSL (SUTURE) IMPLANT
SUT NURALON 4 0 TR CR/8 (SUTURE) ×4 IMPLANT
SUT VIC AB 2-0 CP2 18 (SUTURE) ×4 IMPLANT
SYR CONTROL 10ML LL (SYRINGE) ×2 IMPLANT
TAPE CLOTH SURG 4X10 WHT LF (GAUZE/BANDAGES/DRESSINGS) ×2 IMPLANT
TOWEL OR 17X24 6PK STRL BLUE (TOWEL DISPOSABLE) ×2 IMPLANT
TOWEL OR 17X26 10 PK STRL BLUE (TOWEL DISPOSABLE) ×2 IMPLANT
TRAY FOLEY W/METER SILVER 16FR (SET/KITS/TRAYS/PACK) IMPLANT
UNDERPAD 30X30 (UNDERPADS AND DIAPERS) IMPLANT
WATER STERILE IRR 1000ML POUR (IV SOLUTION) ×2 IMPLANT

## 2016-06-17 NOTE — Progress Notes (Signed)
   06/17/16 1230  Clinical Encounter Type  Visited With Patient and family together  Visit Type (AD)  Referral From Nurse  Consult/Referral To Chaplain  Spiritual Encounters  Spiritual Needs Other (Comment) (AD)  Stress Factors  Patient Stress Factors Health changes  Family Stress Factors Family relationships  Paged by 3rd floor to provide AD to Pt. Explained AD procedures to Pt and family. Available as needed.

## 2016-06-17 NOTE — ED Provider Notes (Signed)
[facility] DEPT Provider Note   CSN: [id number] Arrival date & time: 06/17/16  1049     History   Chief Complaint No chief complaint on file.   HPI Christina [name] is a 55 y.o. female.  The history is provided by the patient and medical records.    55 y.o. F with hx of allergic rhinitis, fibroids, grave's disease, HTN, hypothyroidism, MPN, CML, vit D deficiency, presenting to the ED for acute brain bleed.  Patient states for the past month or more she has been having a persistent, pressure like headache in her temples and forehead.  States she saw her PCP for this and was prescribed a course of abx for possible sinusitis as she had some associated nasal congestion.  States she got mildly better, however symptoms worsened again. She was seen at urgent care and was started on another course of antibiotics. She followed up with her PCP yesterday due to ongoing headache and was sent for CT scan of her head and face.  States she was called this morning and told to come back due to brain bleed.  States she has continued having headache with some intermittent lightheadedness.  She denies any dizziness, focal numbness, focal weakness, difficulty walking, vision changes, tinnitus, or changes in speech.  Her speech is somewhat abnormal due to prior mouth surgeries, however unchanged from her baseline.  Patient does take daily ASA, no other anticoagulation. She denies any recent head injury or trauma. No history of abnormal bleeding in the past. She does have history of CML, however reports her platelet counts were always normal. She is followed by oncology, Dr. Alvy Bimler.  Patient was able to drive herself to the ED today.  Past Medical History:  Diagnosis Date  . Allergic rhinitis   . Fibroid   . Fibroids   . Grave's disease   . H/O Graves' disease   . H/O insomnia   . H/O menorrhagia 2012  . History of chicken pox   . Hypertension   . Hyperthyroidism    s/p rai 8/06 now hypothyroid Dr. Suzette Battiest  .  Hypothyroid   . Infertility, female   . Menorrhagia    Had Novasure  . 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 Polyp of cervix    Endometriel polyp. JO  . Vitamin D deficiency   . Vitamin D deficiency     Patient Active Problem List   Diagnosis Date Noted  . Pancytopenia due to antineoplastic chemotherapy ([place]) 06/30/2015  . Elevated diaphragm 06/30/2015  . DOE (dyspnea on exertion) 06/30/2015  . Diarrhea due to drug 06/30/2015  . CML (chronic myeloid leukemia) ([place]) 05/18/2015  . Encounter for chemotherapy management 05/18/2015  . 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 Hyperuricemia 05/15/2015  . Essential hypertension 11/27/2014  . Shortness of breath 12/03/2013  . Hx of Graves' disease 12/03/2013  . Elevated blood pressure reading 12/03/2013  . Special screening for malignant neoplasms, colon 08/09/2013  . Insomnia 04/29/2012  . Medication management 04/29/2012  . Foot pain 04/29/2012  . Polyp of cervix   . Fibroids   . Vitamin D deficiency   . Cubital tunnel syndrome 09/05/2011  . TENDINITIS 12/19/2008  . HYPERTHYROIDISM 03/06/2007  . ARTHRALGIA 03/05/2007  . INSOMNIA 03/05/2007  . [place] DISEASE 03/02/2007  . LOSS, HEARING NOS 03/02/2007  . Allergic rhinitis 03/02/2007    Past Surgical History:  Procedure Laterality Date  . BUNIONECTOMY  1986  . DILATION AND CURETTAGE OF UTERUS  09/15/2010  . HYSTEROSCOPY W/D&C  09/28/2010  .  MOUTH SURGERY    . NOVASURE ABLATION  09/28/2010  . REFRACTIVE SURGERY     for vision  . TONSILLECTOMY  1965    OB History    No data available       Home Medications    Prior to Admission medications   Medication Sig Start Date End Date Taking? Authorizing Provider  amoxicillin (AMOXIL) 875 MG tablet Take 1 tablet (875 mg total) by mouth 2 (two) times daily. 06/12/16   Melony Overly, MD  aspirin 81 MG tablet Take 81 mg by mouth daily.    Historical Provider, MD  dasatinib (SPRYCEL) 100 MG tablet Take 1 tablet (100 mg  total) by mouth daily. 04/21/16   Heath Lark, MD  ibuprofen (ADVIL,MOTRIN) 200 MG tablet Take 200 mg by mouth every 6 (six) hours as needed.    Historical Provider, MD  lisinopril (PRINIVIL,ZESTRIL) 20 MG tablet Take 1 tablet (20 mg total) by mouth daily. Patient taking differently: Take 40 mg by mouth daily.  09/16/15   Burnis Medin, MD  SYNTHROID 150 MCG tablet Take 1 tablet (150 mcg total) by mouth daily before breakfast. 08/05/15   Burnis Medin, MD  verapamil (CALAN-SR) 240 MG CR tablet Take 1 tablet (240 mg total) by mouth at bedtime. 02/01/16   Burnis Medin, MD  zolpidem (AMBIEN) 10 MG tablet TAKE 1/2-1 TABLET BY MOUTH DAILY AT BEDTIME AS NEEDED 05/02/16   Burnis Medin, MD    Family History Family History  Problem Relation Age of Onset  . Diabetes Mother   . Hypertension    . Prostate cancer      grandfather  . Hypertension Father   . Cancer Maternal Uncle     leukemia    Social History Social History  Substance Use Topics  . Smoking status: Never Smoker  . Smokeless tobacco: Never Used  . Alcohol use Yes     Comment: socially     Allergies   Patient has no known allergies.   Review of Systems Review of Systems  Neurological: Positive for headaches.  All other systems reviewed and are negative.    Physical Exam Updated Vital Signs BP 139/85 (BP Location: Right Arm)   Pulse 79   Temp 97.5 F (36.4 C) (Oral)   Resp 18   Ht 5\' 8"  (1.727 m)   Wt 81.6 kg   SpO2 99%   BMI 27.37 kg/m   Physical Exam  Constitutional: She is oriented to person, place, and time. She appears well-developed and well-nourished.  HENT:  Head: Normocephalic and atraumatic.  Right Ear: Tympanic membrane normal.  Left Ear: Tympanic membrane normal.  Nose: Mucosal edema present.  Mouth/Throat: Uvula is midline, oropharynx is clear and moist and mucous membranes are normal.  Scars noted to left side of mouth + nasal congestion  Eyes: Conjunctivae and EOM are normal. Pupils are  equal, round, and reactive to light.  Neck: Normal range of motion.  Cardiovascular: Normal rate, regular rhythm and normal heart sounds.   Pulmonary/Chest: Effort normal and breath sounds normal.  Abdominal: Soft. Bowel sounds are normal.  Musculoskeletal: Normal range of motion.  Neurological: She is alert and oriented to person, place, and time.  AAOx3, answering questions and following commands appropriately; equal strength UE and LE bilaterally; CN grossly intact; moves all extremities appropriately without ataxia; no focal neuro deficits or facial asymmetry appreciated; speech abnormality noted (baseline due to prior surgeries)  Skin: Skin is warm and dry.  Psychiatric:  She has a normal mood and affect.  Nursing note and vitals reviewed.    ED Treatments / Results  Labs (all labs ordered are listed, but only abnormal results are displayed) Labs Reviewed  CBC WITH DIFFERENTIAL/PLATELET - Abnormal; Notable for the following:       Result Value   RBC 3.59 (*)    Hemoglobin 11.1 (*)    HCT 33.1 (*)    All other components within normal limits  COMPREHENSIVE METABOLIC PANEL - Abnormal; Notable for the following:    Glucose, Bld 104 (*)    Total Bilirubin 0.2 (*)    All other components within normal limits  PROTIME-INR  APTT    EKG  EKG Interpretation None       Radiology Ct Head Wo Contrast  Result Date: 06/17/2016 CLINICAL DATA:  Persistent headache, fever EXAM: CT HEAD WITHOUT CONTRAST TECHNIQUE: Contiguous axial images were obtained from the base of the skull through the vertex without intravenous contrast. COMPARISON:  CT orbit of 03/19/2005 FINDINGS: Brain: There is a chronic right subdural hematoma with some acute components. This hematoma measures 2.1 cm in depth on image 23. There is compression upon the right frontotemporal region with shift of the midline septum to the left by approximately 13 mm. No evidence of infarction or intraparenchymal hemorrhage is seen.  Vascular: No vascular abnormality is seen on this unenhanced study. Skull: No calvarial abnormality is noted. Sinuses/Orbits: The paranasal sinuses appear pneumatized. The orbital rims are intact. Other: None IMPRESSION: 1. 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 2. No acute intraparenchymal abnormality. Electronically Signed   By: Ivar Drape M.D.   On: 06/17/2016 10:12   Ct Maxillofacial Limited Wo Contrast  Result Date: 06/17/2016 CLINICAL DATA:  Facial pain and pressure for several weeks, persistent headache EXAM: CT PARANASAL SINUS LIMITED WITHOUT CONTRAST TECHNIQUE: Non-contiguous multidetector CT images of the paranasal sinuses were obtained in a single plane without contrast. COMPARISON:  CT brain scan of 08/08/2014 FINDINGS: No definite sinusitis is seen. Only mild mucosal thickening is present in the floor of the right maxillary sinus. Sutures are noted in the floor both maxillary sinuses from prior surgical intervention. The medial wall of the maxillary sinus appears to be absent, and the patient may have undergone medial antrostomy on the right. The nasal turbinates are normal in size and position and the nasal airway is patent. No bony abnormality is seen. No soft tissue abnormality is evident. IMPRESSION: 1. No definite sinusitis. Mild mucosal thickening in the floor of the right maxillary sinus. 2. Question of prior right medial antrostomy with apparent absence of the medial wall of the right maxillary sinus. Electronically Signed   By: Ivar Drape M.D.   On: 06/17/2016 10:06    Procedures Procedures (including critical care time)  Medications Ordered in ED Medications - No data to display   Initial Impression / Assessment and Plan / ED Course  I have reviewed the triage vital signs and the nursing notes.  Pertinent labs & imaging results that  were available during my care of the patient were reviewed by me and considered in my medical decision making (see chart for details).  Clinical Course    55 year old female here with outpatient CT concerning for acute on chronic subdural hematoma with midline shift. Clinically, she appears well. She has no focal neurologic deficits here.  She was even able to drive herself to the emergency department. Her vitals are stable. Does have history of CML, most recent platelet counts were normal. She does take a daily aspirin. No recent head injury or trauma. Will repeat labs to check platelet count and coags. Neurosurgery paged.  11:37 AM Case discussed with neurosurgery, Dr. Ronnald Ramp. Patient likely will need operative repair.  Agrees with labs ordered thus far.  Requested pre-op 12 lead EKG and CXR which I have ordered. He will come evaluate patient.  She was updated on plan of care.  12:33 PM Dr. Ronnald Ramp has evaluated patient and discussed surgical intervention with patient and husband at bedside.  They are having advanced directives discussion at this time.  Will keep NPO in anticipation for surgery this afternoon.  Lab work thus far reassuring.    Final Clinical Impressions(s) / ED Diagnoses   Final diagnoses:  Subdural hematoma [facility])    New Prescriptions New Prescriptions   No medications on file     Larene Pickett, PA-C 06/17/16 [place], MD 06/18/16 [phone]

## 2016-06-17 NOTE — ED Triage Notes (Signed)
Pt presents to the ed after getting a head CT with a confirmed head bleed.  She has been to the doctor multiple times over the last five weeks for headache and was diagnosed with sinus infection and given antibiotics with no relief.  She went back to her doctor yesterday who ordered the head ct.  She states she is in no pain today and is neurologically intact

## 2016-06-17 NOTE — ED Notes (Signed)
Patient moved to a different room per ok by charge.

## 2016-06-17 NOTE — Anesthesia Preprocedure Evaluation (Addendum)
Anesthesia Evaluation  Patient identified by MRN, date of birth, ID band Patient awake    Reviewed: Allergy & Precautions, NPO status , Patient's Chart, lab work & pertinent test results  History of Anesthesia Complications Negative for: history of anesthetic complications  Airway Mallampati: II  TM Distance: >3 FB Neck ROM: Full    Dental no notable dental hx. (+) Dental Advisory Given   Pulmonary neg pulmonary ROS,    Pulmonary exam normal        Cardiovascular hypertension, Pt. on medications Normal cardiovascular exam     Neuro/Psych negative psych ROS   GI/Hepatic negative GI ROS, Neg liver ROS,   Endo/Other  Hypothyroidism   Renal/GU negative Renal ROS     Musculoskeletal   Abdominal   Peds  Hematology Leukemia   Anesthesia Other Findings   Reproductive/Obstetrics                            Anesthesia Physical Anesthesia Plan  ASA: III and emergent  Anesthesia Plan: General   Post-op Pain Management:    Induction: Intravenous, Rapid sequence and Cricoid pressure planned  Airway Management Planned: Oral ETT  Additional Equipment: Arterial line  Intra-op Plan:   Post-operative Plan: Possible Post-op intubation/ventilation  Informed Consent: I have reviewed the patients History and Physical, chart, labs and discussed the procedure including the risks, benefits and alternatives for the proposed anesthesia with the patient or authorized representative who has indicated his/her understanding and acceptance.   Dental advisory given  Plan Discussed with: Anesthesiologist and CRNA  Anesthesia Plan Comments:        Anesthesia Quick Evaluation

## 2016-06-17 NOTE — ED Notes (Signed)
Pt returns from xray. Discussing advanced directives with chaplin.

## 2016-06-17 NOTE — Anesthesia Procedure Notes (Signed)
Procedure Name: Intubation Date/Time: 06/17/2016 3:13 PM Performed by: Salli Quarry Marcia Hartwell Pre-anesthesia Checklist: Patient identified, Emergency Drugs available, Suction available and Patient being monitored Patient Re-evaluated:Patient Re-evaluated prior to inductionOxygen Delivery Method: Circle System Utilized Preoxygenation: Pre-oxygenation with 100% oxygen Intubation Type: IV induction Ventilation: Mask ventilation without difficulty Laryngoscope Size: Mac and 3 Grade View: Grade II Tube type: Subglottic suction tube Tube size: 7.0 mm Number of attempts: 1 Airway Equipment and Method: Stylet Placement Confirmation: ETT inserted through vocal cords under direct vision,  positive ETCO2 and breath sounds checked- equal and bilateral Secured at: 22 cm Tube secured with: Tape Dental Injury: Teeth and Oropharynx as per pre-operative assessment

## 2016-06-17 NOTE — Transfer of Care (Signed)
Immediate Anesthesia Transfer of Care Note  Patient: Christina [name]  Procedure(s) Performed: Procedure(s): RIGHT CRANIOTOMY HEMATOMA EVACUATION SUBDURAL (N/A)  Patient Location: PACU  Anesthesia Type:General  Level of Consciousness: awake, alert , oriented and patient cooperative  Airway & Oxygen Therapy: Patient Spontanous Breathing and Patient connected to face mask oxygen  Post-op Assessment: Report given to RN and Post -op Vital signs reviewed and stable  Post vital signs: Reviewed and stable  Last Vitals:  Vitals:   06/17/16 1333 06/17/16 1700  BP: 149/82   Pulse: 75   Resp: 18   Temp:  (P) 36.6 C    Last Pain:  Vitals:   06/17/16 1333  TempSrc:   PainSc: 5          Complications: No apparent anesthesia complications

## 2016-06-17 NOTE — ED Notes (Addendum)
Patient sent by MD office to consult with neurosurgery.   Patient was told she has a confirmed head bleed by CT.  Patient states she drove to the hospital.   Patient is to move to B15 as soon as it vacates per charge RN.

## 2016-06-17 NOTE — H&P (Signed)
Reason for Consult:SDH Referring Physician: EDP  Christina [name] is an 55 y.o. female.   HPI:  55 year old female seen in neurosurgical consultation regarding a right subdural hematoma. The patient started having headaches about 5 weeks ago. She was felt to have sinus disease. No numbness tingling or weakness or seizure activity. No history of trauma. No visual changes. CT scan today showed a large right frontal chronic subdural hematoma and neurosurgical evaluation was requested. Platelet count and coagulation studies are normal. History of CML.  Past Medical History:  Diagnosis Date  . Allergic rhinitis   . Fibroid   . Fibroids   . Grave'[name] disease   . H/O Graves' disease   . H/O insomnia   . H/O menorrhagia 2012  . History of chicken pox   . Hypertension   . Hyperthyroidism    [name]/p rai 8/06 now hypothyroid Dr. Suzette Battiest  . Hypothyroid   . Infertility, female   . Leukemia ([place])   . Menorrhagia    Had Novasure  . 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 Polyp of cervix    Endometriel polyp. JO  . Vitamin D deficiency   . Vitamin D deficiency     Past Surgical History:  Procedure Laterality Date  . BUNIONECTOMY  1986  . DILATION AND CURETTAGE OF UTERUS  09/15/2010  . HYSTEROSCOPY W/D&C  09/28/2010  . MOUTH SURGERY    . NOVASURE ABLATION  09/28/2010  . REFRACTIVE SURGERY     for vision  . TONSILLECTOMY  1965    No Known Allergies  Social History  Substance Use Topics  . Smoking status: Never Smoker  . Smokeless tobacco: Never Used  . Alcohol use Yes     Comment: socially    Family History  Problem Relation Age of Onset  . Diabetes Mother   . Hypertension    . Prostate cancer      grandfather  . Hypertension Father   . Cancer Maternal Uncle     leukemia     Review of Systems  Positive ROS: neg  All other systems have been reviewed and were otherwise negative with the exception of those mentioned in the HPI and as above.  Objective: Vital signs in last  24 hours: Temp:  [97.5 F (36.4 C)] 97.5 F (36.4 C) (11/10 1127) Pulse Rate:  [79] 79 (11/10 1127) Resp:  [18] 18 (11/10 1127) BP: (139)/(85) 139/85 (11/10 1127) SpO2:  [99 %] 99 % (11/10 1127) Weight:  [81.6 kg (180 lb)] 81.6 kg (180 lb) (11/10 1127)  General Appearance: Alert, cooperative, no distress, appears stated age Head: Normocephalic, without obvious abnormality, atraumatic Eyes: PERRL, conjunctiva/corneas clear, EOM'[name] intact Neck: Supple Lungs:  respirations unlabored Heart: Regular rate and rhythm   NEUROLOGIC:   Mental status: A&O x4, no aphasia, good attention span, Memory and fund of knowledge Motor Exam - grossly normal, normal tone and bulk Coordination - grossly normal Gait -not tested Balance - not tested Cranial Nerves: I: smell Not tested  II: visual acuity  OS: na    OD: na  II: visual fields Full to confrontation  II: pupils Equal, round, reactive to light  III,VII: ptosis None  III,IV,VI: extraocular muscles  Full ROM  V: mastication Normal  V: facial light touch sensation  Normal  V,VII: corneal reflex  Present  VII: facial muscle function - upper  Normal  VII: facial muscle function - lower Normal  VIII: hearing Not tested  IX: soft palate elevation  Normal  IX,X: gag reflex Present  XI: trapezius strength  5/5  XI: sternocleidomastoid strength 5/5  XI: neck flexion strength  5/5  XII: tongue strength  Normal    Data Review Lab Results  Component Value Date   WBC 6.7 06/17/2016   HGB 11.1 (L) 06/17/2016   HCT 33.1 (L) 06/17/2016   MCV 92.2 06/17/2016   PLT 236 06/17/2016   Lab Results  Component Value Date   NA 138 06/16/2016   K 4.1 06/16/2016   CL 105 06/16/2016   CO2 28 06/16/2016   BUN 18 06/16/2016   CREATININE 0.85 06/16/2016   GLUCOSE 83 06/16/2016   Lab Results  Component Value Date   INR 0.90 06/17/2016    Radiology: Ct Head Wo Contrast  Result Date: 06/17/2016 CLINICAL DATA:  Persistent headache, fever EXAM:  CT HEAD WITHOUT CONTRAST TECHNIQUE: Contiguous axial images were obtained from the base of the skull through the vertex without intravenous contrast. COMPARISON:  CT orbit of 03/19/2005 FINDINGS: Brain: There is a chronic right subdural hematoma with some acute components. This hematoma measures 2.1 cm in depth on image 23. There is compression upon the right frontotemporal region with shift of the midline septum to the left by approximately 13 mm. No evidence of infarction or intraparenchymal hemorrhage is seen. Vascular: No vascular abnormality is seen on this unenhanced study. Skull: No calvarial abnormality is noted. Sinuses/Orbits: The paranasal sinuses appear pneumatized. The orbital rims are intact. Other: None IMPRESSION: 1. 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 2. No acute intraparenchymal abnormality. Electronically Signed   By: Ivar Drape M.D.   On: 06/17/2016 10:12   Ct Maxillofacial Limited Wo Contrast  Result Date: 06/17/2016 CLINICAL DATA:  Facial pain and pressure for several weeks, persistent headache EXAM: CT PARANASAL SINUS LIMITED WITHOUT CONTRAST TECHNIQUE: Non-contiguous multidetector CT images of the paranasal sinuses were obtained in a single plane without contrast. COMPARISON:  CT brain scan of 08/08/2014 FINDINGS: No definite sinusitis is seen. Only mild mucosal thickening is present in the floor of the right maxillary sinus. Sutures are noted in the floor both maxillary sinuses from prior surgical intervention. The medial wall of the maxillary sinus appears to be absent, and the patient may have undergone medial antrostomy on the right. The nasal turbinates are normal in size and position and the nasal airway is patent. No bony abnormality is seen. No soft tissue abnormality is evident. IMPRESSION: 1. No definite sinusitis. Mild mucosal  thickening in the floor of the right maxillary sinus. 2. Question of prior right medial antrostomy with apparent absence of the medial wall of the right maxillary sinus. Electronically Signed   By: Ivar Drape M.D.   On: 06/17/2016 10:06   CT scan: Large right chronic subdural hematoma with greater than 1 cm of midline shift  Assessment/Plan: 61 short female with a right chronic subdural hematoma measuring about 2 cm with over a centimeter of midline shift. Minimal symptoms other than headache.  I have talked about ways to evacuate this. I do believe she needs surgical intervention. We have talked about twist drill craniotomy versus bur hole versus craniotomy. I have recommended a small craniotomy as I think the chance of recurrence smaller. We have talked at length about the lesion. I have gone over the films with them. Her husband is a physician and they are known  to me.  They understand the risks of the surgery include but are not limited to bleeding, infection, brain injury, stroke, loss of vision or speech, numbness tingling or weakness or plegia, recurrence of the lesion requiring further treatment, lack of relief of symptoms, worsening of symptoms, acute hemorrhage, and anesthesia risks including DVT pneumonia MI and death. They agree to proceed.   Christina [name] 06/17/2016 12:32 PM

## 2016-06-17 NOTE — Progress Notes (Signed)
Visited with patient to assist with completing advance directive. AD completed.  Copies were made and 2 copies given to patient husband for agents, the original for patient,and 1 copy to staff for patient chart. Patient going to [facility] for surgery. Chaplain available as needed.   06/17/16 1300  Clinical Encounter Type  Visited With Patient;Family;Patient and family together;Health care provider  Visit Type Initial;Spiritual support;Post-op;ED  Referral From Chaplain;Nurse  Spiritual Encounters  Spiritual Needs Literature;Emotional  Stress Factors  Patient Stress Factors None identified  Advance Directives (For Healthcare)  Does patient have an advance directive? Yes  Copy of advanced directive(s) in chart? Yes  Jaclynn Major, [place]

## 2016-06-17 NOTE — ED Notes (Signed)
To x-ray

## 2016-06-17 NOTE — Op Note (Signed)
06/17/2016  4:56 PM  PATIENT:  Christina [name]  55 y.o. female  PRE-OPERATIVE DIAGNOSIS:  Right chronic subdural hematoma  POST-OPERATIVE DIAGNOSIS:  Same  PROCEDURE:  Right frontal craniotomy for evacuation of subdural hematoma  SURGEON:  Sherley Bounds, MD  ASSISTANTS: Dr. Kathyrn Sheriff  ANESTHESIA:   General  EBL: 100 ml  Total I/O In: 1000 [I.V.:1000] Out: 100 [Blood:100]  BLOOD ADMINISTERED:none  DRAINS: Medium subgaleal Hemovac and subdural drain   SPECIMEN:  No Specimen  INDICATION FOR PROCEDURE: This patient presented today with headaches and was found to have a large right chronic subdural hematoma. I recommended a craniotomy for evacuation. Patient understood the risks, benefits, and alternatives and potential outcomes and wished to proceed.  PROCEDURE DETAILS: The patient was taken to the operating room and after induction of adequate generalized endotracheal anesthesia, the head was affixed in a 3 point Mayfield head rest, and turned to the left to expose the right frontotemporal parietal region. The head was shaved and then cleaned and then prepped with DuraPrep and draped in the usual sterile fashion. 10 cc of local anesthetic was injected, and a right frontal incision was made on the right of the head. Raney clips were placed to establish hemostasis of the scalp, the muscle was reflected with the scalp flap, to expose the right frontal region. A burr hole was placed, and a craniotomy flap was turned utilizing the high-speed, air powered drill. The flap was then placed in bacitracin-containing saline solution, and the dura was opened to expose a large right chronic subdural hematoma with multiple membranes. A hematoma was then removed with a combination of irrigation and suction. I continued to irrigate until the irrigant was clear to, and dried any bleeding with bipolar cautery. I then placed a subdural drain through separate stab incision and close the dura with a running 4-0  Nurolon suture. Dural tack up sutures were placed. The dura was lined with Gelfoam, and the craniotomy flap was replaced with doggie-bone plates. The wound was copiously irrigated. A subgaleal drain was placed, and the galea was then closed with interrupted 2-0 Vicryl suture. The skin was then closed with staples a sterile dressing was applied. The patient was then taken out of the 3-point Mayfield headrest and awakened from general anesthesia, and transported to the recovery room in stable condition. At the end of the procedure all sponge, needle, and instrument counts were correct.   PLAN OF CARE: Admit to inpatient   PATIENT DISPOSITION:  PACU - hemodynamically stable.   Delay start of Pharmacological VTE agent (>24hrs) due to surgical blood loss or risk of bleeding:  yes

## 2016-06-17 NOTE — ED Notes (Signed)
Admitting MD at bedside.

## 2016-06-18 ENCOUNTER — Encounter (HOSPITAL_COMMUNITY): Payer: Self-pay | Admitting: Critical Care Medicine

## 2016-06-18 ENCOUNTER — Encounter (HOSPITAL_COMMUNITY)
Admission: EM | Disposition: A | Discharge status: Home / Self Care | Payer: Self-pay | Source: Home / Self Care | Attending: Neurological Surgery

## 2016-06-18 ENCOUNTER — Inpatient Hospital Stay (HOSPITAL_COMMUNITY): Payer: Managed Care, Other (non HMO)

## 2016-06-18 ENCOUNTER — Inpatient Hospital Stay (HOSPITAL_COMMUNITY): Payer: Managed Care, Other (non HMO) | Admitting: Critical Care Medicine

## 2016-06-18 HISTORY — PX: CRANIOTOMY: SHX93

## 2016-06-18 IMAGING — CT CT HEAD W/O CM
3 of 4 series · 16 of 47 positions shown, 19 images · non-contrast
Comparison: CT head [DATE]

CLINICAL DATA: Subdural hematoma

EXAM:
CT HEAD WITHOUT CONTRAST
TECHNIQUE: Contiguous axial images were obtained from the base of the skull
through the vertex without intravenous contrast.

[Series 201: head w/o, idose (1) · axial · non-contrast · 0.49mm/px · z∈[+63,+198]mm · 10 of 33 slices shown, 13 images]
[im 3/33  brain]
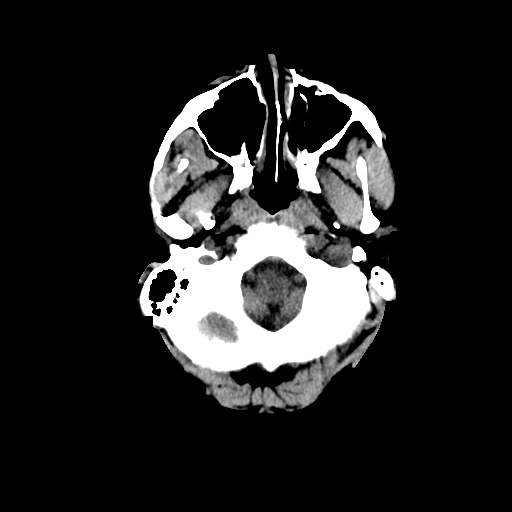
[im 3/33  bone]
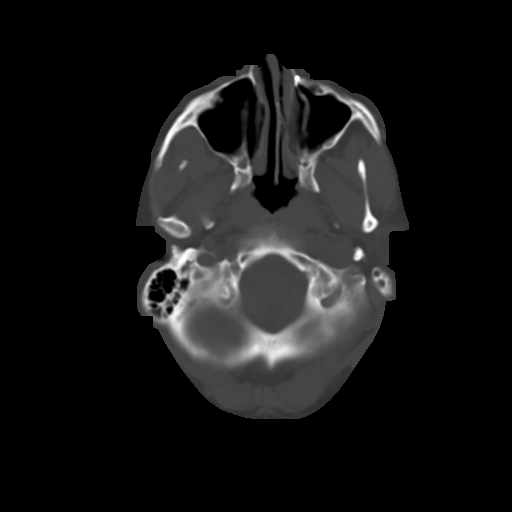
[im 5/33  brain]
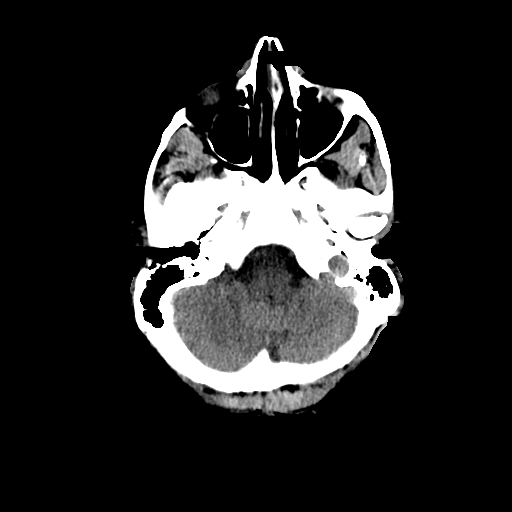
[im 10/33  brain]
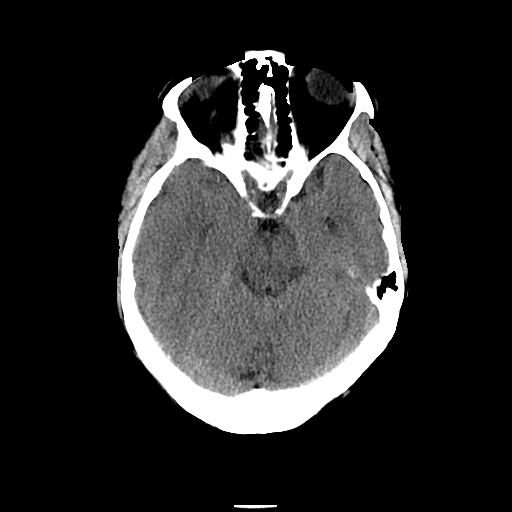
[im 12/33  brain]
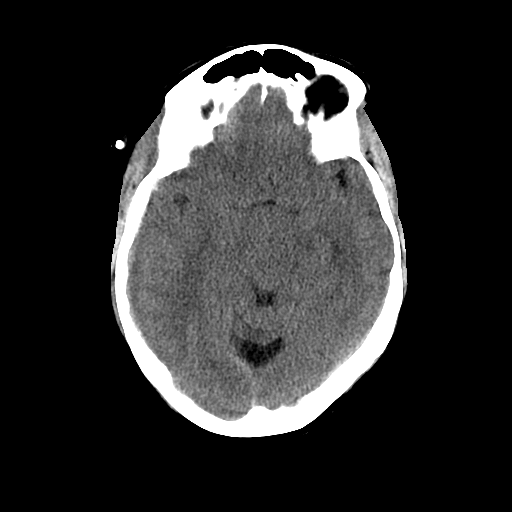
[im 14/33  brain]
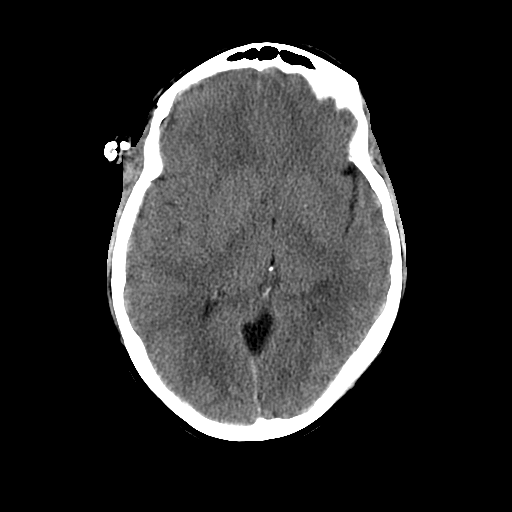
[im 14/33  bone]
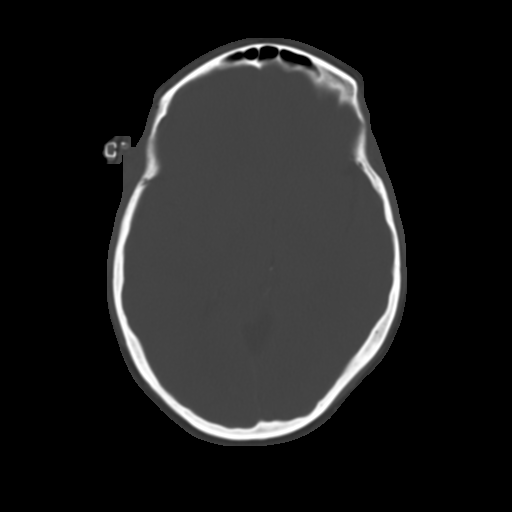
[im 19/33  brain]
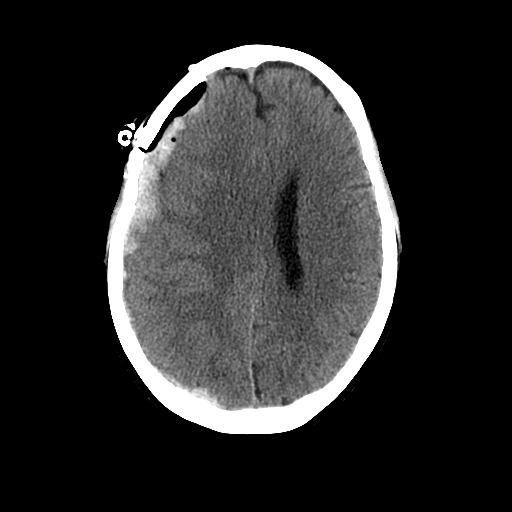
[im 21/33  brain]
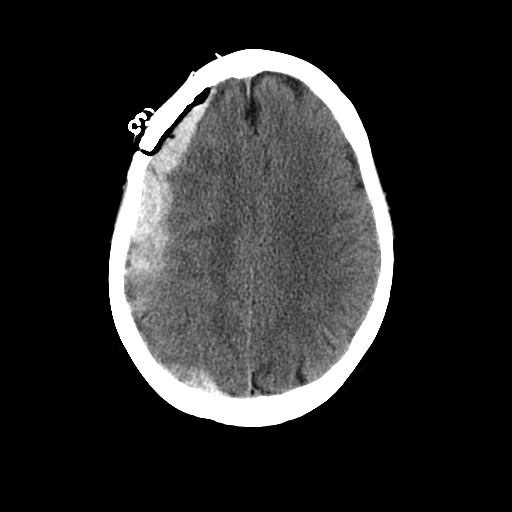
[im 23/33  brain]
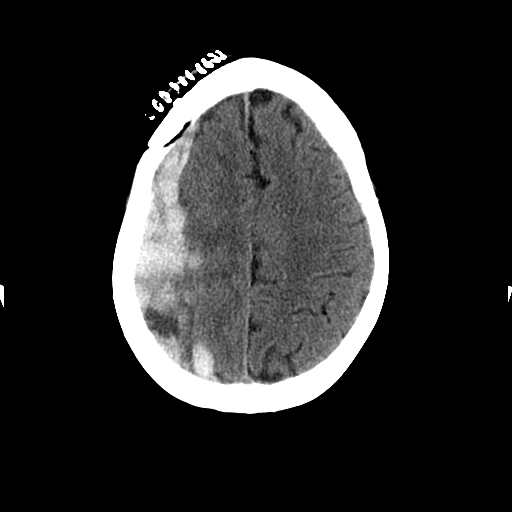
[im 28/33  brain]
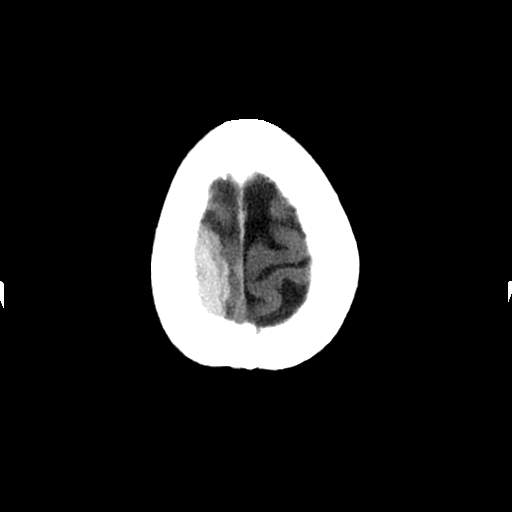
[im 28/33  bone]
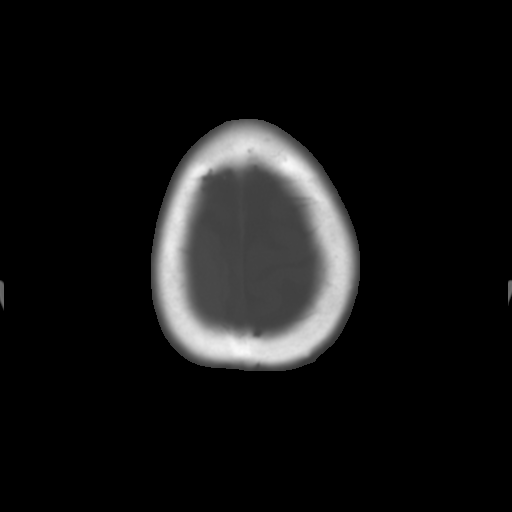
[im 30/33  brain]
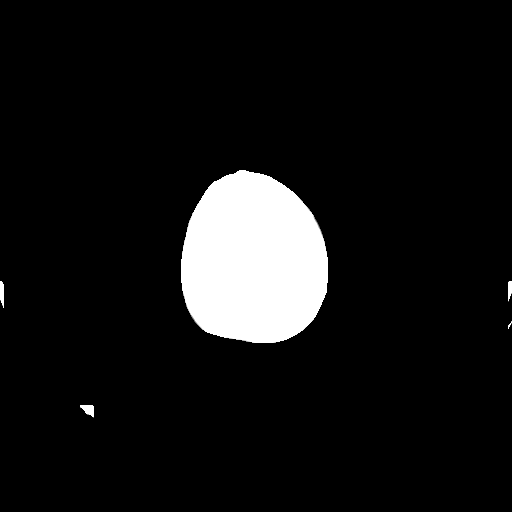

[Series 205: coronal st, idose (1) · coronal · 0.40mm/px · 3 of 68 slices shown]
[im 23/68  brain]
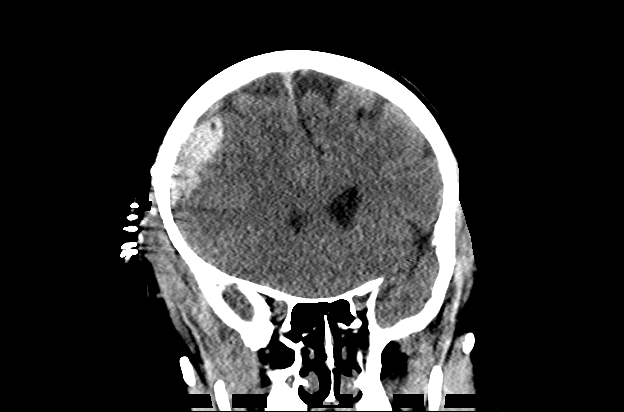
[im 30/68  brain]
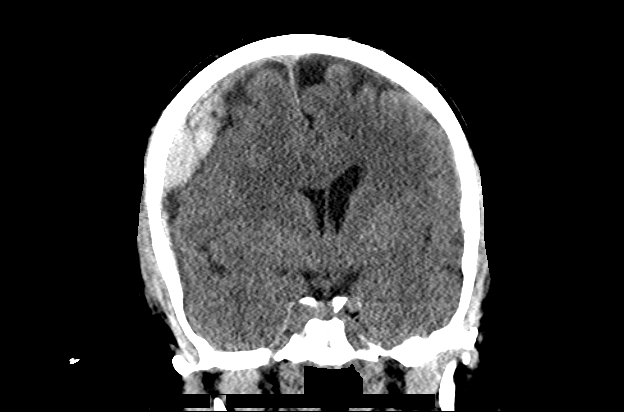
[im 38/68  brain]
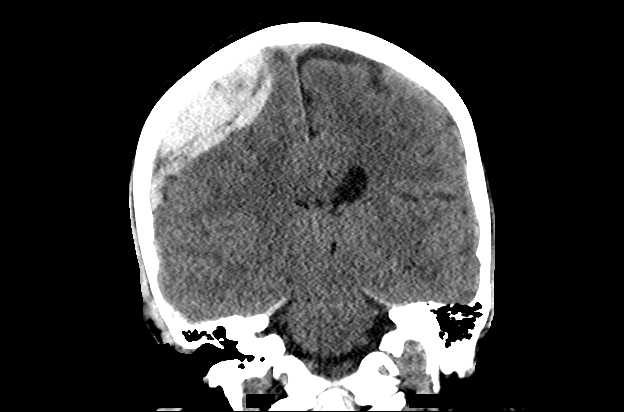

[Series 206: sagittal st, idose (1) · sagittal · 0.40mm/px · 3 of 52 slices shown]
[im 18/52  brain]
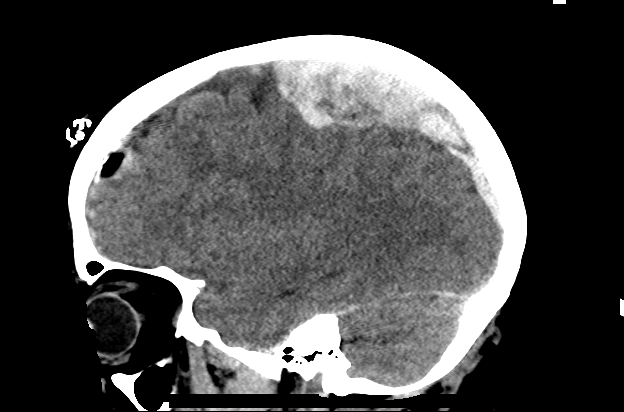
[im 26/52  brain]
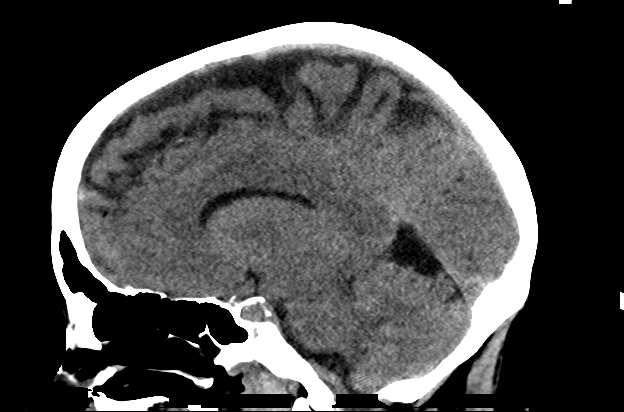
[im 35/52  brain]
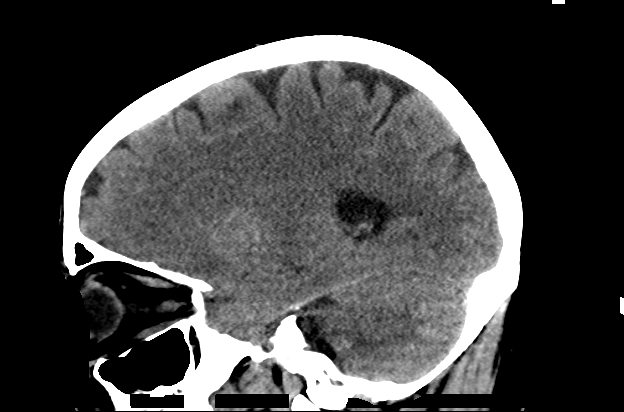

[16 of 47 positions shown; findings below may reference images not displayed]

FINDINGS: Brain: Right frontal craniotomy. Subdural drain has been removed.
Subcutaneous drain remains in place overlying the skull flap.

Mild pneumocephalus anteriorly. Residual high density subdural
hematoma is unchanged. This is most prominent over the convexity in
the parietal region were it measures approximately 21 mm in
thickness on coronal imaging, unchanged. There is significant
mass-effect on the underlying brain. Small subdural hematoma extends
along the orbital roof on the right. No new hemorrhage.

8 mm midline shift is unchanged. Negative for hydrocephalus. No
acute ischemic infarct.

Vascular: Negative for dense MCA

Skull: Right frontal craniotomy.

Sinuses/Orbits: Wires in the maxillary sinus from prior surgery
bilaterally.

Other: None
IMPRESSION: Large high density subdural hematoma over the right convexity is
unchanged. Subdural drain has been removed in the interval.

8 mm midline shift to the left is unchanged from earlier today.

## 2016-06-18 IMAGING — CT CT HEAD W/O CM
4 series · 16 of 47 positions shown, 18 images · non-contrast
Comparison: [DATE]

CLINICAL DATA: Followup subdural hematoma.

EXAM:
CT HEAD WITHOUT CONTRAST
TECHNIQUE: Contiguous axial images were obtained from the base of the skull
through the vertex without intravenous contrast.

[Series 2: head without · axial · non-contrast · 0.43mm/px · z∈[+467,+602]mm · 7 of 37 slices shown, 9 images]
[im 5/37  brain]
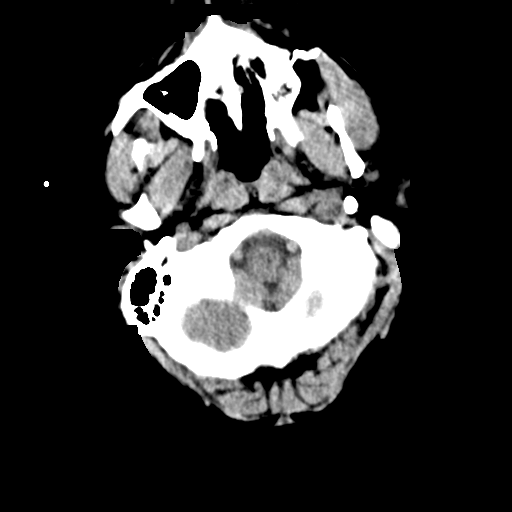
[im 5/37  bone]
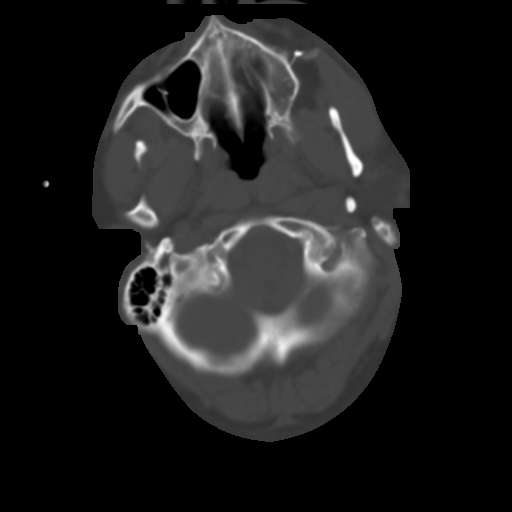
[im 10/37  brain]
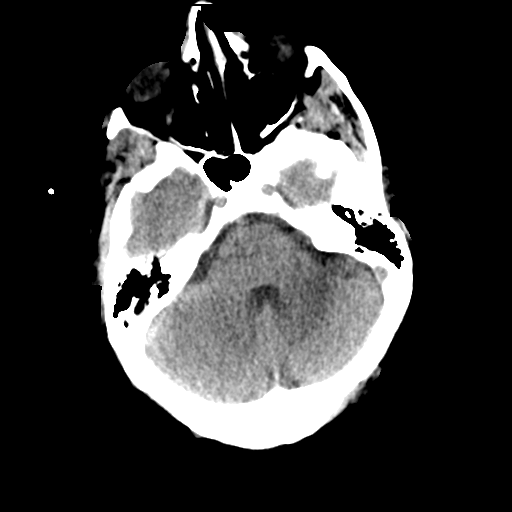
[im 14/37  brain]
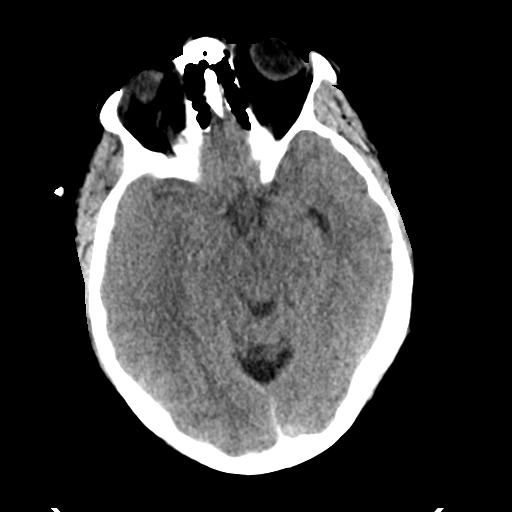
[im 19/37  brain]
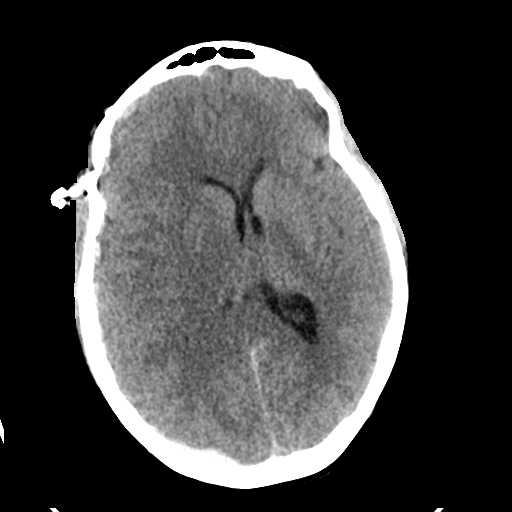
[im 23/37  brain]
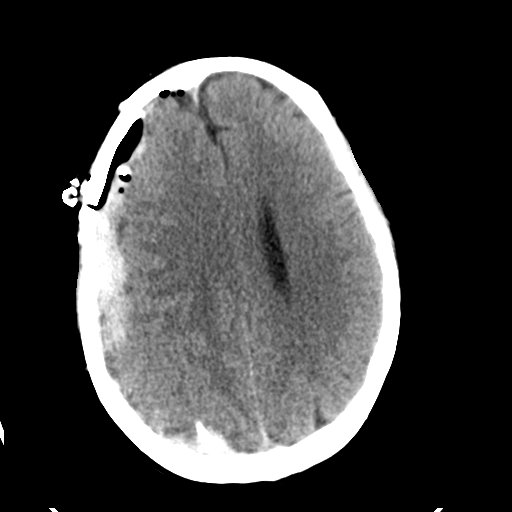
[im 23/37  bone]
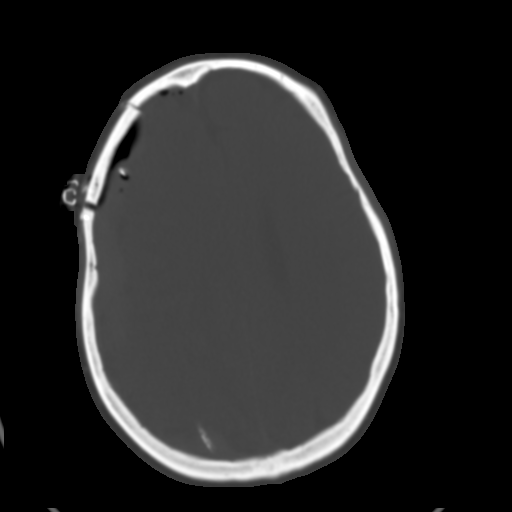
[im 28/37  brain]
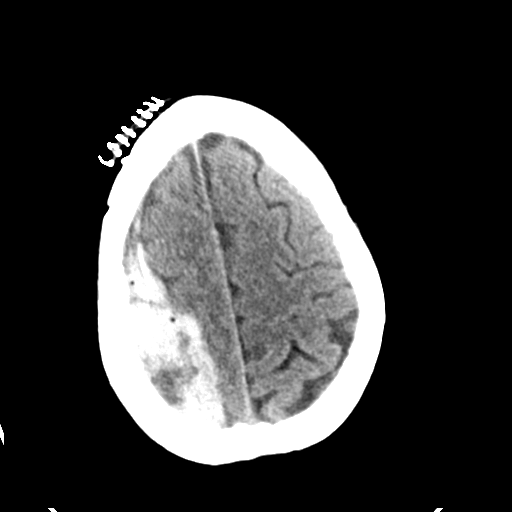
[im 32/37  brain]
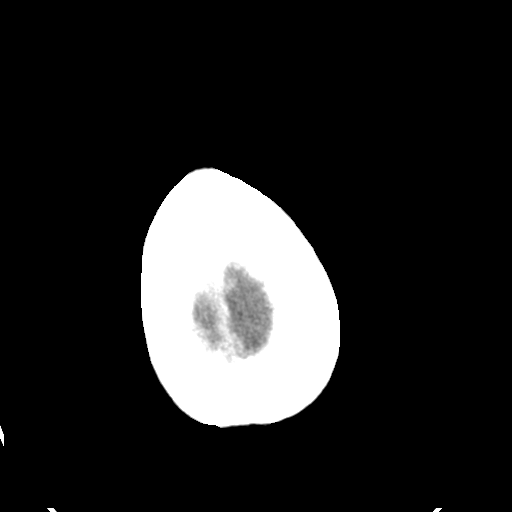

[Series 3: head bone · axial · 0.43mm/px · z∈[+465,+501]mm · 3 of 92 slices shown]
[im 10/92  bone]
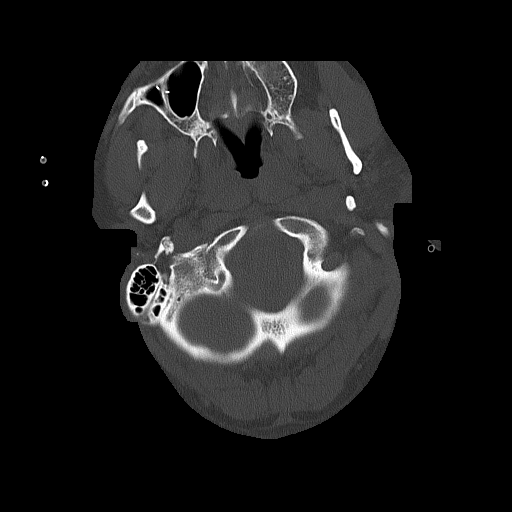
[im 19/92  bone]
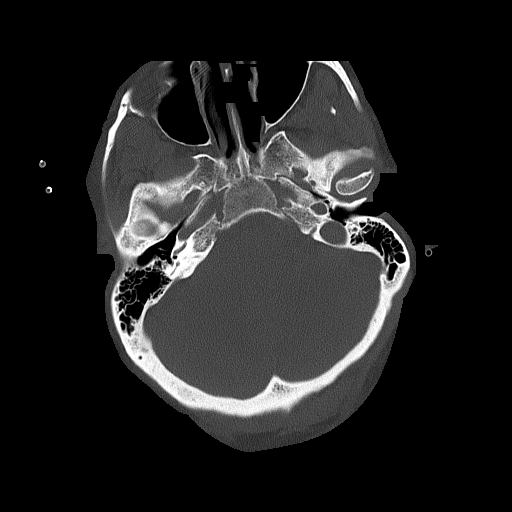
[im 28/92  bone]
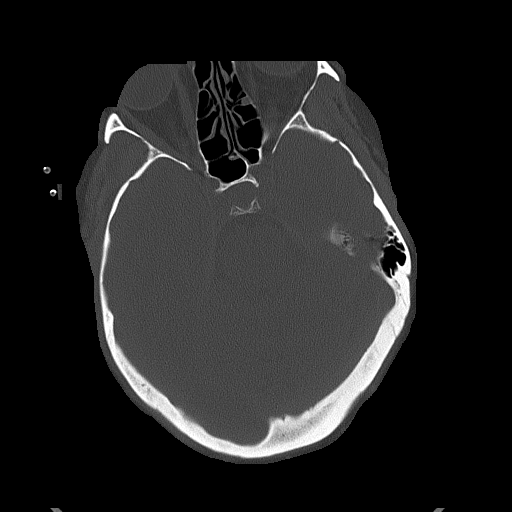

[Series 4: head without cor · coronal · non-contrast · 0.36mm/px · 3 of 72 slices shown]
[im 24/72  brain]
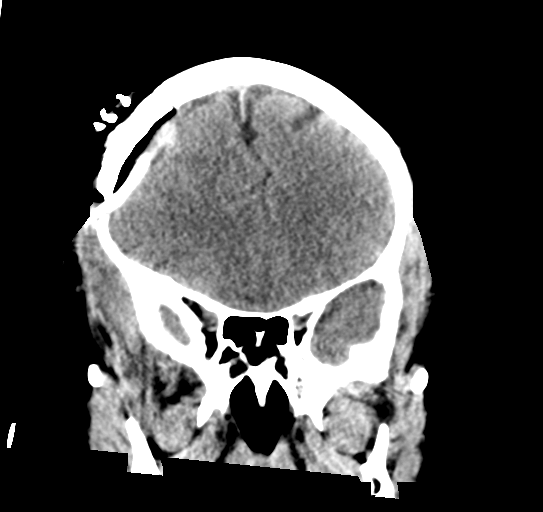
[im 32/72  brain]
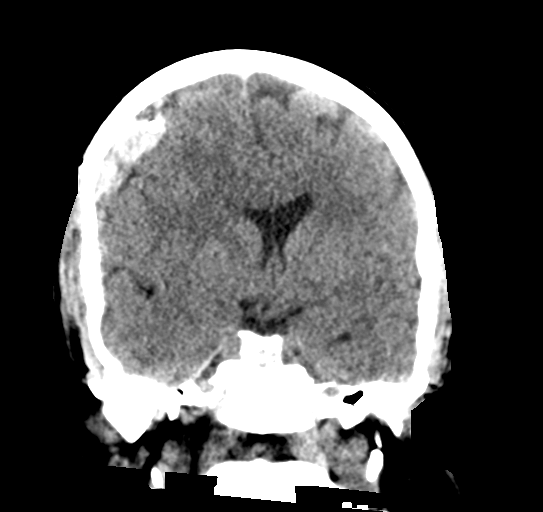
[im 40/72  brain]
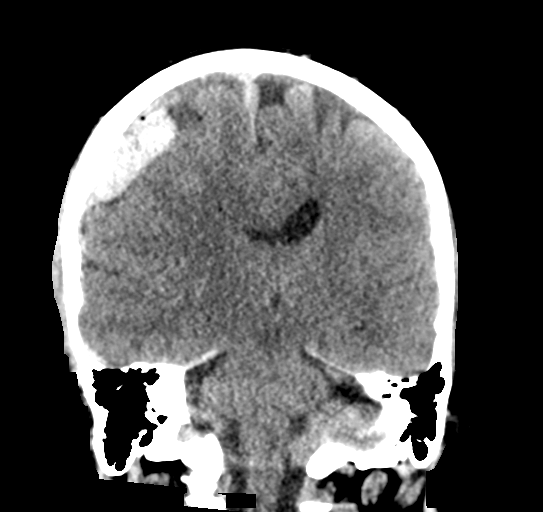

[Series 5: head without sag · sagittal · non-contrast · 0.40mm/px · 3 of 67 slices shown]
[im 27/67  brain]
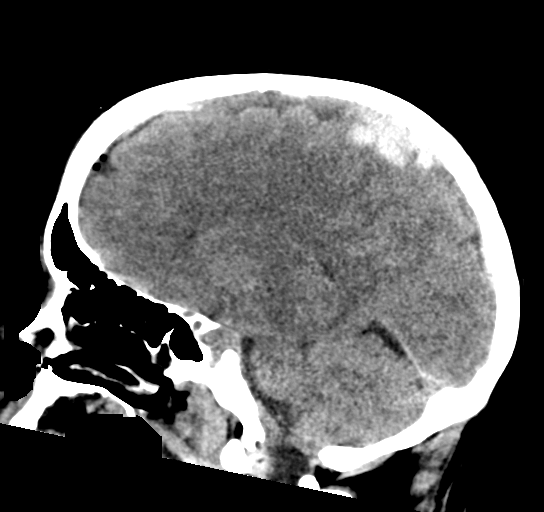
[im 34/67  brain]
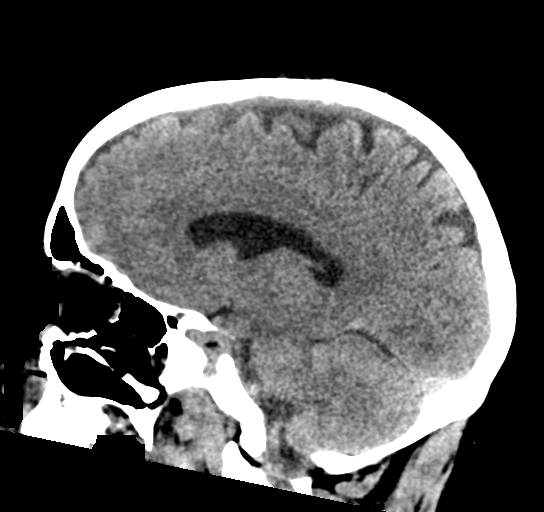
[im 41/67  brain]
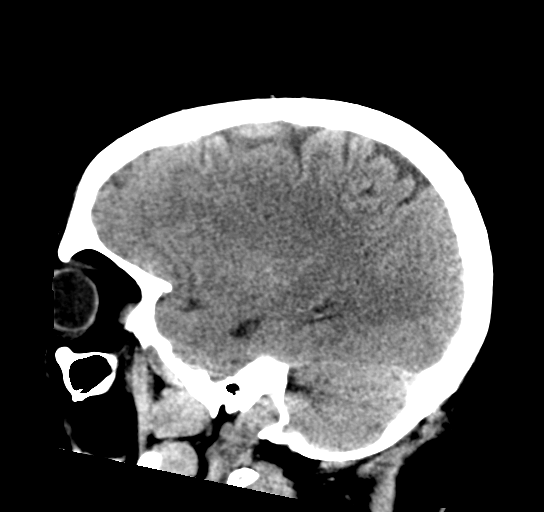

[16 of 47 positions shown; findings below may reference images not displayed]

FINDINGS: Brain: Interval right frontal craniotomy for subdural evacuation.
Subdural drain is in place running from front to back. Previously
seen next density but predominantly low-density subdural is thin
error in the frontal region, but at the right parietal vertex shows
the presence of acute hemorrhage, now hyper dense. In the posterior
aspect only, there is slightly more mass effect with indentation of
the brain. Maximal thickness is 22 mm in that region compared to 20
mm previously. Overall, the degree of mass effect is less with
right-to-left shift of 7.4 mm as opposed to 13 mm yesterday. Small
amount of subdural blood is no evident along both sides of the falx.
No intraparenchymal hemorrhage. No sign of ischemic infarction.

Vascular: No acute vascular finding.

Skull: Postoperative changes.  No unexpected finding.

Sinuses/Orbits: Negative

Other: None significant
IMPRESSION: Interval right frontal craniotomy for right subdural evacuation.
Subdural drain in place. There has been acute hemorrhage into the
subdural collection which is previously primarily low-density. In
general, there is less mass effect with right-to-left shift reduced
from 13 mm to 17 mm. The collection is slightly bulky air at the
posterior superior aspect, measuring 22 mm as opposed to 20 mm in
that region.

I am in the process of calling this report.

## 2016-06-18 SURGERY — CRANIOTOMY HEMATOMA EVACUATION SUBDURAL
Anesthesia: General | Site: Head | Laterality: Right

## 2016-06-18 MED ORDER — SUCCINYLCHOLINE CHLORIDE 200 MG/10ML IV SOSY
PREFILLED_SYRINGE | INTRAVENOUS | Status: AC
  Filled 2016-06-18: qty 20

## 2016-06-18 MED ORDER — ARTIFICIAL TEARS OP OINT
TOPICAL_OINTMENT | OPHTHALMIC | Status: AC
  Filled 2016-06-18: qty 7

## 2016-06-18 MED ORDER — FENTANYL CITRATE (PF) 100 MCG/2ML IJ SOLN
INTRAMUSCULAR | Status: DC | PRN
  Administered 2016-06-18: 25 ug via INTRAVENOUS
  Administered 2016-06-18 (×2): 50 ug via INTRAVENOUS

## 2016-06-18 MED ORDER — PROMETHAZINE HCL 25 MG/ML IJ SOLN
12.5000 mg | Freq: Four times a day (QID) | INTRAMUSCULAR | Status: DC | PRN
  Administered 2016-06-18: 12.5 mg via INTRAVENOUS
  Filled 2016-06-18: qty 1

## 2016-06-18 MED ORDER — HEMOSTATIC AGENTS (NO CHARGE) OPTIME
TOPICAL | Status: DC | PRN
  Administered 2016-06-18: 1 via TOPICAL

## 2016-06-18 MED ORDER — THROMBIN 5000 UNITS EX SOLR
CUTANEOUS | Status: AC
  Filled 2016-06-18: qty 5000

## 2016-06-18 MED ORDER — CEFAZOLIN SODIUM 1 G IJ SOLR
INTRAMUSCULAR | Status: AC
  Filled 2016-06-18: qty 20

## 2016-06-18 MED ORDER — FENTANYL CITRATE (PF) 100 MCG/2ML IJ SOLN: 25.0000 ug | INTRAMUSCULAR | Status: DC | PRN

## 2016-06-18 MED ORDER — LIDOCAINE 2% (20 MG/ML) 5 ML SYRINGE
INTRAMUSCULAR | Status: AC
  Filled 2016-06-18: qty 5

## 2016-06-18 MED ORDER — BACITRACIN ZINC 500 UNIT/GM EX OINT
TOPICAL_OINTMENT | CUTANEOUS | Status: DC | PRN
  Administered 2016-06-18: 1 via TOPICAL

## 2016-06-18 MED ORDER — 0.9 % SODIUM CHLORIDE (POUR BTL) OPTIME
TOPICAL | Status: DC | PRN
  Administered 2016-06-18 (×2): 1000 mL

## 2016-06-18 MED ORDER — DEXAMETHASONE SODIUM PHOSPHATE 10 MG/ML IJ SOLN
INTRAMUSCULAR | Status: AC
  Filled 2016-06-18: qty 1

## 2016-06-18 MED ORDER — INFLUENZA VAC SPLIT QUAD 0.5 ML IM SUSY: 0.5000 mL | PREFILLED_SYRINGE | INTRAMUSCULAR | Status: DC

## 2016-06-18 MED ORDER — PHENYLEPHRINE 40 MCG/ML (10ML) SYRINGE FOR IV PUSH (FOR BLOOD PRESSURE SUPPORT)
PREFILLED_SYRINGE | INTRAVENOUS | Status: AC
  Filled 2016-06-18: qty 30

## 2016-06-18 MED ORDER — FENTANYL CITRATE (PF) 100 MCG/2ML IJ SOLN
INTRAMUSCULAR | Status: AC
  Filled 2016-06-18: qty 4

## 2016-06-18 MED ORDER — THROMBIN 20000 UNITS EX SOLR
CUTANEOUS | Status: AC
  Filled 2016-06-18: qty 20000

## 2016-06-18 MED ORDER — ONDANSETRON HCL 4 MG/2ML IJ SOLN
INTRAMUSCULAR | Status: AC
  Filled 2016-06-18: qty 2

## 2016-06-18 MED ORDER — PROMETHAZINE HCL 25 MG/ML IJ SOLN: 6.2500 mg | INTRAMUSCULAR | Status: DC | PRN

## 2016-06-18 MED ORDER — DEXAMETHASONE SODIUM PHOSPHATE 10 MG/ML IJ SOLN
INTRAMUSCULAR | Status: DC | PRN
  Administered 2016-06-18: 10 mg via INTRAVENOUS

## 2016-06-18 MED ORDER — PHENYLEPHRINE 40 MCG/ML (10ML) SYRINGE FOR IV PUSH (FOR BLOOD PRESSURE SUPPORT)
PREFILLED_SYRINGE | INTRAVENOUS | Status: DC | PRN
  Administered 2016-06-18: 80 ug via INTRAVENOUS
  Administered 2016-06-18: 40 ug via INTRAVENOUS
  Administered 2016-06-18 (×2): 80 ug via INTRAVENOUS
  Administered 2016-06-18: 120 ug via INTRAVENOUS

## 2016-06-18 MED ORDER — SODIUM CHLORIDE 0.9 % IR SOLN
Status: DC | PRN
  Administered 2016-06-18: 22:00:00

## 2016-06-18 MED ORDER — SUCCINYLCHOLINE CHLORIDE 200 MG/10ML IV SOSY
PREFILLED_SYRINGE | INTRAVENOUS | Status: AC
  Filled 2016-06-18: qty 10

## 2016-06-18 MED ORDER — POTASSIUM CHLORIDE IN NACL 20-0.9 MEQ/L-% IV SOLN: INTRAVENOUS | Status: DC

## 2016-06-18 MED ORDER — EPHEDRINE 5 MG/ML INJ
INTRAVENOUS | Status: AC
  Filled 2016-06-18: qty 10

## 2016-06-18 MED ORDER — THROMBIN 20000 UNITS EX SOLR
CUTANEOUS | Status: DC | PRN
  Administered 2016-06-18 (×2): via TOPICAL

## 2016-06-18 MED ORDER — SUCCINYLCHOLINE CHLORIDE 200 MG/10ML IV SOSY
PREFILLED_SYRINGE | INTRAVENOUS | Status: DC | PRN
  Administered 2016-06-18: 100 mg via INTRAVENOUS

## 2016-06-18 MED ORDER — GELATIN ABSORBABLE MT POWD
OROMUCOSAL | Status: DC | PRN
  Administered 2016-06-18 (×2): via TOPICAL

## 2016-06-18 MED ORDER — LIDOCAINE HCL (CARDIAC) 20 MG/ML IV SOLN
INTRAVENOUS | Status: DC | PRN
  Administered 2016-06-18: 80 mg via INTRAVENOUS

## 2016-06-18 MED ORDER — PROPOFOL 10 MG/ML IV BOLUS
INTRAVENOUS | Status: DC | PRN
  Administered 2016-06-18: 20 mg via INTRAVENOUS
  Administered 2016-06-18: 100 mg via INTRAVENOUS

## 2016-06-18 MED ORDER — ROCURONIUM BROMIDE 10 MG/ML (PF) SYRINGE
PREFILLED_SYRINGE | INTRAVENOUS | Status: AC
  Filled 2016-06-18: qty 30

## 2016-06-18 MED ORDER — LIDOCAINE-EPINEPHRINE 1 %-1:100000 IJ SOLN
INTRAMUSCULAR | Status: AC
  Filled 2016-06-18: qty 1

## 2016-06-18 MED ORDER — ONDANSETRON HCL 4 MG/2ML IJ SOLN
INTRAMUSCULAR | Status: DC | PRN
  Administered 2016-06-18: 4 mg via INTRAVENOUS

## 2016-06-18 MED ORDER — DEXTROSE 5 % IV SOLN
INTRAVENOUS | Status: DC | PRN
  Administered 2016-06-18: 25 ug/min via INTRAVENOUS

## 2016-06-18 MED ORDER — BACITRACIN ZINC 500 UNIT/GM EX OINT
TOPICAL_OINTMENT | CUTANEOUS | Status: AC
  Filled 2016-06-18: qty 28.35

## 2016-06-18 MED ORDER — SODIUM CHLORIDE 0.9 % IV SOLN
INTRAVENOUS | Status: DC | PRN
  Administered 2016-06-18: 21:00:00 via INTRAVENOUS

## 2016-06-18 MED ORDER — PROPOFOL 10 MG/ML IV BOLUS
INTRAVENOUS | Status: AC
  Filled 2016-06-18: qty 20

## 2016-06-18 MED ORDER — CEFAZOLIN SODIUM 1 G IJ SOLR
INTRAMUSCULAR | Status: DC | PRN
  Administered 2016-06-18: 2 g via INTRAMUSCULAR

## 2016-06-18 SURGICAL SUPPLY — 41 items
BAG DECANTER FOR FLEXI CONT (MISCELLANEOUS) ×3 IMPLANT
CANISTER SUCT 3000ML PPV (MISCELLANEOUS) ×3 IMPLANT
DRAIN HEMOVAC 1/8 X 5 (WOUND CARE) ×3 IMPLANT
DRAPE NEUROLOGICAL W/INCISE (DRAPES) ×3 IMPLANT
DRAPE WARM FLUID 44X44 (DRAPE) ×3 IMPLANT
DURAMATRIX ONLAY 2X2 (Neuro Prosthesis/Implant) ×3 IMPLANT
DURAPREP 6ML APPLICATOR 50/CS (WOUND CARE) ×3 IMPLANT
ELECT REM PT RETURN 9FT ADLT (ELECTROSURGICAL) ×3
ELECTRODE REM PT RTRN 9FT ADLT (ELECTROSURGICAL) ×1 IMPLANT
EVACUATOR 3/16  PVC DRAIN (DRAIN) ×2
EVACUATOR 3/16 PVC DRAIN (DRAIN) ×1 IMPLANT
EVACUATOR SILICONE 100CC (DRAIN) ×3 IMPLANT
GAUZE SPONGE 4X4 12PLY STRL (GAUZE/BANDAGES/DRESSINGS) ×3 IMPLANT
GLOVE BIO SURGEON STRL SZ7 (GLOVE) ×3 IMPLANT
GLOVE BIO SURGEON STRL SZ7.5 (GLOVE) ×3 IMPLANT
GLOVE BIO SURGEON STRL SZ8 (GLOVE) ×6 IMPLANT
GLOVE BIOGEL PI IND STRL 7.0 (GLOVE) ×1 IMPLANT
GLOVE BIOGEL PI IND STRL 7.5 (GLOVE) ×1 IMPLANT
GLOVE BIOGEL PI INDICATOR 7.0 (GLOVE) ×2
GLOVE BIOGEL PI INDICATOR 7.5 (GLOVE) ×2
GOWN STRL REUS W/ TWL LRG LVL3 (GOWN DISPOSABLE) IMPLANT
GOWN STRL REUS W/ TWL XL LVL3 (GOWN DISPOSABLE) ×2 IMPLANT
GOWN STRL REUS W/TWL 2XL LVL3 (GOWN DISPOSABLE) IMPLANT
GOWN STRL REUS W/TWL LRG LVL3 (GOWN DISPOSABLE)
GOWN STRL REUS W/TWL XL LVL3 (GOWN DISPOSABLE) ×4
HEMOSTAT POWDER KIT SURGIFOAM (HEMOSTASIS) ×6 IMPLANT
HEMOSTAT SURGICEL 2X14 (HEMOSTASIS) ×3 IMPLANT
KIT BASIN OR (CUSTOM PROCEDURE TRAY) ×3 IMPLANT
KIT ROOM TURNOVER OR (KITS) ×3 IMPLANT
NEEDLE HYPO 22GX1.5 SAFETY (NEEDLE) ×3 IMPLANT
NS IRRIG 1000ML POUR BTL (IV SOLUTION) ×6 IMPLANT
PACK CRANIOTOMY (CUSTOM PROCEDURE TRAY) ×3 IMPLANT
PAD ARMBOARD 7.5X6 YLW CONV (MISCELLANEOUS) ×3 IMPLANT
SPONGE SURGIFOAM ABS GEL 100 (HEMOSTASIS) ×6 IMPLANT
STAPLER VISISTAT 35W (STAPLE) ×3 IMPLANT
SUT NURALON 4 0 TR CR/8 (SUTURE) ×6 IMPLANT
SUT VIC AB 2-0 CP2 18 (SUTURE) ×3 IMPLANT
TAPE CLOTH SURG 4X10 WHT LF (GAUZE/BANDAGES/DRESSINGS) ×3 IMPLANT
TOWEL OR 17X24 6PK STRL BLUE (TOWEL DISPOSABLE) ×3 IMPLANT
TOWEL OR 17X26 10 PK STRL BLUE (TOWEL DISPOSABLE) ×3 IMPLANT
WATER STERILE IRR 1000ML POUR (IV SOLUTION) ×3 IMPLANT

## 2016-06-18 NOTE — Progress Notes (Signed)
Patient ID: Christina [name], female   DOB: 03-10-1961, 55 y.o.   MRN: [id number] This patient had a mild pronator drift on the left and therefore decided to repeat her head CT early. This shows continued accumulation of acute blood products in the right craniotomy evacuation site. I have talked to her husband and her at length and have recommended a redo craniotomy for evacuation of subdural hematoma. They understand the risks are similar to last night's operation. They include bleeding, infection, brain injury, stroke, reaccumulation of blood, need for further surgery, lack of relief of symptoms, worsening symptoms, neurologic deficit, and anesthesia risk and they agreed to proceed.

## 2016-06-18 NOTE — Anesthesia Procedure Notes (Signed)
Procedure Name: Intubation Date/Time: 06/18/2016 9:34 PM Performed by: Merrilyn Puma B Pre-anesthesia Checklist: Patient identified, Emergency Drugs available, Suction available, Patient being monitored and Timeout performed Patient Re-evaluated:Patient Re-evaluated prior to inductionOxygen Delivery Method: Circle system utilized Intubation Type: IV induction, Rapid sequence and Cricoid Pressure applied Laryngoscope Size: Mac and 3 Grade View: Grade III Tube type: Subglottic suction tube Tube size: 7.5 mm Number of attempts: 1 Airway Equipment and Method: Stylet Placement Confirmation: ETT inserted through vocal cords under direct vision,  positive ETCO2,  CO2 detector and breath sounds checked- equal and bilateral Secured at: 21 cm Tube secured with: Tape Dental Injury: Teeth and Oropharynx as per pre-operative assessment

## 2016-06-18 NOTE — Evaluation (Signed)
Physical Therapy Evaluation Patient Details Name: Christina [name] MRN: [id number] DOB: 1960-09-06 Today's Date: 06/18/2016   History of Present Illness  Patient is a 55 yo female admitted 06/17/16 with headache x 5 weeks.  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  Patient s/p craniotomy and evacuation of SDH on 06/17/16.     PMH:  Graves' Disease, HTN  Clinical Impression  Patient presents with problems listed below.  Will benefit from acute PT to maximize functional mobility prior to discharge home with family.  Recommend HHPT at d/c, however patient may progress to point of no f/u PT needed.  Will continue to assess.    Follow Up Recommendations Home health PT;Supervision for mobility/OOB    Equipment Recommendations  Other (comment) (TBD)    Recommendations for Other Services       Precautions / Restrictions Precautions Precautions: Fall Restrictions Weight Bearing Restrictions: No      Mobility  Bed Mobility Overal bed mobility: Needs Assistance;+ 2 for safety/equipment Bed Mobility: Supine to Sit     Supine to sit: Min assist;+2 for safety/equipment     General bed mobility comments: Verbal cues to move to EOB.  Repeated cueing to continue task.  Assist to scoot hips to EOB in sitting.  Patient with fair sitting balance once upright.  Transfers Overall transfer level: Needs assistance Equipment used: 2 person hand held assist Transfers: Sit to/from [facility] Sit to Stand: Min assist;Mod assist;+2 physical assistance Stand pivot transfers: Min assist;Mod assist;+2 physical assistance       General transfer comment: Verbal cues for technique to stand and pivot to [facility].  Assist to rise to standing and for balance.  Patient states she is lightheaded and loses balance posteriorly.  Mod assist to maintain balance.  Patient reaching back for [facility].  Drain tubing was short, got caught in patient's hand, and was pulled out.  RN notified.  After  voiding, patient able to stand for pericare.  Patient transferred [facility] to recliner.  Required repeated cueing for sequencing, and to wait until chair positioned.  Ambulation/Gait             General Gait Details: NT  Stairs            Wheelchair Mobility    Modified Rankin (Stroke Patients Only)       Balance Overall balance assessment: Needs assistance Sitting-balance support: No upper extremity supported;Feet supported Sitting balance-Leahy Scale: Fair     Standing balance support: Bilateral upper extremity supported Standing balance-Leahy Scale: Poor                               Pertinent Vitals/Pain Pain Assessment: 0-10 Pain Score: 5  Pain Location: headache Pain Descriptors / Indicators: Headache Pain Intervention(s): Limited activity within patient's tolerance;Monitored during session;Repositioned    Home Living Family/patient expects to be discharged to:: Private residence Living Arrangements: Spouse/significant other;Children Available Help at Discharge: Family;Available 24 hours/day Type of Home: House Home Access: Stairs to enter Entrance Stairs-Rails: None Entrance Stairs-Number of Steps: 2-3 Home Layout: Two level;Bed/bath upstairs Home Equipment: None      Prior Function Level of Independence: Independent         Comments: Works full time [employer] and exercise psychologist        Extremity/Trunk Assessment   Upper Extremity Assessment: Generalized weakness           Lower  Extremity Assessment: Generalized weakness         Communication   Communication: HOH (Wears hearing aids)  Cognition Arousal/Alertness: Awake/alert Behavior During Therapy: Flat affect;Impulsive Overall Cognitive Status: Impaired/Different from baseline Area of Impairment: Following commands;Safety/judgement;Problem solving       Following Commands: Follows one step commands inconsistently;Follows one step commands with increased  time Safety/Judgement: Decreased awareness of safety   Problem Solving: Slow processing;Difficulty sequencing;Requires verbal cues;Requires tactile cues      General Comments      Exercises     Assessment/Plan    PT Assessment Patient needs continued PT services  PT Problem List Decreased strength;Decreased activity tolerance;Decreased balance;Decreased mobility;Decreased cognition;Decreased knowledge of use of DME;Decreased safety awareness;Pain          PT Treatment Interventions DME instruction;Gait training;Stair training;Functional mobility training;Therapeutic activities;Therapeutic exercise;Balance training;Cognitive remediation;Patient/family education    PT Goals (Current goals can be found in the Care Plan section)  Acute Rehab PT Goals Patient Stated Goal: To return home PT Goal Formulation: With patient Time For Goal Achievement: 06/25/16 Potential to Achieve Goals: Good    Frequency Min 4X/week   Barriers to discharge        Co-evaluation               End of Session Equipment Utilized During Treatment: Gait belt Activity Tolerance: Patient limited by pain (Limited by lightheadedness) Patient left: in chair;with call bell/phone within reach;with family/visitor present Nurse Communication: Mobility status (Drain pulled out.  RN contacted MD)         [id number] PT Time Calculation (min) (ACUTE ONLY): 33 min   Charges:   PT Evaluation $PT Eval Moderate Complexity: 1 Procedure PT Treatments $Therapeutic Activity: 8-22 mins   PT G Codes:        Despina Pole 2016-07-15, 3:29 PM Carita Pian. Sanjuana Kava, [place] Pager [phone]

## 2016-06-18 NOTE — Progress Notes (Signed)
Paged MD regarding weakness on left side, received orders to move head ct up to now.

## 2016-06-18 NOTE — Progress Notes (Signed)
Patient ID: Christina [name], female   DOB: [birth year], 55 y.o.   MRN: [id number] CT of head reviewed. There is some acute blood in the craniotomy site but I expected this based on her surgery. Overall there is less mass effect and less shift. I do not think this is acute blood under pressure and she looks good neurologically so I think I will simply watch this for now and repeat her head CT tomorrow instead of repeat evacuation.

## 2016-06-18 NOTE — Op Note (Signed)
06/17/2016 - 06/18/2016  11:08 PM  PATIENT:  Christina [name]  55 y.o. female  PRE-OPERATIVE DIAGNOSIS:  Recurrent right acute subdural hematoma  POST-OPERATIVE DIAGNOSIS:  Same  PROCEDURE:  Repeat right craniotomy for evacuation of acute subdural hematoma  SURGEON:  Sherley Bounds, MD  ASSISTANTS: none  ANESTHESIA:   General  EBL: 75 ml  Total I/O In: 400 [I.V.:400] Out: 75 [Blood:75]  BLOOD ADMINISTERED:none  DRAINS: Medium Hemovac in subgaleal space and flat JP and subdural space   SPECIMEN:  No Specimen  INDICATION FOR PROCEDURE: This patient underwent a right craniotomy for a chronic subdural hematoma yesterday. Repeat CT scan today showed an enlarging right acute subdural hematoma. Recommended reexploration for evacuation. Patient understood the risks, benefits, and alternatives and potential outcomes and wished to proceed.  PROCEDURE DETAILS: The patient was taken to the operating room and after induction of adequate or lasting endotracheal anesthesia she was placed in the supine position on the operating table. Her right frontal region was cleaned and the old drain was removed and the staples were removed. The site was prepped with DuraPrep and then draped in usual sterile fashion. Her old incision was opened and lying bone flap. The bone flap was removed. Gelfoam was removed. A sizable right acute subdural hematoma was found. It was jellylike. This was removed with suction. This extended from the frontal region all the way back to the occipital region. I used a brain ribbon to gently push the parietal lobe down slightly in order to use suction to suck away the acute subdural hematoma. I was careful not to use too much pressure. I was careful not to manipulate the parietal lobe anymore than necessary. There was a large posterior parietal hematoma with a membrane. A opened the membrane and then removed as much of the hematomas felt like I could safely. This was out of reach from the  frontal region. Therefore I felt this was better controlled with Surgifoam and with slight packing. There was oozing from the thick membrane that was residual from the chronic subdural hematoma. This was controlled with bipolar cautery and with Surgifoam and with irrigation and time and pressure. I spent considerable time Drying the surgical bed with Surgifoam and with irrigant. We continued to watch over time to make sure there was no accumulation. There was none. I therefore placed a dura matrix and Gelfoam over the craniotomy site. A 7 flat JP drain was placed in the subdural space. The bone flap was replaced with the dog bone plates. A subgaleal drain was placed. The galea was then closed with interrupted 2-0 Vicryl. The skin was closed with staples. A sterile dressing was applied. The patient was then awakened from general anesthesia and transferred to her room in stable condition. At the end of the procedure all sponge needle and instrument counts were correct.  PLAN OF CARE: Admit to inpatient   PATIENT DISPOSITION:  PACU - hemodynamically stable.   Delay start of Pharmacological VTE agent (>24hrs) due to surgical blood loss or risk of bleeding:  yes

## 2016-06-18 NOTE — Transfer of Care (Signed)
Immediate Anesthesia Transfer of Care Note  Patient: Christina [name]  Procedure(s) Performed: Procedure(s): CRANIOTOMY HEMATOMA EVACUATION SUBDURAL RIGHT (Right)  Patient Location: PACU  Anesthesia Type:General  Level of Consciousness: awake and alert   Airway & Oxygen Therapy: Patient Spontanous Breathing and Patient connected to nasal cannula oxygen  Post-op Assessment: Report given to RN and Post -op Vital signs reviewed and stable  Post vital signs: Reviewed and stable  Last Vitals:  Vitals:   06/18/16 2000 06/18/16 2100  BP: 130/69 135/68  Pulse: 72 82  Resp: 15 17  Temp: 36.9 C     Last Pain:  Vitals:   06/18/16 2000  TempSrc: Axillary  PainSc:       Patients Stated Pain Goal: 3 ([id number])  Complications: No apparent anesthesia complications

## 2016-06-18 NOTE — Progress Notes (Signed)
Patient ID: SHAHLA [name], female   DOB: Sep 11, 1960, 55 y.o.   MRN: [id number] Subjective: Patient reports no pain  Objective: Vital signs in last 24 hours: Temp:  [97.2 F (36.2 C)-98.1 F (36.7 C)] 97.6 F (36.4 C) (11/11 0400) Pulse Rate:  [49-97] 64 (11/11 0400) Resp:  [12-30] 14 (11/11 0400) BP: (105-158)/(62-85) 110/65 (11/11 0400) SpO2:  [90 %-100 %] 96 % (11/11 0400) Arterial Line BP: (106-162)/(47-69) 122/49 (11/11 0400) Weight:  [81.6 kg (180 lb)] 81.6 kg (180 lb) (11/10 1127)  Intake/Output from previous day: 11/10 0701 - 11/11 0700 In: 1980 [I.V.:1775; IV Piggyback:205] Out: 643 [Urine:450; Drains:93; Blood:100] Intake/Output this shift: Total I/O In: 880 [I.V.:675; IV Piggyback:205] Out: 450 [Urine:450]  Neurologic: Grossly normal  Lab Results: Lab Results  Component Value Date   WBC 6.7 06/17/2016   HGB 11.1 (L) 06/17/2016   HCT 33.1 (L) 06/17/2016   MCV 92.2 06/17/2016   PLT 236 06/17/2016   Lab Results  Component Value Date   INR 0.90 06/17/2016   BMET Lab Results  Component Value Date   NA 137 06/17/2016   K 4.0 06/17/2016   CL 106 06/17/2016   CO2 23 06/17/2016   GLUCOSE 104 (H) 06/17/2016   BUN 15 06/17/2016   CREATININE 0.80 06/17/2016   CALCIUM 9.1 06/17/2016    Studies/Results: Dg Chest 2 View  Result Date: 06/17/2016 CLINICAL DATA:  Preoperative chest x-ray . EXAM: CHEST  2 VIEW COMPARISON:  06/29/2015. FINDINGS: Mediastinum and hilar structures are normal. Mild bibasilar atelectasis, right side greater than left. Cardiomegaly with normal pulmonary vascularity. No pleural effusion or pneumothorax. Stable elevation right hemidiaphragm. IMPRESSION: 1. Mild bibasilar atelectasis, right side greater than left. Stable elevation right hemidiaphragm. 2. Cardiomegaly.  No pulmonary venous congestion. Electronically Signed   By: Marcello Moores  Register   On: 06/17/2016 13:34   Ct Head Wo Contrast  Result Date: 06/17/2016 CLINICAL DATA:  Persistent  headache, fever EXAM: CT HEAD WITHOUT CONTRAST TECHNIQUE: Contiguous axial images were obtained from the base of the skull through the vertex without intravenous contrast. COMPARISON:  CT orbit of 03/19/2005 FINDINGS: Brain: There is a chronic right subdural hematoma with some acute components. This hematoma measures 2.1 cm in depth on image 23. There is compression upon the right frontotemporal region with shift of the midline septum to the left by approximately 13 mm. No evidence of infarction or intraparenchymal hemorrhage is seen. Vascular: No vascular abnormality is seen on this unenhanced study. Skull: No calvarial abnormality is noted. Sinuses/Orbits: The paranasal sinuses appear pneumatized. The orbital rims are intact. Other: None IMPRESSION: 1. 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 2. No acute intraparenchymal abnormality. Electronically Signed   By: Ivar Drape M.D.   On: 06/17/2016 10:12   Ct Maxillofacial Limited Wo Contrast  Result Date: 06/17/2016 CLINICAL DATA:  Facial pain and pressure for several weeks, persistent headache EXAM: CT PARANASAL SINUS LIMITED WITHOUT CONTRAST TECHNIQUE: Non-contiguous multidetector CT images of the paranasal sinuses were obtained in a single plane without contrast. COMPARISON:  CT brain scan of 08/08/2014 FINDINGS: No definite sinusitis is seen. Only mild mucosal thickening is present in the floor of the right maxillary sinus. Sutures are noted in the floor both maxillary sinuses from prior surgical intervention. The medial wall of the maxillary sinus appears to be absent, and the patient may have undergone  medial antrostomy on the right. The nasal turbinates are normal in size and position and the nasal airway is patent. No bony abnormality is seen. No soft tissue abnormality is evident. IMPRESSION: 1. No definite  sinusitis. Mild mucosal thickening in the floor of the right maxillary sinus. 2. Question of prior right medial antrostomy with apparent absence of the medial wall of the right maxillary sinus. Electronically Signed   By: Ivar Drape M.D.   On: 06/17/2016 10:06    Assessment/Plan: Patient looks good postoperative day 1. Follow-up CT in a few minutes.   LOS: 1 day    Ayumi Wangerin S 06/18/2016, 5:40 AM

## 2016-06-18 NOTE — Anesthesia Preprocedure Evaluation (Signed)
Anesthesia Evaluation  Patient identified by MRN, date of birth, ID band Patient awake    Reviewed: Allergy & Precautions, NPO status , Patient's Chart, lab work & pertinent test resultsPreop documentation limited or incomplete due to emergent nature of procedure.  History of Anesthesia Complications Negative for: history of anesthetic complications  Airway Mallampati: II  TM Distance: >3 FB Neck ROM: Full    Dental no notable dental hx. (+) Dental Advisory Given   Pulmonary neg pulmonary ROS,    Pulmonary exam normal breath sounds clear to auscultation       Cardiovascular hypertension, Pt. on medications Normal cardiovascular exam Rhythm:Regular Rate:Normal     Neuro/Psych Subdural hematoma negative psych ROS   GI/Hepatic negative GI ROS, Neg liver ROS,   Endo/Other  Hypothyroidism   Renal/GU negative Renal ROS     Musculoskeletal   Abdominal   Peds  Hematology  (+) Blood dyscrasia, anemia , Leukemia   Anesthesia Other Findings   Reproductive/Obstetrics                             Anesthesia Physical  Anesthesia Plan  ASA: III and emergent  Anesthesia Plan: General   Post-op Pain Management:    Induction: Intravenous, Rapid sequence and Cricoid pressure planned  Airway Management Planned: Oral ETT  Additional Equipment: Arterial line  Intra-op Plan:   Post-operative Plan: Possible Post-op intubation/ventilation  Informed Consent: I have reviewed the patients History and Physical, chart, labs and discussed the procedure including the risks, benefits and alternatives for the proposed anesthesia with the patient or authorized representative who has indicated his/her understanding and acceptance.   Dental advisory given  Plan Discussed with: Anesthesiologist and CRNA  Anesthesia Plan Comments: (Brought emergently to OR. ROS conducted during patient transport.)         Anesthesia Quick Evaluation

## 2016-06-19 MED ORDER — DEXAMETHASONE SODIUM PHOSPHATE 10 MG/ML IJ SOLN
6.0000 mg | Freq: Four times a day (QID) | INTRAMUSCULAR | Status: AC
  Administered 2016-06-19 (×4): 6 mg via INTRAVENOUS
  Filled 2016-06-19 (×4): qty 1

## 2016-06-19 MED ORDER — DEXAMETHASONE SODIUM PHOSPHATE 4 MG/ML IJ SOLN
4.0000 mg | Freq: Four times a day (QID) | INTRAMUSCULAR | Status: AC
  Administered 2016-06-20 – 2016-06-21 (×4): 4 mg via INTRAVENOUS
  Filled 2016-06-19 (×4): qty 1

## 2016-06-19 MED ORDER — SODIUM CHLORIDE 0.9 % IV SOLN
500.0000 mg | Freq: Two times a day (BID) | INTRAVENOUS | Status: DC
  Administered 2016-06-19: 500 mg via INTRAVENOUS
  Filled 2016-06-19 (×3): qty 5

## 2016-06-19 MED ORDER — CEFAZOLIN IN D5W 1 GM/50ML IV SOLN
1.0000 g | Freq: Three times a day (TID) | INTRAVENOUS | Status: DC
  Administered 2016-06-19: 1 g via INTRAVENOUS
  Filled 2016-06-19 (×2): qty 50

## 2016-06-19 MED ORDER — MORPHINE SULFATE (PF) 2 MG/ML IV SOLN: 1.0000 mg | INTRAVENOUS | Status: DC | PRN

## 2016-06-19 MED ORDER — ACETAMINOPHEN 650 MG RE SUPP
650.0000 mg | RECTAL | Status: DC | PRN
Start: 2016-06-19 — End: 2016-06-21

## 2016-06-19 MED ORDER — FAMOTIDINE IN NACL 20-0.9 MG/50ML-% IV SOLN
20.0000 mg | Freq: Two times a day (BID) | INTRAVENOUS | Status: DC
  Administered 2016-06-19 (×3): 20 mg via INTRAVENOUS
  Filled 2016-06-19 (×3): qty 50

## 2016-06-19 MED ORDER — LEVETIRACETAM 500 MG PO TABS
500.0000 mg | ORAL_TABLET | Freq: Two times a day (BID) | ORAL | Status: DC
  Administered 2016-06-19 – 2016-06-21 (×4): 500 mg via ORAL
  Filled 2016-06-19 (×4): qty 1

## 2016-06-19 MED ORDER — ACETAMINOPHEN 325 MG PO TABS
650.0000 mg | ORAL_TABLET | ORAL | Status: DC | PRN
  Administered 2016-06-19: 650 mg via ORAL
  Filled 2016-06-19: qty 2

## 2016-06-19 MED ORDER — LABETALOL HCL 5 MG/ML IV SOLN: 10.0000 mg | INTRAVENOUS | Status: DC | PRN

## 2016-06-19 MED ORDER — HYDROCODONE-ACETAMINOPHEN 5-325 MG PO TABS
1.0000 | ORAL_TABLET | ORAL | Status: DC | PRN
  Administered 2016-06-20: 1 via ORAL
  Filled 2016-06-19: qty 1

## 2016-06-19 MED ORDER — ONDANSETRON HCL 4 MG/2ML IJ SOLN: 4.0000 mg | INTRAMUSCULAR | Status: DC | PRN

## 2016-06-19 MED ORDER — ONDANSETRON HCL 4 MG PO TABS: 4.0000 mg | ORAL_TABLET | ORAL | Status: DC | PRN

## 2016-06-19 MED ORDER — DEXAMETHASONE SODIUM PHOSPHATE 4 MG/ML IJ SOLN
4.0000 mg | Freq: Three times a day (TID) | INTRAMUSCULAR | Status: DC
  Administered 2016-06-21: 4 mg via INTRAVENOUS
  Filled 2016-06-19: qty 1

## 2016-06-19 MED ORDER — PROMETHAZINE HCL 25 MG PO TABS: 12.5000 mg | ORAL_TABLET | ORAL | Status: DC | PRN

## 2016-06-19 NOTE — Progress Notes (Signed)
Physician notified of hemovac being accidentally removed by patient. Dry drsg applied.

## 2016-06-19 NOTE — Progress Notes (Signed)
Dr. Kathyrn Sheriff notified of JP drain no longer able to hold a charge. No new orders at this time. Will continue to monitor closely.

## 2016-06-19 NOTE — Progress Notes (Signed)
Patient ID: Christina [name], female   DOB: 1961/03/19, 55 y.o.   MRN: [id number] Subjective: Patient reports much better. No headache. Some clumsiness of left arm. Eating well.  Objective: Vital signs in last 24 hours: Temp:  [97.3 F (36.3 C)-98.7 F (37.1 C)] 98 F (36.7 C) (11/12 0758) Pulse Rate:  [54-105] 89 (11/12 0800) Resp:  [4-26] 20 (11/12 0800) BP: (118-156)/(61-92) 131/72 (11/12 0800) SpO2:  [82 %-100 %] 94 % (11/12 0800)  Intake/Output from previous day: 11/11 0701 - 11/12 0700 In: 3434 [P.O.:1850; I.V.:1300; IV Piggyback:254] Out: 2422 [Urine:1950; Drains:397; Blood:75] Intake/Output this shift: Total I/O In: -  Out: 35 [Drains:35]  Neurologic: Grossly normal with very slight left pronator drift., Awake and alert. Dressing dry. Conversant. No aphasia.  Lab Results: Lab Results  Component Value Date   WBC 6.7 06/17/2016   HGB 11.1 (L) 06/17/2016   HCT 33.1 (L) 06/17/2016   MCV 92.2 06/17/2016   PLT 236 06/17/2016   Lab Results  Component Value Date   INR 0.90 06/17/2016   BMET Lab Results  Component Value Date   NA 137 06/17/2016   K 4.0 06/17/2016   CL 106 06/17/2016   CO2 23 06/17/2016   GLUCOSE 104 (H) 06/17/2016   BUN 15 06/17/2016   CREATININE 0.80 06/17/2016   CALCIUM 9.1 06/17/2016    Studies/Results: Dg Chest 2 View  Result Date: 06/17/2016 CLINICAL DATA:  Preoperative chest x-ray . EXAM: CHEST  2 VIEW COMPARISON:  06/29/2015. FINDINGS: Mediastinum and hilar structures are normal. Mild bibasilar atelectasis, right side greater than left. Cardiomegaly with normal pulmonary vascularity. No pleural effusion or pneumothorax. Stable elevation right hemidiaphragm. IMPRESSION: 1. Mild bibasilar atelectasis, right side greater than left. Stable elevation right hemidiaphragm. 2. Cardiomegaly.  No pulmonary venous congestion. Electronically Signed   By: Marcello Moores  Register   On: 06/17/2016 13:34   Ct Head Wo Contrast  Result Date: 06/18/2016 CLINICAL  DATA:  Subdural hematoma EXAM: CT HEAD WITHOUT CONTRAST TECHNIQUE: Contiguous axial images were obtained from the base of the skull through the vertex without intravenous contrast. COMPARISON:  CT head 06/18/2016 FINDINGS: Brain: Right frontal craniotomy. Subdural drain has been removed. Subcutaneous drain remains in place overlying the skull flap. Mild pneumocephalus anteriorly. Residual high density subdural hematoma is unchanged. This is most prominent over the convexity in the parietal region were it measures approximately 21 mm in thickness on coronal imaging, unchanged. There is significant mass-effect on the underlying brain. Small subdural hematoma extends along the orbital roof on the right. No new hemorrhage. 8 mm midline shift is unchanged. Negative for hydrocephalus. No acute ischemic infarct. Vascular: Negative for dense MCA Skull: Right frontal craniotomy. Sinuses/Orbits: Wires in the maxillary sinus from prior surgery bilaterally. Other: None IMPRESSION: Large high density subdural hematoma over the right convexity is unchanged. Subdural drain has been removed in the interval. 8 mm midline shift to the left is unchanged from earlier today. Electronically Signed   By: Franchot Gallo M.D.   On: 06/18/2016 20:59   Ct Head Wo Contrast  Result Date: 06/18/2016 CLINICAL DATA:  Followup subdural hematoma. EXAM: CT HEAD WITHOUT CONTRAST TECHNIQUE: Contiguous axial images were obtained from the base of the skull through the vertex without intravenous contrast. COMPARISON:  06/17/2016 FINDINGS: Brain: Interval right frontal craniotomy for subdural evacuation. Subdural drain is in place running from front to back. Previously seen next density but predominantly low-density subdural is thin error in the frontal region, but at the right parietal vertex shows  the presence of acute hemorrhage, now hyper dense. In the posterior aspect only, there is slightly more mass effect with indentation of the brain. Maximal  thickness is 22 mm in that region compared to 20 mm previously. Overall, the degree of mass effect is less with right-to-left shift of 7.4 mm as opposed to 13 mm yesterday. Small amount of subdural blood is no evident along both sides of the falx. No intraparenchymal hemorrhage. No sign of ischemic infarction. Vascular: No acute vascular finding. Skull: Postoperative changes.  No unexpected finding. Sinuses/Orbits: Negative Other: None significant IMPRESSION: Interval right frontal craniotomy for right subdural evacuation. Subdural drain in place. There has been acute hemorrhage into the subdural collection which is previously primarily low-density. In general, there is less mass effect with right-to-left shift reduced from 13 mm to 17 mm. The collection is slightly bulky air at the posterior superior aspect, measuring 22 mm as opposed to 20 mm in that region. I am in the process of calling this report. Electronically Signed   By: Nelson Chimes M.D.   On: 06/18/2016 07:09    Assessment/Plan: Looks great today. PT OT. CT of head tomorrow. Continue drains for now.   LOS: 2 days    Shacara Cozine S 06/19/2016, 10:10 AM

## 2016-06-19 NOTE — Anesthesia Postprocedure Evaluation (Signed)
Anesthesia Post Note  Patient: Christina [name]  Procedure(s) Performed: Procedure(s) (LRB): CRANIOTOMY HEMATOMA EVACUATION SUBDURAL RIGHT (Right)  Patient location during evaluation: PACU Anesthesia Type: General Level of consciousness: lethargic (at pre-op level neuro status) Pain management: pain level controlled Vital Signs Assessment: post-procedure vital signs reviewed and stable Respiratory status: spontaneous breathing, nonlabored ventilation, respiratory function stable and patient connected to nasal cannula oxygen Cardiovascular status: blood pressure returned to baseline and stable Postop Assessment: no signs of nausea or vomiting Anesthetic complications: no    Last Vitals:  Vitals:   06/18/16 2307 06/18/16 2315  BP: (!) 143/78 (P) 139/72  Pulse: (!) 105 (!) 102  Resp: 15 (!) 24  Temp: 36.3 C     Last Pain:  Vitals:   06/18/16 2000  TempSrc: Axillary  PainSc:                  Catalina Gravel

## 2016-06-20 ENCOUNTER — Encounter (HOSPITAL_COMMUNITY): Payer: Self-pay | Admitting: Neurological Surgery

## 2016-06-20 ENCOUNTER — Inpatient Hospital Stay (HOSPITAL_COMMUNITY): Payer: Managed Care, Other (non HMO)

## 2016-06-20 IMAGING — CT CT HEAD W/O CM
3 of 4 series · 15 of 47 positions shown, 18 images · non-contrast
Comparison: [DATE] and [DATE] CT.

CLINICAL DATA: 54-year-old female with intracranial hemorrhage post
surgery. Subsequent encounter.

EXAM:
CT HEAD WITHOUT CONTRAST
TECHNIQUE: Contiguous axial images were obtained from the base of the skull
through the vertex without intravenous contrast.

[Series 2: head 5.0 h30s · axial · 0.47mm/px · z∈[-146,+4]mm · 9 of 36 slices shown, 12 images]
[im 3/36  brain]
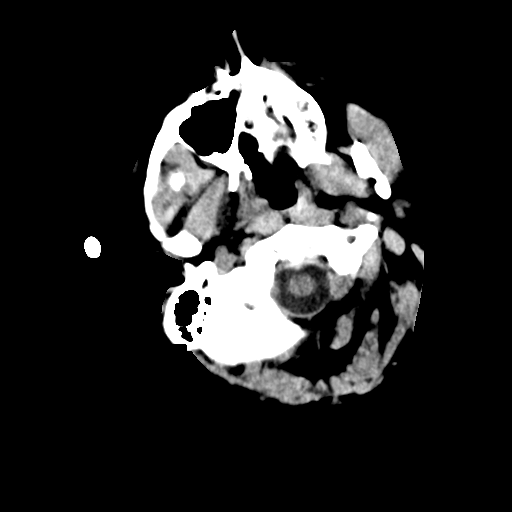
[im 3/36  bone]
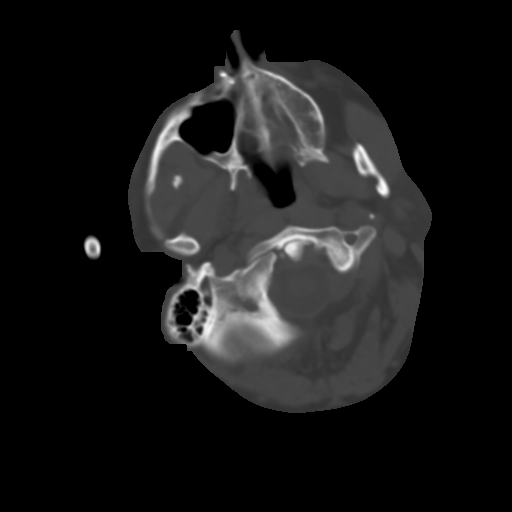
[im 8/36  brain]
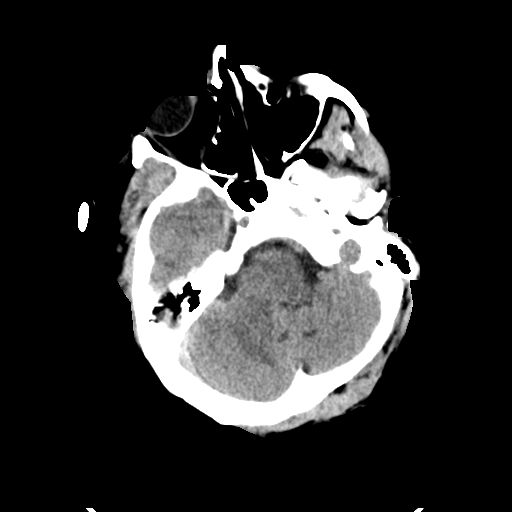
[im 11/36  brain]
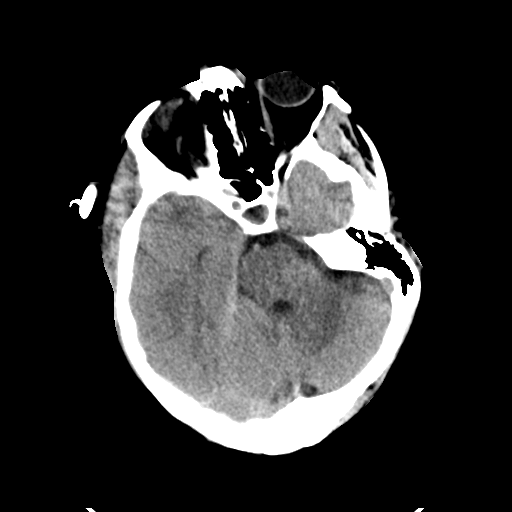
[im 16/36  brain]
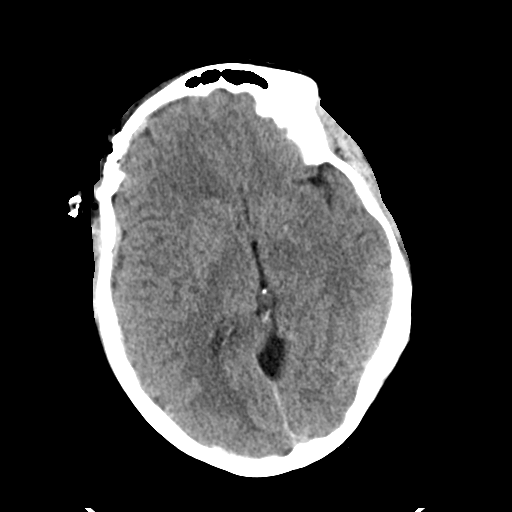
[im 18/36  brain]
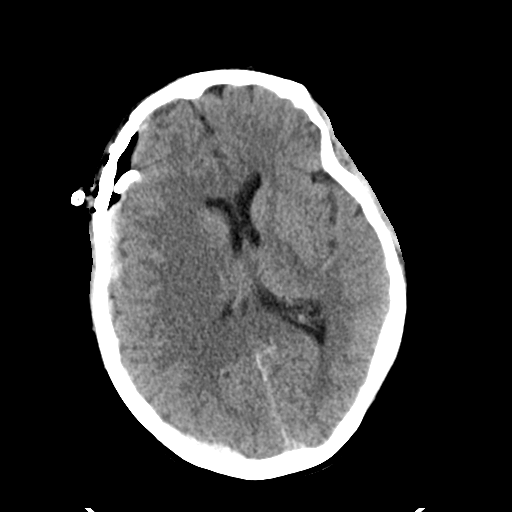
[im 18/36  bone]
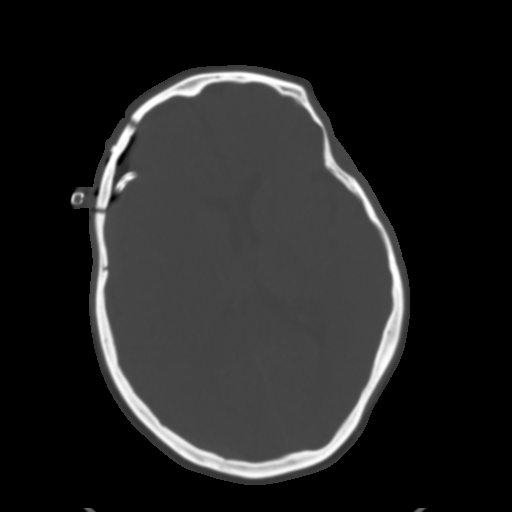
[im 21/36  brain]
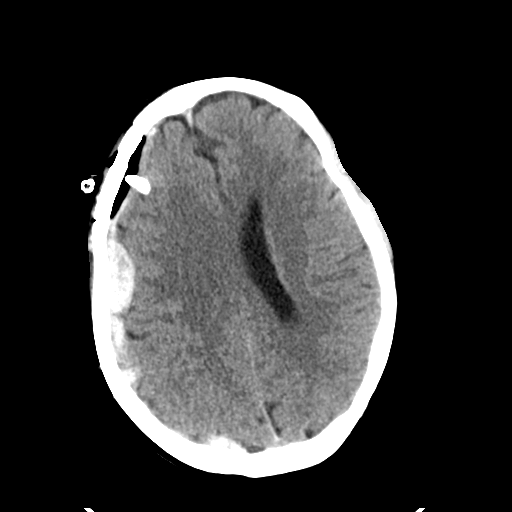
[im 26/36  brain]
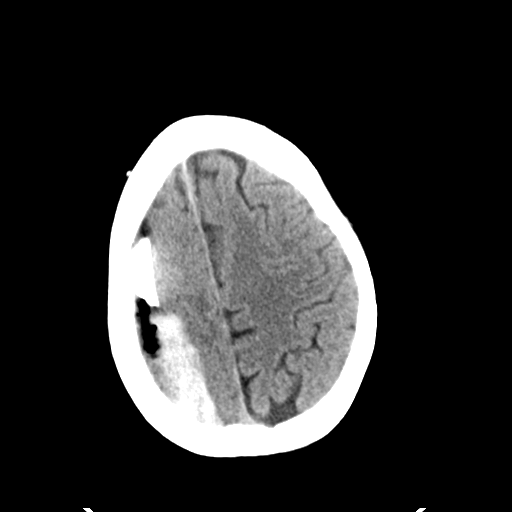
[im 28/36  brain]
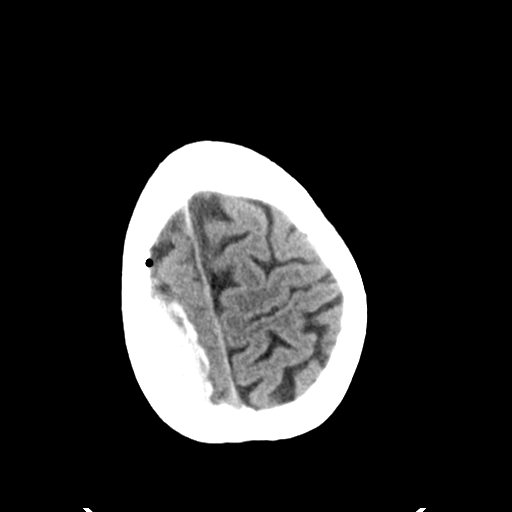
[im 33/36  brain]
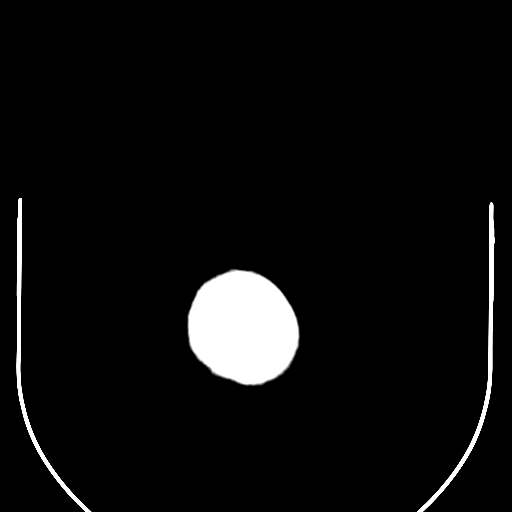
[im 33/36  bone]
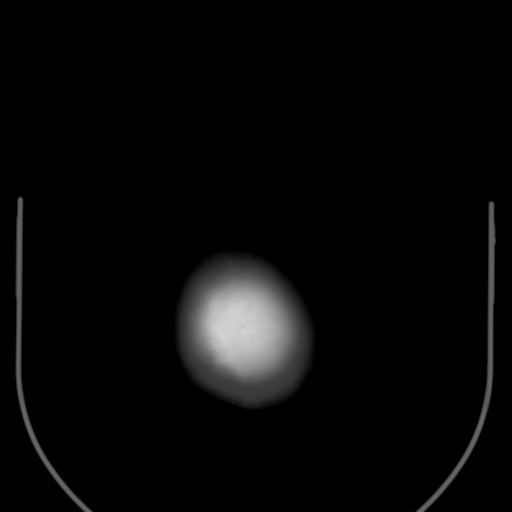

[Series 4: head 3.0 mpr · coronal · 0.36mm/px · 3 of 74 slices shown (1 of 2)]
[im 25/74  brain]
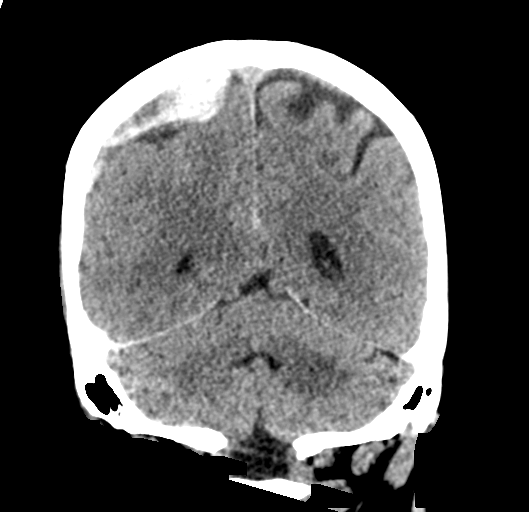
[im 33/74  brain]
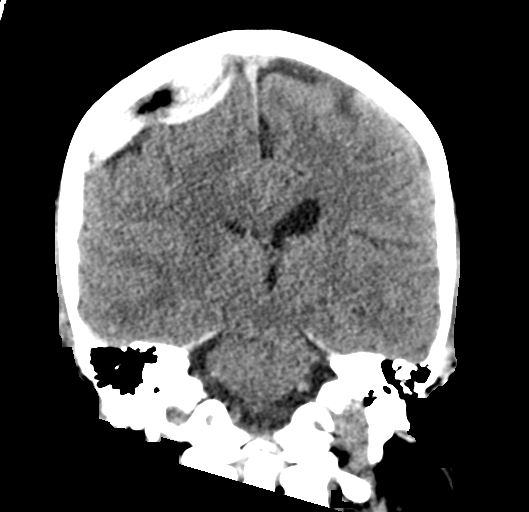
[im 41/74  brain]
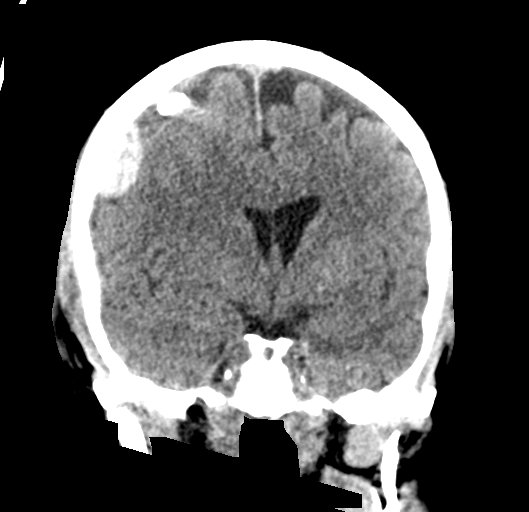

[Series 5: head 3.0 mpr · sagittal · 0.35mm/px · 3 of 54 slices shown (2 of 2)]
[im 24/54  brain]
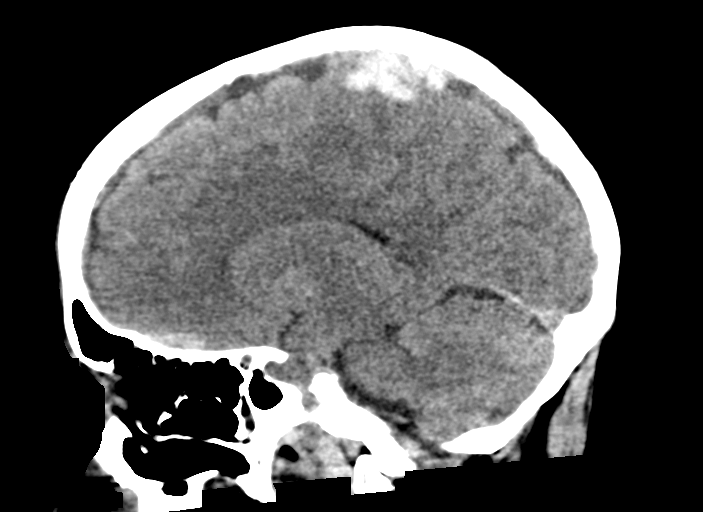
[im 27/54  brain]
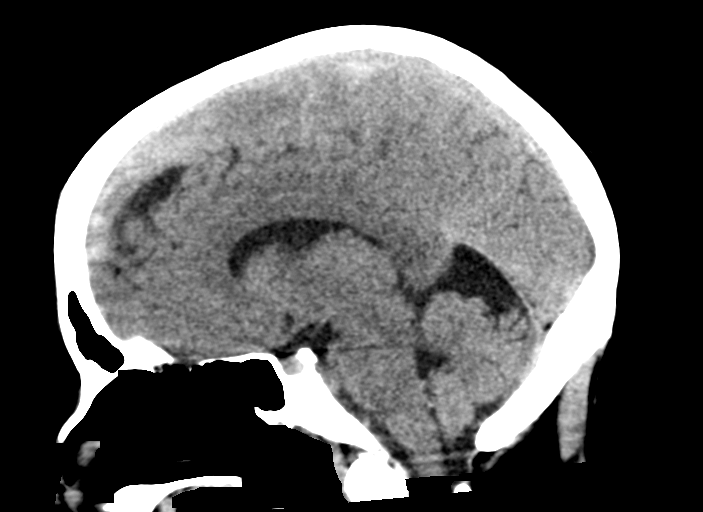
[im 31/54  brain]
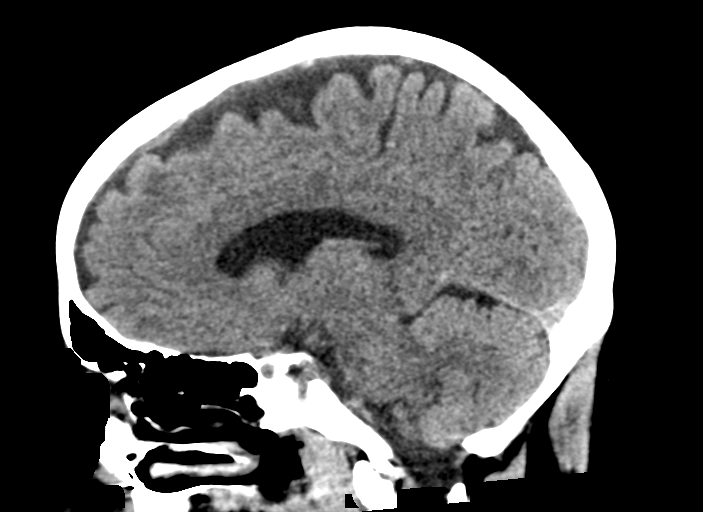

[15 of 47 positions shown; findings below may reference images not displayed]

FINDINGS: Brain: Post right frontal craniotomy for drainage of right-sided
subdural collection with repositioning of drainage catheter.
Decrease in size of right-sided subdural hematoma. Residual
moderately large right convexity subdural hematoma with maximal
thickness of 1.8 cm versus prior maximal thickness of 2.1 cm. Mass
effect upon the right lateral ventricle which is displaced
inferiorly and to the right. Midline shift by 5.3 mm versus prior
8.3 mm.

The largest component of the residual subdural collection is located
posterior to the repositioned right frontal drainage catheter.

Small amount of pneumocephalus.

No evidence of thrombotic infarct.

No intracranial mass lesion separate from above described findings.

Vascular: No acute abnormality.

Skull: Postsurgical changes.

Sinuses/Orbits: No acute orbital abnormality. Prior maxillary sinus
surgery.

Other: Negative
IMPRESSION: Post right frontal craniotomy for drainage of right-sided subdural
collection with repositioning of drainage catheter. Decrease in size
of right-sided subdural hematoma. Residual moderately large right
convexity subdural hematoma with maximal thickness of 1.8 cm versus
prior maximal thickness of 2.1 cm. Mass effect upon the right
lateral ventricle which is displaced inferiorly and to the right.
Midline shift by 5.3 mm versus prior 8.3 mm.

The largest component of the residual subdural collection is located
posterior to the repositioned right frontal drainage catheter.

## 2016-06-20 MED ORDER — FAMOTIDINE 20 MG PO TABS
20.0000 mg | ORAL_TABLET | Freq: Two times a day (BID) | ORAL | Status: DC
  Administered 2016-06-20 – 2016-06-21 (×3): 20 mg via ORAL
  Filled 2016-06-20 (×3): qty 1

## 2016-06-20 NOTE — Evaluation (Signed)
Occupational Therapy Evaluation Patient Details Name: Christina [name] MRN: [id number] DOB: 02-04-1961 Today's Date: 06/20/2016    History of Present Illness Patient is a 55 yo female admitted 06/17/16 with headache x 5 weeks.  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  Patient s/p craniotomy and evacuation of SDH on 06/17/16.  s/p Repeat right craniotomy for evacuation of acute subdural hematoma.  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 She will benefit from acute OT with follow up OPOT.I have advised pt not to drive due to left inattention.    Follow Up Recommendations  Outpatient OT;Supervision/Assistance - 24 hour    Equipment Recommendations  Tub/shower seat       Precautions / Restrictions Precautions Precautions: Fall Precaution Comments: JP drain; hemovac accidentally pulled out 11/13. Restrictions Weight Bearing Restrictions: No      Mobility Bed Mobility               General bed mobility comments: Up in chair upon OT arrival.   Transfers Overall transfer level: Needs assistance Equipment used: None Transfers: Sit to/from Stand Sit to Stand: Min guard         General transfer comment: Min guard for safety. Stood from Automotive engineer. No dizziness.     Balance Overall balance assessment: Needs assistance Sitting-balance support: Feet supported;No upper extremity supported Sitting balance-Leahy Scale: Good     Standing balance support: During functional activity Standing balance-Leahy Scale: Fair Standing balance comment: Able to stand at sink and find paper towels on left side as well as wash hands with some mild instability but no LOB. I posterior lean when manipulating cap off of container while standing                            ADL                                         General ADL Comments: Min A at times due to decreased use of LUE; otherwise S. I did educate pt/husband on pt not driving due to left inattention as well as not doing stove top cooking due to left inattention and perhaps puttng her hand on burner and not cuttign with knifes due to left hand weakness and left inattention     Vision Vision Assessment?: Yes Eye Alignment: Within Functional Limits Ocular Range of Motion: Within Functional Limits Alignment/Gaze Preference: Within Defined Limits Tracking/Visual Pursuits: Able to track stimulus in all quads without difficulty Saccades: Within functional limits (once she got the hang of what I was asking her to do (I did demonstrate for her prior to testing)) Convergence: Within functional limits Visual Fields: No apparent deficits       Pertinent Vitals/Pain Pain Assessment: Faces Faces Pain Scale: Hurts little more Pain Location: headache Pain Descriptors / Indicators: Headache Pain Intervention(s): Monitored during session;Repositioned     Hand Dominance Right   Extremity/Trunk Assessment             Communication Communication Communication: HOH (wears hearing aids)   Cognition Arousal/Alertness: Awake/alert Behavior During Therapy: Impulsive Overall Cognitive Status: Impaired/Different  from baseline Area of Impairment: Following commands;Safety/judgement       Following Commands: Follows multi-step commands with increased time (and repeition (related to [facility] I believe)) Safety/Judgement: Decreased awareness of safety   Problem Solving: Requires verbal cues General Comments: Requires repetition of cues at times- deficit vs hearing loss. Left inattention noted during mobility.       Exercises   Other Exercises Other Exercises: Educated pt on activies to do with Bil UEs (opening and closing containers in room), folding  washcloh/towel in lap and holdign up in [place] expects to be discharged to:: Private residence Living Arrangements: Spouse/significant other;Children Available Help at Discharge: Family;Available 24 hours/day Type of Home: House Home Access: Stairs to enter [employer] of Steps: 2-3 Entrance Stairs-Rails: None Home Layout: Two level;Bed/bath upstairs Alternate Level Stairs-Number of Steps: flight Alternate Level Stairs-Rails: Right Bathroom Shower/Tub: Walk-in Hydrologist: Standard     Home Equipment: None          Prior Functioning/Environment Level of Independence: Independent        Comments: Works full time Aeronautical engineer        OT Problem List: Decreased strength;Impaired UE functional use;Impaired balance (sitting and/or standing);Other (comment) (left inattention)   OT Treatment/Interventions: Self-care/ADL training;Therapeutic activities;Therapeutic exercise;Patient/family education;Balance training    OT Goals(Current goals can be found in the care plan section) Acute Rehab OT Goals Patient Stated Goal: To return home and back to work OT Goal Formulation: With patient Time For Goal Achievement: 06/27/16 Potential to Achieve Goals: Good  OT Frequency: Min 3X/week           Co-evaluation PT/OT/SLP Co-Evaluation/Treatment: Yes Reason for Co-Treatment: For patient/therapist safety   OT goals addressed during session: ADL's and self-care;Strengthening/ROM      End of Session Equipment Utilized During Treatment: Gait belt Nurse Communication: Mobility status (left inattention)  Activity Tolerance: Patient tolerated treatment well Patient left: in chair;with call bell/phone within reach;with family/visitor present   Time: [id number] OT Time Calculation (min): 29 min Charges:  OT General Charges $OT Visit: 1 Procedure OT Evaluation $OT Eval Moderate Complexity: 1 Procedure  Almon Register [id number] 06/20/2016, 12:26 PM

## 2016-06-20 NOTE — Progress Notes (Signed)
Patient ID: Christina [name], female   DOB: 15-Sep-1960, 55 y.o.   MRN: [id number] Subjective: Patient reports no headache, no NT, some clumsiness on L  Objective: Vital signs in last 24 hours: Temp:  [97.5 F (36.4 C)-98.2 F (36.8 C)] 97.5 F (36.4 C) (11/13 1200) Pulse Rate:  [65-98] 84 (11/13 1100) Resp:  [14-25] 25 (11/13 1200) BP: (127-164)/(66-99) 148/69 (11/13 1200) SpO2:  [90 %-100 %] 100 % (11/13 1100)  Intake/Output from previous day: 11/12 0701 - 11/13 0700 In: 340 [P.O.:240; IV Piggyback:100] Out: 880 [Urine:700; Drains:180] Intake/Output this shift: No intake/output data recorded.  Neurologic: Grossly normal, with mild L sided drift only  Lab Results: Lab Results  Component Value Date   WBC 6.7 06/17/2016   HGB 11.1 (L) 06/17/2016   HCT 33.1 (L) 06/17/2016   MCV 92.2 06/17/2016   PLT 236 06/17/2016   Lab Results  Component Value Date   INR 0.90 06/17/2016   BMET Lab Results  Component Value Date   NA 137 06/17/2016   K 4.0 06/17/2016   CL 106 06/17/2016   CO2 23 06/17/2016   GLUCOSE 104 (H) 06/17/2016   BUN 15 06/17/2016   CREATININE 0.80 06/17/2016   CALCIUM 9.1 06/17/2016    Studies/Results: Ct Head Wo Contrast  Result Date: 06/20/2016 CLINICAL DATA:  55 year old female with intracranial hemorrhage post surgery. Subsequent encounter. EXAM: CT HEAD WITHOUT CONTRAST TECHNIQUE: Contiguous axial images were obtained from the base of the skull through the vertex without intravenous contrast. COMPARISON:  06/18/2016 and 06/17/2016 CT. FINDINGS: Brain: Post right frontal craniotomy for drainage of right-sided subdural collection with repositioning of drainage catheter. Decrease in size of right-sided subdural hematoma. Residual moderately large right convexity subdural hematoma with maximal thickness of 1.8 cm versus prior maximal thickness of 2.1 cm. Mass effect upon the right lateral ventricle which is displaced inferiorly and to the right. Midline shift by 5.3  mm versus prior 8.3 mm. The largest component of the residual subdural collection is located posterior to the repositioned right frontal drainage catheter. Small amount of pneumocephalus. No evidence of thrombotic infarct. No intracranial mass lesion separate from above described findings. Vascular: No acute abnormality. Skull: Postsurgical changes. Sinuses/Orbits: No acute orbital abnormality. Prior maxillary sinus surgery. Other: Negative IMPRESSION: Post right frontal craniotomy for drainage of right-sided subdural collection with repositioning of drainage catheter. Decrease in size of right-sided subdural hematoma. Residual moderately large right convexity subdural hematoma with maximal thickness of 1.8 cm versus prior maximal thickness of 2.1 cm. Mass effect upon the right lateral ventricle which is displaced inferiorly and to the right. Midline shift by 5.3 mm versus prior 8.3 mm. The largest component of the residual subdural collection is located posterior to the repositioned right frontal drainage catheter. Electronically Signed   By: Genia Del M.D.   On: 06/20/2016 07:02   Ct Head Wo Contrast  Result Date: 06/18/2016 CLINICAL DATA:  Subdural hematoma EXAM: CT HEAD WITHOUT CONTRAST TECHNIQUE: Contiguous axial images were obtained from the base of the skull through the vertex without intravenous contrast. COMPARISON:  CT head 06/18/2016 FINDINGS: Brain: Right frontal craniotomy. Subdural drain has been removed. Subcutaneous drain remains in place overlying the skull flap. Mild pneumocephalus anteriorly. Residual high density subdural hematoma is unchanged. This is most prominent over the convexity in the parietal region were it measures approximately 21 mm in thickness on coronal imaging, unchanged. There is significant mass-effect on the underlying brain. Small subdural hematoma extends along the orbital roof on the  right. No new hemorrhage. 8 mm midline shift is unchanged. Negative for  hydrocephalus. No acute ischemic infarct. Vascular: Negative for dense MCA Skull: Right frontal craniotomy. Sinuses/Orbits: Wires in the maxillary sinus from prior surgery bilaterally. Other: None IMPRESSION: Large high density subdural hematoma over the right convexity is unchanged. Subdural drain has been removed in the interval. 8 mm midline shift to the left is unchanged from earlier today. Electronically Signed   By: Franchot Gallo M.D.   On: 06/18/2016 20:59    Assessment/Plan: Doing well, to floor today   LOS: 3 days    Torrez Renfroe S 06/20/2016, 12:37 PM

## 2016-06-20 NOTE — Progress Notes (Signed)
Physical Therapy Treatment Patient Details Name: Christina [name] MRN: [id number] DOB: 02-06-1961 Today's Date: 06/20/2016    History of Present Illness Patient is a 55 yo female admitted 06/17/16 with headache x 5 weeks.  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  Patient s/p craniotomy and evacuation of SDH on 06/17/16.  s/p Repeat right craniotomy for evacuation of acute subdural hematoma.  PMH: Graves' Disease, HTN    PT Comments    Patient s/p repeat craniotomy for evacuation of right SDH. Tolerated gait training with Min guard assist-Min A for safety due to veering left and almost bumping into obstacles in hallway. Tolerated head turns with only mild instability but no LOB. Pt with left inattention most notable during ambulation and manipulating environment. Will continue to follow to perform higher level balance activities, stair training and focusing on left inattention for safe discharge home. Will follow.  Follow Up Recommendations  Supervision for mobility/OOB;Outpatient PT     Equipment Recommendations  None recommended by PT    Recommendations for Other Services       Precautions / Restrictions Precautions Precautions: Fall Precaution Comments: JP drain; hemovac accidentally pulled out 11/13. Restrictions Weight Bearing Restrictions: No    Mobility  Bed Mobility               General bed mobility comments: Up in chair upon PT arrival.   Transfers Overall transfer level: Needs assistance Equipment used: None Transfers: Sit to/from Stand Sit to Stand: Min guard         General transfer comment: Min guard for safety. Stood from Automotive engineer. No dizziness.   Ambulation/Gait Ambulation/Gait assistance: Min guard Ambulation Distance (Feet): 175 Feet Assistive device: None Gait Pattern/deviations: Step-through pattern;Staggering left;Decreased stride length   Gait velocity interpretation: Below normal speed for age/gender General Gait Details:  Pt veering to the left during ambulation, coming close to running into obstacles in environment on left; able to perform head turns without LOB but instability noted. VSS   Stairs            Wheelchair Mobility    Modified Rankin (Stroke Patients Only)       Balance Overall balance assessment: Needs assistance Sitting-balance support: Feet supported;No upper extremity supported Sitting balance-Leahy Scale: Good     Standing balance support: During functional activity Standing balance-Leahy Scale: Fair Standing balance comment: Able to stand at sink and find towels on left side as well as wash hands with some mild instability but no LOB. 1 posterior lean when manipulating lotion cap.                    Cognition Arousal/Alertness: Awake/alert Behavior During Therapy: Impulsive Overall Cognitive Status: Impaired/Different from baseline Area of Impairment: Following commands;Safety/judgement       Following Commands: Follows multi-step commands with increased time (and repetition.) Safety/Judgement: Decreased awareness of safety   Problem Solving: Requires verbal cues General Comments: Requires repetition of cues at times- deficit vs hearing loss. Left inattention noted during mobility.     Exercises      General Comments General comments (skin integrity, edema, etc.): Spouse present during session.      Pertinent Vitals/Pain Pain Assessment: Faces Faces Pain Scale: Hurts little more Pain Location: headache Pain Descriptors / Indicators: Headache Pain Intervention(s): Monitored during session;Repositioned    Home Living                      Prior Function  PT Goals (current goals can now be found in the care plan section) Progress towards PT goals: Progressing toward goals    Frequency    Min 4X/week      PT Plan Current plan remains appropriate    Co-evaluation             End of Session Equipment Utilized  During Treatment: Gait belt Activity Tolerance: Patient tolerated treatment well Patient left: in chair;with call bell/phone within reach;with family/visitor present     Time: 1010-1038 PT Time Calculation (min) (ACUTE ONLY): 28 min  Charges:  $Gait Training: 8-22 mins                    G Codes:      Madhav Mohon A Timonthy Hovater 06/20/2016, 10:49 AM Wray Kearns, PT, DPT [phone]

## 2016-06-20 NOTE — Anesthesia Postprocedure Evaluation (Signed)
Anesthesia Post Note  Patient: Christina [name]  Procedure(s) Performed: Procedure(s) (LRB): RIGHT CRANIOTOMY HEMATOMA EVACUATION SUBDURAL (N/A)  Patient location during evaluation: PACU Anesthesia Type: General Level of consciousness: awake and alert Pain management: pain level controlled Vital Signs Assessment: post-procedure vital signs reviewed and stable Respiratory status: spontaneous breathing, nonlabored ventilation, respiratory function stable and patient connected to nasal cannula oxygen Cardiovascular status: blood pressure returned to baseline and stable Postop Assessment: no signs of nausea or vomiting Anesthetic complications: no    Last Vitals:  Vitals:   06/20/16 0500 06/20/16 0600  BP: (!) 147/89 (!) 142/77  Pulse: 83 66  Resp: (!) 24 18  Temp:      Last Pain:  Vitals:   06/20/16 0400  TempSrc: Oral  PainSc:                  [name]

## 2016-06-21 ENCOUNTER — Other Ambulatory Visit: Payer: Self-pay | Admitting: *Deleted

## 2016-06-21 DIAGNOSIS — S065XAA Traumatic subdural hemorrhage with loss of consciousness status unknown, initial encounter: Secondary | ICD-10-CM

## 2016-06-21 DIAGNOSIS — S065X9A Traumatic subdural hemorrhage with loss of consciousness of unspecified duration, initial encounter: Secondary | ICD-10-CM

## 2016-06-21 MED ORDER — LEVETIRACETAM 500 MG PO TABS: 500.0000 mg | ORAL_TABLET | Freq: Two times a day (BID) | ORAL | 1 refills | Status: DC

## 2016-06-21 NOTE — Progress Notes (Signed)
Occupational Therapy Treatment Patient Details Name: Christina [name] MRN: [id number] DOB: 1961/03/05 Today's Date: 06/21/2016    History of present illness Patient is a 55 yo female admitted 06/17/16 with headache x 5 weeks.  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  Patient s/p craniotomy and evacuation of SDH on 06/17/16.  s/p Repeat right craniotomy for evacuation of acute subdural hematoma.  PMH: Graves' Disease, HTN   OT comments  This 55 yo female admitted with above presents to acute OT with making progress towards increased use of her LUE. She will continue to benefit from follow up OT at OP. Of note she reports seeing things at times on her left that are not there, but get her attention and she looks that way to only realize nothing his there (in the case she described seeing a hand come around from there left side--that was not there). I did mention this to Dr. Ronnald Ramp during my earlier session and her husband is aware as well.  Follow Up Recommendations  Outpatient OT;Supervision/Assistance - 24 hour    Equipment Recommendations  Tub/shower seat       Precautions / Restrictions Precautions Precautions: Fall Restrictions Weight Bearing Restrictions: No       Mobility Bed Mobility               General bed mobility comments: Up in chair.  Transfers Overall transfer level: Needs assistance Equipment used: None Transfers: Sit to/from Stand Sit to Stand: Min guard         General transfer comment: Min guard for safety. Stood from chair. No dizziness.     Balance Overall balance assessment: Needs assistance Sitting-balance support: Feet supported;No upper extremity supported Sitting balance-Leahy Scale: Good     Standing balance support: During functional activity Standing balance-Leahy Scale: Fair               High level balance activites: Head turns;Sudden stops;Direction changes High Level Balance Comments: Able to perform above with  only mild deviations in gait but no overt LOB.         Vision                 Additional Comments: Pt able to do pen and paper tasks without issue from visual standpoint, but had difficulty with one sheet from a cognitive standpoint, see cogntive section   Perception     Praxis      Cognition   Behavior During Therapy: [facility] for tasks assessed/performed Overall Cognitive Status: Impaired/Different from baseline Area of Impairment: Following commands;Problem solving        Following Commands: Follows one step commands consistently Safety/Judgement: Decreased awareness of safety   Problem Solving: Requires verbal cues General Comments: Did have decreased comprehension without multiple cues of task to cross out numbers in order on a page (ie: cross out numbers 30-45 in order---then there are various rows of numbers all in a row). Once she understood what to do, she did find except she went past the number she was suppose to and then started going backwards on the lines to find the higher numbers      Exercises Other Exercises Other Exercises: Back to see pt for FM/GM activties with husband present to see what activties I have her doing. She was able to tell him that she is to do the theraputty exercises at least 3 times a day. She was also able to tell him some of the other activies that she is  suppose to do from the other sheet as she was doing the theraputty activities. She then showed him the other activities taht she is suppose to do on the sheet and talked about items that they need to get. Of note her finger to thumb opposition for middle finger on left hand was much improved with eyes shut.           Pertinent Vitals/ Pain       Pain Assessment: No/denies pain         Frequency  Min 3X/week        Progress Toward Goals  OT Goals(current goals can now be found in the care plan section)  Progress towards OT goals: Progressing toward goals (all acute education  completed with OPOT recommended)     Plan Discharge plan remains appropriate       End of Session  in recliner with husband in room   Activity Tolerance Patient tolerated treatment well   Patient Left in chair           Time: [id number] OT Time Calculation (min): 31 min  Charges: OT General Charges $OT Visit: 1 Procedure OT Treatments $Therapeutic Activity: 23-37 mins  Almon Register [id number] 06/21/2016, 1:54 PM

## 2016-06-21 NOTE — Progress Notes (Signed)
Occupational Therapy Treatment Patient Details Name: DIAJA ON MRN: [id number] DOB: 11-25-1960 Today's Date: 06/21/2016    History of present illness Patient is a 55 yo female admitted 06/17/16 with headache x 5 weeks.  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  Patient s/p craniotomy and evacuation of SDH on 06/17/16.  s/p Repeat right craniotomy for evacuation of acute subdural hematoma.  PMH: Graves' Disease, HTN   OT comments  This 55 yo female seen today for treatment to address LUE deficits. She was able to do the activities taught to her some with more effort than others. She will continue to benefit from acute OT with OP follow up.  Follow Up Recommendations  Outpatient OT;Supervision/Assistance - 24 hour    Equipment Recommendations  Tub/shower seat       Precautions / Restrictions Precautions Precautions: Fall Restrictions Weight Bearing Restrictions: No                              Cognition   Behavior During Therapy: WFL for tasks assessed/performed   Area of Impairment: Following commands;Problem solving        Following Commands: Follows one step commands consistently     Needs verbal cues for some problem solving       Exercises Other Exercises Other Exercises: Educated pt on and gave handout on Bil UE activities and LUE activities. Hand out on FM/GM activities and handout on theraputty sequence of actitivies and went over these with her and had her practice. One of the activities she had trouble with thumb to finger tips (middle finger) with eyes shut. Issued pt red theraputty.           Pertinent Vitals/ Pain       Pain Assessment: No/denies pain         Frequency  Min 3X/week        Progress Toward Goals  OT Goals(current goals can now be found in the care plan section)  Progress towards OT goals: Progressing toward goals     Plan Discharge plan remains appropriate       End of Session  Sitting in chair  with husband present   Activity Tolerance Patient tolerated treatment well   Patient Left in bed   Nurse Communication  I need to come back one more time to see pt with husband in room        Time: [phone] OT Time Calculation (min): 32 min  Charges: OT General Charges $OT Visit: 1 Procedure OT Treatments $Therapeutic Activity: 23-37 mins  Almon Register [id number] 06/21/2016, 10:27 AM

## 2016-06-21 NOTE — Discharge Summary (Signed)
Physician Discharge Summary  Patient ID: Christina [name] MRN: [id number] DOB/AGE: 13-Aug-1960 55 y.o.  Admit date: 06/17/2016 Discharge date: 06/21/2016  Admission Diagnoses: SDH   Discharge Diagnoses: same   Discharged Condition: stable  Hospital Course: The patient was admitted on 06/17/2016 and taken to the operating room where the patient underwent R craniotomy for SDH. The patient tolerated the procedure well and was taken to the recovery room and then to the ICU in stable condition. She had some mild progressive L sided weakness and follow head CT showed acute re accumulation of blood in the subdural space and the pt returned to the OR for repeat evacuation. She tolerated this well and had an improvement in sxs. The wound remained clean dry and intact. The patient remained afebrile with stable vital signs, and tolerated a regular diet. The patient continued to increase activities, and pain was well controlled with oral pain medications.   Consults: None  Significant Diagnostic Studies:  Results for orders placed or performed during the hospital encounter of 06/17/16  MRSA PCR Screening  Result Value Ref Range   MRSA by PCR NEGATIVE NEGATIVE  CBC with Differential  Result Value Ref Range   WBC 6.7 4.0 - 10.5 K/uL   RBC 3.59 (L) 3.87 - 5.11 MIL/uL   Hemoglobin 11.1 (L) 12.0 - 15.0 g/dL   HCT 33.1 (L) 36.0 - 46.0 %   MCV 92.2 78.0 - 100.0 fL   MCH 30.9 26.0 - 34.0 pg   MCHC 33.5 30.0 - 36.0 g/dL   RDW 13.7 11.5 - 15.5 %   Platelets 236 150 - 400 K/uL   Neutrophils Relative % 71 %   Neutro Abs 4.8 1.7 - 7.7 K/uL   Lymphocytes Relative 23 %   Lymphs Abs 1.5 0.7 - 4.0 K/uL   Monocytes Relative 6 %   Monocytes Absolute 0.4 0.1 - 1.0 K/uL   Eosinophils Relative 0 %   Eosinophils Absolute 0.0 0.0 - 0.7 K/uL   Basophils Relative 0 %   Basophils Absolute 0.0 0.0 - 0.1 K/uL  Comprehensive metabolic panel  Result Value Ref Range   Sodium 137 135 - 145 mmol/L   Potassium 4.0 3.5 -  5.1 mmol/L   Chloride 106 101 - 111 mmol/L   CO2 23 22 - 32 mmol/L   Glucose, Bld 104 (H) 65 - 99 mg/dL   BUN 15 6 - 20 mg/dL   Creatinine, Ser 0.80 0.44 - 1.00 mg/dL   Calcium 9.1 8.9 - 10.3 mg/dL   Total Protein 6.6 6.5 - 8.1 g/dL   Albumin 3.8 3.5 - 5.0 g/dL   AST 26 15 - 41 U/L   ALT 28 14 - 54 U/L   Alkaline Phosphatase 44 38 - 126 U/L   Total Bilirubin 0.2 (L) 0.3 - 1.2 mg/dL   GFR calc non Af Amer >60 >60 mL/min   GFR calc Af Amer >60 >60 mL/min   Anion gap 8 5 - 15  Protime-INR  Result Value Ref Range   Prothrombin Time 12.2 11.4 - 15.2 seconds   INR 0.90   APTT  Result Value Ref Range   aPTT 32 24 - 36 seconds    Dg Chest 2 View  Result Date: 06/17/2016 CLINICAL DATA:  Preoperative chest x-ray . EXAM: CHEST  2 VIEW COMPARISON:  06/29/2015. FINDINGS: Mediastinum and hilar structures are normal. Mild bibasilar atelectasis, right side greater than left. Cardiomegaly with normal pulmonary vascularity. No pleural effusion or pneumothorax. Stable elevation right  hemidiaphragm. IMPRESSION: 1. Mild bibasilar atelectasis, right side greater than left. Stable elevation right hemidiaphragm. 2. Cardiomegaly.  No pulmonary venous congestion. Electronically Signed   By: Marcello Moores  Register   On: 06/17/2016 13:34   Ct Head Wo Contrast  Result Date: 06/20/2016 CLINICAL DATA:  55 year old female with intracranial hemorrhage post surgery. Subsequent encounter. EXAM: CT HEAD WITHOUT CONTRAST TECHNIQUE: Contiguous axial images were obtained from the base of the skull through the vertex without intravenous contrast. COMPARISON:  06/18/2016 and 06/17/2016 CT. FINDINGS: Brain: Post right frontal craniotomy for drainage of right-sided subdural collection with repositioning of drainage catheter. Decrease in size of right-sided subdural hematoma. Residual moderately large right convexity subdural hematoma with maximal thickness of 1.8 cm versus prior maximal thickness of 2.1 cm. Mass effect upon the  right lateral ventricle which is displaced inferiorly and to the right. Midline shift by 5.3 mm versus prior 8.3 mm. The largest component of the residual subdural collection is located posterior to the repositioned right frontal drainage catheter. Small amount of pneumocephalus. No evidence of thrombotic infarct. No intracranial mass lesion separate from above described findings. Vascular: No acute abnormality. Skull: Postsurgical changes. Sinuses/Orbits: No acute orbital abnormality. Prior maxillary sinus surgery. Other: Negative IMPRESSION: Post right frontal craniotomy for drainage of right-sided subdural collection with repositioning of drainage catheter. Decrease in size of right-sided subdural hematoma. Residual moderately large right convexity subdural hematoma with maximal thickness of 1.8 cm versus prior maximal thickness of 2.1 cm. Mass effect upon the right lateral ventricle which is displaced inferiorly and to the right. Midline shift by 5.3 mm versus prior 8.3 mm. The largest component of the residual subdural collection is located posterior to the repositioned right frontal drainage catheter. Electronically Signed   By: Genia Del M.D.   On: 06/20/2016 07:02   Ct Head Wo Contrast  Result Date: 06/18/2016 CLINICAL DATA:  Subdural hematoma EXAM: CT HEAD WITHOUT CONTRAST TECHNIQUE: Contiguous axial images were obtained from the base of the skull through the vertex without intravenous contrast. COMPARISON:  CT head 06/18/2016 FINDINGS: Brain: Right frontal craniotomy. Subdural drain has been removed. Subcutaneous drain remains in place overlying the skull flap. Mild pneumocephalus anteriorly. Residual high density subdural hematoma is unchanged. This is most prominent over the convexity in the parietal region were it measures approximately 21 mm in thickness on coronal imaging, unchanged. There is significant mass-effect on the underlying brain. Small subdural hematoma extends along the orbital  roof on the right. No new hemorrhage. 8 mm midline shift is unchanged. Negative for hydrocephalus. No acute ischemic infarct. Vascular: Negative for dense MCA Skull: Right frontal craniotomy. Sinuses/Orbits: Wires in the maxillary sinus from prior surgery bilaterally. Other: None IMPRESSION: Large high density subdural hematoma over the right convexity is unchanged. Subdural drain has been removed in the interval. 8 mm midline shift to the left is unchanged from earlier today. Electronically Signed   By: Franchot Gallo M.D.   On: 06/18/2016 20:59   Ct Head Wo Contrast  Result Date: 06/18/2016 CLINICAL DATA:  Followup subdural hematoma. EXAM: CT HEAD WITHOUT CONTRAST TECHNIQUE: Contiguous axial images were obtained from the base of the skull through the vertex without intravenous contrast. COMPARISON:  06/17/2016 FINDINGS: Brain: Interval right frontal craniotomy for subdural evacuation. Subdural drain is in place running from front to back. Previously seen next density but predominantly low-density subdural is thin error in the frontal region, but at the right parietal vertex shows the presence of acute hemorrhage, now hyper dense. In the posterior  aspect only, there is slightly more mass effect with indentation of the brain. Maximal thickness is 22 mm in that region compared to 20 mm previously. Overall, the degree of mass effect is less with right-to-left shift of 7.4 mm as opposed to 13 mm yesterday. Small amount of subdural blood is no evident along both sides of the falx. No intraparenchymal hemorrhage. No sign of ischemic infarction. Vascular: No acute vascular finding. Skull: Postoperative changes.  No unexpected finding. Sinuses/Orbits: Negative Other: None significant IMPRESSION: Interval right frontal craniotomy for right subdural evacuation. Subdural drain in place. There has been acute hemorrhage into the subdural collection which is previously primarily low-density. In general, there is less mass  effect with right-to-left shift reduced from 13 mm to 17 mm. The collection is slightly bulky air at the posterior superior aspect, measuring 22 mm as opposed to 20 mm in that region. I am in the process of calling this report. Electronically Signed   By: Nelson Chimes M.D.   On: 06/18/2016 07:09   Ct Head Wo Contrast  Result Date: 06/17/2016 CLINICAL DATA:  Persistent headache, fever EXAM: CT HEAD WITHOUT CONTRAST TECHNIQUE: Contiguous axial images were obtained from the base of the skull through the vertex without intravenous contrast. COMPARISON:  CT orbit of 03/19/2005 FINDINGS: Brain: There is a chronic right subdural hematoma with some acute components. This hematoma measures 2.1 cm in depth on image 23. There is compression upon the right frontotemporal region with shift of the midline septum to the left by approximately 13 mm. No evidence of infarction or intraparenchymal hemorrhage is seen. Vascular: No vascular abnormality is seen on this unenhanced study. Skull: No calvarial abnormality is noted. Sinuses/Orbits: The paranasal sinuses appear pneumatized. The orbital rims are intact. Other: None IMPRESSION: 1. 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 2. No acute intraparenchymal abnormality. Electronically Signed   By: Ivar Drape M.D.   On: 06/17/2016 10:12   Ct Maxillofacial Limited Wo Contrast  Result Date: 06/17/2016 CLINICAL DATA:  Facial pain and pressure for several weeks, persistent headache EXAM: CT PARANASAL SINUS LIMITED WITHOUT CONTRAST TECHNIQUE: Non-contiguous multidetector CT images of the paranasal sinuses were obtained in a single plane without contrast. COMPARISON:  CT brain scan of 08/08/2014 FINDINGS: No definite sinusitis is seen. Only mild mucosal thickening is present in the floor of the right maxillary sinus. Sutures are noted in the  floor both maxillary sinuses from prior surgical intervention. The medial wall of the maxillary sinus appears to be absent, and the patient may have undergone medial antrostomy on the right. The nasal turbinates are normal in size and position and the nasal airway is patent. No bony abnormality is seen. No soft tissue abnormality is evident. IMPRESSION: 1. No definite sinusitis. Mild mucosal thickening in the floor of the right maxillary sinus. 2. Question of prior right medial antrostomy with apparent absence of the medial wall of the right maxillary sinus. Electronically Signed   By: Ivar Drape M.D.   On: 06/17/2016 10:06    Antibiotics:  Anti-infectives    Start     Dose/Rate Route Frequency Ordered Stop   06/19/16 0400  ceFAZolin (ANCEF) IVPB 1 g/50 mL premix  Status:  Discontinued     1 g 100 mL/hr over 30 Minutes Intravenous Every 8 hours 06/19/16 0034 06/19/16 1012   06/18/16 2200  bacitracin 50,000  Units in sodium chloride irrigation 0.9 % 500 mL irrigation  Status:  Discontinued       As needed 06/18/16 2200 06/18/16 2310   06/17/16 2330  ceFAZolin (ANCEF) IVPB 1 g/50 mL premix     1 g 100 mL/hr over 30 Minutes Intravenous Every 8 hours 06/17/16 1923 06/18/16 0722   06/17/16 1559  bacitracin 50,000 Units in sodium chloride irrigation 0.9 % 500 mL irrigation  Status:  Discontinued       As needed 06/17/16 1559 06/17/16 1659   06/17/16 1450  ceFAZolin (ANCEF) 2-4 GM/100ML-% IVPB    Comments:  Kerrie Pleasure   : cabinet override      06/17/16 1450 06/17/16 1500      Discharge Exam: Blood pressure 138/82, pulse 84, temperature 97.7 F (36.5 C), temperature source Oral, resp. rate 20, height 5\' 8"  (1.727 m), weight 81.6 kg (180 lb), SpO2 100 %. Neurologic: Grossly normal with very mild drift on L that is much improved Incision CDI  Discharge Medications:     Medication List    STOP taking these medications   aspirin 81 MG tablet   ibuprofen 200 MG tablet Commonly known as:   ADVIL,MOTRIN   zolpidem 10 MG tablet Commonly known as:  AMBIEN     TAKE these medications   amoxicillin 875 MG tablet Commonly known as:  AMOXIL Take 1 tablet (875 mg total) by mouth 2 (two) times daily.   dasatinib 100 MG tablet Commonly known as:  SPRYCEL Take 1 tablet (100 mg total) by mouth daily.   levETIRAcetam 500 MG tablet Commonly known as:  KEPPRA Take 1 tablet (500 mg total) by mouth 2 (two) times daily.   lisinopril 20 MG tablet Commonly known as:  PRINIVIL,ZESTRIL Take 1 tablet (20 mg total) by mouth daily.   SYNTHROID 150 MCG tablet Generic drug:  levothyroxine Take 1 tablet (150 mcg total) by mouth daily before breakfast.   verapamil 240 MG CR tablet Commonly known as:  CALAN-SR Take 1 tablet (240 mg total) by mouth at bedtime.       Disposition: home   Final Dx: R crani for SDH  Discharge Instructions    Call MD for:  difficulty breathing, headache or visual disturbances    Complete by:  As directed    Call MD for:  persistant nausea and vomiting    Complete by:  As directed    Call MD for:  redness, tenderness, or signs of infection (pain, swelling, redness, odor or green/yellow discharge around incision site)    Complete by:  As directed    Call MD for:  severe uncontrolled pain    Complete by:  As directed    Call MD for:  temperature >100.4    Complete by:  As directed    Diet - low sodium heart healthy    Complete by:  As directed    Discharge instructions    Complete by:  As directed    No driving or strenuous activity, may shower   Increase activity slowly    Complete by:  As directed    No wound care    Complete by:  As directed       Follow-up Information    JONES,DAVID S, MD. Schedule an appointment as soon as possible for a visit in 2 week(s).   Specialty:  Neurosurgery Contact information: [address] [place] [address] [phone]  SignedEustace Moore 06/21/2016, 8:17  AM

## 2016-06-21 NOTE — Consult Note (Signed)
   [facility] CM Inpatient Consult   06/21/2016  CALDER RAMLALL 02-15-1961 [id number]    Patient screened for  Link to [facility] Care Management for [employer] employees/dependents with [facility] insurance. However, she was discharged prior to bedside visit. Will request for post discharge call.    Marthenia Rolling, MSN-Ed, RN,BSN [facility] Liaison [phone]

## 2016-06-21 NOTE — Progress Notes (Signed)
Physical Therapy Treatment Patient Details Name: Christina [name] MRN: [id number] DOB: 19-Jan-1961 Today's Date: 06/21/2016    History of Present Illness Patient is a 55 yo female admitted 06/17/16 with headache x 5 weeks.  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  Patient s/p craniotomy and evacuation of SDH on 06/17/16.  s/p Repeat right craniotomy for evacuation of acute subdural hematoma.  PMH: Graves' Disease, HTN    PT Comments    Patient progressing well with mobility. Still with mild left inattention but more aware of left side today and manipulating the environment more safely. Tolerated higher level balance challenges but is still a fall risk due to DGI score of 18. Tolerated stair training with Min A for balance/safety. Pt needs cues for safety throughout mobility. Discussed safety at home- slowing down pace, not rushing, night light for night time bathroom trips, attending to left side etc. Recommend OPPT. Will follow.   Follow Up Recommendations  Supervision for mobility/OOB;Outpatient PT     Equipment Recommendations  None recommended by PT    Recommendations for Other Services       Precautions / Restrictions Precautions Precautions: Fall Restrictions Weight Bearing Restrictions: No    Mobility  Bed Mobility               General bed mobility comments: Up in chair.  Transfers Overall transfer level: Needs assistance Equipment used: None Transfers: Sit to/from Stand Sit to Stand: Min guard         General transfer comment: Min guard for safety. Stood from chair. No dizziness.   Ambulation/Gait Ambulation/Gait assistance: Min guard Ambulation Distance (Feet): 300 Feet Assistive device: None Gait Pattern/deviations: Step-through pattern;Decreased stride length   Gait velocity interpretation: Below normal speed for age/gender General Gait Details: Continues to veer left during ambulation but does not bump into anything- more aware  today. Able to perform higher level balance without LOB. See balance section.   Stairs            Wheelchair Mobility    Modified Rankin (Stroke Patients Only)       Balance Overall balance assessment: Needs assistance Sitting-balance support: Feet supported;No upper extremity supported Sitting balance-Leahy Scale: Good     Standing balance support: During functional activity Standing balance-Leahy Scale: Fair               High level balance activites: Head turns;Sudden stops;Direction changes High Level Balance Comments: Able to perform above with only mild deviations in gait but no overt LOB.    Cognition Arousal/Alertness: Awake/alert Behavior During Therapy: WFL for tasks assessed/performed Overall Cognitive Status: Impaired/Different from baseline Area of Impairment: Following commands;Problem solving       Following Commands: Follows one step commands consistently Safety/Judgement: Decreased awareness of safety   Problem Solving: Requires verbal cues General Comments: Mild left inattention. Repetition required at times for multi step instructions.    Exercises Other Exercises Other Exercises: Back to see pt for FM/GM activties with husband present to see what activties I have her doing. She was able to tell him that she is to do the theraputty exercises at least 3 times a day. She was also able to tell him some of the other activies that she is suppose to do from the other sheet as she was doing the theraputty activities. She then showed him the other activities taht she is suppose to do on the sheet and talked about items that they need to get. Of note  her finger to thumb opposition for middle finger on left hand was much improved with eyes shut.    General Comments General comments (skin integrity, edema, etc.): Spouse present during session.       Pertinent Vitals/Pain Pain Assessment: No/denies pain    Home Living                       Prior Function            PT Goals (current goals can now be found in the care plan section) Progress towards PT goals: Progressing toward goals    Frequency    Min 4X/week      PT Plan Current plan remains appropriate    Co-evaluation             End of Session Equipment Utilized During Treatment: Gait belt Activity Tolerance: Patient tolerated treatment well Patient left: in chair;with call bell/phone within reach;with family/visitor present     Time: 1036-1100 PT Time Calculation (min) (ACUTE ONLY): 24 min  Charges:  $Gait Training: 8-22 mins $Neuromuscular Re-education: 8-22 mins                    G Codes:      Nakshatra Klose A Dadrian Ballantine 06/21/2016, 11:05 AM Wray Kearns, PT, DPT [phone]

## 2016-06-28 ENCOUNTER — Telehealth: Payer: Self-pay | Admitting: Pharmacist

## 2016-06-28 ENCOUNTER — Ambulatory Visit
Admission: RE | Admit: 2016-06-28 | Discharge: 2016-06-28 | Disposition: A | Discharge status: Home / Self Care | Payer: Managed Care, Other (non HMO) | Source: Ambulatory Visit | Attending: Neurological Surgery | Admitting: Neurological Surgery

## 2016-06-28 ENCOUNTER — Other Ambulatory Visit: Payer: Self-pay | Admitting: Neurological Surgery

## 2016-06-28 ENCOUNTER — Encounter: Payer: Self-pay | Admitting: Family Medicine

## 2016-06-28 DIAGNOSIS — I6200 Nontraumatic subdural hemorrhage, unspecified: Secondary | ICD-10-CM | POA: Diagnosis not present

## 2016-06-28 DIAGNOSIS — S065XAA Traumatic subdural hemorrhage with loss of consciousness status unknown, initial encounter: Secondary | ICD-10-CM

## 2016-06-28 DIAGNOSIS — S065X9A Traumatic subdural hemorrhage with loss of consciousness of unspecified duration, initial encounter: Secondary | ICD-10-CM

## 2016-06-28 IMAGING — CT CT HEAD W/O CM
1 series · 15 of 30 positions shown, 19 images · non-contrast
Comparison: Postoperative head CT [DATE] and earlier.

CLINICAL DATA: 54-year-old female status post to recent surgery is
for subdural hematoma. New onset extreme headache and left side
neurologic deficits for 48 hours. Initial encounter.

EXAM:
CT HEAD WITHOUT CONTRAST
TECHNIQUE: Contiguous axial images were obtained from the base of the skull
through the vertex without intravenous contrast.

[Series 2: head w/(date) · axial · 0.43mm/px · z∈[-59,+96]mm · 15 of 35 slices shown, 19 images]
[im 2/35  brain]
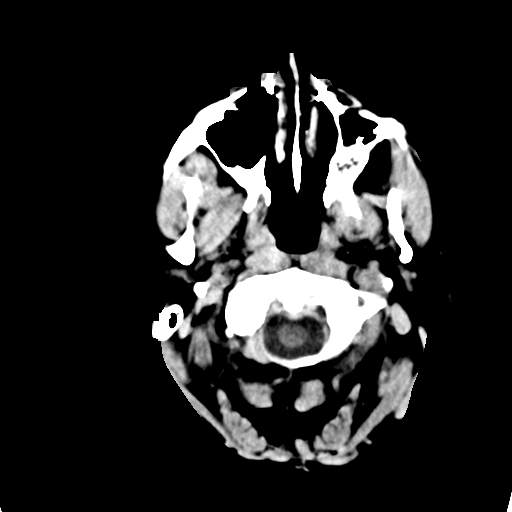
[im 2/35  bone]
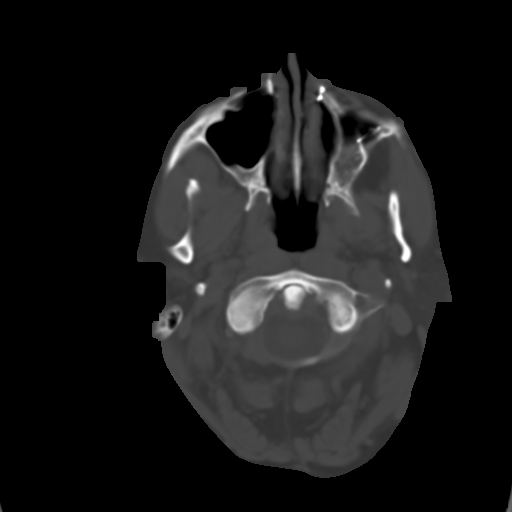
[im 4/35  brain]
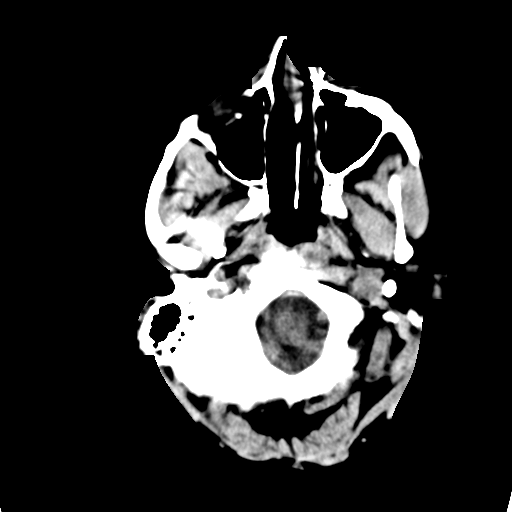
[im 6/35  brain]
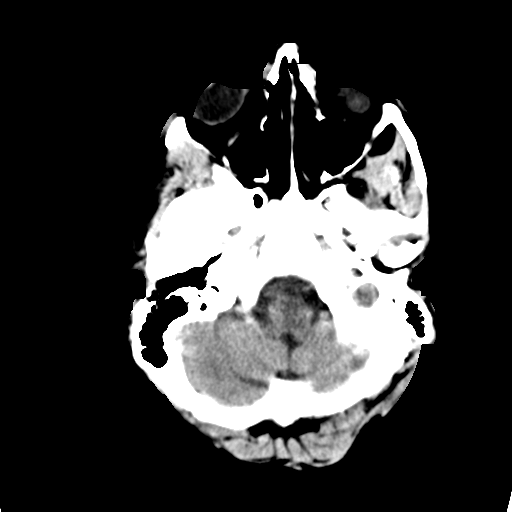
[im 9/35  brain]
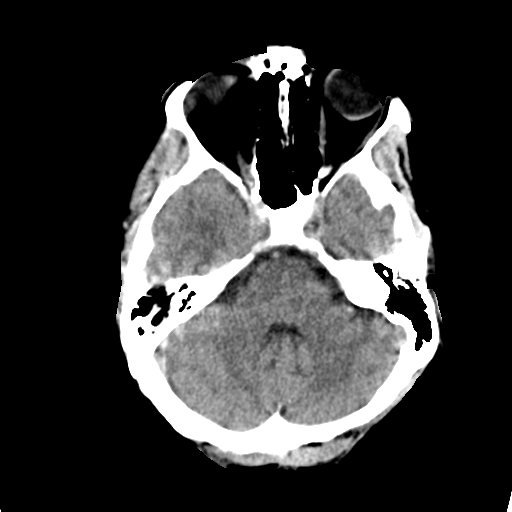
[im 11/35  brain]
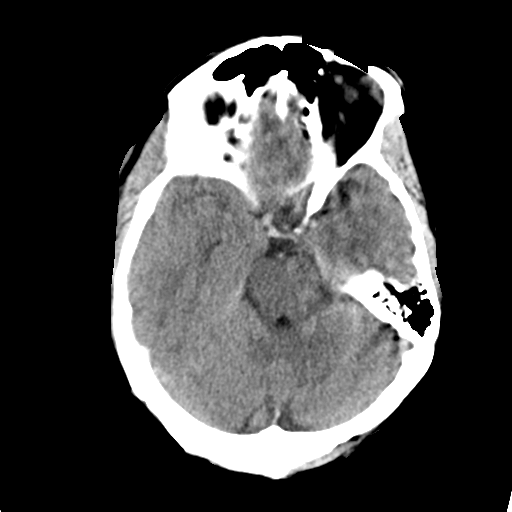
[im 11/35  bone]
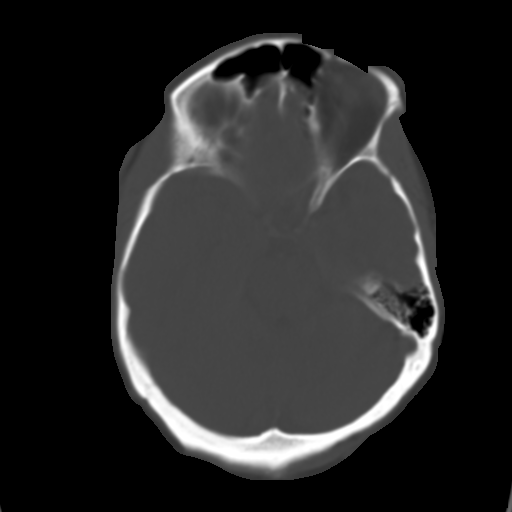
[im 13/35  brain]
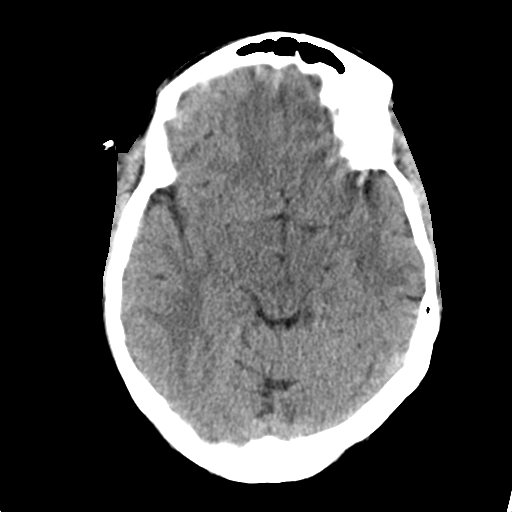
[im 16/35  brain]
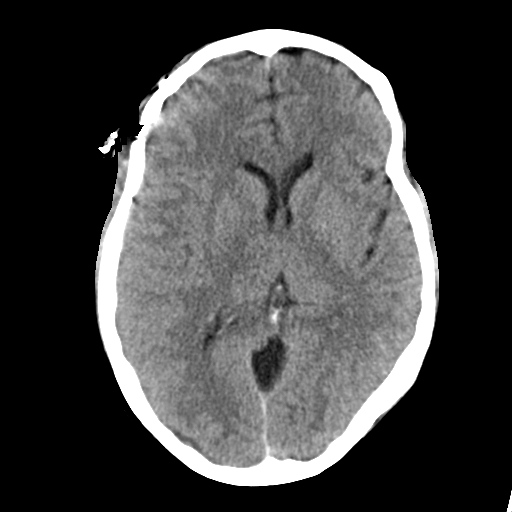
[im 18/35  brain]
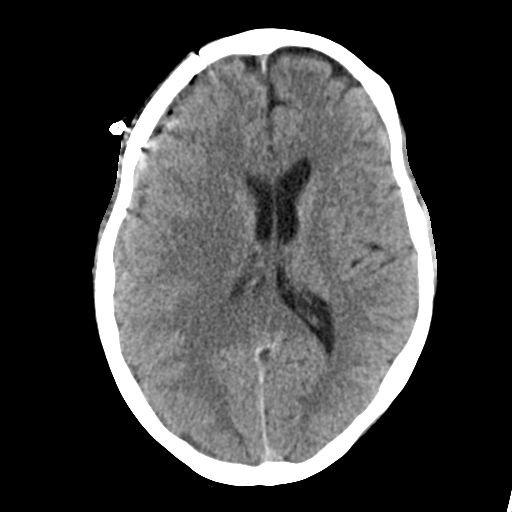
[im 19/35  brain]
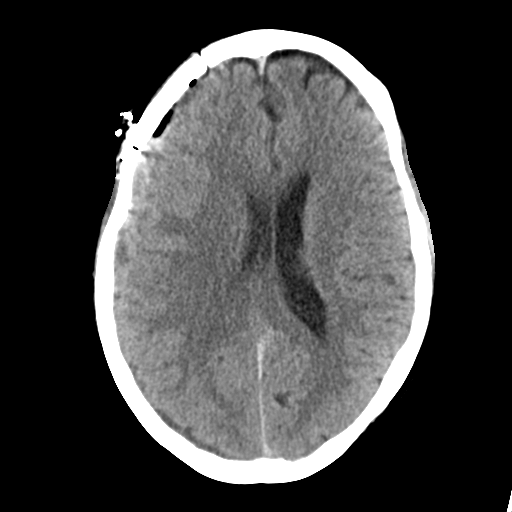
[im 19/35  bone]
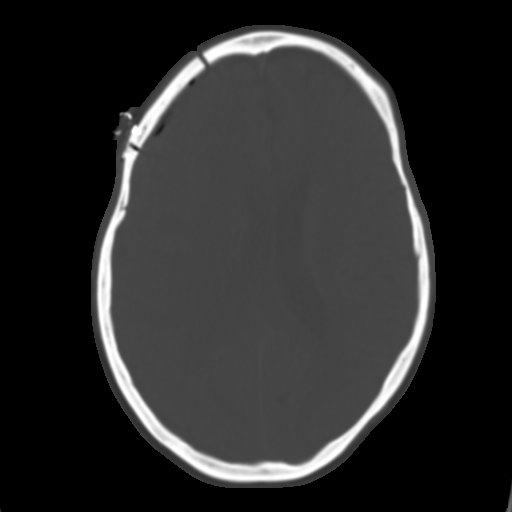
[im 22/35  brain]
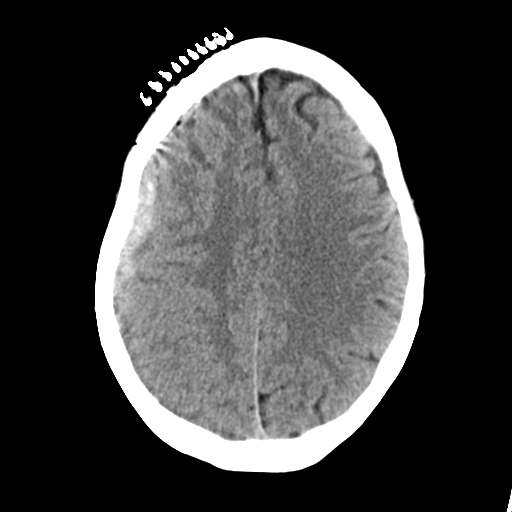
[im 24/35  brain]
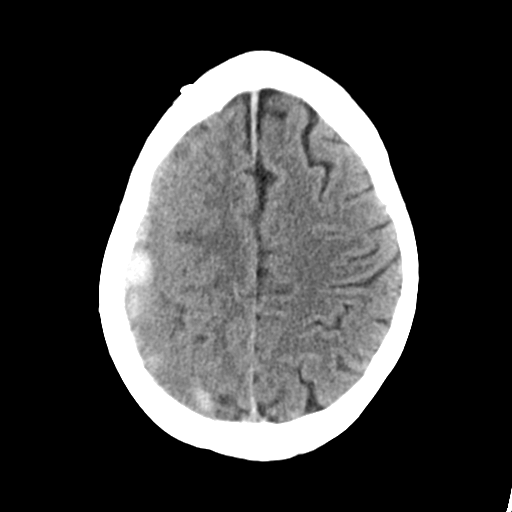
[im 26/35  brain]
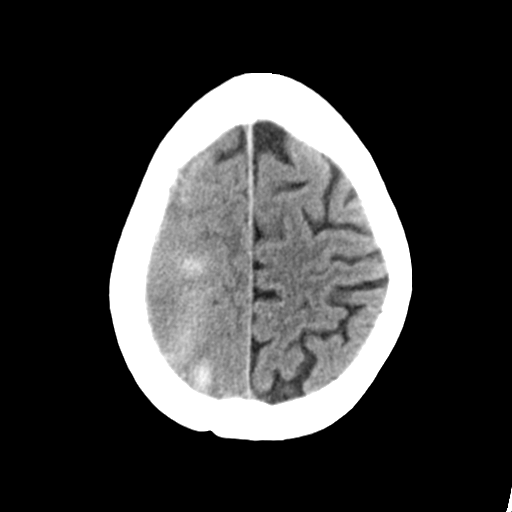
[im 29/35  brain]
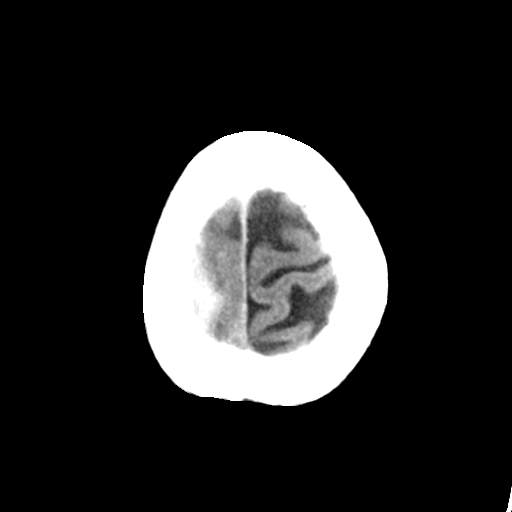
[im 29/35  bone]
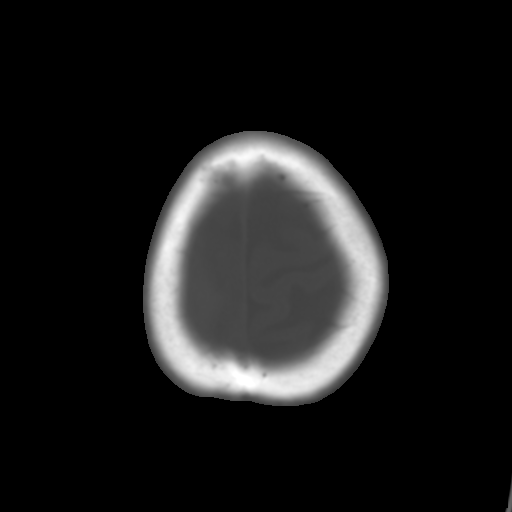
[im 31/35  brain]
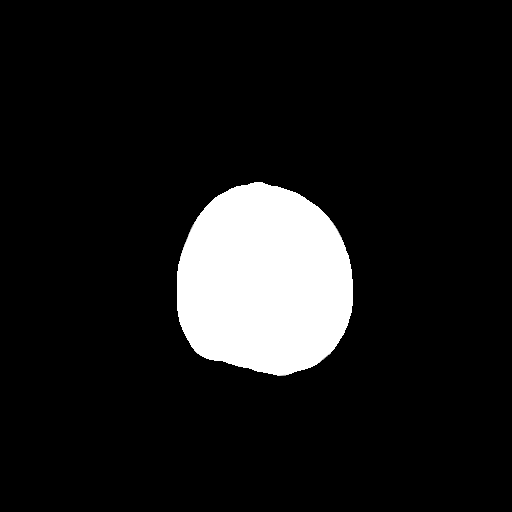
[im 33/35  brain]
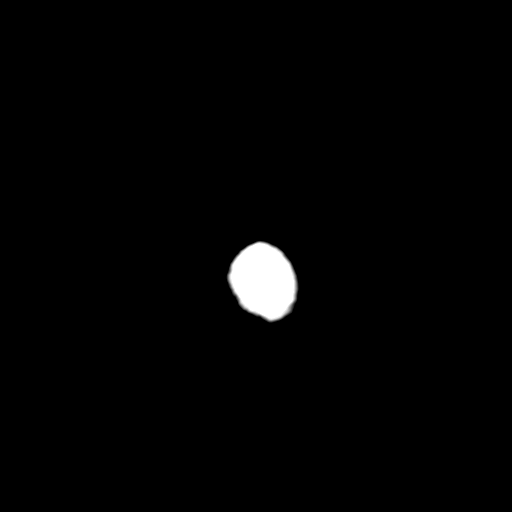

[15 of 30 positions shown; findings below may reference images not displayed]

FINDINGS: Brain: Interval removal of the right subdural drain. Mixed density
right subdural hematoma persists but appears not significantly
changed in size or configuration since [DATE]. The residual
hematoma measures up to 17 mm in thickness ( coronal image 40) but
at most levels is 12-14 mm in thickness. Mass effect on the right
hemisphere appears stable with leftward midline shift of 4 mm and
partial effacement of the right lateral ventricle.

No superimposed acute cortically based infarct or ventriculomegaly.
No new areas of intracranial hemorrhage. Stable gray-white matter
differentiation throughout the brain. Regressed pneumocephalus.

Vascular: No asymmetric or suspicious intracranial vascular
hyperdensity.

Skull: Sequelae of right frontal craniotomy. Previous maxilla
reconstruction. No new osseous abnormality identified.

Sinuses/Orbits: Visualized paranasal sinuses and mastoids are stable
and well pneumatized.

Other: Postoperative changes along the right scalp. No new scalp
soft tissue abnormality. Visualized orbit soft tissues are within
normal limits.
IMPRESSION: 1. Interval right subdural drain removal. Mixed density right
subdural hematoma appears not significantly changed in size or
configuration since [DATE].
[DATE]. Stable mass effect on the right hemisphere with 4 mm of leftward
midline shift and partial effacement of the right lateral ventricle.
3. No new intracranial abnormality.

## 2016-06-28 NOTE — Telephone Encounter (Signed)
Oral Chemotherapy Pharmacist Encounter  Received notification from [facility] that patient's Sprycel prescription would require prior authorization. Noted patient has been on Sprycel since oct 2016 so this is for re-authorization. PA submitted on [email or web address], clinical questions answered, status is pending, may take up to 72h for determination. [place] [id number]  Oral Chemo Clinic will continue to follow.  Johny Drilling, PharmD, BCPS 06/28/2016  2:29 PM Oral Chemotherapy Clinic [phone]

## 2016-07-04 ENCOUNTER — Encounter: Payer: Self-pay | Admitting: *Deleted

## 2016-07-04 ENCOUNTER — Other Ambulatory Visit: Payer: Self-pay | Admitting: *Deleted

## 2016-07-04 ENCOUNTER — Ambulatory Visit: Payer: Managed Care, Other (non HMO) | Admitting: Occupational Therapy

## 2016-07-04 ENCOUNTER — Ambulatory Visit: Payer: Managed Care, Other (non HMO) | Attending: Neurological Surgery | Admitting: *Deleted

## 2016-07-04 ENCOUNTER — Encounter: Payer: Self-pay | Admitting: Occupational Therapy

## 2016-07-04 DIAGNOSIS — R278 Other lack of coordination: Secondary | ICD-10-CM | POA: Insufficient documentation

## 2016-07-04 DIAGNOSIS — R41844 Frontal lobe and executive function deficit: Secondary | ICD-10-CM | POA: Diagnosis present

## 2016-07-04 DIAGNOSIS — R2689 Other abnormalities of gait and mobility: Secondary | ICD-10-CM | POA: Insufficient documentation

## 2016-07-04 DIAGNOSIS — R29818 Other symptoms and signs involving the nervous system: Secondary | ICD-10-CM | POA: Insufficient documentation

## 2016-07-04 DIAGNOSIS — I69254 Hemiplegia and hemiparesis following other nontraumatic intracranial hemorrhage affecting left non-dominant side: Secondary | ICD-10-CM | POA: Insufficient documentation

## 2016-07-04 DIAGNOSIS — M6281 Muscle weakness (generalized): Secondary | ICD-10-CM | POA: Insufficient documentation

## 2016-07-04 DIAGNOSIS — R41842 Visuospatial deficit: Secondary | ICD-10-CM

## 2016-07-04 DIAGNOSIS — R482 Apraxia: Secondary | ICD-10-CM

## 2016-07-04 DIAGNOSIS — R2681 Unsteadiness on feet: Secondary | ICD-10-CM | POA: Diagnosis present

## 2016-07-04 MED FILL — SPRYCEL 100 MG TABLET: 100 | 90 days supply | Qty: 90 | Fill #1

## 2016-07-04 NOTE — Therapy (Signed)
[facility] [address] [place], [place], [address] Phone: [phone]   Fax:  [phone]  Occupational Therapy Evaluation  Patient Details  Name: Christina [name] MRN: [id number] Date of Birth: 1961/06/26 Referring Provider: Sherley Bounds  Encounter Date: 07/04/2016      OT End of Session - 07/04/16 1039    Visit Number 1   Number of Visits 16   Date for OT Re-Evaluation 08/29/16   Authorization Type UMR no  visit limit   OT Start Time 0845   OT Stop Time 0931   OT Time Calculation (min) 46 min   Activity Tolerance Patient tolerated treatment well      Past Medical History:  Diagnosis Date  . Allergic rhinitis   . Fibroid   . Fibroids   . Grave's disease   . H/O Graves' disease   . H/O insomnia   . H/O menorrhagia 2012  . History of chicken pox   . Hypertension   . Hyperthyroidism    s/p rai 8/06 now hypothyroid Dr. Suzette Battiest  . Hypothyroid   . Infertility, female   . Leukemia ([place])   . Menorrhagia    Had Novasure  . 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 Polyp of cervix    Endometriel polyp. JO  . Vitamin D deficiency   . Vitamin D deficiency     Past Surgical History:  Procedure Laterality Date  . BUNIONECTOMY  1986  . CRANIOTOMY N/A 06/17/2016   Procedure: RIGHT CRANIOTOMY HEMATOMA EVACUATION SUBDURAL;  Surgeon: Eustace Moore, MD;  Location: [facility];  Service: Neurosurgery;  Laterality: N/A;  . CRANIOTOMY Right 06/18/2016   Procedure: CRANIOTOMY HEMATOMA EVACUATION SUBDURAL RIGHT;  Surgeon: Eustace Moore, MD;  Location: [facility];  Service: Neurosurgery;  Laterality: Right;  . DILATION AND CURETTAGE OF UTERUS  09/15/2010  . HYSTEROSCOPY W/D&C  09/28/2010  . MOUTH SURGERY    . NOVASURE ABLATION  09/28/2010  . REFRACTIVE SURGERY     for vision  . TONSILLECTOMY  1965    There were no vitals filed for this visit.      Subjective Assessment - 07/04/16 0847    Patient is accompained by:  Family member  husband Annie Main   Currently in Pain? Yes   Pain Score 3    Pain Location Head   Pain Orientation Right   Pain Descriptors / Indicators Aching   Pain Type Acute pain   Pain Onset 1 to 4 weeks ago   Pain Frequency Intermittent   Aggravating Factors  unkown   Pain Relieving Factors tylenol           [facility] OT Assessment - 07/04/16 0848      Assessment   Diagnosis R frontal SDH/crani   Referring Provider Sherley Bounds   Onset Date 06/12/16   Prior Therapy Acute PT, ST      Precautions   Precautions Other (comment)   Precaution Comments No driving     Restrictions   Weight Bearing Restrictions No     Balance Screen   Has the patient fallen in the past 6 months No     Home  Environment   Family/patient expects to be discharged to: Private residence   Living Arrangements Spouse/significant other  15 year old son   Available Help at Discharge Available 24 hours/day  between husband and mother   Type of [place] Two level   Bathroom Building control surveyor  Insurance account manager with back,      Prior Function   Level of Independence Independent   Vocation Full time employment   Education officer, community   Leisure homemaking, golf     ADL   Eating/Feeding Minimal assistance  for cutting   Grooming Modified independent  increased difficulty opening containers, not using LUE much   Upper Body Bathing Minimal assistance  husband helps pt dry back   Lower Body Bathing Supervision/safety   Upper Body Dressing Maximal assistance  husband doing everything at this point   Lower Body Dressing Maximal assistance   Toilet Tranfer Modified independent  min a with certain pants   Toileting - Clothing Manipulation Minimal assistance  with certain pants   Toileting -  Hygiene Modified Independent   Tub/Shower Transfer Minimal assistance   ADL comments Husband completely dressing pt at this  time however pt is able to participate more at this time.      IADL   Shopping Assistance for transportation   Light Housekeeping Does not participate in any housekeeping tasks   Meal Prep --  can get herself a drink only   Medication Management Is not capable of dispensing or managing own medication     Mobility   Mobility Status Independent     Written Expression   Dominant Hand Right     Vision - History   Baseline Vision Wears glasses only for reading   Additional Comments Pt denies visual changes     Activity Tolerance   Activity Tolerance Tolerates 30 min activity with muliple rests     Cognition   Overall Cognitive Status Within Functional Limits for tasks assessed   Mini Mental State Exam  Husband and pt report that pt is at baseline. Pt appears delayed and slowed to this therapist. Will monitor.      Sensation   Light Touch Impaired by gross assessment   Hot/Cold Appears Intact   Proprioception Impaired by gross assessment     Coordination   Gross Motor Movements are Fluid and Coordinated No   Fine Motor Movements are Fluid and Coordinated No   9 Hole Peg Test Left   Left 9 Hole Peg Test 1 in 1.30 seconds   Box and Blocks 14 LUE     Perception   Perception Impaired   Inattention/Neglect Other (comments)  L field and body neglect     Praxis   Praxis Impaired     Tone   Assessment Location Left Upper Extremity     ROM / Strength   AROM / PROM / Strength AROM;Strength     AROM   Overall AROM  Deficits   Overall AROM Comments limited in full supination to 75% range     Strength   Overall Strength Deficits     Hand Function   Right Hand Gross Grasp Functional   Right Hand Grip (lbs) 58   Left Hand Gross Grasp Impaired   Left Hand Grip (lbs) 25     LUE Tone   LUE Tone Hypertonic                           OT Short Term Goals - 07/04/16 1021      OT SHORT TERM GOAL #1   Title Pt and husband will be mod I with HEP -  08/01/2016   Status New     OT SHORT TERM GOAL #  2   Title Pt will be min a with dressing UB   Status New     OT SHORT TERM GOAL #3   Title Pt will be mod a with dressing LB   Status New     OT SHORT TERM GOAL #4   Title Pt will be mod I  for bathing     OT SHORT TERM GOAL #5   Title Pt will demonstate improved grip strength by at least 5 pounds (baseline= 25) to assist with functional tasks   Status New     Additional Short Term Goals   Additional Short Term Goals Yes     OT SHORT TERM GOAL #6   Title Pt will be mod I with organizing meds   Status New     OT SHORT TERM GOAL #7   Title Pt will be mod I with tub transfers   Status New     OT SHORT TERM GOAL #8   Title Pt will improve on Box and blocks by 5 blocks (baseline=14) with LUE as evidence of improved UE function     OT SHORT TERM GOAL  #9   TITLE Pt will attend to L field with min vc's.           OT Long Term Goals - 07/04/16 1026      OT LONG TERM GOAL #1   Title Pt will be mod I with HEP - 08/29/2016   Status New     OT LONG TERM GOAL #2   Title Pt will be mod I with dressing   Status New     OT LONG TERM GOAL #3   Title Pt will improve grip strength RUE by 8 pounds to assist with functional tasks (baseline= 25)   Status New     OT LONG TERM GOAL #4   Title Pt will improve on Box and Blocks by at least 8 blocks as evidence of improved UE function   Status New     OT LONG TERM GOAL #5   Title Pt will be mod I with simple familiar meal prep   Status New     OT LONG TERM GOAL #6   Title Pt will demonstrate ability to lift 3 pound object overhead 5 times without dropping with LUE   Status New     OT LONG TERM GOAL #7   Title Pt will demonstate ability to carry plate of food from counter to table with LUE   Status New     OT LONG TERM GOAL #8   Title Pt will be able to complete 9 hole peg test in 2 minutes or less as demonstration of improved coordination   Status New     OT LONG TERM GOAL   #9   Baseline Pt will be mod I with meds (including opening containers)   Status New     OT LONG TERM GOAL  #10   TITLE Pt will be mod I with simple laundry, clean up tasks in home.               Plan - 07/04/16 1032    Clinical Impression Statement Pt is a 55 year old female with chronic R frontal SDH, s/p craniotomy on 06/12/2016. Pt hospitalized from 06/12/2016-06/21/2016 then discharged home.  PMH: Graves disease, HTN, hearing loss.  Pt presents today with the following deficits that impact indepdendence in ADL;s, IADL;s, leisure and return to work:  L hemiplegia, altered tone, altered  sensation, decreased coordination of LUE, decreased functional use, decreased strength LUE, decreased grip strength LUE, decreaesd processing of information, decreased activity tolerance, apraxia, L neglect, decreased AROM (supination). Cognition to be further monitored addressed as needed.  Pt will benefit from skilled OT to address these deficits and maximize independence.    Rehab Potential Good   OT Frequency 2x / week   OT Duration 8 weeks   OT Treatment/Interventions Self-care/ADL training;Moist Heat;Electrical Stimulation;Fluidtherapy;DME and/or AE instruction;Energy conservation;Neuromuscular education;Therapeutic exercise;Functional Mobility Training;Manual Therapy;Passive range of motion;Splinting;Therapeutic activities;Patient/family education;Visual/perceptual remediation/compensation;Cognitive remediation/compensation   Plan MOCA, review and modify HEP   Consulted and Agree with Plan of Care Patient;Family member/caregiver   Family Member Consulted husband Annie Main      Patient will benefit from skilled therapeutic intervention in order to improve the following deficits and impairments:  Decreased activity tolerance, Decreased endurance, Decreased coordination, Decreased cognition, Decreased knowledge of use of DME, Decreased range of motion, Decreased strength, Impaired UE functional use,  Impaired tone, Impaired sensation, Impaired vision/preception  Visit Diagnosis: 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 S/P craniotomy 06/17/2016  . Pancytopenia due to antineoplastic chemotherapy ([place]) 06/30/2015  . Elevated diaphragm 06/30/2015  . DOE (dyspnea on exertion) 06/30/2015  . Diarrhea due to drug 06/30/2015  . CML (chronic myeloid leukemia) ([place]) 05/18/2015  . Encounter for chemotherapy management 05/18/2015  . 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 Hyperuricemia 05/15/2015  . Essential hypertension 11/27/2014  . Shortness of breath 12/03/2013  . Hx of Graves' disease 12/03/2013  . Elevated blood pressure reading 12/03/2013  . Special screening for malignant neoplasms, colon 08/09/2013  . Insomnia 04/29/2012  . Medication management 04/29/2012  . Foot pain 04/29/2012  . Polyp of cervix   . Fibroids   . Vitamin D deficiency   . Cubital tunnel syndrome 09/05/2011  . TENDINITIS 12/19/2008  . HYPERTHYROIDISM 03/06/2007  . ARTHRALGIA 03/05/2007  . INSOMNIA 03/05/2007  . [place] DISEASE 03/02/2007  . LOSS, HEARING NOS 03/02/2007  . Allergic rhinitis 03/02/2007    Quay Burow, OTR/L 07/04/2016, 10:45 AM  [facility] [address] [place], [place], [address] Phone: [phone]   Fax:   [phone]  Name: Christina [name] MRN: [id number] Date of Birth: 07/08/1961

## 2016-07-04 NOTE — Therapy (Signed)
[facility] [address] [place], [place], [address] Phone: [phone]   Fax:  [phone]  Physical Therapy Evaluation  Patient Details  Name: Christina [name] MRN: [id number] Date of Birth: 02-28-1961 Referring Provider: Sherley Bounds  Encounter Date: 07/04/2016      PT End of Session - 07/04/16 1827    Visit Number 1   Number of Visits 9   Date for PT Re-Evaluation 08/12/16   Authorization Type Private Insurance   PT Start Time [phone]   PT Stop Time [phone]   PT Time Calculation (min) 40 min   Equipment Utilized During Treatment Gait belt   Activity Tolerance Patient limited by fatigue   Behavior During Therapy Flat affect      Past Medical History:  Diagnosis Date  . Allergic rhinitis   . Fibroid   . Fibroids   . Grave's disease   . H/O Graves' disease   . H/O insomnia   . H/O menorrhagia 2012  . History of chicken pox   . Hypertension   . Hyperthyroidism    s/p rai 8/06 now hypothyroid Dr. Suzette Battiest  . Hypothyroid   . Infertility, female   . Leukemia ([facility])   . Menorrhagia    Had Novasure  . MPN (myeloproliferative neoplasm) ([facility]) 05/18/2015  . Polyp of cervix    Endometriel polyp. JO  . Vitamin D deficiency   . Vitamin D deficiency     Past Surgical History:  Procedure Laterality Date  . BUNIONECTOMY  1986  . CRANIOTOMY N/A 06/17/2016   Procedure: RIGHT CRANIOTOMY HEMATOMA EVACUATION SUBDURAL;  Surgeon: Eustace Moore, MD;  Location: [facility];  Service: Neurosurgery;  Laterality: N/A;  . CRANIOTOMY Right 06/18/2016   Procedure: CRANIOTOMY HEMATOMA EVACUATION SUBDURAL RIGHT;  Surgeon: Eustace Moore, MD;  Location: [facility];  Service: Neurosurgery;  Laterality: Right;  . DILATION AND CURETTAGE OF UTERUS  09/15/2010  . HYSTEROSCOPY W/D&C  09/28/2010  . MOUTH SURGERY    . NOVASURE ABLATION  09/28/2010  . REFRACTIVE SURGERY     for vision  . TONSILLECTOMY  1965    There were no vitals filed for this visit.        Subjective Assessment - 07/04/16 0811    Subjective Patient presents s/p SDH with evacuation x 2 on 11/11/ and 11/12.  Was a spontaneous bleed.  Having difficulty with use of L hand and some gait issues.    Patient is accompained by: Family member   Pertinent History Spouse reports having to help pt get dressed, dried off.     Limitations Other (comment)   Currently in Pain? Yes   Pain Score 3    Pain Location Head   Pain Descriptors / Indicators Headache   Pain Type Acute pain   Pain Onset Today   Pain Frequency Intermittent   Aggravating Factors  unknown   Pain Relieving Factors tylenol            [facility] PT Assessment - 07/04/16 0001      Assessment   Medical Diagnosis subdural hematoma s/p craniotomy x 2   Referring Provider Sherley Bounds   Onset Date/Surgical Date 06/18/16   Hand Dominance Right   Next MD Visit 07/04/16   Prior Therapy at hospital     Balance Screen   Has the patient fallen in the past 6 months No   Has the patient had a decrease in activity level because of a fear of falling?  Yes  Is the patient reluctant to leave their home because of a fear of falling?  Yes     [place] Private residence   Living Arrangements Spouse/significant other   Available Help at Discharge Family;Available 24 hours/day   Type of Home House   Home Access Stairs to enter   Entrance Stairs-Number of Steps 3   Entrance Stairs-Rails Can reach both   Home Layout Two level;Bed/bath upstairs   Alternate Level Stairs-Number of Steps 13   Alternate Level Stairs-Rails Right   Home Equipment Shower seat     Prior Function   Level of Independence Independent   Vocation Full time employment   Education officer, community   Leisure homemaking, golf     Cognition   Overall Cognitive Status Within Functional Limits for tasks assessed     Sensation   Light Touch Impaired by gross assessment  decreased to light touch R hand      Coordination   Gross Motor Movements are Fluid and Coordinated No   Heel Shin Test slightly less straight on L over R      ROM / Strength   AROM / PROM / Strength AROM;Strength     AROM   Overall AROM  Within functional limits for tasks performed     Strength   Overall Strength Deficits   Overall Strength Comments L hip flexion 4-, knee extension 4-     Transfers   Transfers Sit to Stand   Sit to Stand 6: Modified independent (Device/Increase time)     Ambulation/Gait   Ambulation/Gait Yes   Ambulation/Gait Assistance 6: Modified independent (Device/Increase time)   Ambulation Distance (Feet) 150 Feet   Assistive device None   Gait Pattern Step-through pattern;Decreased stride length;Decreased trunk rotation   Ambulation Surface Level;Indoor   Gait velocity 2.83 ft/sec   Stairs Yes   Stairs Assistance 5: Supervision   Stairs Assistance Details (indicate cue type and reason) for safety   Stair Management Technique Alternating pattern;Two rails;Forwards   Number of Stairs 4   Height of Stairs 6     Balance   Balance Assessed Yes     Standardized Balance Assessment   Standardized Balance Assessment Berg Balance Test;Dynamic Gait Index;Timed Up and Go Test     Berg Balance Test   Sit to Stand Able to stand without using hands and stabilize independently   Standing Unsupported Able to stand safely 2 minutes   Sitting with Back Unsupported but Feet Supported on Floor or Stool Able to sit safely and securely 2 minutes   Stand to Sit Sits safely with minimal use of hands   Transfers Able to transfer safely, minor use of hands   Standing Unsupported with Eyes Closed Able to stand 10 seconds with supervision   Standing Ubsupported with Feet Together Able to place feet together independently and stand for 1 minute with supervision   From Standing, Reach Forward with Outstretched Arm Can reach confidently >25 cm (10")   From Standing Position, Pick up Object from [place] to  pick up shoe safely and easily   From Standing Position, Turn to Look Behind Over each Shoulder Needs supervision when turning   Turn 360 Degrees Able to turn 360 degrees safely but slowly   Standing Unsupported, Alternately Place Feet on Step/Stool Able to complete 4 steps without aid or supervision   Standing Unsupported, One Foot in Front Able to plae foot ahead of the other independently and hold 30 seconds  Standing on One Leg Able to lift leg independently and hold > 10 seconds   Total Score 46     Dynamic Gait Index   Level Surface Mild Impairment   Change in Gait Speed Moderate Impairment   Gait with Horizontal Head Turns Mild Impairment   Gait with Vertical Head Turns Mild Impairment   Gait and Pivot Turn Normal   Step Over Obstacle Mild Impairment   Step Around Obstacles Normal   Steps Mild Impairment   Total Score 17     Timed Up and Go Test   Normal TUG (seconds) 11.56   Cognitive TUG (seconds) 19.24                           PT Education - 07/04/16 1827    Education provided Yes   Education Details PT POC   Person(s) Educated Patient;Spouse   Methods Explanation             PT Long Term Goals - 07/04/16 1835      PT LONG TERM GOAL #1   Title Patient will demonstrate decreased fall risk with DGI 19 or greater.   Time 4   Period Weeks   Status New     PT LONG TERM GOAL #2   Title Patient will ambulate indoor and outdoor surfaces 1000' independent no LOB including curb negotiation.    Time 4   Period Weeks   Status New     PT LONG TERM GOAL #3   Title Patient will be independent with HEP for balance and LE strength.    Time 4   Period Weeks   Status New     PT LONG TERM GOAL #4   Title Patient will tolerate 25 minutes continuous standing or walking activities without seated rest.    Time 4   Period Weeks   Status New     PT LONG TERM GOAL #5   Title Patient will demonstrate decreased fall risk with TUG cognitive 13.5 sec  or less.   Time 4   Period Weeks   Status New               Plan - 07/04/16 1829    Clinical Impression Statement Patient presents with decreased balance, decreased gait efficiency with instability and risk for falls per Dynamic Gait Index with score 17/24 (less than 19 demonstrate fall risk with community mobility.)  She will benefit from skilled PT to address deficits and reutrn to independent mobility in the home and community.    Rehab Potential Good   PT Frequency 2x / week   PT Duration 4 weeks   PT Treatment/Interventions ADLs/Self Care Home Management;Patient/family education;Stair training;Functional mobility training;Therapeutic activities;Therapeutic exercise;Balance training;DME Instruction;Gait training;Vestibular   PT Next Visit Plan initiate HEP for LE strength, balance   Consulted and Agree with Plan of Care Patient;Family member/caregiver   Family Member Consulted spouse      Patient will benefit from skilled therapeutic intervention in order to improve the following deficits and impairments:  Abnormal gait, Decreased activity tolerance, Decreased balance, Decreased coordination, Decreased mobility, Decreased endurance, Decreased strength, Impaired perceived functional ability, Decreased safety awareness, Decreased cognition  Visit Diagnosis: Unsteadiness on feet - Plan: PT plan of care cert/re-cert  Other abnormalities of gait and mobility - Plan: PT plan of care cert/re-cert  Muscle weakness (generalized) - Plan: PT plan of care cert/re-cert     Problem List Patient Active Problem List  Diagnosis Date Noted  . S/P craniotomy 06/17/2016  . Pancytopenia due to antineoplastic chemotherapy ([place]) 06/30/2015  . Elevated diaphragm 06/30/2015  . DOE (dyspnea on exertion) 06/30/2015  . Diarrhea due to drug 06/30/2015  . CML (chronic myeloid leukemia) ([place]) 05/18/2015  . Encounter for chemotherapy management 05/18/2015  . 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  Hyperuricemia 05/15/2015  . Essential hypertension 11/27/2014  . Shortness of breath 12/03/2013  . Hx of Graves' disease 12/03/2013  . Elevated blood pressure reading 12/03/2013  . Special screening for malignant neoplasms, colon 08/09/2013  . Insomnia 04/29/2012  . Medication management 04/29/2012  . Foot pain 04/29/2012  . Polyp of cervix   . Fibroids   . Vitamin D deficiency   . Cubital tunnel syndrome 09/05/2011  . TENDINITIS 12/19/2008  . HYPERTHYROIDISM 03/06/2007  . ARTHRALGIA 03/05/2007  . INSOMNIA 03/05/2007  . [place] DISEASE 03/02/2007  . LOSS, HEARING NOS 03/02/2007  . Allergic rhinitis 03/02/2007    Reginia Naas 07/04/2016, 6:42 PM [place], [place] 07/04/2016  [facility] [address] [place], [place], [address] Phone: [phone]   Fax:  [phone]  Name: Christina [name] MRN: [id number] Date of Birth: [birth year]

## 2016-07-04 NOTE — Patient Outreach (Signed)
[facility]) Care Management  07/04/2016  Christina [name] 1960-10-15 [id number]   Subjective: Telephone call to patient's home number, no answer, left HIPAA compliant voicemail message, and requested call back.   Objective: Per chart review: Patient hospitalized 06/17/16 - 07/01/16 for Right chronic subdural hematoma. Status post Repeat right craniotomy for evacuation of acute subdural hematoma on 06/18/16.  Status post  Right frontal craniotomy for evacuation of subdural hematoma on 06/17/16.   Patient had ED visit on 06/12/16 for sore throat and left ear pain.    Patient also has a history of MPN (myeloproliferative neoplasm), hypertension, and Graves disease.    Assessment: Received UMR Transition of care follow up referral on 06/21/16.   Transition of care pending patient contact.    Plan: RNCM will call patient for 2nd telephone outreach attempt, transition of care follow up, within 10 business days.    Krithika Tome H. Annia Friendly, BSN, [place] Management [facility] Telephonic CM Phone: [phone] Fax: [phone]

## 2016-07-05 ENCOUNTER — Other Ambulatory Visit: Payer: Self-pay | Admitting: *Deleted

## 2016-07-05 NOTE — Patient Outreach (Addendum)
[facility]) Care Management  07/05/2016  Christina [name] 1961/04/18 [id number]   Subjective: Telephone call to patient's home number, no answer, left HIPAA compliant voicemail message, and requested call back.   Objective: Per chart review: Patient hospitalized 06/17/16 - 07/01/16 for Right chronic subdural hematoma. Status post Repeat right craniotomy for evacuation of acute subdural hematoma on 06/18/16.  Status post  Right frontal craniotomy for evacuation of subdural hematoma on 06/17/16.   Patient had ED visit on 06/12/16 for sore throat and left ear pain.    Patient also has a history of MPN (myeloproliferative neoplasm), hypertension, and Graves disease.    Assessment: Received UMR Transition of care follow up referral on 06/21/16.   Transition of care pending patient contact.    Plan: RNCM will call patient for 3rd telephone outreach attempt, transition of care follow up, within 10 business days.    Kimia Finan H. Annia Friendly, BSN, [place] Telephonic CM Phone: [phone]

## 2016-07-05 NOTE — Telephone Encounter (Signed)
Oral Chemotherapy Pharmacist Encounter  Received notification from [employer] that prior authorization of patient's Spry[employer] has been approved through 06/28/2017. Ref# [id number]  I will alert [facility] to re-run Rx for patient.  Johny Drilling, PharmD, BCPS, BCOP 07/05/2016  1:02 PM [facility] [phone]

## 2016-07-06 ENCOUNTER — Other Ambulatory Visit: Payer: Self-pay | Admitting: *Deleted

## 2016-07-06 ENCOUNTER — Encounter: Payer: Self-pay | Admitting: *Deleted

## 2016-07-06 NOTE — Patient Outreach (Signed)
[facility]) Care Management  07/06/2016  Christina [name] [birth year] [id number]   Subjective:Telephone call to patient's home number, no answer, left HIPAA compliant voicemail message, and requested call back.  Telephone call to patient's mobile number, no answer, left HIPAA compliant voicemail message, and requested call back.   Objective:Per chart review: Patient hospitalized 06/17/16 - 07/01/16 for Right chronic subdural hematoma. Status post Repeat right craniotomy for evacuation of acute subdural hematoma on 06/18/16. Status post Right frontal craniotomy for evacuation of subdural hematoma on 06/17/16. Patient had ED visit on 06/12/16 for sore throat and left ear pain. Patient also has a history of MPN (myeloproliferative neoplasm), hypertension, and Graves disease.   Assessment: Received UMR Transition of care follow up referral on 06/21/16. Transition of care pending patient contact.    Plan: RNCM will send patient unsuccessful outreach letter, [facility] pamphlet, and proceed with case closure if no return call, within 10 business days.    Atticus Wedin H. Annia Friendly, BSN, [facility] Telephonic CM Phone: [phone] Fax: [phone]

## 2016-07-07 ENCOUNTER — Ambulatory Visit: Payer: Managed Care, Other (non HMO) | Admitting: Occupational Therapy

## 2016-07-07 ENCOUNTER — Encounter: Payer: Self-pay | Admitting: Occupational Therapy

## 2016-07-07 DIAGNOSIS — R41842 Visuospatial deficit: Secondary | ICD-10-CM

## 2016-07-07 DIAGNOSIS — R2681 Unsteadiness on feet: Secondary | ICD-10-CM | POA: Diagnosis not present

## 2016-07-07 DIAGNOSIS — R278 Other lack of coordination: Secondary | ICD-10-CM

## 2016-07-07 DIAGNOSIS — R29818 Other symptoms and signs involving the nervous system: Secondary | ICD-10-CM

## 2016-07-07 DIAGNOSIS — R41844 Frontal lobe and executive function deficit: Secondary | ICD-10-CM

## 2016-07-07 DIAGNOSIS — I69254 Hemiplegia and hemiparesis following other nontraumatic intracranial hemorrhage affecting left non-dominant side: Secondary | ICD-10-CM

## 2016-07-07 DIAGNOSIS — R482 Apraxia: Secondary | ICD-10-CM

## 2016-07-07 DIAGNOSIS — M6281 Muscle weakness (generalized): Secondary | ICD-10-CM

## 2016-07-07 NOTE — Patient Instructions (Signed)
1. Grip Strengthening (Resistive Putty)  Red Putty  Roll putty on the table with your left hand into a fat hot dog.  Then squeeze using all your fingers and your thumb. SQUEEZE LONG AND HARD. Repeat _20___ times. Do __2__ sessions per day.   2. Roll putty into tube on table with your left hand. Pinch hard along the tube with all your fingers. BEFORE EACH [place] THROW YOUR FINGERS WIDE OPEN. This helps engage the hand.    Demonstration helps when Christina [name] gets stuck as well as big cues (open your hand vs. Put your finger here)   Coordination Activities  Perform the following activities for 15 minutes 1-2  times per day with left hand(s).  Toss ball between hands. Toss ball in air and catch with the same hand. Flip cards 1 at a time as fast as you can. Pick up coins, buttons, marbles, dried beans/pasta of different sizes and place in container. Pick up coins and place in container or coin bank. Screw together nuts and bolts, then unfasten.   When tossing the ball in one hand, do 2 with your right hand then immediately 2 with your left hand.  This helps the brain "learn" from your right hand.  Also use your vision to compensate for the sensory issues as well as the motor planning - look at your left hand when you try to use it.    If you find the hand isn't doing what you want it it to do, stop and just place your hands flat on the table or on your lap and rest for 30 seconds.  This lets the brain "reset" - then try again.    Copyright  VHI. All rights reserved.

## 2016-07-07 NOTE — Therapy (Signed)
[facility] [address] [place], [place], [address] Phone: [phone]   Fax:  [phone]  Occupational Therapy Treatment  Patient Details  Name: Christina [name] MRN: [id number] Date of Birth: [birth year] Referring Provider: Sherley Bounds  Encounter Date: 07/07/2016      OT End of Session - 07/07/16 1039    Visit Number 2   Number of Visits 16   Date for OT Re-Evaluation 08/29/16   Authorization Type UMR no  visit limit   OT Start Time 0805   OT Stop Time 0846   OT Time Calculation (min) 41 min   Activity Tolerance Patient tolerated treatment well      Past Medical History:  Diagnosis Date  . Allergic rhinitis   . Fibroid   . Fibroids   . Grave's disease   . H/O Graves' disease   . H/O insomnia   . H/O menorrhagia 2012  . History of chicken pox   . Hypertension   . Hyperthyroidism    s/p rai 8/06 now hypothyroid Dr. Suzette Battiest  . Hypothyroid   . Infertility, female   . Leukemia ([place])   . Menorrhagia    Had Novasure  . MPN (myeloproliferative neoplasm) ([place]) 05/18/2015  . Polyp of cervix    Endometriel polyp. JO  . Vitamin D deficiency   . Vitamin D deficiency     Past Surgical History:  Procedure Laterality Date  . BUNIONECTOMY  1986  . CRANIOTOMY N/A 06/17/2016   Procedure: RIGHT CRANIOTOMY HEMATOMA EVACUATION SUBDURAL;  Surgeon: Eustace Moore, MD;  Location: [facility];  Service: Neurosurgery;  Laterality: N/A;  . CRANIOTOMY Right 06/18/2016   Procedure: CRANIOTOMY HEMATOMA EVACUATION SUBDURAL RIGHT;  Surgeon: Eustace Moore, MD;  Location: [facility];  Service: Neurosurgery;  Laterality: Right;  . DILATION AND CURETTAGE OF UTERUS  09/15/2010  . HYSTEROSCOPY W/D&C  09/28/2010  . MOUTH SURGERY    . NOVASURE ABLATION  09/28/2010  . REFRACTIVE SURGERY     for vision  . TONSILLECTOMY  1965    There were no vitals filed for this visit.      Subjective Assessment - 07/07/16 0805    Subjective  I don't have the  headache today   Patient is accompained by: Family member  husband   Patient Stated Goals To use my hand better   Currently in Pain? No/denies  yesterday I had a headache but not today                      OT Treatments/Exercises (OP) - 07/07/16 0001      ADLs   ADL Comments Reviewed all goals with patinent and husband - pt in agreement with goals.  Pt given written copy of goals.      Exercises   Exercises Hand     Hand Exercises   Other Hand Exercises Pt issued HEP for grip strength, beginning coordination, and functional use of R hand. Pt is extremely apraxic and requires block practice - pt's performance improves with demonstration, big picture cues, cues to use vision, and doing activities with R hand first followed immediately by left hand. Husband present and educated regarding program as well.  At this time pt needs vc's and imtermitent demonstration - husband able to provide. Pt improves with repetition.                 OT Education - 07/07/16 1032    Education provided Yes  Education Details HEPf for grip strength, beginning coordination.  education on apraxia   Person(s) Educated Patient;Spouse   Methods Explanation;Demonstration;Tactile cues;Verbal cues;Handout   Comprehension Verbalized understanding;Returned demonstration  husband able to cue and guide patient          OT Short Term Goals - 07/07/16 1038      OT SHORT TERM GOAL #1   Title Pt and husband will be mod I with HEP - 08/01/2016   Status On-going     OT SHORT TERM GOAL #2   Title Pt will be min a with dressing UB   Status On-going     OT SHORT TERM GOAL #3   Title Pt will be mod a with dressing LB   Status On-going     OT SHORT TERM GOAL #4   Title Pt will be mod I  for bathing   Status On-going     OT SHORT TERM GOAL #5   Title Pt will demonstate improved grip strength by at least 5 pounds (baseline= 25) to assist with functional tasks   Status On-going     OT  SHORT TERM GOAL #6   Title Pt will be mod I with organizing meds   Status On-going     OT SHORT TERM GOAL #7   Title Pt will be mod I with tub transfers   Status On-going     OT SHORT TERM GOAL #8   Title Pt will improve on Box and blocks by 5 blocks (baseline=14) with LUE as evidence of improved UE function   Status On-going     OT SHORT TERM GOAL  #9   TITLE Pt will attend to L field with min vc's.   Status On-going           OT Long Term Goals - 07/07/16 1038      OT LONG TERM GOAL #1   Title Pt will be mod I with HEP - 08/29/2016   Status On-going     OT LONG TERM GOAL #2   Title Pt will be mod I with dressing   Status On-going     OT LONG TERM GOAL #3   Title Pt will improve grip strength RUE by 8 pounds to assist with functional tasks (baseline= 25)   Status On-going     OT LONG TERM GOAL #4   Title Pt will improve on Box and Blocks by at least 8 blocks as evidence of improved UE function   Status On-going     OT LONG TERM GOAL #5   Title Pt will be mod I with simple familiar meal prep   Status On-going     OT LONG TERM GOAL #6   Title Pt will demonstrate ability to lift 3 pound object overhead 5 times without dropping with LUE   Status On-going     OT LONG TERM GOAL #7   Title Pt will demonstate ability to carry plate of food from counter to table with LUE   Status On-going     OT LONG TERM GOAL #8   Title Pt will be able to complete 9 hole peg test in 2 minutes or less as demonstration of improved coordination   Status On-going     OT LONG TERM GOAL  #9   Baseline Pt will be mod I with meds (including opening containers)   Status On-going     OT LONG TERM GOAL  #10   TITLE Pt will be mod I with  simple laundry, clean up tasks in home.   Status On-going               Plan - 07/07/16 1038    Clinical Impression Statement Pt in agreement with goals. Pt able to complete initial HEP with gudiance from husband.    Rehab Potential Good   OT  Frequency 2x / week   OT Duration 8 weeks   OT Treatment/Interventions Self-care/ADL training;Moist Heat;Electrical Stimulation;Fluidtherapy;DME and/or AE instruction;Energy conservation;Neuromuscular education;Therapeutic exercise;Functional Mobility Training;Manual Therapy;Passive range of motion;Splinting;Therapeutic activities;Patient/family education;Visual/perceptual remediation/compensation;Cognitive remediation/compensation   Plan MOCA, check HEP, add proximal strength HEP if possible   Consulted and Agree with Plan of Care Patient;Family member/caregiver   Family Member Consulted husband Annie Main      Patient will benefit from skilled therapeutic intervention in order to improve the following deficits and impairments:  Decreased activity tolerance, Decreased endurance, Decreased coordination, Decreased cognition, Decreased knowledge of use of DME, Decreased range of motion, Decreased strength, Impaired UE functional use, Impaired tone, Impaired sensation, Impaired vision/preception  Visit Diagnosis: 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 S/P craniotomy 06/17/2016  . Pancytopenia due to antineoplastic chemotherapy ([place]) 06/30/2015  . Elevated diaphragm 06/30/2015  . DOE (dyspnea on exertion) 06/30/2015  . Diarrhea due to drug 06/30/2015  . CML (chronic myeloid leukemia) ([place]) 05/18/2015  . Encounter for chemotherapy management 05/18/2015  . 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 Hyperuricemia 05/15/2015  . Essential hypertension 11/27/2014  . Shortness of breath 12/03/2013  . Hx of Graves' disease 12/03/2013  . Elevated blood pressure  reading 12/03/2013  . Special screening for malignant neoplasms, colon 08/09/2013  . Insomnia 04/29/2012  . Medication management 04/29/2012  . Foot pain 04/29/2012  . Polyp of cervix   . Fibroids   . Vitamin D deficiency   . Cubital tunnel syndrome 09/05/2011  . TENDINITIS 12/19/2008  . HYPERTHYROIDISM 03/06/2007  . ARTHRALGIA 03/05/2007  . INSOMNIA 03/05/2007  . [place] DISEASE 03/02/2007  . LOSS, HEARING NOS 03/02/2007  . Allergic rhinitis 03/02/2007    Quay Burow, OTR/L 07/07/2016, 10:41 AM  [facility] [address] [place], [place], [address] Phone: [phone]   Fax:  [phone]  Name: EDITH LARGO MRN: [id number] Date of Birth: [birth year]

## 2016-07-08 ENCOUNTER — Encounter: Payer: Self-pay | Admitting: Physical Therapy

## 2016-07-08 ENCOUNTER — Ambulatory Visit: Payer: Managed Care, Other (non HMO) | Attending: Neurological Surgery | Admitting: Physical Therapy

## 2016-07-08 ENCOUNTER — Other Ambulatory Visit: Payer: Self-pay | Admitting: Neurological Surgery

## 2016-07-08 DIAGNOSIS — R482 Apraxia: Secondary | ICD-10-CM | POA: Diagnosis present

## 2016-07-08 DIAGNOSIS — M6281 Muscle weakness (generalized): Secondary | ICD-10-CM | POA: Insufficient documentation

## 2016-07-08 DIAGNOSIS — R41844 Frontal lobe and executive function deficit: Secondary | ICD-10-CM | POA: Insufficient documentation

## 2016-07-08 DIAGNOSIS — R2681 Unsteadiness on feet: Secondary | ICD-10-CM | POA: Diagnosis present

## 2016-07-08 DIAGNOSIS — R29818 Other symptoms and signs involving the nervous system: Secondary | ICD-10-CM | POA: Insufficient documentation

## 2016-07-08 DIAGNOSIS — S065X9A Traumatic subdural hemorrhage with loss of consciousness of unspecified duration, initial encounter: Secondary | ICD-10-CM

## 2016-07-08 DIAGNOSIS — R41842 Visuospatial deficit: Secondary | ICD-10-CM | POA: Diagnosis present

## 2016-07-08 DIAGNOSIS — R278 Other lack of coordination: Secondary | ICD-10-CM | POA: Diagnosis present

## 2016-07-08 DIAGNOSIS — I69254 Hemiplegia and hemiparesis following other nontraumatic intracranial hemorrhage affecting left non-dominant side: Secondary | ICD-10-CM | POA: Insufficient documentation

## 2016-07-08 DIAGNOSIS — S065XAA Traumatic subdural hemorrhage with loss of consciousness status unknown, initial encounter: Secondary | ICD-10-CM

## 2016-07-08 DIAGNOSIS — R2689 Other abnormalities of gait and mobility: Secondary | ICD-10-CM | POA: Insufficient documentation

## 2016-07-08 NOTE — Patient Instructions (Addendum)
Perform strengthening exercises every other day, and on opposite days do balance activities.  Begin balance activities with support from hand on counter and begin to progress with out support as you become comfortable with exercises.  Bridge    Lie back, legs bent. Inhale, pressing hips up. Keeping ribs in, lengthen lower back. Exhale, rolling down along spine from top. Repeat __10__ times. Do __3__ sets per session  http://pm.exer.us/55   Copyright  VHI. All rights reserved.  Strengthening: Straight Leg Raise (Phase 1)    Tighten muscles on front of right thigh, then lift leg till even with bent leg, keeping knee locked.  Repeat __10__ times per set. Do __3__ sets per session on each leg.   http://orth.exer.us/614   Copyright  VHI. All rights reserved.  Strengthening: Hip Abductor - Resisted    With band looped around both legs above knees, push thighs apart. Repeat __10__ times per set. Do __3__ sets per session.   http://orth.exer.us/688   Copyright  VHI. All rights reserved.  Backward    Walk backwards with eyes open. Take even steps, making sure each foot lifts off floor.  Repeat __3__ laps per session. Do __1__ sessions per day.   Copyright  VHI. All rights reserved.  Braiding    Move to side: 1) cross right leg in front of left, 2) bring back leg out to side, then 3) cross right leg behind left, 4) bring left leg out to side. Continue sequence in same direction. Reverse sequence, moving in opposite direction. Repeat sequence __3__ laps session. Do __1__ sessions per day.   Copyright  VHI. All rights reserved.  Feet Heel-Toe "Tandem"    Arms at your side, walk a straight line bringing one foot directly in front of the other. Forward and backwards. Repeat for __3__ laps per session. Do __1__ sessions per day.  Copyright  VHI. All rights reserved.  Side-Stepping    Walk to left side with eyes open. Take even steps, leading with same foot. Make  sure each foot lifts off the floor. Repeat in opposite direction. Repeat for _3_ laps per session. Do _1_ sessions per day.  Copyright  VHI. All rights reserved.  Walking on Heels    Walk on heels for _3_ laps while continuing on a straight path. Forward and backwards. Do _1_ sessions per day.  Copyright  VHI. All rights reserved.  Walking on Toes    Walk on toes for __3__ laps while continuing on a straight path. Forward and backwards. Do __1__ sessions per day.  Copyright  VHI. All rights reserved.  "I love a Database administrator    At [employer] march forward and backwards. Repeat __3__ laps. Do __1__ sessions per day.  http://gt2.exer.us/345   Copyright  VHI. All rights reserved.

## 2016-07-08 NOTE — Therapy (Signed)
[facility] [address] [place], [place], [address] Phone: [phone]   Fax:  [phone]  Physical Therapy Treatment  Patient Details  Name: Christina [name] MRN: [id number] Date of Birth: 1961/07/31 Referring Provider: Sherley Bounds  Encounter Date: 07/08/2016      PT End of Session - 07/08/16 1542    Visit Number 2   Number of Visits 9   Date for PT Re-Evaluation 08/12/16   Authorization Type Private Insurance   PT Start Time 0115   PT Stop Time 0155   PT Time Calculation (min) 40 min   Equipment Utilized During Treatment Gait belt   Activity Tolerance Patient limited by fatigue   Behavior During Therapy Flat affect      Past Medical History:  Diagnosis Date  . Allergic rhinitis   . Fibroid   . Fibroids   . Grave's disease   . H/O Graves' disease   . H/O insomnia   . H/O menorrhagia 2012  . History of chicken pox   . Hypertension   . Hyperthyroidism    s/p rai 8/06 now hypothyroid Dr. Suzette Battiest  . Hypothyroid   . Infertility, female   . Leukemia ([place])   . Menorrhagia    Had Novasure  . MPN (myeloproliferative neoplasm) ([place]) 05/18/2015  . Polyp of cervix    Endometriel polyp. JO  . Vitamin D deficiency   . Vitamin D deficiency     Past Surgical History:  Procedure Laterality Date  . BUNIONECTOMY  1986  . CRANIOTOMY N/A 06/17/2016   Procedure: RIGHT CRANIOTOMY HEMATOMA EVACUATION SUBDURAL;  Surgeon: Eustace Moore, MD;  Location: [facility];  Service: Neurosurgery;  Laterality: N/A;  . CRANIOTOMY Right 06/18/2016   Procedure: CRANIOTOMY HEMATOMA EVACUATION SUBDURAL RIGHT;  Surgeon: Eustace Moore, MD;  Location: [facility];  Service: Neurosurgery;  Laterality: Right;  . DILATION AND CURETTAGE OF UTERUS  09/15/2010  . HYSTEROSCOPY W/D&C  09/28/2010  . MOUTH SURGERY    . NOVASURE ABLATION  09/28/2010  . REFRACTIVE SURGERY     for vision  . TONSILLECTOMY  1965    There were no vitals filed for this visit.       Subjective Assessment - 07/08/16 1320    Subjective Patient reports she has not been up to much. Just home trying to recooperate.   Patient is accompained by: Family member   Pertinent History Spouse reports having to help pt get dressed, dried off.     Limitations Other (comment)   Currently in Pain? No/denies   Pain Score 0-No pain      HEP performed with demo and VC provided by student PTA. Please see patient instruction below for activities performed.       PT Education - 07/08/16 1538    Education provided Yes   Education Details Provided patient with HEP for LE strengthening and balance   Person(s) Educated Patient   Methods Explanation;Demonstration;Handout   Comprehension Verbalized understanding;Returned demonstration;Need further instruction           PT Long Term Goals - 07/04/16 1835      PT LONG TERM GOAL #1   Title Patient will demonstrate decreased fall risk with DGI 19 or greater.   Time 4   Period Weeks   Status New     PT LONG TERM GOAL #2   Title Patient will ambulate indoor and outdoor surfaces 1000' independent no LOB including curb negotiation.    Time 4  Period Weeks   Status New     PT LONG TERM GOAL #3   Title Patient will be independent with HEP for balance and LE strength.    Time 4   Period Weeks   Status New     PT LONG TERM GOAL #4   Title Patient will tolerate 25 minutes continuous standing or walking activities without seated rest.    Time 4   Period Weeks   Status New     PT LONG TERM GOAL #5   Title Patient will demonstrate decreased fall risk with TUG cognitive 13.5 sec or less.   Time 4   Period Weeks   Status New           Plan - 07/08/16 1543    Clinical Impression Statement Todays skilled session focused on providing patient with HEP for LE strengthening and balance. Patient able to perform balance activities with minimal difficulty and should benefit from performing balance exercises on a compliant surface.    Rehab Potential Good   PT Frequency 2x / week   PT Duration 4 weeks   PT Treatment/Interventions ADLs/Self Care Home Management;Patient/family education;Stair training;Functional mobility training;Therapeutic activities;Therapeutic exercise;Balance training;DME Instruction;Gait training;Vestibular   PT Next Visit Plan LE strengthening, high level balance on compliant surface   PT Home Exercise Plan Clamshell, SLR, Bridges, Balance at counter top   Consulted and Agree with Plan of Care Patient;Family member/caregiver   Family Member Consulted spouse      Patient will benefit from skilled therapeutic intervention in order to improve the following deficits and impairments:  Abnormal gait, Decreased activity tolerance, Decreased balance, Decreased coordination, Decreased mobility, Decreased endurance, Decreased strength, Impaired perceived functional ability, Decreased safety awareness, Decreased cognition  Visit Diagnosis: Muscle weakness (generalized)  Unsteadiness on feet  Other abnormalities of gait and mobility     Problem List Patient Active Problem List   Diagnosis Date Noted  . S/P craniotomy 06/17/2016  . Pancytopenia due to antineoplastic chemotherapy ([place]) 06/30/2015  . Elevated diaphragm 06/30/2015  . DOE (dyspnea on exertion) 06/30/2015  . Diarrhea due to drug 06/30/2015  . CML (chronic myeloid leukemia) ([place]) 05/18/2015  . Encounter for chemotherapy management 05/18/2015  . 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 Hyperuricemia 05/15/2015  . Essential hypertension 11/27/2014  . Shortness of breath 12/03/2013  . Hx of Graves' disease 12/03/2013  . Elevated blood pressure reading 12/03/2013  . Special screening for malignant neoplasms, colon 08/09/2013  . Insomnia 04/29/2012  . Medication management 04/29/2012  . Foot pain 04/29/2012  . Polyp of cervix   . Fibroids   . Vitamin D deficiency   . Cubital tunnel syndrome 09/05/2011  . TENDINITIS 12/19/2008  .  HYPERTHYROIDISM 03/06/2007  . ARTHRALGIA 03/05/2007  . INSOMNIA 03/05/2007  . [place] DISEASE 03/02/2007  . LOSS, HEARING NOS 03/02/2007  . Allergic rhinitis 03/02/2007    Benjiman Core, SPTA 07/08/2016, 3:49 PM  [facility] [address] [place], [place], [address] Phone: [phone]   Fax:  [phone]  Name: Christina [name] MRN: [id number] Date of Birth: 1960-12-08

## 2016-07-11 ENCOUNTER — Ambulatory Visit
Admission: RE | Admit: 2016-07-11 | Discharge: 2016-07-11 | Disposition: A | Discharge status: Home / Self Care | Payer: Managed Care, Other (non HMO) | Source: Ambulatory Visit | Attending: Neurological Surgery | Admitting: Neurological Surgery

## 2016-07-11 DIAGNOSIS — S065X9A Traumatic subdural hemorrhage with loss of consciousness of unspecified duration, initial encounter: Secondary | ICD-10-CM

## 2016-07-11 DIAGNOSIS — S065XAA Traumatic subdural hemorrhage with loss of consciousness status unknown, initial encounter: Secondary | ICD-10-CM

## 2016-07-11 IMAGING — CT CT HEAD W/O CM
3 series · 15 of 47 positions shown, 18 images · non-contrast
Comparison: [DATE] and earlier.

CLINICAL DATA: 55-year-old female status post right subdural
drainage in [REDACTED]. Subsequent encounter.

EXAM:
CT HEAD WITHOUT CONTRAST
TECHNIQUE: Contiguous axial images were obtained from the base of the skull
through the vertex without intravenous contrast.

[Series 2: head w/(date) · axial · 0.47mm/px · z∈[-60,+75]mm · 9 of 33 slices shown, 12 images]
[im 3/33  brain]
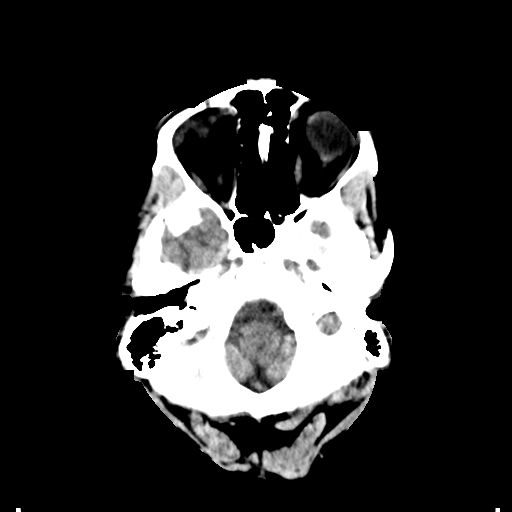
[im 3/33  bone]
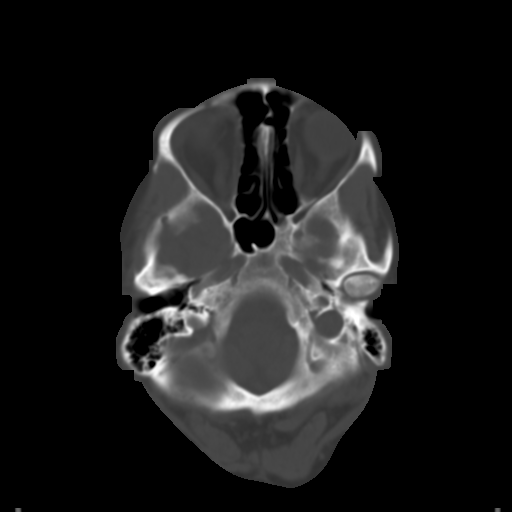
[im 6/33  brain]
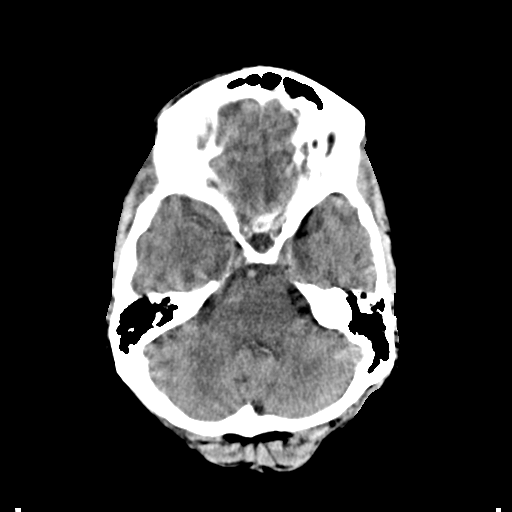
[im 9/33  brain]
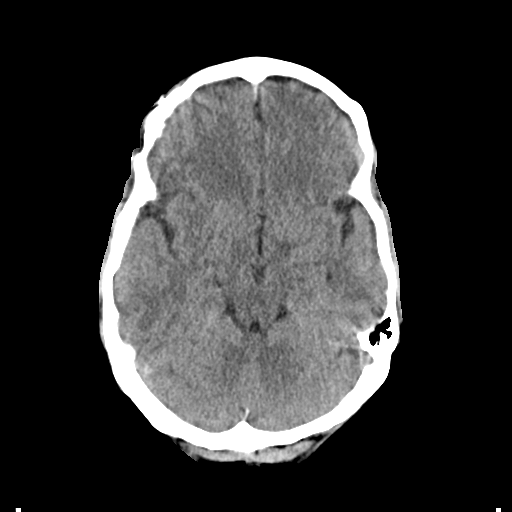
[im 13/33  brain]
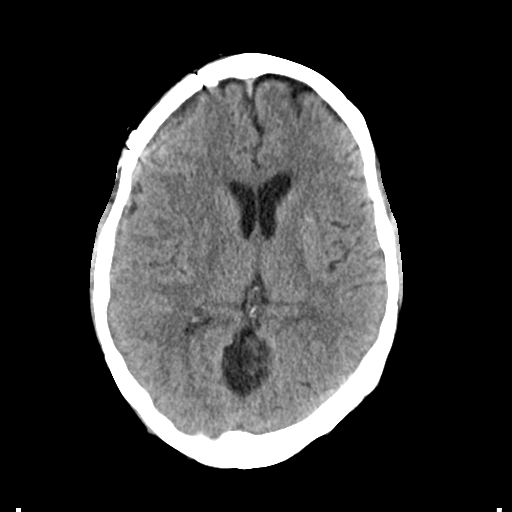
[im 17/33  brain]
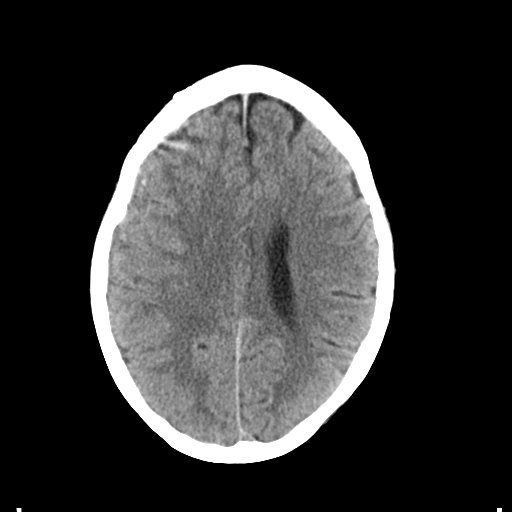
[im 17/33  bone]
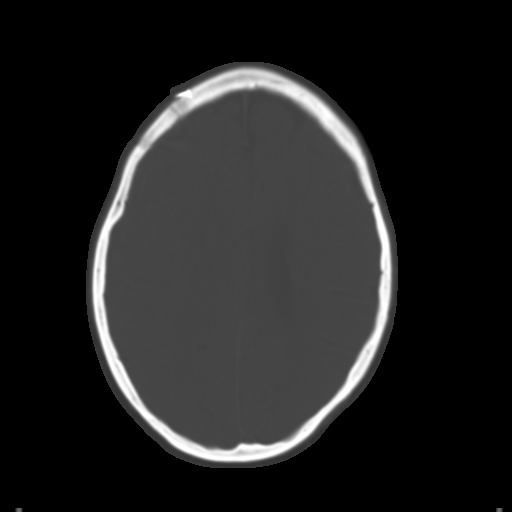
[im 20/33  brain]
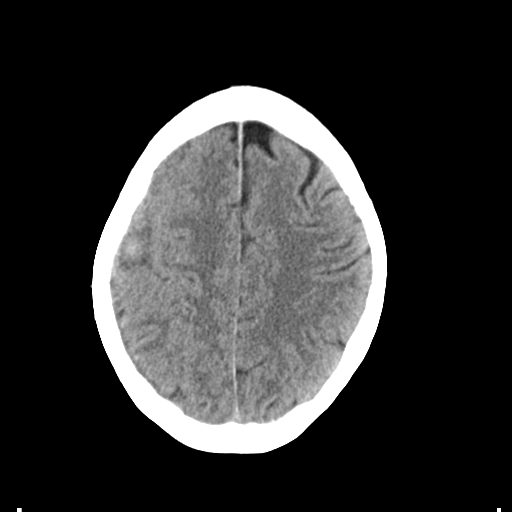
[im 24/33  brain]
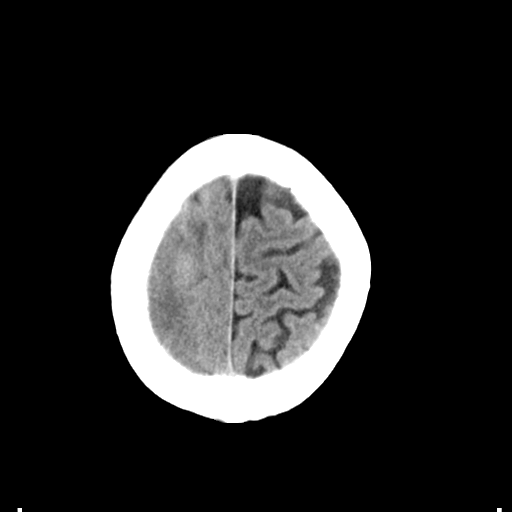
[im 27/33  brain]
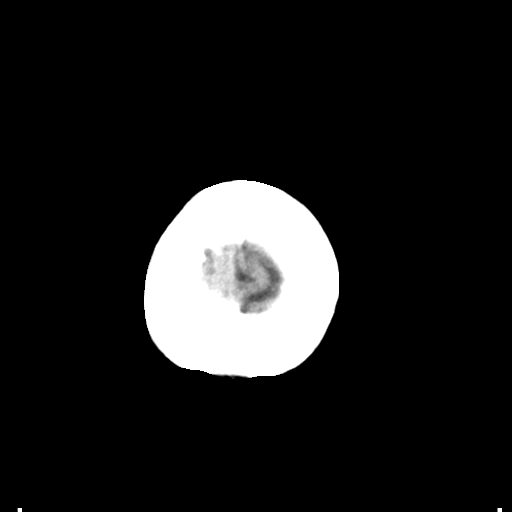
[im 30/33  brain]
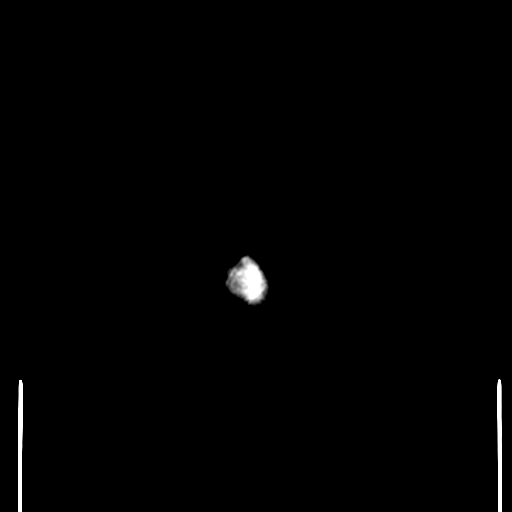
[im 30/33  bone]
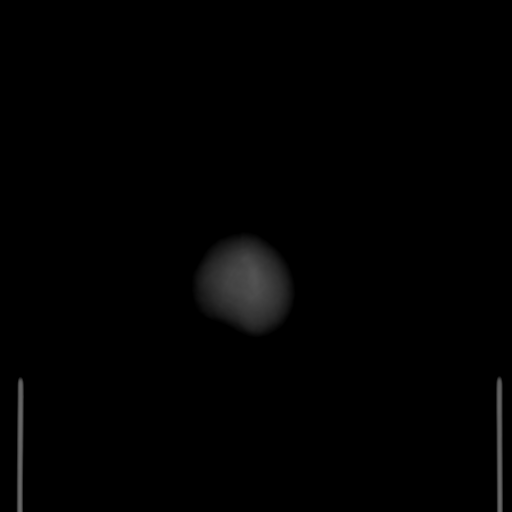

[Series 4: cor · coronal · 0.30mm/px · 3 of 65 slices shown]
[im 22/65  brain]
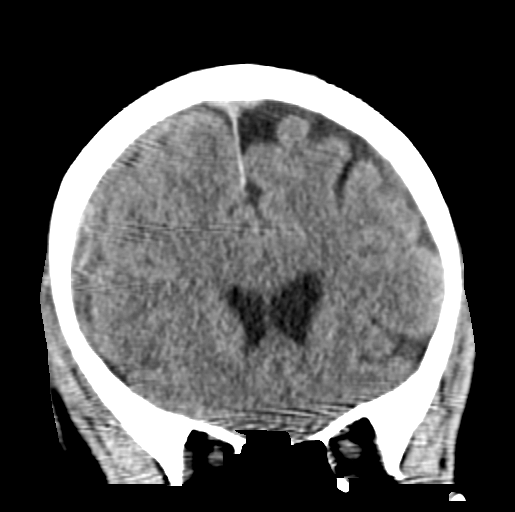
[im 29/65  brain]
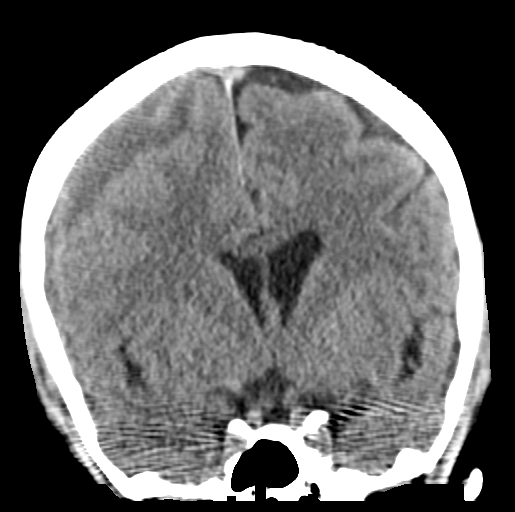
[im 36/65  brain]
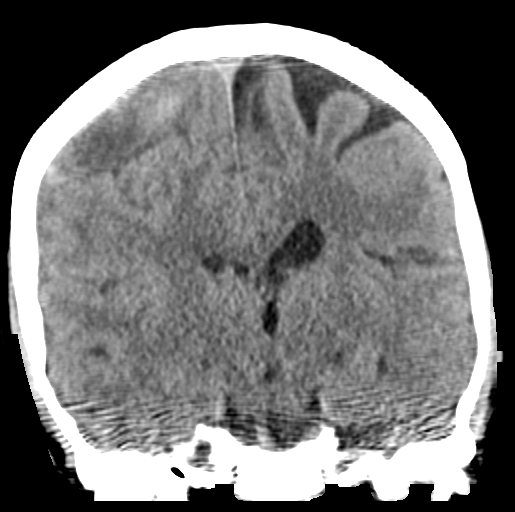

[Series 5: sag · sagittal · 0.30mm/px · 3 of 51 slices shown]
[im 17/51  brain]
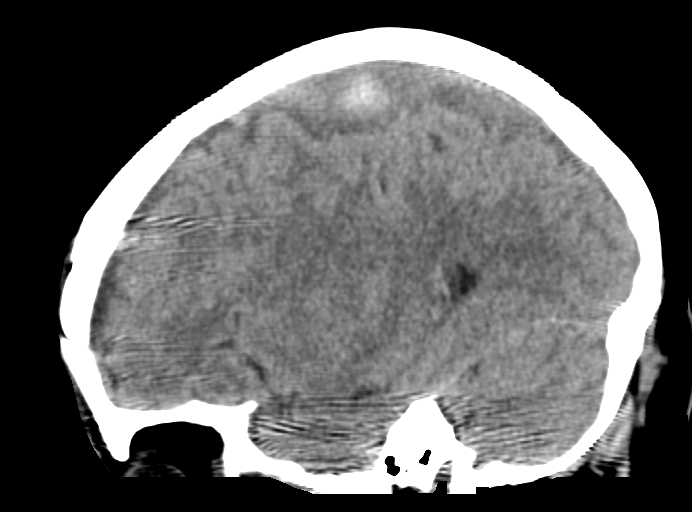
[im 26/51  brain]
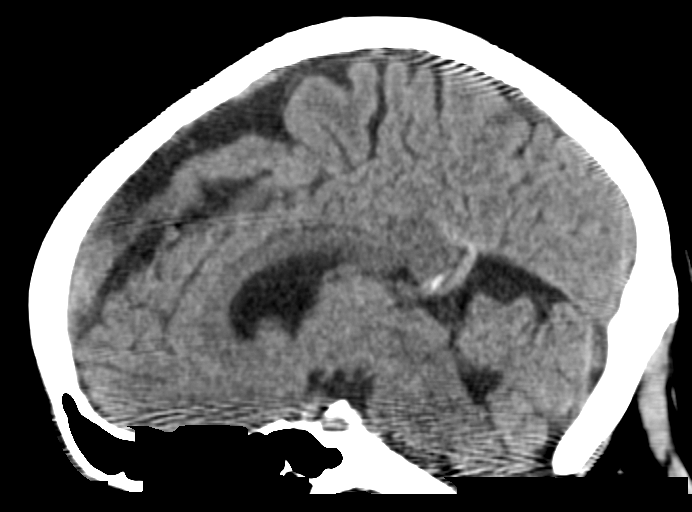
[im 34/51  brain]
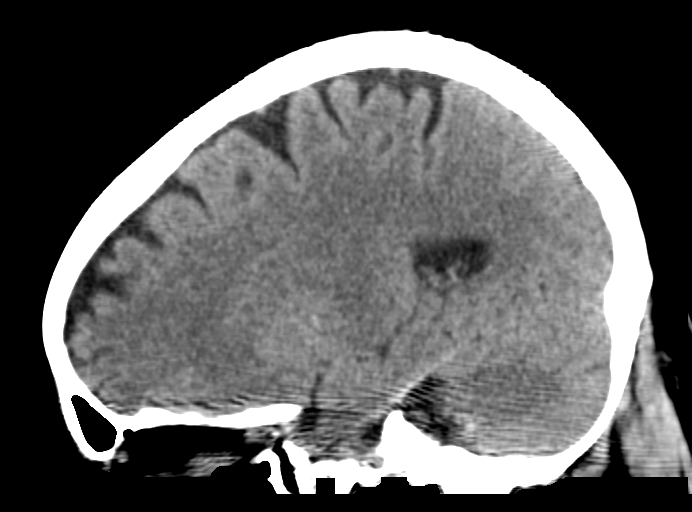

[15 of 47 positions shown; findings below may reference images not displayed]

FINDINGS: Brain: Residual mixed density right extra-axial collection, stable
to mildly since [REDACTED]. Residual mixed density blood products
measure up to 17 mm in thickness (coronal image 32) but at most
levels are 6-7 mm in thickness along the right hemisphere. Mildly
improved patency of the right lateral ventricle. Stable the slightly
decreased leftward midline shift, 3-4 mm. No increased intracranial
mass effect. Basilar cisterns remain patent. Stable gray-white
matter differentiation throughout the brain. No cortically based
acute infarct identified. No ventriculomegaly. No new intracranial
hemorrhage identified.

Vascular: No suspicious intracranial vascular hyperdensity.

Skull: Sequelae of right frontal craniotomy again noted. No acute
osseous abnormality identified.

Sinuses/Orbits: Visualized paranasal sinuses and mastoids are stable
and well pneumatized.

Other: Skin staples have been removed. No acute scalp or orbits soft
tissue finding.
IMPRESSION: 1. Stable to slightly reduced volume of residual mixed density
mildly lobulated right subdural hematoma since [DATE]. Residual
mass effect on the right hemisphere with 3-4 mm of leftward midline
shift.
2. No new intracranial abnormality.

## 2016-07-12 ENCOUNTER — Telehealth: Payer: Self-pay | Admitting: *Deleted

## 2016-07-12 NOTE — Telephone Encounter (Signed)
"  I need to be brought in by my husband Thursday because I can't drive.  Can I come in at 8:30 so he can get to work after my appointment?"  Scheduler notified and changed appointment.

## 2016-07-13 ENCOUNTER — Ambulatory Visit: Payer: Managed Care, Other (non HMO) | Admitting: *Deleted

## 2016-07-13 DIAGNOSIS — R2689 Other abnormalities of gait and mobility: Secondary | ICD-10-CM

## 2016-07-13 DIAGNOSIS — R2681 Unsteadiness on feet: Secondary | ICD-10-CM

## 2016-07-13 DIAGNOSIS — M6281 Muscle weakness (generalized): Secondary | ICD-10-CM

## 2016-07-13 MED ORDER — SYNTHROID 150 MCG PO TABS: 150.0000 ug | ORAL_TABLET | Freq: Every day | ORAL | 0 refills | Status: DC

## 2016-07-13 MED FILL — SYNTHROID 150 MCG TABLET: 150 | 90 days supply | Qty: 90 | Fill #0

## 2016-07-13 NOTE — Telephone Encounter (Signed)
Please refill the Synthroid at [facility] (she had a thyroid panel checked in February)

## 2016-07-13 NOTE — Telephone Encounter (Signed)
Sent to the pharmacy by e-scribe for 90 days. 

## 2016-07-13 NOTE — Telephone Encounter (Signed)
Misty please send in  90 days of synthroid    To pharmacy

## 2016-07-13 NOTE — Therapy (Signed)
[facility] [address] [place], [place], [address] Phone: [phone]   Fax:  [phone]  Physical Therapy Treatment  Patient Details  Name: Christina [name] MRN: [id number] Date of Birth: 10/18/1960 Referring Provider: Sherley Bounds  Encounter Date: 07/13/2016      PT End of Session - 07/13/16 1216    Visit Number 3   Number of Visits 9   Date for PT Re-Evaluation 08/12/16   Authorization Type Private Insurance   PT Start Time 0803   PT Stop Time 0845   PT Time Calculation (min) 42 min   Equipment Utilized During Treatment Gait belt   Activity Tolerance Patient tolerated treatment well   Behavior During Therapy Flat affect      Past Medical History:  Diagnosis Date  . Allergic rhinitis   . Fibroid   . Fibroids   . Grave's disease   . H/O Graves' disease   . H/O insomnia   . H/O menorrhagia 2012  . History of chicken pox   . Hypertension   . Hyperthyroidism    s/p rai 8/06 now hypothyroid Dr. Suzette Battiest  . Hypothyroid   . Infertility, female   . Leukemia ([place])   . Menorrhagia    Had Novasure  . 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 Polyp of cervix    Endometriel polyp. JO  . Vitamin D deficiency   . Vitamin D deficiency     Past Surgical History:  Procedure Laterality Date  . BUNIONECTOMY  1986  . CRANIOTOMY N/A 06/17/2016   Procedure: RIGHT CRANIOTOMY HEMATOMA EVACUATION SUBDURAL;  Surgeon: Eustace Moore, MD;  Location: [facility];  Service: Neurosurgery;  Laterality: N/A;  . CRANIOTOMY Right 06/18/2016   Procedure: CRANIOTOMY HEMATOMA EVACUATION SUBDURAL RIGHT;  Surgeon: Eustace Moore, MD;  Location: [facility];  Service: Neurosurgery;  Laterality: Right;  . DILATION AND CURETTAGE OF UTERUS  09/15/2010  . HYSTEROSCOPY W/D&C  09/28/2010  . MOUTH SURGERY    . NOVASURE ABLATION  09/28/2010  . REFRACTIVE SURGERY     for vision  . TONSILLECTOMY  1965    There were no vitals filed for this  visit.      Subjective Assessment - 07/13/16 0808    Subjective Reports tried exercises and did not cause pain or difficulty.   Patient is accompained by: Family member   Pain Score 3    Pain Location Head   Pain Orientation Right   Pain Descriptors / Indicators Aching   Pain Type Chronic pain   Aggravating Factors  unknown   Pain Relieving Factors tylenol                         [facility] Adult PT Treatment/Exercise - 07/13/16 0001      Ambulation/Gait   Ambulation/Gait Yes   Ambulation/Gait Assistance 4: Min guard;5: Supervision   Ambulation/Gait Assistance Details assist for safety with ball toss to self, head turns to toss and catch ball   Ambulation Distance (Feet) 345 Feet     High Level Balance   High Level Balance Activities Side stepping;Braiding;Backward walking;Marching forwards;Marching backwards;Tandem walking;Other (comment)  all for HEP review   High Level Balance Comments heel and toe walking; standing on foam in parallel bars, eyes closed 30 sec; then head nods with ball toss to self, then turns to toss and catch ball over opposite shoulders     Exercises   Exercises Knee/Hip  Knee/Hip Exercises: Machines for Strengthening   Cybex Leg Press 10 x 2 @ 60#     Knee/Hip Exercises: Supine   Bridges Strengthening;Both;10 reps   Straight Leg Raises Strengthening;Both;10 reps  cues for proper performance as written per HEP   Other Supine Knee/Hip Exercises hooklying clams with red t-band x 10                PT Education - 07/13/16 1215    Education provided Yes   Education Details review of HEP   Person(s) Educated Patient   Methods Explanation;Demonstration   Comprehension Verbalized understanding;Need further instruction             PT Long Term Goals - 07/04/16 1835      PT LONG TERM GOAL #1   Title Patient will demonstrate decreased fall risk with DGI 19 or greater.   Time 4   Period Weeks   Status New     PT LONG  TERM GOAL #2   Title Patient will ambulate indoor and outdoor surfaces 1000' independent no LOB including curb negotiation.    Time 4   Period Weeks   Status New     PT LONG TERM GOAL #3   Title Patient will be independent with HEP for balance and LE strength.    Time 4   Period Weeks   Status New     PT LONG TERM GOAL #4   Title Patient will tolerate 25 minutes continuous standing or walking activities without seated rest.    Time 4   Period Weeks   Status New     PT LONG TERM GOAL #5   Title Patient will demonstrate decreased fall risk with TUG cognitive 13.5 sec or less.   Time 4   Period Weeks   Status New               Plan - 07/13/16 1216    Clinical Impression Statement Patient initiating balance on compliant surface and dynamic gait activities today and tolerating entire session without seated rest.  HEP performed with min cues for technique with supine LE therex and standing balance activities at counter.  Will continue to benefit from skilled PT to progress to goals.    Rehab Potential Good   PT Frequency 2x / week   PT Duration 4 weeks   PT Treatment/Interventions ADLs/Self Care Home Management;Patient/family education;Stair training;Functional mobility training;Therapeutic activities;Therapeutic exercise;Balance training;DME Instruction;Gait training;Vestibular   PT Next Visit Plan dynamic gait activities, gait speed work on treadmill   PT Home Exercise Plan Clamshell, SLR, Bridges, Balance at counter top   Consulted and Agree with Plan of Care Patient;Family member/caregiver   Family Member Consulted spouse      Patient will benefit from skilled therapeutic intervention in order to improve the following deficits and impairments:  Abnormal gait, Decreased activity tolerance, Decreased balance, Decreased coordination, Decreased mobility, Decreased endurance, Decreased strength, Impaired perceived functional ability, Decreased safety awareness, Decreased  cognition  Visit Diagnosis: Muscle weakness (generalized)  Unsteadiness on feet  Other abnormalities of gait and mobility     Problem List Patient Active Problem List   Diagnosis Date Noted  . S/P craniotomy 06/17/2016  . Pancytopenia due to antineoplastic chemotherapy ([facility]) 06/30/2015  . Elevated diaphragm 06/30/2015  . DOE (dyspnea on exertion) 06/30/2015  . Diarrhea due to drug 06/30/2015  . CML (chronic myeloid leukemia) ([place]) 05/18/2015  . Encounter for chemotherapy management 05/18/2015  . 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 Hyperuricemia 05/15/2015  .  Essential hypertension 11/27/2014  . Shortness of breath 12/03/2013  . Hx of Graves' disease 12/03/2013  . Elevated blood pressure reading 12/03/2013  . Special screening for malignant neoplasms, colon 08/09/2013  . Insomnia 04/29/2012  . Medication management 04/29/2012  . Foot pain 04/29/2012  . Polyp of cervix   . Fibroids   . Vitamin D deficiency   . Cubital tunnel syndrome 09/05/2011  . TENDINITIS 12/19/2008  . HYPERTHYROIDISM 03/06/2007  . ARTHRALGIA 03/05/2007  . INSOMNIA 03/05/2007  . [place] DISEASE 03/02/2007  . LOSS, HEARING NOS 03/02/2007  . Allergic rhinitis 03/02/2007    Reginia Naas 07/13/2016, 12:19 PM  Magda Kiel, [place] 07/13/2016   [facility] [address] [place], [place], [address] Phone: [phone]   Fax:  [phone]  Name: RHIANNE FRADETTE MRN: [id number] Date of Birth: July 27, 1961

## 2016-07-14 ENCOUNTER — Other Ambulatory Visit (HOSPITAL_BASED_OUTPATIENT_CLINIC_OR_DEPARTMENT_OTHER): Payer: Managed Care, Other (non HMO)

## 2016-07-14 ENCOUNTER — Ambulatory Visit: Payer: Managed Care, Other (non HMO) | Admitting: Occupational Therapy

## 2016-07-14 DIAGNOSIS — I69254 Hemiplegia and hemiparesis following other nontraumatic intracranial hemorrhage affecting left non-dominant side: Secondary | ICD-10-CM

## 2016-07-14 DIAGNOSIS — R41842 Visuospatial deficit: Secondary | ICD-10-CM

## 2016-07-14 DIAGNOSIS — R278 Other lack of coordination: Secondary | ICD-10-CM

## 2016-07-14 DIAGNOSIS — M6281 Muscle weakness (generalized): Secondary | ICD-10-CM | POA: Diagnosis not present

## 2016-07-14 DIAGNOSIS — C9210 Chronic myeloid leukemia, BCR/ABL-positive, not having achieved remission: Secondary | ICD-10-CM | POA: Diagnosis not present

## 2016-07-14 LAB — CBC WITH DIFFERENTIAL/PLATELET
BASO%: 1 % (ref 0.0–2.0)
BASOS ABS: 0.1 10*3/uL (ref 0.0–0.1)
EOS%: 4.5 % (ref 0.0–7.0)
Eosinophils Absolute: 0.3 10*3/uL (ref 0.0–0.5)
HCT: 34.7 % — ABNORMAL LOW (ref 34.8–46.6)
HEMOGLOBIN: 11.6 g/dL (ref 11.6–15.9)
LYMPH%: 29.6 % (ref 14.0–49.7)
MCH: 30.2 pg (ref 25.1–34.0)
MCHC: 33.4 g/dL (ref 31.5–36.0)
MCV: 90.3 fL (ref 79.5–101.0)
MONO#: 0.3 10*3/uL (ref 0.1–0.9)
MONO%: 5.8 % (ref 0.0–14.0)
NEUT#: 3.5 10*3/uL (ref 1.5–6.5)
NEUT%: 59.1 % (ref 38.4–76.8)
Platelets: 355 10*3/uL (ref 145–400)
RBC: 3.85 10*6/uL (ref 3.70–5.45)
RDW: 14.8 % — AB (ref 11.2–14.5)
WBC: 6 10*3/uL (ref 3.9–10.3)
lymph#: 1.8 10*3/uL (ref 0.9–3.3)

## 2016-07-14 LAB — COMPREHENSIVE METABOLIC PANEL
ALBUMIN: 3.4 g/dL — AB (ref 3.5–5.0)
ALK PHOS: 64 U/L (ref 40–150)
ALT: 21 U/L (ref 0–55)
ANION GAP: 11 meq/L (ref 3–11)
AST: 17 U/L (ref 5–34)
BILIRUBIN TOTAL: 0.25 mg/dL (ref 0.20–1.20)
BUN: 14.3 mg/dL (ref 7.0–26.0)
CALCIUM: 9.4 mg/dL (ref 8.4–10.4)
CO2: 24 meq/L (ref 22–29)
CREATININE: 0.9 mg/dL (ref 0.6–1.1)
Chloride: 105 mEq/L (ref 98–109)
EGFR: 77 mL/min/{1.73_m2} — ABNORMAL LOW (ref >90)
Glucose: 90 mg/dl (ref 70–140)
Potassium: 4.3 mEq/L (ref 3.5–5.1)
Sodium: 139 mEq/L (ref 136–145)
TOTAL PROTEIN: 7.3 g/dL (ref 6.4–8.3)

## 2016-07-14 NOTE — Therapy (Signed)
[facility] [address] [place], [place], [address] Phone: [phone]   Fax:  [phone]  Occupational Therapy Treatment  Patient Details  Name: Christina [name] MRN: [id number] Date of Birth: September 12, 1960 Referring Provider: Sherley Bounds  Encounter Date: 07/14/2016      OT End of Session - 07/14/16 1637    Visit Number 3   Number of Visits 16   Date for OT Re-Evaluation 08/29/16   Authorization Type UMR no  visit limit   OT Start Time 1322   OT Stop Time 1405   OT Time Calculation (min) 43 min   Activity Tolerance Patient tolerated treatment well      Past Medical History:  Diagnosis Date  . Allergic rhinitis   . Fibroid   . Fibroids   . Grave's disease   . H/O Graves' disease   . H/O insomnia   . H/O menorrhagia 2012  . History of chicken pox   . Hypertension   . Hyperthyroidism    s/p rai 8/06 now hypothyroid Dr. Suzette Battiest  . Hypothyroid   . Infertility, female   . Leukemia ([facility])   . Menorrhagia    Had Novasure  . MPN (myeloproliferative neoplasm) ([facility]) 05/18/2015  . Polyp of cervix    Endometriel polyp. JO  . Vitamin D deficiency   . Vitamin D deficiency     Past Surgical History:  Procedure Laterality Date  . BUNIONECTOMY  1986  . CRANIOTOMY N/A 06/17/2016   Procedure: RIGHT CRANIOTOMY HEMATOMA EVACUATION SUBDURAL;  Surgeon: Eustace Moore, MD;  Location: [facility];  Service: Neurosurgery;  Laterality: N/A;  . CRANIOTOMY Right 06/18/2016   Procedure: CRANIOTOMY HEMATOMA EVACUATION SUBDURAL RIGHT;  Surgeon: Eustace Moore, MD;  Location: [facility];  Service: Neurosurgery;  Laterality: Right;  . DILATION AND CURETTAGE OF UTERUS  09/15/2010  . HYSTEROSCOPY W/D&C  09/28/2010  . MOUTH SURGERY    . NOVASURE ABLATION  09/28/2010  . REFRACTIVE SURGERY     for vision  . TONSILLECTOMY  1965    There were no vitals filed for this visit.      Subjective Assessment - 07/14/16 1330    Subjective  I don't feel I need  speech at this point. Husband denies word finding difficulties and slight dysarthria premorbid d/t hearing loss   Patient is accompained by: Family member  husband Richardson Landry)    Patient Stated Goals To use my hand better   Currently in Pain? No/denies                      OT Treatments/Exercises (OP) - 07/14/16 0001      ADLs   ADL Comments Pt encouraged to practice doing easy (24-48 pc) jigsaw puzzles at home to increase visual scanning Rt/Lt and to increase visual perceptual skills     Cognitive Exercises   Other Cognitive Exercises 1 Assessed MOCA with score 28/30. Pt had difficulty naming things that began with the letter "F". Pt initially also made errors in serial subtraction by 7's but id mistake and self corrected I'ly therefore gave credit for this. Pt had mild self corrected difficulty numbering clock     Fine Motor Coordination   Other Fine Motor Exercises Reviewed coordination HEP - Pt able to return all with significant improvement in apraxia. Pt still had difficulty tossing ball between hands, but improved since last visit. Pt/husband pleased at the improvements made with these tasks.  OT Short Term Goals - 07/07/16 1038      OT SHORT TERM GOAL #1   Title Pt and husband will be mod I with HEP - 08/01/2016   Status On-going     OT SHORT TERM GOAL #2   Title Pt will be min a with dressing UB   Status On-going     OT SHORT TERM GOAL #3   Title Pt will be mod a with dressing LB   Status On-going     OT SHORT TERM GOAL #4   Title Pt will be mod I  for bathing   Status On-going     OT SHORT TERM GOAL #5   Title Pt will demonstate improved grip strength by at least 5 pounds (baseline= 25) to assist with functional tasks   Status On-going     OT SHORT TERM GOAL #6   Title Pt will be mod I with organizing meds   Status On-going     OT SHORT TERM GOAL #7   Title Pt will be mod I with tub transfers   Status On-going     OT  SHORT TERM GOAL #8   Title Pt will improve on Box and blocks by 5 blocks (baseline=14) with LUE as evidence of improved UE function   Status On-going     OT SHORT TERM GOAL  #9   TITLE Pt will attend to L field with min vc's.   Status On-going           OT Long Term Goals - 07/07/16 1038      OT LONG TERM GOAL #1   Title Pt will be mod I with HEP - 08/29/2016   Status On-going     OT LONG TERM GOAL #2   Title Pt will be mod I with dressing   Status On-going     OT LONG TERM GOAL #3   Title Pt will improve grip strength RUE by 8 pounds to assist with functional tasks (baseline= 25)   Status On-going     OT LONG TERM GOAL #4   Title Pt will improve on Box and Blocks by at least 8 blocks as evidence of improved UE function   Status On-going     OT LONG TERM GOAL #5   Title Pt will be mod I with simple familiar meal prep   Status On-going     OT LONG TERM GOAL #6   Title Pt will demonstrate ability to lift 3 pound object overhead 5 times without dropping with LUE   Status On-going     OT LONG TERM GOAL #7   Title Pt will demonstate ability to carry plate of food from counter to table with LUE   Status On-going     OT LONG TERM GOAL #8   Title Pt will be able to complete 9 hole peg test in 2 minutes or less as demonstration of improved coordination   Status On-going     OT LONG TERM GOAL  #9   Baseline Pt will be mod I with meds (including opening containers)   Status On-going     OT LONG TERM GOAL  #10   TITLE Pt will be mod I with simple laundry, clean up tasks in home.   Status On-going               Plan - 07/14/16 1637    Clinical Impression Statement Pt with improvements in apraxia. Pt scored WFL's with MOCA  Rehab Potential Good   OT Frequency 2x / week   OT Duration 8 weeks   OT Treatment/Interventions Self-care/ADL training;Moist Heat;Electrical Stimulation;Fluidtherapy;DME and/or AE instruction;Energy conservation;Neuromuscular  education;Therapeutic exercise;Functional Mobility Training;Manual Therapy;Passive range of motion;Splinting;Therapeutic activities;Patient/family education;Visual/perceptual remediation/compensation;Cognitive remediation/compensation   Plan add proximal strength HEP prn as time did not allow today, ? working jigsaw puzzle, continue coordination and begin assessing STG's as pt appears to have already met some   Consulted and Agree with Plan of Care Patient;Family member/caregiver   Family Member Consulted husband Annie Main      Patient will benefit from skilled therapeutic intervention in order to improve the following deficits and impairments:  Decreased activity tolerance, Decreased endurance, Decreased coordination, Decreased cognition, Decreased knowledge of use of DME, Decreased range of motion, Decreased strength, Impaired UE functional use, Impaired tone, Impaired sensation, Impaired vision/preception  Visit Diagnosis: Other lack of coordination  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 S/P craniotomy 06/17/2016  . Pancytopenia due to antineoplastic chemotherapy ([facility]) 06/30/2015  . Elevated diaphragm 06/30/2015  . DOE (dyspnea on exertion) 06/30/2015  . Diarrhea due to drug 06/30/2015  . CML (chronic myeloid leukemia) ([facility]) 05/18/2015  . Encounter for chemotherapy management 05/18/2015  . 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 Hyperuricemia 05/15/2015  . Essential hypertension 11/27/2014  . Shortness of breath 12/03/2013  . Hx of Graves' disease 12/03/2013  . Elevated blood pressure reading 12/03/2013  . Special screening for malignant neoplasms, colon 08/09/2013  . Insomnia 04/29/2012  . Medication management 04/29/2012  . Foot pain 04/29/2012  . Polyp of cervix   . Fibroids   . Vitamin D deficiency   .  Cubital tunnel syndrome 09/05/2011  . TENDINITIS 12/19/2008  . HYPERTHYROIDISM 03/06/2007  . ARTHRALGIA 03/05/2007  . INSOMNIA 03/05/2007  . [place] DISEASE 03/02/2007  . LOSS, HEARING NOS 03/02/2007  . Allergic rhinitis 03/02/2007    Carey Bullocks, OTR/L 07/14/2016, 4:40 PM  [facility] [address] [place], [place], [address] Phone: [phone]   Fax:  [phone]  Name: Christina [name] MRN: [id number] Date of Birth: 1961-03-19

## 2016-07-15 ENCOUNTER — Encounter: Payer: Self-pay | Admitting: Physical Therapy

## 2016-07-15 ENCOUNTER — Ambulatory Visit: Payer: Managed Care, Other (non HMO) | Admitting: Physical Therapy

## 2016-07-15 DIAGNOSIS — R2681 Unsteadiness on feet: Secondary | ICD-10-CM

## 2016-07-15 DIAGNOSIS — M6281 Muscle weakness (generalized): Secondary | ICD-10-CM | POA: Diagnosis not present

## 2016-07-15 DIAGNOSIS — R2689 Other abnormalities of gait and mobility: Secondary | ICD-10-CM

## 2016-07-16 NOTE — Therapy (Signed)
[facility] [address] [place], [place], [address] Phone: [phone]   Fax:  [phone]  Physical Therapy Treatment  Patient Details  Name: Christina [name] MRN: [id number] Date of Birth: July 11, 1961 Referring Provider: Sherley Bounds  Encounter Date: 07/15/2016      PT End of Session - 07/15/16 1409    Visit Number 4   Number of Visits 9   Date for PT Re-Evaluation 08/12/16   Authorization Type Private Insurance   PT Start Time [id number]   PT Stop Time [id number]   PT Time Calculation (min) 41 min   Equipment Utilized During Treatment Gait belt   Activity Tolerance Patient tolerated treatment well   Behavior During Therapy Flat affect      Past Medical History:  Diagnosis Date  . Allergic rhinitis   . Fibroid   . Fibroids   . Grave's disease   . H/O Graves' disease   . H/O insomnia   . H/O menorrhagia 2012  . History of chicken pox   . Hypertension   . Hyperthyroidism    s/p rai 8/06 now hypothyroid Dr. Suzette Battiest  . Hypothyroid   . Infertility, female   . Leukemia ([facility])   . Menorrhagia    Had Novasure  . MPN (myeloproliferative neoplasm) ([facility]) 05/18/2015  . Polyp of cervix    Endometriel polyp. JO  . Vitamin D deficiency   . Vitamin D deficiency     Past Surgical History:  Procedure Laterality Date  . BUNIONECTOMY  1986  . CRANIOTOMY N/A 06/17/2016   Procedure: RIGHT CRANIOTOMY HEMATOMA EVACUATION SUBDURAL;  Surgeon: Eustace Moore, MD;  Location: [facility];  Service: Neurosurgery;  Laterality: N/A;  . CRANIOTOMY Right 06/18/2016   Procedure: CRANIOTOMY HEMATOMA EVACUATION SUBDURAL RIGHT;  Surgeon: Eustace Moore, MD;  Location: [facility];  Service: Neurosurgery;  Laterality: Right;  . DILATION AND CURETTAGE OF UTERUS  09/15/2010  . HYSTEROSCOPY W/D&C  09/28/2010  . MOUTH SURGERY    . NOVASURE ABLATION  09/28/2010  . REFRACTIVE SURGERY     for vision  . TONSILLECTOMY  1965    There were no vitals filed for this  visit.      Subjective Assessment - 07/15/16 1408    Subjective No new complaints. No falls or pain to report.  Headache is better. Reports the HEP is going well.   Patient is accompained by: Family member   Pertinent History Spouse reports having to help pt get dressed, dried off.     Limitations Other (comment)   Currently in Pain? No/denies   Pain Score 0-No pain              [facility] Adult PT Treatment/Exercise - 07/15/16 1410      Ambulation/Gait   Ambulation/Gait Yes   Ambulation/Gait Assistance 4: Min guard;5: Supervision   Ambulation/Gait Assistance Details cues for increased gait speed with PTA setting the pace at higher level than pt's self selected pace   Ambulation Distance (Feet) 345 Feet   Assistive device None   Gait Pattern Step-through pattern;Decreased stride length;Decreased trunk rotation   Ambulation Surface Level;Indoor     High Level Balance   High Level Balance Activities Marching forwards;Marching backwards;Tandem walking  tandem fwd/bwd, heel walking fwd/bwd, toe walking fwd/bwd   High Level Balance Comments on red mats with no UE support: performed 3 laps each/each way with min guard to min assist for balance. cues to slow down for more controll and balance.  Knee/Hip Exercises: Aerobic   Tread Mill x 6 minutes progressing from 1.7 to 2.4 mph with bil UE support, min guard assist with cues on posture.             Balance Exercises - 07/15/16 1440      Balance Exercises: Standing   SLS with Vectors Foam/compliant surface;Other reps (comment);Limitations   Balance Beam standing across blue foam beam: alternating fwd heel taps, alternating bwd toe taps x 10 each bil legs with min guard to min assist for balance, cues on posture and weight shifting.      Balance Exercises: Standing   SLS with Vectors Limitations 6 cones along edges of red mats: alternating toe taps to each cone with side stepping left<>right x 1 lap each way; alternating  double toe taps to each cone with side stepping left<>right x 1 lap each way. min guard to min assist for balance with cues on ex form/technique and to slow down for more controlled movements.                 PT Long Term Goals - 07/04/16 1835      PT LONG TERM GOAL #1   Title Patient will demonstrate decreased fall risk with DGI 19 or greater.   Time 4   Period Weeks   Status New     PT LONG TERM GOAL #2   Title Patient will ambulate indoor and outdoor surfaces 1000' independent no LOB including curb negotiation.    Time 4   Period Weeks   Status New     PT LONG TERM GOAL #3   Title Patient will be independent with HEP for balance and LE strength.    Time 4   Period Weeks   Status New     PT LONG TERM GOAL #4   Title Patient will tolerate 25 minutes continuous standing or walking activities without seated rest.    Time 4   Period Weeks   Status New     PT LONG TERM GOAL #5   Title Patient will demonstrate decreased fall risk with TUG cognitive 13.5 sec or less.   Time 4   Period Weeks   Status New               Plan - 07/15/16 1410    Clinical Impression Statement Today's skilled session address gait and high level balance activites without any issues reported. Pt was able to increase gait speed with both treadmill and around gym with cues. However, then needed cues to slow down with balance activities for more controlled movements and improved balance. Pt is making steady progress toward goals and shoudl benefit from continued PT to progress toward goals.    Rehab Potential Good   PT Frequency 2x / week   PT Duration 4 weeks   PT Treatment/Interventions ADLs/Self Care Home Management;Patient/family education;Stair training;Functional mobility training;Therapeutic activities;Therapeutic exercise;Balance training;DME Instruction;Gait training;Vestibular   PT Next Visit Plan dynamic gait activities, gait speed work on treadmill   PT Home Exercise Plan  Clamshell, SLR, Bridges, Balance at counter top   Consulted and Agree with Plan of Care Patient;Family member/caregiver   Family Member Consulted spouse      Patient will benefit from skilled therapeutic intervention in order to improve the following deficits and impairments:  Abnormal gait, Decreased activity tolerance, Decreased balance, Decreased coordination, Decreased mobility, Decreased endurance, Decreased strength, Impaired perceived functional ability, Decreased safety awareness, Decreased cognition  Visit Diagnosis: Unsteadiness on feet  Other abnormalities of gait and mobility  Muscle weakness (generalized)     Problem List Patient Active Problem List   Diagnosis Date Noted  . S/P craniotomy 06/17/2016  . Pancytopenia due to antineoplastic chemotherapy ([facility]) 06/30/2015  . Elevated diaphragm 06/30/2015  . DOE (dyspnea on exertion) 06/30/2015  . Diarrhea due to drug 06/30/2015  . CML (chronic myeloid leukemia) ([facility]) 05/18/2015  . Encounter for chemotherapy management 05/18/2015  . 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 Hyperuricemia 05/15/2015  . Essential hypertension 11/27/2014  . Shortness of breath 12/03/2013  . Hx of Graves' disease 12/03/2013  . Elevated blood pressure reading 12/03/2013  . Special screening for malignant neoplasms, colon 08/09/2013  . Insomnia 04/29/2012  . Medication management 04/29/2012  . Foot pain 04/29/2012  . Polyp of cervix   . Fibroids   . Vitamin D deficiency   . Cubital tunnel syndrome 09/05/2011  . TENDINITIS 12/19/2008  . HYPERTHYROIDISM 03/06/2007  . ARTHRALGIA 03/05/2007  . INSOMNIA 03/05/2007  . [place] DISEASE 03/02/2007  . LOSS, HEARING NOS 03/02/2007  . Allergic rhinitis 03/02/2007    Willow Ora, PTA, [facility] [address] [place], [place] [address] [phone] 07/16/16, 7:15 PM   Name: Christina [name] MRN: [id number] Date of Birth: 18-Jul-1961

## 2016-07-18 ENCOUNTER — Telehealth: Payer: Self-pay | Admitting: *Deleted

## 2016-07-18 ENCOUNTER — Ambulatory Visit: Payer: Managed Care, Other (non HMO) | Admitting: Occupational Therapy

## 2016-07-18 ENCOUNTER — Ambulatory Visit: Payer: Managed Care, Other (non HMO) | Admitting: *Deleted

## 2016-07-18 ENCOUNTER — Encounter: Payer: Self-pay | Admitting: Occupational Therapy

## 2016-07-18 DIAGNOSIS — R41842 Visuospatial deficit: Secondary | ICD-10-CM

## 2016-07-18 DIAGNOSIS — R2681 Unsteadiness on feet: Secondary | ICD-10-CM

## 2016-07-18 DIAGNOSIS — R278 Other lack of coordination: Secondary | ICD-10-CM

## 2016-07-18 DIAGNOSIS — I69254 Hemiplegia and hemiparesis following other nontraumatic intracranial hemorrhage affecting left non-dominant side: Secondary | ICD-10-CM

## 2016-07-18 DIAGNOSIS — R482 Apraxia: Secondary | ICD-10-CM

## 2016-07-18 DIAGNOSIS — M6281 Muscle weakness (generalized): Secondary | ICD-10-CM

## 2016-07-18 DIAGNOSIS — R2689 Other abnormalities of gait and mobility: Secondary | ICD-10-CM

## 2016-07-18 DIAGNOSIS — R29818 Other symptoms and signs involving the nervous system: Secondary | ICD-10-CM

## 2016-07-18 NOTE — Telephone Encounter (Signed)
Pt left VM asking if her appt w/ Dr. Alvy Bimler this Thursday at 8:30 am can be moved to later in the morning?  Her parents are driving her and cannot get there that early.

## 2016-07-18 NOTE — Therapy (Signed)
[facility] [address] [place], [place], [address] Phone: [phone]   Fax:  [phone]  Physical Therapy Treatment  Patient Details  Name: Christina [name] MRN: [id number] Date of Birth: 1960/09/14 Referring Provider: Sherley Bounds  Encounter Date: 07/18/2016      PT End of Session - 07/18/16 1345    Visit Number 5   Number of Visits 9   Date for PT Re-Evaluation 08/12/16   Authorization Type Private Insurance   PT Start Time [phone]   PT Stop Time 0930   PT Time Calculation (min) 45 min   Activity Tolerance Patient tolerated treatment well   Behavior During Therapy [facility] for tasks assessed/performed      Past Medical History:  Diagnosis Date  . Allergic rhinitis   . Fibroid   . Fibroids   . Grave's disease   . H/O Graves' disease   . H/O insomnia   . H/O menorrhagia 2012  . History of chicken pox   . Hypertension   . Hyperthyroidism    s/p rai 8/06 now hypothyroid Dr. Suzette Battiest  . Hypothyroid   . Infertility, female   . Leukemia ([place])   . Menorrhagia    Had Novasure  . 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 Polyp of cervix    Endometriel polyp. JO  . Vitamin D deficiency   . Vitamin D deficiency     Past Surgical History:  Procedure Laterality Date  . BUNIONECTOMY  1986  . CRANIOTOMY N/A 06/17/2016   Procedure: RIGHT CRANIOTOMY HEMATOMA EVACUATION SUBDURAL;  Surgeon: Eustace Moore, MD;  Location: [facility];  Service: Neurosurgery;  Laterality: N/A;  . CRANIOTOMY Right 06/18/2016   Procedure: CRANIOTOMY HEMATOMA EVACUATION SUBDURAL RIGHT;  Surgeon: Eustace Moore, MD;  Location: [facility];  Service: Neurosurgery;  Laterality: Right;  . DILATION AND CURETTAGE OF UTERUS  09/15/2010  . HYSTEROSCOPY W/D&C  09/28/2010  . MOUTH SURGERY    . NOVASURE ABLATION  09/28/2010  . REFRACTIVE SURGERY     for vision  . TONSILLECTOMY  1965    There were no vitals filed for this visit.      Subjective  Assessment - 07/18/16 0849    Subjective Doing well with home exercises.  Headache is better today.  Was able to do vacuuming and laundry yesterday about half an hour.  Got tired.   Pain Score 0-No pain                         [facility] Adult PT Treatment/Exercise - 07/18/16 0001      Ambulation/Gait   Ambulation/Gait Yes   Ambulation/Gait Assistance 4: Min guard;5: Supervision   Ambulation/Gait Assistance Details cues for noting cars on L   Ambulation Distance (Feet) 1000 Feet  and 350 x 2 indoors   Assistive device None   Gait Pattern Step-through pattern;Decreased stride length   Ambulation Surface Level;Indoor;Unlevel;Outdoor;Paved;Grass   Curb 5: Supervision   Curb Details (indicate cue type and reason) for safety with slower speed   Gait Comments also with tossing ball to self naming animals through alphabet     High Level Balance   High Level Balance Activities Side stepping;Backward walking;Head turns;Tandem walking;Negotiating over obstacles   High Level Balance Comments on compliant surface with braiding, tandem gait, kicking over and righting cones, walking over cones with taps for step length and single limb stance, no UE support, minguard to S level A  Knee/Hip Exercises: Aerobic   Tread Mill x 6 minutes 2.4-2.6 mph bilateral UE support and S for safety except last minute no UE support close SBA to minguard and occasional touching with UE's for balance     Knee/Hip Exercises: Machines for Strengthening   Cybex Leg Press 10 x 2 @ 65#                     PT Long Term Goals - 07/04/16 1835      PT LONG TERM GOAL #1   Title Patient will demonstrate decreased fall risk with DGI 19 or greater.   Time 4   Period Weeks   Status New     PT LONG TERM GOAL #2   Title Patient will ambulate indoor and outdoor surfaces 1000' independent no LOB including curb negotiation.    Time 4   Period Weeks   Status New     PT LONG TERM GOAL #3   Title  Patient will be independent with HEP for balance and LE strength.    Time 4   Period Weeks   Status New     PT LONG TERM GOAL #4   Title Patient will tolerate 25 minutes continuous standing or walking activities without seated rest.    Time 4   Period Weeks   Status New     PT LONG TERM GOAL #5   Title Patient will demonstrate decreased fall risk with TUG cognitive 13.5 sec or less.   Time 4   Period Weeks   Status New               Plan - 07/18/16 1346    Clinical Impression Statement Patient progressing rapidly with gait speed, confidence, endurance, and balance.  Feel she is compensating well for deficits, but still S level in community due to close to objects on L side when looking R for conversation.  Feel she may need further cognitive testing especially prior to return to work.  Will need continued skilled PT to progress to goals.   Rehab Potential Good   PT Frequency 2x / week   PT Duration 4 weeks   PT Treatment/Interventions ADLs/Self Care Home Management;Patient/family education;Stair training;Functional mobility training;Therapeutic activities;Therapeutic exercise;Balance training;DME Instruction;Gait training;Vestibular   PT Next Visit Plan progress on treadmill without UE support, continue high level balance and cognitive tasks   PT Home Exercise Plan Clamshell, SLR, Bridges, Balance at counter top   Consulted and Agree with Plan of Care Patient      Patient will benefit from skilled therapeutic intervention in order to improve the following deficits and impairments:  Abnormal gait, Decreased activity tolerance, Decreased balance, Decreased coordination, Decreased mobility, Decreased endurance, Decreased strength, Impaired perceived functional ability, Decreased safety awareness, Decreased cognition  Visit Diagnosis: Unsteadiness on feet  Other abnormalities of gait and mobility  Muscle weakness (generalized)  Other lack of coordination     Problem  List Patient Active Problem List   Diagnosis Date Noted  . S/P craniotomy 06/17/2016  . Pancytopenia due to antineoplastic chemotherapy ([place]) 06/30/2015  . Elevated diaphragm 06/30/2015  . DOE (dyspnea on exertion) 06/30/2015  . Diarrhea due to drug 06/30/2015  . CML (chronic myeloid leukemia) ([place]) 05/18/2015  . Encounter for chemotherapy management 05/18/2015  . 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 Hyperuricemia 05/15/2015  . Essential hypertension 11/27/2014  . Shortness of breath 12/03/2013  . Hx of Graves' disease 12/03/2013  . Elevated blood pressure reading 12/03/2013  . Special screening  for malignant neoplasms, colon 08/09/2013  . Insomnia 04/29/2012  . Medication management 04/29/2012  . Foot pain 04/29/2012  . Polyp of cervix   . Fibroids   . Vitamin D deficiency   . Cubital tunnel syndrome 09/05/2011  . TENDINITIS 12/19/2008  . HYPERTHYROIDISM 03/06/2007  . ARTHRALGIA 03/05/2007  . INSOMNIA 03/05/2007  . [place] DISEASE 03/02/2007  . LOSS, HEARING NOS 03/02/2007  . Allergic rhinitis 03/02/2007    Reginia Naas 07/18/2016, 1:50 PM Magda Kiel, PT [phone] 07/18/2016  [facility] [address] [place] [address] [place], [place], [address] Phone: [phone]   Fax:  [phone]  Name: Christina [name] MRN: [id number] Date of Birth: 02-13-1961

## 2016-07-18 NOTE — Therapy (Signed)
[facility] [address] [place], [place], [address] Phone: [phone]   Fax:  [phone]  Occupational Therapy Treatment  Patient Details  Name: ADAYAH GENSON MRN: [id number] Date of Birth: March 28, 1961 Referring Provider: Sherley Bounds  Encounter Date: 07/18/2016      OT End of Session - 07/18/16 1046    Visit Number 4   Number of Visits 16   Date for OT Re-Evaluation 08/29/16   Authorization Type UMR no  visit limit   OT Start Time 0930   OT Stop Time 1015   OT Time Calculation (min) 45 min   Activity Tolerance Patient tolerated treatment well   Behavior During Therapy [facility] for tasks assessed/performed      Past Medical History:  Diagnosis Date  . Allergic rhinitis   . Fibroid   . Fibroids   . Grave's disease   . H/O Graves' disease   . H/O insomnia   . H/O menorrhagia 2012  . History of chicken pox   . Hypertension   . Hyperthyroidism    s/p rai 8/06 now hypothyroid Dr. Suzette Battiest  . Hypothyroid   . Infertility, female   . Leukemia ([place])   . Menorrhagia    Had Novasure  . 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 Polyp of cervix    Endometriel polyp. JO  . Vitamin D deficiency   . Vitamin D deficiency     Past Surgical History:  Procedure Laterality Date  . BUNIONECTOMY  1986  . CRANIOTOMY N/A 06/17/2016   Procedure: RIGHT CRANIOTOMY HEMATOMA EVACUATION SUBDURAL;  Surgeon: Eustace Moore, MD;  Location: [facility];  Service: Neurosurgery;  Laterality: N/A;  . CRANIOTOMY Right 06/18/2016   Procedure: CRANIOTOMY HEMATOMA EVACUATION SUBDURAL RIGHT;  Surgeon: Eustace Moore, MD;  Location: [facility];  Service: Neurosurgery;  Laterality: Right;  . DILATION AND CURETTAGE OF UTERUS  09/15/2010  . HYSTEROSCOPY W/D&C  09/28/2010  . MOUTH SURGERY    . NOVASURE ABLATION  09/28/2010  . REFRACTIVE SURGERY     for vision  . TONSILLECTOMY  1965    There were no vitals filed for this visit.      Subjective  Assessment - 07/18/16 1032    Subjective  I am totally dressing myself.  I can tie my shoes.  I even did my hair and make up today.  I am going to get back.   Patient Stated Goals To use my hand better   Currently in Pain? No/denies   Pain Score 0-No pain            [facility] OT Assessment - 07/18/16 0001      Coordination   9 Hole Peg Test Left;Right   Right 9 Hole Peg Test 20.43   Left 9 Hole Peg Test 1.01     Hand Function   Left Hand Gross Grasp Impaired   Left Hand Grip (lbs) 38                  OT Treatments/Exercises (OP) - 07/18/16 1033      ADLs   UB Dressing Patient reports improved independence with ADL- now dressing herself.   LB Dressing Patient reports that she can now dress her lower body without help - including socks, shoes, and shoe tying.     Work Patient is an Nurse, children's, who works at [employer].  She does a fair amount of hearing Cabin crew, and wants improved fine motor skills for fine tool  use.     ADL Comments Patient progressing toward goals due to improved strength and coordination and functional use of left hand.       Fine Motor Coordination   In Hand Manipulation Training Patient with decreased coordination in left hand - most notable in 4th / 5th digit.  Patient with increased stiffness, and significant apraxia.  Patient did well with very specific feedback, and with use of right UE as a model for more natural motor patterns.  Patient very challenged initially to change the shape of her left hand to allow two steel balls to rotate around each other.  Encouraged patient to try this at home with 2 golf balls.       Neurological Re-education Exercises   Other Exercises 1 Patient with diminished sensation in left upper extremity.  Patient with active movement, but poorly graded isolated control.  Patient lacks full active MCP extension in 4th and 5th digits, and thumb is over active with all fine motor tasks.  Patient does best with visual feedback to  improve motor patterning in LUE.  Patient needs frequent intermittent cueing to reduce compensatory shoulder and trunk movement with disatl hand function.                 OT Education - 07/18/16 1046    Education provided Yes   Education Details rotate two golf balss in hand - left   Person(s) Educated Patient   Methods Explanation;Demonstration;Tactile cues;Verbal cues   Comprehension Verbalized understanding;Returned demonstration          OT Short Term Goals - 07/18/16 1048      OT SHORT TERM GOAL #1   Title Pt and husband will be mod I with HEP - 08/01/2016   Status On-going     OT SHORT TERM GOAL #2   Title Pt will be min a with dressing UB   Status Achieved     OT SHORT TERM GOAL #3   Title Pt will be mod a with dressing LB   Status Achieved     OT SHORT TERM GOAL #4   Title Pt will be mod I  for bathing   Status On-going     OT SHORT TERM GOAL #5   Title Pt will demonstate improved grip strength by at least 5 pounds (baseline= 25) to assist with functional tasks   Status Achieved     OT SHORT TERM GOAL #6   Title Pt will be mod I with organizing meds   Status On-going     OT SHORT TERM GOAL #7   Title Pt will be mod I with tub transfers   Status On-going     OT SHORT TERM GOAL #8   Title Pt will improve on Box and blocks by 5 blocks (baseline=14) with LUE as evidence of improved UE function   Status On-going     OT SHORT TERM GOAL  #9   TITLE Pt will attend to L field with min vc's.   Status On-going           OT Long Term Goals - 07/18/16 1048      OT LONG TERM GOAL #1   Title Pt will be mod I with HEP - 08/29/2016   Status On-going     OT LONG TERM GOAL #2   Title Pt will be mod I with dressing   Status On-going     OT LONG TERM GOAL #3   Title Pt will improve grip  strength RUE by 8 pounds to assist with functional tasks (baseline= 25)   Status Achieved     OT LONG TERM GOAL #4   Title Pt will improve on Box and Blocks by at  least 8 blocks as evidence of improved UE function   Status On-going     OT LONG TERM GOAL #5   Title Pt will be mod I with simple familiar meal prep   Status On-going     OT LONG TERM GOAL #6   Title Pt will demonstrate ability to lift 3 pound object overhead 5 times without dropping with LUE   Status On-going     OT LONG TERM GOAL #7   Title Pt will demonstate ability to carry plate of food from counter to table with LUE   Status On-going     OT LONG TERM GOAL #8   Title Pt will be able to complete 9 hole peg test in 2 minutes or less as demonstration of improved coordination   Status Achieved     OT LONG TERM GOAL  #9   Baseline Pt will be mod I with meds (including opening containers)   Status On-going     OT LONG TERM GOAL  #10   TITLE Pt will be mod I with simple laundry, clean up tasks in home.   Status On-going               Plan - 07/18/16 1047    Clinical Impression Statement Patient progressing toward OT goals.  Patient very motivated for improved functional performance.   Rehab Potential Good   OT Frequency 2x / week   OT Duration 8 weeks   OT Treatment/Interventions Self-care/ADL training;Moist Heat;Electrical Stimulation;Fluidtherapy;DME and/or AE instruction;Energy conservation;Neuromuscular education;Therapeutic exercise;Functional Mobility Training;Manual Therapy;Passive range of motion;Splinting;Therapeutic activities;Patient/family education;Visual/perceptual remediation/compensation;Cognitive remediation/compensation   Plan proximal strengthening, NMR LUE   Consulted and Agree with Plan of Care Patient      Patient will benefit from skilled therapeutic intervention in order to improve the following deficits and impairments:  Decreased activity tolerance, Decreased endurance, Decreased coordination, Decreased cognition, Decreased knowledge of use of DME, Decreased range of motion, Decreased strength, Impaired UE functional use, Impaired tone, Impaired  sensation, Impaired vision/preception  Visit Diagnosis: Unsteadiness on feet  Muscle weakness (generalized)  Other lack of coordination  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 S/P craniotomy 06/17/2016  . Pancytopenia due to antineoplastic chemotherapy ([place]) 06/30/2015  . Elevated diaphragm 06/30/2015  . DOE (dyspnea on exertion) 06/30/2015  . Diarrhea due to drug 06/30/2015  . CML (chronic myeloid leukemia) ([place]) 05/18/2015  . Encounter for chemotherapy management 05/18/2015  . 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 Hyperuricemia 05/15/2015  . Essential hypertension 11/27/2014  . Shortness of breath 12/03/2013  . Hx of Graves' disease 12/03/2013  . Elevated blood pressure reading 12/03/2013  . Special screening for malignant neoplasms, colon 08/09/2013  . Insomnia 04/29/2012  . Medication management 04/29/2012  . Foot pain 04/29/2012  . Polyp of cervix   . Fibroids   . Vitamin D deficiency   . Cubital tunnel syndrome 09/05/2011  . TENDINITIS 12/19/2008  . HYPERTHYROIDISM 03/06/2007  . ARTHRALGIA 03/05/2007  . INSOMNIA 03/05/2007  . [place] DISEASE 03/02/2007  . LOSS, HEARING NOS 03/02/2007  . Allergic rhinitis 03/02/2007  Mariah Milling, OTR/L 07/18/2016, 10:50 AM  [facility] [address] [place], [place], [address] Phone: [phone]   Fax:  [phone]  Name: LAKSHANA DRUMWRIGHT MRN: [id number] Date of Birth: Jan 15, 1961

## 2016-07-19 ENCOUNTER — Telehealth: Payer: Self-pay | Admitting: *Deleted

## 2016-07-19 ENCOUNTER — Ambulatory Visit: Payer: Managed Care, Other (non HMO) | Admitting: Occupational Therapy

## 2016-07-19 ENCOUNTER — Encounter: Payer: Self-pay | Admitting: Occupational Therapy

## 2016-07-19 DIAGNOSIS — R482 Apraxia: Secondary | ICD-10-CM

## 2016-07-19 DIAGNOSIS — R29818 Other symptoms and signs involving the nervous system: Secondary | ICD-10-CM

## 2016-07-19 DIAGNOSIS — R41842 Visuospatial deficit: Secondary | ICD-10-CM

## 2016-07-19 DIAGNOSIS — R278 Other lack of coordination: Secondary | ICD-10-CM

## 2016-07-19 DIAGNOSIS — R41844 Frontal lobe and executive function deficit: Secondary | ICD-10-CM

## 2016-07-19 DIAGNOSIS — M6281 Muscle weakness (generalized): Secondary | ICD-10-CM

## 2016-07-19 DIAGNOSIS — I69254 Hemiplegia and hemiparesis following other nontraumatic intracranial hemorrhage affecting left non-dominant side: Secondary | ICD-10-CM

## 2016-07-19 NOTE — Telephone Encounter (Signed)
She can check in at 12 to be seen at 1230 pm (over lunch hour)

## 2016-07-19 NOTE — Telephone Encounter (Signed)
LVM for pt informing her she can come at 12 noon for 12:30 pm appt to see Dr. Alvy Bimler on 12/14.  Urgent Scheduling message sent to change the time.  (Pt called yesterday asking for later in day appt on 12/14- she cannot get here early as scheduled).

## 2016-07-19 NOTE — Patient Instructions (Signed)
Local Driver Evaluation Programs: ° °Comprehensive Evaluation: includes clinical and in vehicle behind the wheel testing by OCCUPATIONAL THERAPIST. Programs have varying levels of adaptive controls available for trial.  ° °[facility] °[address] °[place], [place]  [address] °[phone] or [phone] °http://www.driver-rehab.com °Evaluator:  Cyndee Crompton, OT/CDRS/CDI/SCDCM/Low Vision Certification ° °[facility] °[address] °[place], [place] [address] °[phone] °https://www.[facility].org/home/services/rehabilitation.aspx °Evaluators:  Shannon Sheek, OT and Jill Tucker, OT ° °[facility] [place] [facility] (ONLY SERVES VETERANS!!) °[facility] °[address] °[place], [place]  [address] °[phone] °http://www.[place].va.gov/services/Physical_Medicine_Rehabilitation_Services.asp °Evaluators:  Eric Andrews, KT; Heidi Harris, KT;  Gary Whitaker, KT (KT=kiniesotherapist) ° ° °Clinical evaluations only:  Includes clinical testing, refers to other programs or local certified driving instructor for behind the wheel testing. ° °[facility] (outpatient Rehab) °[facility] °[address] °[place], [place] [address] °[phone] for scheduling °http://www.[facility].edu/Outpatient-Rehabilitation/Neurorehabilitation-Therapy.htm °Evaluators:  Kelly Lambeth, OT; Kate Phillips, OT ° °Other area clinical evaluators available upon request including [facility], [facility] and [facility]. ° ° °    Resource List °What is a Driver Evaluation: °Your Road Ahead - A Guide to Comprehensive Driving Evaluations °http://www.thehartford.com/resources/mature-market-excellence/publications-on-aging ° °Association for [employer] - Disability and Driving Fact Sheets °http://www.aded.net/?page=510 ° °Driving after a Brain  Injury: °[employer] °http://www.biausa.org/tbims-abstracts/if-there-is-an-effective-way-to-determine-if-someone-is-ready-to-drive-after-tbi?A=SearchResult&SearchID=9495675&ObjectID=2758842&ObjectType=35 ° °Driving with Adaptive Equipment: °[facility] °http://www.driver-rehab.com/adaptive-equipment ° °[employer] °http://www.nmeda.com/ ° ° ° ° ° ° °  °

## 2016-07-19 NOTE — Therapy (Signed)
[facility] [address] [place], [place], [address] Phone: [phone]   Fax:  [phone]  Occupational Therapy Treatment  Patient Details  Name: Christina [name] MRN: [id number] Date of Birth: [birth year] Referring Provider: Sherley Bounds  Encounter Date: 07/19/2016      OT End of Session - 07/19/16 1100    Visit Number 5   Number of Visits 16   Date for OT Re-Evaluation 08/29/16   Authorization Type UMR no  visit limit   OT Start Time 0801   OT Stop Time 0844   OT Time Calculation (min) 43 min   Activity Tolerance Patient tolerated treatment well      Past Medical History:  Diagnosis Date  . Allergic rhinitis   . Fibroid   . Fibroids   . Grave's disease   . H/O Graves' disease   . H/O insomnia   . H/O menorrhagia 2012  . History of chicken pox   . Hypertension   . Hyperthyroidism    s/p rai 8/06 now hypothyroid Dr. Suzette Battiest  . Hypothyroid   . Infertility, female   . Leukemia ([place])   . Menorrhagia    Had Novasure  . 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 Polyp of cervix    Endometriel polyp. JO  . Vitamin D deficiency   . Vitamin D deficiency     Past Surgical History:  Procedure Laterality Date  . BUNIONECTOMY  1986  . CRANIOTOMY N/A 06/17/2016   Procedure: RIGHT CRANIOTOMY HEMATOMA EVACUATION SUBDURAL;  Surgeon: Eustace Moore, MD;  Location: [facility];  Service: Neurosurgery;  Laterality: N/A;  . CRANIOTOMY Right 06/18/2016   Procedure: CRANIOTOMY HEMATOMA EVACUATION SUBDURAL RIGHT;  Surgeon: Eustace Moore, MD;  Location: [facility];  Service: Neurosurgery;  Laterality: Right;  . DILATION AND CURETTAGE OF UTERUS  09/15/2010  . HYSTEROSCOPY W/D&C  09/28/2010  . MOUTH SURGERY    . NOVASURE ABLATION  09/28/2010  . REFRACTIVE SURGERY     for vision  . TONSILLECTOMY  1965    There were no vitals filed for this visit.      Subjective Assessment - 07/19/16 0804    Patient is accompained by:  Family member  husband   Patient Stated Goals To use my hand better   Currently in Pain? Yes   Pain Location Head   Pain Orientation Right   Pain Descriptors / Indicators Aching   Pain Type Chronic pain   Pain Onset 1 to 4 weeks ago   Pain Frequency Intermittent   Aggravating Factors  unknown   Pain Relieving Factors tylenol                      OT Treatments/Exercises (OP) - 07/19/16 0001      ADLs   Driving Discussed possible return to driving.  Pt with decreasd insight stating "I think I could start driving next week".  Pt made this statement shortly after discussing her L neglect which pt was able to state that she still has some trouble attending to the left but that it is improving.  Strongly recommneded that pt have a formal drving eval - pt and husband in agreement.  Provided written information and reviewed with pt and husband for referral. Pt to obtain medical clearance and then follow up directly to make appt.     Work Addressed possible return to work, Pt stated she feels she could go back first week or two of  January. Recommended that pt may want to wait until the end of January and then attempt to return 3 days a week at 4 hours a day initially.  Also strongly recommended that pt seek neuropsych eval prior to return to work as her job requires high level cognitive skills and in clinic pt demonstrating decreased insight, decrease abstract thinking, decreased alterntaing attention, decreased problem solving. Pt and husband show poor insight into cognitive deficits.  Pt and husband have agreed to neuropsych testing and will provide referral info at next visit.  Pt concerned "they will give my job away."  Discussed FMLA and that pt's job is protected for 12 weeks.  Pt needed additional time and min vc's to figure out when 12 weeks was from injury. Pt and husband verbalized understanding.    ADL Comments Assessed STG's - see goal section for update.  Discussed progress with  pt and husband.       Hand Exercises   Other Hand Exercises Upgraded HEP to use green putty for grip and added activities for finger strength with red putty. Pt able to return demonstrate.                 OT Education - 07/19/16 1055    Education provided Yes   Education Details driving eval, upgrade HEP for hand strength   Person(s) Educated Patient   Methods Explanation;Demonstration;Verbal cues;Handout   Comprehension Verbalized understanding;Returned demonstration          OT Short Term Goals - 07/19/16 1055      OT SHORT TERM GOAL #1   Title Pt and husband will be mod I with HEP - 08/01/2016   Status Achieved     OT SHORT TERM GOAL #2   Title Pt will be min a with dressing UB   Status Achieved     OT SHORT TERM GOAL #3   Title Pt will be mod a with dressing LB   Status Achieved     OT SHORT TERM GOAL #4   Title Pt will be mod I  for bathing   Status Achieved     OT SHORT TERM GOAL #5   Title Pt will demonstate improved grip strength by at least 5 pounds (baseline= 25) to assist with functional tasks   Status Achieved  40 pounds     OT SHORT TERM GOAL #6   Title Pt will be mod I with organizing meds   Status Achieved     OT SHORT TERM GOAL #7   Title Pt will be mod I with tub transfers   Status Achieved     OT SHORT TERM GOAL #8   Title Pt will improve on Box and blocks by 5 blocks (baseline=14) with LUE as evidence of improved UE function   Status Achieved  34     OT SHORT TERM GOAL  #9   TITLE Pt will attend to L field with min vc's.   Status Achieved  per observation and pt and husband report           OT Long Term Goals - 07/19/16 1056      OT LONG TERM GOAL #1   Title Pt will be mod I with HEP - 08/29/2016   Status On-going     OT LONG TERM GOAL #2   Title Pt will be mod I with dressing   Status Achieved     OT LONG TERM GOAL #3   Title Pt will improve grip strength  RUE to at least 45 pounds to assist with functional  activities. (baseline= 25)   Status Revised     OT LONG TERM GOAL #4   Title Pt will improve on Box and Blocks by at least 8 blocks as evidence of improved UE function   Status Achieved  34     OT LONG TERM GOAL #5   Title Pt will be mod I with simple familiar meal prep   Status On-going     OT LONG TERM GOAL #6   Title Pt will demonstrate ability to lift 3 pound object overhead 5 times without dropping with LUE   Status On-going     OT LONG TERM GOAL #7   Title Pt will demonstate ability to carry plate of food from counter to table with LUE   Status On-going     OT LONG TERM GOAL #8   Title Pt will be able to complete 9 hole peg test in 2 minutes or less as demonstration of improved coordination   Status Achieved     OT LONG TERM GOAL  #9   Baseline Pt will be mod I with meds (including opening containers)   Status On-going     OT LONG TERM GOAL  #10   TITLE Pt will be mod I with simple laundry, clean up tasks in home.   Status On-going               Plan - 07/19/16 1058    Clinical Impression Statement Pt making excellent progress toward goals. Pt with much brighter affect however demonstrates higher level cognitive deficits. Pt refusing ST but has agreed to neurospych testing prior to attempting return to work.    Rehab Potential Good   OT Frequency 2x / week   OT Duration 8 weeks   OT Treatment/Interventions Self-care/ADL training;Moist Heat;Electrical Stimulation;Fluidtherapy;DME and/or AE instruction;Energy conservation;Neuromuscular education;Therapeutic exercise;Functional Mobility Training;Manual Therapy;Passive range of motion;Splinting;Therapeutic activities;Patient/family education;Visual/perceptual remediation/compensation;Cognitive remediation/compensation   Plan proximal strength of LUE, NMR LUE, higher cognitive skills., environmental scanning. alternating attention in busy environment   Consulted and Agree with Plan of Care Patient   Family Member  Consulted husband Annie Main      Patient will benefit from skilled therapeutic intervention in order to improve the following deficits and impairments:  Decreased activity tolerance, Decreased endurance, Decreased coordination, Decreased cognition, Decreased knowledge of use of DME, Decreased range of motion, Decreased strength, Impaired UE functional use, Impaired tone, Impaired sensation, Impaired vision/preception  Visit Diagnosis: Muscle weakness (generalized)  Other lack of coordination  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 S/P craniotomy 06/17/2016  . Pancytopenia due to antineoplastic chemotherapy ([facility]) 06/30/2015  . Elevated diaphragm 06/30/2015  . DOE (dyspnea on exertion) 06/30/2015  . Diarrhea due to drug 06/30/2015  . CML (chronic myeloid leukemia) ([facility]) 05/18/2015  . Encounter for chemotherapy management 05/18/2015  . 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 Hyperuricemia 05/15/2015  . Essential hypertension 11/27/2014  . Shortness of breath 12/03/2013  . Hx of Graves' disease 12/03/2013  . Elevated blood pressure reading 12/03/2013  . Special screening for malignant neoplasms, colon 08/09/2013  . Insomnia 04/29/2012  . Medication management 04/29/2012  . Foot pain 04/29/2012  . Polyp of cervix   . Fibroids   .  Vitamin D deficiency   . Cubital tunnel syndrome 09/05/2011  . TENDINITIS 12/19/2008  . HYPERTHYROIDISM 03/06/2007  . ARTHRALGIA 03/05/2007  . INSOMNIA 03/05/2007  . [place] DISEASE 03/02/2007  . LOSS, HEARING NOS 03/02/2007  . Allergic rhinitis 03/02/2007    Quay Burow, OTR/L 07/19/2016, 11:01 AM  [facility] [address] [place], [place], [address] Phone: [phone]   Fax:  [phone]  Name: Christina [name] MRN: [id number] Date of Birth: 10/09/1960

## 2016-07-20 ENCOUNTER — Encounter: Payer: Self-pay | Admitting: Physical Therapy

## 2016-07-20 ENCOUNTER — Ambulatory Visit: Payer: Managed Care, Other (non HMO) | Admitting: Physical Therapy

## 2016-07-20 DIAGNOSIS — M6281 Muscle weakness (generalized): Secondary | ICD-10-CM | POA: Diagnosis not present

## 2016-07-20 DIAGNOSIS — R2681 Unsteadiness on feet: Secondary | ICD-10-CM

## 2016-07-20 DIAGNOSIS — R2689 Other abnormalities of gait and mobility: Secondary | ICD-10-CM

## 2016-07-20 NOTE — Therapy (Signed)
[facility] [address] [place], [place], [address] Phone: [phone]   Fax:  [phone]  Physical Therapy Treatment  Patient Details  Name: Christina [name] MRN: [id number] Date of Birth: 09/29/1960 Referring Provider: Sherley Bounds  Encounter Date: 07/20/2016      PT End of Session - 07/20/16 0809    Visit Number 6   Number of Visits 9   Date for PT Re-Evaluation 08/12/16   Authorization Type Private Insurance   PT Start Time [phone]   PT Stop Time 0845   PT Time Calculation (min) 39 min   Equipment Utilized During Treatment Gait belt   Activity Tolerance Patient tolerated treatment well   Behavior During Therapy [facility] for tasks assessed/performed      Past Medical History:  Diagnosis Date  . Allergic rhinitis   . Fibroid   . Fibroids   . Grave's disease   . H/O Graves' disease   . H/O insomnia   . H/O menorrhagia 2012  . History of chicken pox   . Hypertension   . Hyperthyroidism    s/p rai 8/06 now hypothyroid Dr. Suzette Battiest  . Hypothyroid   . Infertility, female   . Leukemia ([place])   . Menorrhagia    Had Novasure  . 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 Polyp of cervix    Endometriel polyp. JO  . Vitamin D deficiency   . Vitamin D deficiency     Past Surgical History:  Procedure Laterality Date  . BUNIONECTOMY  1986  . CRANIOTOMY N/A 06/17/2016   Procedure: RIGHT CRANIOTOMY HEMATOMA EVACUATION SUBDURAL;  Surgeon: Eustace Moore, MD;  Location: [facility];  Service: Neurosurgery;  Laterality: N/A;  . CRANIOTOMY Right 06/18/2016   Procedure: CRANIOTOMY HEMATOMA EVACUATION SUBDURAL RIGHT;  Surgeon: Eustace Moore, MD;  Location: [facility];  Service: Neurosurgery;  Laterality: Right;  . DILATION AND CURETTAGE OF UTERUS  09/15/2010  . HYSTEROSCOPY W/D&C  09/28/2010  . MOUTH SURGERY    . NOVASURE ABLATION  09/28/2010  . REFRACTIVE SURGERY     for vision  . TONSILLECTOMY  1965    There were no vitals  filed for this visit.      Subjective Assessment - 07/20/16 0809    Subjective No new complaints.    Patient is accompained by: Family member   Pertinent History Spouse reports having to help pt get dressed, dried off.     Limitations Other (comment)             [facility] Adult PT Treatment/Exercise - 07/20/16 0818      Transfers   Transfers --     Ambulation/Gait   Ambulation/Gait Yes   Ambulation/Gait Assistance 4: Min guard;5: Supervision   Ambulation/Gait Assistance Details around track while tossing ball to self and naming foods A-Z, cues to stay on task of ball tossing while thinking of food names. minimal leading cues needed at times for food item names.   Assistive device None   Gait Pattern Step-through pattern;Decreased stride length   Ambulation Surface Level;Indoor     High Level Balance   High Level Balance Activities Braiding;Head turns;Tandem walking;Negotiating over obstacles  tandem fwd/bwd, head mvmts x 2 directions fwd/bwd, toe/heel    High Level Balance Comments on both red mats: 3 laps each with min guard to min assist for balance with tandem walking, toe walking, heel walking  (all fwd/bwd) and braiding.; set up hurdles of vaired heights on red mats: reciprocal stepping  forward over hurdles x 4 laps with min guard to min assist for balance.      Knee/Hip Exercises: Aerobic   Tread Mill x 6 minutes with no UE support 2.4-2.8 mph, cues on posture and to widen base of support for more normalized base of support             Balance Exercises - 07/20/16 0821      Balance Exercises: Standing   Gait with Head Turns Forward;Other reps (comment);Limitations   Other Standing Exercises gait in ~50 foot hallway: forward gait with head movements up<>down and left<>right x 4 laps each, min guard to supervision assist for balance. decreased gait speed and minor veering off course noted with head movements (laterally > vertically)            PT Long Term  Goals - 07/04/16 1835      PT LONG TERM GOAL #1   Title Patient will demonstrate decreased fall risk with DGI 19 or greater.   Time 4   Period Weeks   Status New     PT LONG TERM GOAL #2   Title Patient will ambulate indoor and outdoor surfaces 1000' independent no LOB including curb negotiation.    Time 4   Period Weeks   Status New     PT LONG TERM GOAL #3   Title Patient will be independent with HEP for balance and LE strength.    Time 4   Period Weeks   Status New     PT LONG TERM GOAL #4   Title Patient will tolerate 25 minutes continuous standing or walking activities without seated rest.    Time 4   Period Weeks   Status New     PT LONG TERM GOAL #5   Title Patient will demonstrate decreased fall risk with TUG cognitive 13.5 sec or less.   Time 4   Period Weeks   Status New            Plan - 07/20/16 0809    Clinical Impression Statement Today's skilled session continued to address gait speed/deficits and high level balance activities without any issues reported. Pt is making steady progress toward goals and should benefit from continued PT to progress toward unmet goals.    Rehab Potential Good   PT Frequency 2x / week   PT Duration 4 weeks   PT Treatment/Interventions ADLs/Self Care Home Management;Patient/family education;Stair training;Functional mobility training;Therapeutic activities;Therapeutic exercise;Balance training;DME Instruction;Gait training;Vestibular   PT Next Visit Plan progress on treadmill without UE support, continue high level balance and cognitive tasks   PT Home Exercise Plan Clamshell, SLR, Bridges, Balance at counter top   Consulted and Agree with Plan of Care Patient      Patient will benefit from skilled therapeutic intervention in order to improve the following deficits and impairments:  Abnormal gait, Decreased activity tolerance, Decreased balance, Decreased coordination, Decreased mobility, Decreased endurance, Decreased  strength, Impaired perceived functional ability, Decreased safety awareness, Decreased cognition  Visit Diagnosis: Unsteadiness on feet  Other abnormalities of gait and mobility  Muscle weakness (generalized)     Problem List Patient Active Problem List   Diagnosis Date Noted  . S/P craniotomy 06/17/2016  . Pancytopenia due to antineoplastic chemotherapy ([place]) 06/30/2015  . Elevated diaphragm 06/30/2015  . DOE (dyspnea on exertion) 06/30/2015  . Diarrhea due to drug 06/30/2015  . CML (chronic myeloid leukemia) ([place]) 05/18/2015  . Encounter for chemotherapy management 05/18/2015  . 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  Hyperuricemia 05/15/2015  . Essential hypertension 11/27/2014  . Shortness of breath 12/03/2013  . Hx of Graves' disease 12/03/2013  . Elevated blood pressure reading 12/03/2013  . Special screening for malignant neoplasms, colon 08/09/2013  . Insomnia 04/29/2012  . Medication management 04/29/2012  . Foot pain 04/29/2012  . Polyp of cervix   . Fibroids   . Vitamin D deficiency   . Cubital tunnel syndrome 09/05/2011  . TENDINITIS 12/19/2008  . HYPERTHYROIDISM 03/06/2007  . ARTHRALGIA 03/05/2007  . INSOMNIA 03/05/2007  . [place] DISEASE 03/02/2007  . LOSS, HEARING NOS 03/02/2007  . Allergic rhinitis 03/02/2007    Willow Ora, PTA, [facility] [address] [place], [place] [address] [phone] 07/20/16, 11:32 AM   Name: ETOLIA SPATAFORA MRN: [id number] Date of Birth: October 22, 1960

## 2016-07-21 ENCOUNTER — Ambulatory Visit (HOSPITAL_BASED_OUTPATIENT_CLINIC_OR_DEPARTMENT_OTHER): Payer: Managed Care, Other (non HMO) | Admitting: Hematology and Oncology

## 2016-07-21 ENCOUNTER — Telehealth: Payer: Self-pay | Admitting: Hematology and Oncology

## 2016-07-21 ENCOUNTER — Other Ambulatory Visit: Payer: Self-pay | Admitting: *Deleted

## 2016-07-21 ENCOUNTER — Encounter: Payer: Self-pay | Admitting: Hematology and Oncology

## 2016-07-21 VITALS — BP 119/57 | HR 72 | Temp 97.6°F | Resp 18 | Ht 68.0 in | Wt 176.0 lb

## 2016-07-21 DIAGNOSIS — C9210 Chronic myeloid leukemia, BCR/ABL-positive, not having achieved remission: Secondary | ICD-10-CM

## 2016-07-21 DIAGNOSIS — Z8679 Personal history of other diseases of the circulatory system: Secondary | ICD-10-CM

## 2016-07-21 NOTE — Progress Notes (Signed)
[facility] OFFICE PROGRESS NOTE  Patient Care Team: Burnis Medin, MD as PCP - General [name], MD (Endocrinology) [name], MD (Internal Medicine) Everett Graff, MD as Attending Physician (Obstetrics and Gynecology)  SUMMARY OF ONCOLOGIC HISTORY:   CML (chronic myeloid leukemia) ([place])   05/15/2015 Bone Marrow Biopsy    BM biopsy and FISH confirmed CML [id number]      05/15/2015 - 05/20/2015 Chemotherapy    She started taking Hydrea 1500 mg daily      05/15/2015 Tumor Marker    BCR/ABL b2a2 87.5%, IS 49%      05/21/2015 -  Chemotherapy    She started taking Sprycel 100 mg daily      06/29/2015 Adverse Reaction    She had diarrhea. Dose of Srycel is reduced to 50 mg daily      09/10/2015 Tumor Marker    BCR/ABL b2a2 0.21%, IS 0.1743%      12/11/2015 Pathology Results    BCR/ABL b2a2 0.19%, IS 0.1577%      04/13/2016 Pathology Results    BCR/ABL b2a2 0.29%, IS 0.2407%      07/14/2016 Pathology Results    BCR/ABL b2a2 0.004%, IS 0.0092%. She is in MMR      07/21/2016 Miscellaneous    Dose of Sprycel is reduced to 50 mg daily       INTERVAL HISTORY: Please see below for problem oriented charting. She returns for follow-up. The patient developed recent subdural hematoma requiring craniotomy and evacuation. Aspirin was discontinued. She tolerated Sprycel well. No problems with increasing shortness of breath, fluid retention or diarrhea  REVIEW OF SYSTEMS:   Constitutional: Denies fevers, chills or abnormal weight loss Eyes: Denies blurriness of vision Ears, nose, mouth, throat, and face: Denies mucositis or sore throat Respiratory: Denies cough, dyspnea or wheezes Cardiovascular: Denies palpitation, chest discomfort or lower extremity swelling Gastrointestinal:  Denies nausea, heartburn or change in bowel habits Skin: Denies abnormal skin rashes Lymphatics: Denies new lymphadenopathy or easy bruising Neurological:Denies numbness,  tingling or new weaknesses Behavioral/Psych: Mood is stable, no new changes  All other systems were reviewed with the patient and are negative.  I have reviewed the past medical history, past surgical history, social history and family history with the patient and they are unchanged from previous note.  ALLERGIES:  has No Known Allergies.  MEDICATIONS:  Current Outpatient Prescriptions  Medication Sig Dispense Refill  . dasatinib (SPRYCEL) 100 MG tablet Take 1 tablet (100 mg total) by mouth daily. 90 tablet 1  . levETIRAcetam (KEPPRA) 500 MG tablet Take 1 tablet (500 mg total) by mouth 2 (two) times daily. (Patient taking differently: Take 750 mg by mouth 2 (two) times daily. ) 30 tablet 1  . lisinopril (PRINIVIL,ZESTRIL) 20 MG tablet Take 1 tablet (20 mg total) by mouth daily. 90 tablet 3  . SYNTHROID 150 MCG tablet Take 1 tablet (150 mcg total) by mouth daily before breakfast. 90 tablet 0  . verapamil (CALAN-SR) 240 MG CR tablet Take 1 tablet (240 mg total) by mouth at bedtime. 90 tablet 1  . zolpidem (AMBIEN) 10 MG tablet Take 10 mg by mouth at bedtime as needed for sleep.     No current facility-administered medications for this visit.     PHYSICAL EXAMINATION: ECOG PERFORMANCE STATUS: 1 - Symptomatic but completely ambulatory  Vitals:   07/21/16 1222  BP: (!) 119/57  Pulse: 72  Resp: 18  Temp: 97.6 F (36.4 C)   Filed Weights   07/21/16  1222  Weight: 176 lb (79.8 kg)    GENERAL:alert, no distress and comfortable SKIN: skin color, texture, turgor are normal, no rashes or significant lesions EYES: normal, Conjunctiva are pink and non-injected, sclera clear OROPHARYNX:no exudate, no erythema and lips, buccal mucosa, and tongue normal  NECK: supple, thyroid normal size, non-tender, without nodularity LYMPH:  no palpable lymphadenopathy in the cervical, axillary or inguinal LUNGS: clear to auscultation and percussion with normal breathing effort HEART: regular rate &  rhythm and no murmurs and no lower extremity edema ABDOMEN:abdomen soft, non-tender and normal bowel sounds Musculoskeletal:no cyanosis of digits and no clubbing  NEURO: alert & oriented x 3 with fluent speech, no focal motor/sensory deficits  LABORATORY DATA:  I have reviewed the data as listed    Component Value Date/Time   NA 139 07/14/2016 0854   K 4.3 07/14/2016 0854   CL 106 06/17/2016 1125   CO2 24 07/14/2016 0854   GLUCOSE 90 07/14/2016 0854   BUN 14.3 07/14/2016 0854   CREATININE 0.9 07/14/2016 0854   CALCIUM 9.4 07/14/2016 0854   PROT 7.3 07/14/2016 0854   ALBUMIN 3.4 (L) 07/14/2016 0854   AST 17 07/14/2016 0854   ALT 21 07/14/2016 0854   ALKPHOS 64 07/14/2016 0854   BILITOT 0.25 07/14/2016 0854   GFRNONAA >60 06/17/2016 1125   GFRAA >60 06/17/2016 1125    No results found for: SPEP, UPEP  Lab Results  Component Value Date   WBC 6.0 07/14/2016   NEUTROABS 3.5 07/14/2016   HGB 11.6 07/14/2016   HCT 34.7 (L) 07/14/2016   MCV 90.3 07/14/2016   PLT 355 07/14/2016      Chemistry      Component Value Date/Time   NA 139 07/14/2016 0854   K 4.3 07/14/2016 0854   CL 106 06/17/2016 1125   CO2 24 07/14/2016 0854   BUN 14.3 07/14/2016 0854   CREATININE 0.9 07/14/2016 0854      Component Value Date/Time   CALCIUM 9.4 07/14/2016 0854   ALKPHOS 64 07/14/2016 0854   AST 17 07/14/2016 0854   ALT 21 07/14/2016 0854   BILITOT 0.25 07/14/2016 0854       RADIOGRAPHIC STUDIES: I have personally reviewed the radiological images as listed and agreed with the findings in the report. Ct Head Wo Contrast  Result Date: 07/11/2016 CLINICAL DATA:  55 year old female status post right subdural drainage in November. Subsequent encounter. EXAM: CT HEAD WITHOUT CONTRAST TECHNIQUE: Contiguous axial images were obtained from the base of the skull through the vertex without intravenous contrast. COMPARISON:  06/28/2016 and earlier. FINDINGS: Brain: Residual mixed density right  extra-axial collection, stable to mildly since November. Residual mixed density blood products measure up to 17 mm in thickness (coronal image 32) but at most levels are 6-7 mm in thickness along the right hemisphere. Mildly improved patency of the right lateral ventricle. Stable the slightly decreased leftward midline shift, 3-4 mm. No increased intracranial mass effect. Basilar cisterns remain patent. Stable gray-white matter differentiation throughout the brain. No cortically based acute infarct identified. No ventriculomegaly. No new intracranial hemorrhage identified. Vascular: No suspicious intracranial vascular hyperdensity. Skull: Sequelae of right frontal craniotomy again noted. No acute osseous abnormality identified. Sinuses/Orbits: Visualized paranasal sinuses and mastoids are stable and well pneumatized. Other: Skin staples have been removed. No acute scalp or orbits soft tissue finding. IMPRESSION: 1. Stable to slightly reduced volume of residual mixed density mildly lobulated right subdural hematoma since 06/28/2016. Residual mass effect on the right hemisphere  with 3-4 mm of leftward midline shift. 2. No new intracranial abnormality. Electronically Signed   By: Genevie Emori M.D.   On: 07/11/2016 09:42   Ct Head Wo Contrast  Result Date: 06/28/2016 CLINICAL DATA:  55 year old female status post to recent surgery is for subdural hematoma. New onset extreme headache and left side neurologic deficits for 48 hours. Initial encounter. EXAM: CT HEAD WITHOUT CONTRAST TECHNIQUE: Contiguous axial images were obtained from the base of the skull through the vertex without intravenous contrast. COMPARISON:  Postoperative head CT 06/20/2016 and earlier. FINDINGS: Brain: Interval removal of the right subdural drain. Mixed density right subdural hematoma persists but appears not significantly changed in size or configuration since 06/20/2016. The residual hematoma measures up to 17 mm in thickness ( coronal image  40) but at most levels is 12-14 mm in thickness. Mass effect on the right hemisphere appears stable with leftward midline shift of 4 mm and partial effacement of the right lateral ventricle. No superimposed acute cortically based infarct or ventriculomegaly. No new areas of intracranial hemorrhage. Stable gray-white matter differentiation throughout the brain. Regressed pneumocephalus. Vascular: No asymmetric or suspicious intracranial vascular hyperdensity. Skull: Sequelae of right frontal craniotomy. Previous maxilla reconstruction. No new osseous abnormality identified. Sinuses/Orbits: Visualized paranasal sinuses and mastoids are stable and well pneumatized. Other: Postoperative changes along the right scalp. No new scalp soft tissue abnormality. Visualized orbit soft tissues are within normal limits. IMPRESSION: 1. Interval right subdural drain removal. Mixed density right subdural hematoma appears not significantly changed in size or configuration since 06/20/2016. 2. Stable mass effect on the right hemisphere with 4 mm of leftward midline shift and partial effacement of the right lateral ventricle. 3. No new intracranial abnormality. Electronically Signed   By: Genevie Daylani M.D.   On: 06/28/2016 13:52    ASSESSMENT & PLAN:  CML (chronic myeloid leukemia) ([place]) The patient has achieved major molecular response. I do not believe Sprycel is the cause of her hematoma. I recommend reducing the dose Sprycel to 50 mg daily and recheck blood work in 6 weeks. I will see her back in 3 months to repeat blood work as well.  History of subdural hematoma The cause is unknown. Aspirin has been discontinued. The patient has no residual neurological deficit or weakness. I would defer to neurosurgery for further management   Orders Placed This Encounter  Procedures  . CBC with Differential/Platelet    Standing Status:   Standing    Number of Occurrences:   22    Standing Expiration Date:   07/21/2017  .  BCR-ABL    With RT-PCR technique    Standing Status:   Standing    Number of Occurrences:   22    Standing Expiration Date:   07/21/2017   All questions were answered. The patient knows to call the clinic with any problems, questions or concerns. No barriers to learning was detected. I spent 15 minutes counseling the patient face to face. The total time spent in the appointment was 20 minutes and more than 50% was on counseling and review of test results     Heath Lark, MD 07/21/2016 1:30 PM

## 2016-07-21 NOTE — Telephone Encounter (Signed)
Appointments scheduled per 12/14 LOS. Patient given AVS report and calendars with future scheduled appointments.  °

## 2016-07-21 NOTE — Assessment & Plan Note (Signed)
The patient has achieved major molecular response. I do not believe Sprycel is the cause of her hematoma. I recommend reducing the dose Sprycel to 50 mg daily and recheck blood work in 6 weeks. I will see her back in 3 months to repeat blood work as well.

## 2016-07-21 NOTE — Assessment & Plan Note (Signed)
The cause is unknown. Aspirin has been discontinued. The patient has no residual neurological deficit or weakness. I would defer to neurosurgery for further management

## 2016-07-21 NOTE — Patient Outreach (Addendum)
[facility] Care Management  07/21/2016  Christina [name] 1961-07-24 [id number]  No response to patient outreach attempts, will proceed with case closure.   Objective:Per chart review: Patient hospitalized 06/17/16 - 07/01/16 for Right chronic subdural hematoma. Status post Repeat right craniotomy for evacuation of acute subdural hematoma on 06/18/16. Status post Right frontal craniotomy for evacuation of subdural hematoma on 06/17/16. Patient had ED visit on 06/12/16 for sore throat and left ear pain. Patient also has a history of MPN (myeloproliferative neoplasm), hypertension, and Graves disease.   Assessment: Received UMR Transition of care follow up referral on 06/21/16. Transition of care follow up not completed due to unable to contact and will proceed with case closure.   Plan: RNCM will send  case closure due to unable to contact request to [facility].       Christina [name] H. Annia Friendly, BSN, [place] Management [facility] Telephonic CM Phone: [phone] Fax: [phone]

## 2016-07-25 ENCOUNTER — Encounter: Payer: Self-pay | Admitting: Occupational Therapy

## 2016-07-25 ENCOUNTER — Other Ambulatory Visit: Payer: Self-pay | Admitting: Neurological Surgery

## 2016-07-25 ENCOUNTER — Ambulatory Visit: Payer: Managed Care, Other (non HMO) | Admitting: Occupational Therapy

## 2016-07-25 ENCOUNTER — Ambulatory Visit: Payer: Managed Care, Other (non HMO) | Admitting: Physical Therapy

## 2016-07-25 ENCOUNTER — Encounter: Payer: Self-pay | Admitting: Physical Therapy

## 2016-07-25 DIAGNOSIS — R2681 Unsteadiness on feet: Secondary | ICD-10-CM

## 2016-07-25 DIAGNOSIS — R2689 Other abnormalities of gait and mobility: Secondary | ICD-10-CM

## 2016-07-25 DIAGNOSIS — R482 Apraxia: Secondary | ICD-10-CM

## 2016-07-25 DIAGNOSIS — M6281 Muscle weakness (generalized): Secondary | ICD-10-CM

## 2016-07-25 DIAGNOSIS — S065XAA Traumatic subdural hemorrhage with loss of consciousness status unknown, initial encounter: Secondary | ICD-10-CM

## 2016-07-25 DIAGNOSIS — R41844 Frontal lobe and executive function deficit: Secondary | ICD-10-CM

## 2016-07-25 DIAGNOSIS — R278 Other lack of coordination: Secondary | ICD-10-CM

## 2016-07-25 DIAGNOSIS — R29818 Other symptoms and signs involving the nervous system: Secondary | ICD-10-CM

## 2016-07-25 DIAGNOSIS — I69254 Hemiplegia and hemiparesis following other nontraumatic intracranial hemorrhage affecting left non-dominant side: Secondary | ICD-10-CM

## 2016-07-25 DIAGNOSIS — S065X9A Traumatic subdural hemorrhage with loss of consciousness of unspecified duration, initial encounter: Secondary | ICD-10-CM

## 2016-07-25 DIAGNOSIS — R41842 Visuospatial deficit: Secondary | ICD-10-CM

## 2016-07-25 NOTE — Patient Instructions (Addendum)
Feet Together (Compliant Surface) Head Motion - Eyes Closed: STAND IN CORNER WITH CHAIR IN FRONT    Stand on compliant surface: _____pillow___ with feet together. Close eyes and move head slowly, up and down, side- to -side Repeat __10__ times per session. Do _1-2___ sessions per day.  Copyright  VHI. All rights reserved.

## 2016-07-25 NOTE — Therapy (Signed)
[facility] [address] [place], [place], [address] Phone: [phone]   Fax:  [phone]  Occupational Therapy Treatment  Patient Details  Name: Christina [name] MRN: [id number] Date of Birth: [birth year] Referring Provider: Sherley Bounds  Encounter Date: 07/25/2016      OT End of Session - 07/25/16 1408    Visit Number 6   Number of Visits 16   Date for OT Re-Evaluation 08/29/16   Authorization Type UMR no  visit limit   OT Start Time 1322   OT Stop Time 1401   OT Time Calculation (min) 39 min   Activity Tolerance Patient tolerated treatment well      Past Medical History:  Diagnosis Date  . Allergic rhinitis   . Fibroid   . Fibroids   . Grave's disease   . H/O Graves' disease   . H/O insomnia   . H/O menorrhagia 2012  . History of chicken pox   . Hypertension   . Hyperthyroidism    s/p rai 8/06 now hypothyroid Dr. Suzette Battiest  . Hypothyroid   . Infertility, female   . Leukemia ([place])   . Menorrhagia    Had Novasure  . 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 Polyp of cervix    Endometriel polyp. JO  . Vitamin D deficiency   . Vitamin D deficiency     Past Surgical History:  Procedure Laterality Date  . BUNIONECTOMY  1986  . CRANIOTOMY N/A 06/17/2016   Procedure: RIGHT CRANIOTOMY HEMATOMA EVACUATION SUBDURAL;  Surgeon: Eustace Moore, MD;  Location: [facility];  Service: Neurosurgery;  Laterality: N/A;  . CRANIOTOMY Right 06/18/2016   Procedure: CRANIOTOMY HEMATOMA EVACUATION SUBDURAL RIGHT;  Surgeon: Eustace Moore, MD;  Location: [facility];  Service: Neurosurgery;  Laterality: Right;  . DILATION AND CURETTAGE OF UTERUS  09/15/2010  . HYSTEROSCOPY W/D&C  09/28/2010  . MOUTH SURGERY    . NOVASURE ABLATION  09/28/2010  . REFRACTIVE SURGERY     for vision  . TONSILLECTOMY  1965    There were no vitals filed for this visit.      Subjective Assessment - 07/25/16 1323    Subjective  I was cleared  medically to drive so will call and make the appt for my driving eval.   Patient Stated Goals To use my hand better   Currently in Pain? No/denies                      OT Treatments/Exercises (OP) - 07/25/16 0001      ADLs   ADL Comments Discussed long term goals. Pt is completing house hold tasks such as laundry, vacuuming, dusting, changes beds, doing dishes mod I -pt reports she can do it all it just takes more time.  Pt is completing limited cooking - reports her husband did most of the cooking prior to this event so "I am happy to let him do it," Pt is capable of making simple hot familiar meal prep at this time.  Assessed grip strength which has improve to 50 pounds at this time - pt reports she has been religious about doing her putty exercises with green putty.  Pt also saw MD today who provided medical clearance to drive and pt will follow up with driving eval to ensure safety given higher level cognitive deficits as well as mild L neglect.  Also provided pt with name, address and telephone number for neuropsychologist for cognitive testing -  pt to follow up and states that she asked for and has order from MD in case she needs it.      Hand Exercises   Other Hand Exercises Addressed sustained grip strength with L hand using gripper on #2 to stack blocks of four. Pt needed mod vc's to use vision to compensate for sensory issues - pt with moderate dropping. Rated fatigue at 5/10 at end of activity.                    OT Short Term Goals - 07/25/16 1405      OT SHORT TERM GOAL #1   Title Pt and husband will be mod I with HEP - 08/01/2016   Status Achieved     OT SHORT TERM GOAL #2   Title Pt will be min a with dressing UB   Status Achieved     OT SHORT TERM GOAL #3   Title Pt will be mod a with dressing LB   Status Achieved     OT SHORT TERM GOAL #4   Title Pt will be mod I  for bathing   Status Achieved     OT SHORT TERM GOAL #5   Title Pt will  demonstate improved grip strength by at least 5 pounds (baseline= 25) to assist with functional tasks   Status Achieved  40 pounds     OT SHORT TERM GOAL #6   Title Pt will be mod I with organizing meds   Status Achieved     OT SHORT TERM GOAL #7   Title Pt will be mod I with tub transfers   Status Achieved     OT SHORT TERM GOAL #8   Title Pt will improve on Box and blocks by 5 blocks (baseline=14) with LUE as evidence of improved UE function   Status Achieved  34     OT SHORT TERM GOAL  #9   TITLE Pt will attend to L field with min vc's.   Status Achieved  per observation and pt and husband report           OT Long Term Goals - 07/25/16 1406      OT LONG TERM GOAL #1   Title Pt will be mod I with HEP - 08/29/2016   Status On-going     OT LONG TERM GOAL #2   Title Pt will be mod I with dressing   Status Achieved     OT LONG TERM GOAL #3   Title Pt will improve grip strength LUE to at least 45 pounds to assist with functional activities. (baseline= 25)   Status Achieved  50 pounds     OT LONG TERM GOAL #4   Title Pt will improve on Box and Blocks by at least 8 blocks as evidence of improved UE function   Status Achieved  34     OT LONG TERM GOAL #5   Title Pt will be mod I with simple familiar meal prep   Status Achieved     OT LONG TERM GOAL #6   Title Pt will demonstrate ability to lift 3 pound object overhead 5 times without dropping with LUE   Status Achieved  able to lift 6 pound object x5 with no dropping     OT LONG TERM GOAL #7   Title Pt will demonstate ability to carry plate of food from counter to table with LUE   Status Achieved     OT  LONG TERM GOAL #8   Title Pt will be able to complete 9 hole peg test in 2 minutes or less as demonstration of improved coordination   Status Achieved     OT LONG TERM GOAL  #9   Baseline Pt will be mod I with meds (including opening containers)   Status Achieved     OT LONG TERM GOAL  #10   TITLE Pt will  be mod I with simple laundry, clean up tasks in home.   Status Achieved               Plan - 07/25/16 1407    Clinical Impression Statement Pt making excellent progress toward goals. Pt remains with some numbness in L hand and weakness however is using L hand as non dominant in most tasks.    Rehab Potential Good   OT Frequency 2x / week   OT Duration 8 weeks   OT Treatment/Interventions Self-care/ADL training;Moist Heat;Electrical Stimulation;Fluidtherapy;DME and/or AE instruction;Energy conservation;Neuromuscular education;Therapeutic exercise;Functional Mobility Training;Manual Therapy;Passive range of motion;Splinting;Therapeutic activities;Patient/family education;Visual/perceptual remediation/compensation;Cognitive remediation/compensation   Plan upgrade HEP as possible, environmental scanning, kitchen activity to encourage LUE functional use and further assess cognition   Consulted and Agree with Plan of Care Patient      Patient will benefit from skilled therapeutic intervention in order to improve the following deficits and impairments:  Decreased activity tolerance, Decreased endurance, Decreased coordination, Decreased cognition, Decreased knowledge of use of DME, Decreased range of motion, Decreased strength, Impaired UE functional use, Impaired tone, Impaired sensation, Impaired vision/preception  Visit Diagnosis: Muscle weakness (generalized)  Other lack of coordination  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 History of subdural hematoma 07/21/2016  . S/P craniotomy 06/17/2016  . Pancytopenia due to antineoplastic chemotherapy ([place]) 06/30/2015  . Elevated diaphragm 06/30/2015  .  DOE (dyspnea on exertion) 06/30/2015  . Diarrhea due to drug 06/30/2015  . CML (chronic myeloid leukemia) ([place]) 05/18/2015  . Encounter for chemotherapy management 05/18/2015  . 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 Hyperuricemia 05/15/2015  . Essential hypertension 11/27/2014  . Shortness of breath 12/03/2013  . Hx of Graves' disease 12/03/2013  . Elevated blood pressure reading 12/03/2013  . Special screening for malignant neoplasms, colon 08/09/2013  . Insomnia 04/29/2012  . Medication management 04/29/2012  . Foot pain 04/29/2012  . Polyp of cervix   . Fibroids   . Vitamin D deficiency   . Cubital tunnel syndrome 09/05/2011  . TENDINITIS 12/19/2008  . HYPERTHYROIDISM 03/06/2007  . ARTHRALGIA 03/05/2007  . INSOMNIA 03/05/2007  . [place] DISEASE 03/02/2007  . LOSS, HEARING NOS 03/02/2007  . Allergic rhinitis 03/02/2007    Quay Burow, OTR/L 07/25/2016, 2:11 PM  [facility] [address] [place], [place], [address] Phone: [phone]   Fax:  [phone]  Name: TAUHEEDAH PACYNA MRN: [id number] Date of Birth: [birth year]

## 2016-07-25 NOTE — Therapy (Signed)
[facility] [address] [place], [place], [address] Phone: [phone]   Fax:  [phone]  Physical Therapy Treatment  Patient Details  Name: Christina [name] MRN: [id number] Date of Birth: November 27, 1960 Referring Provider: Sherley Bounds  Encounter Date: 07/25/2016      PT End of Session - 07/25/16 1433    Visit Number 7   Number of Visits 9   Date for PT Re-Evaluation 08/12/16   Authorization Type Private Insurance   PT Start Time 1400   PT Stop Time 1430   PT Time Calculation (min) 30 min   Equipment Utilized During Treatment Gait belt   Activity Tolerance Patient tolerated treatment well   Behavior During Therapy [facility] for tasks assessed/performed      Past Medical History:  Diagnosis Date  . Allergic rhinitis   . Fibroid   . Fibroids   . Grave's disease   . H/O Graves' disease   . H/O insomnia   . H/O menorrhagia 2012  . History of chicken pox   . Hypertension   . Hyperthyroidism    s/p rai 8/06 now hypothyroid Dr. Suzette Battiest  . Hypothyroid   . Infertility, female   . Leukemia ([place])   . Menorrhagia    Had Novasure  . 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 Polyp of cervix    Endometriel polyp. JO  . Vitamin D deficiency   . Vitamin D deficiency     Past Surgical History:  Procedure Laterality Date  . BUNIONECTOMY  1986  . CRANIOTOMY N/A 06/17/2016   Procedure: RIGHT CRANIOTOMY HEMATOMA EVACUATION SUBDURAL;  Surgeon: Eustace Moore, MD;  Location: [facility];  Service: Neurosurgery;  Laterality: N/A;  . CRANIOTOMY Right 06/18/2016   Procedure: CRANIOTOMY HEMATOMA EVACUATION SUBDURAL RIGHT;  Surgeon: Eustace Moore, MD;  Location: [facility];  Service: Neurosurgery;  Laterality: Right;  . DILATION AND CURETTAGE OF UTERUS  09/15/2010  . HYSTEROSCOPY W/D&C  09/28/2010  . MOUTH SURGERY    . NOVASURE ABLATION  09/28/2010  . REFRACTIVE SURGERY     for vision  . TONSILLECTOMY  1965    There were no vitals  filed for this visit.      Subjective Assessment - 07/25/16 1400    Subjective (P)  Pt shopped over the weekend and was tired.   Currently in Pain? (P)  No/denies                    NEUROMUSCULAR RE-EDUCATION:   Pt's composite SOT score was 49. Pt score WNL for somatosensory  but below normal limits with vision, vestibular, and preferential.          Balance Exercises - 07/25/16 1434      Balance Exercises: Standing   Standing Eyes Opened Narrow base of support (BOS);Head turns;Foam/compliant surface  no LOB   Standing Eyes Closed Narrow base of support (BOS);Head turns;Foam/compliant surface  required intermittent UE support           PT Education - 07/25/16 1432    Education provided Yes   Education Details Reviewed SOT results and updated HEP with vestibular exercises.   Person(s) Educated Patient   Methods Explanation;Demonstration;Verbal cues;Handout   Comprehension Verbalized understanding;Returned demonstration;Need further instruction;Verbal cues required             PT Long Term Goals - 07/04/16 1835      PT LONG TERM GOAL #1   Title Patient will demonstrate decreased fall risk with  DGI 19 or greater.   Time 4   Period Weeks   Status New     PT LONG TERM GOAL #2   Title Patient will ambulate indoor and outdoor surfaces 1000' independent no LOB including curb negotiation.    Time 4   Period Weeks   Status New     PT LONG TERM GOAL #3   Title Patient will be independent with HEP for balance and LE strength.    Time 4   Period Weeks   Status New     PT LONG TERM GOAL #4   Title Patient will tolerate 25 minutes continuous standing or walking activities without seated rest.    Time 4   Period Weeks   Status New     PT LONG TERM GOAL #5   Title Patient will demonstrate decreased fall risk with TUG cognitive 13.5 sec or less.   Time 4   Period Weeks   Status New               Plan - 07/25/16 1435    Clinical  Impression Statement Pt's composite SOT score was 49. Pt score WNL for somatosensory but below normal limits with vision, vestibular, and preferential. Updated HEP to include vestubular exercises, pt required UE support when standing on compliant surface, feet together, and eyes closed  with head turns.                                     Rehab Potential Good   PT Frequency 2x / week   PT Duration 4 weeks   PT Treatment/Interventions ADLs/Self Care Home Management;Patient/family education;Stair training;Functional mobility training;Therapeutic activities;Therapeutic exercise;Balance training;DME Instruction;Gait training;Vestibular   PT Next Visit Plan Review and add to HEP for vestibular system; progress on treadmill without UE support, continue high level balance and cognitive tasks   PT Home Exercise Plan Clamshell, SLR, Bridges, Balance at counter top   Consulted and Agree with Plan of Care Patient      Patient will benefit from skilled therapeutic intervention in order to improve the following deficits and impairments:  Abnormal gait, Decreased activity tolerance, Decreased balance, Decreased coordination, Decreased mobility, Decreased endurance, Decreased strength, Impaired perceived functional ability, Decreased safety awareness, Decreased cognition  Visit Diagnosis: Muscle weakness (generalized)  Unsteadiness on feet  Other abnormalities of gait and mobility     Problem List Patient Active Problem List   Diagnosis Date Noted  . History of subdural hematoma 07/21/2016  . S/P craniotomy 06/17/2016  . Pancytopenia due to antineoplastic chemotherapy ([place]) 06/30/2015  . Elevated diaphragm 06/30/2015  . DOE (dyspnea on exertion) 06/30/2015  . Diarrhea due to drug 06/30/2015  . CML (chronic myeloid leukemia) ([place]) 05/18/2015  . Encounter for chemotherapy management 05/18/2015  . 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 Hyperuricemia 05/15/2015  . Essential hypertension 11/27/2014  .  Shortness of breath 12/03/2013  . Hx of Graves' disease 12/03/2013  . Elevated blood pressure reading 12/03/2013  . Special screening for malignant neoplasms, colon 08/09/2013  . Insomnia 04/29/2012  . Medication management 04/29/2012  . Foot pain 04/29/2012  . Polyp of cervix   . Fibroids   . Vitamin D deficiency   . Cubital tunnel syndrome 09/05/2011  . TENDINITIS 12/19/2008  . HYPERTHYROIDISM 03/06/2007  . ARTHRALGIA 03/05/2007  . INSOMNIA 03/05/2007  . [place] DISEASE 03/02/2007  . LOSS, HEARING NOS 03/02/2007  . Allergic rhinitis 03/02/2007  Bjorn Loser, PTA  07/25/16, 2:42 PM [facility] [address] [place], [place], [address] Phone: [phone]   Fax:  [phone]  Name: Christina [name] MRN: [id number] Date of Birth: July 31, 1961

## 2016-07-26 ENCOUNTER — Ambulatory Visit: Payer: Managed Care, Other (non HMO) | Admitting: Occupational Therapy

## 2016-07-26 ENCOUNTER — Encounter: Payer: Self-pay | Admitting: Physical Therapy

## 2016-07-26 ENCOUNTER — Encounter: Payer: Self-pay | Admitting: Occupational Therapy

## 2016-07-26 ENCOUNTER — Ambulatory Visit: Payer: Managed Care, Other (non HMO) | Admitting: Physical Therapy

## 2016-07-26 DIAGNOSIS — R2681 Unsteadiness on feet: Secondary | ICD-10-CM

## 2016-07-26 DIAGNOSIS — R278 Other lack of coordination: Secondary | ICD-10-CM

## 2016-07-26 DIAGNOSIS — R482 Apraxia: Secondary | ICD-10-CM

## 2016-07-26 DIAGNOSIS — R41842 Visuospatial deficit: Secondary | ICD-10-CM

## 2016-07-26 DIAGNOSIS — I69254 Hemiplegia and hemiparesis following other nontraumatic intracranial hemorrhage affecting left non-dominant side: Secondary | ICD-10-CM

## 2016-07-26 DIAGNOSIS — M6281 Muscle weakness (generalized): Secondary | ICD-10-CM

## 2016-07-26 DIAGNOSIS — R2689 Other abnormalities of gait and mobility: Secondary | ICD-10-CM

## 2016-07-26 DIAGNOSIS — R41844 Frontal lobe and executive function deficit: Secondary | ICD-10-CM

## 2016-07-26 DIAGNOSIS — R29818 Other symptoms and signs involving the nervous system: Secondary | ICD-10-CM

## 2016-07-26 NOTE — Therapy (Signed)
[facility] [address] [place], [place], [address] Phone: [phone]   Fax:  [phone]  Physical Therapy Treatment  Patient Details  Name: Christina [name] MRN: [id number] Date of Birth: Sep 14, 1960 Referring Provider: Sherley Bounds  Encounter Date: 07/26/2016      PT End of Session - 07/26/16 1104    Visit Number 8   Number of Visits 9   Date for PT Re-Evaluation 08/12/16   Authorization Type Private Insurance   PT Start Time 1100   PT Stop Time 1140   PT Time Calculation (min) 40 min   Equipment Utilized During Treatment Gait belt   Activity Tolerance Patient tolerated treatment well   Behavior During Therapy [facility] for tasks assessed/performed      Past Medical History:  Diagnosis Date  . Allergic rhinitis   . Fibroid   . Fibroids   . Grave's disease   . H/O Graves' disease   . H/O insomnia   . H/O menorrhagia 2012  . History of chicken pox   . Hypertension   . Hyperthyroidism    s/p rai 8/06 now hypothyroid Dr. Suzette Battiest  . Hypothyroid   . Infertility, female   . Leukemia ([place])   . Menorrhagia    Had Novasure  . 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 Polyp of cervix    Endometriel polyp. JO  . Vitamin D deficiency   . Vitamin D deficiency     Past Surgical History:  Procedure Laterality Date  . BUNIONECTOMY  1986  . CRANIOTOMY N/A 06/17/2016   Procedure: RIGHT CRANIOTOMY HEMATOMA EVACUATION SUBDURAL;  Surgeon: Eustace Moore, MD;  Location: [facility];  Service: Neurosurgery;  Laterality: N/A;  . CRANIOTOMY Right 06/18/2016   Procedure: CRANIOTOMY HEMATOMA EVACUATION SUBDURAL RIGHT;  Surgeon: Eustace Moore, MD;  Location: [facility];  Service: Neurosurgery;  Laterality: Right;  . DILATION AND CURETTAGE OF UTERUS  09/15/2010  . HYSTEROSCOPY W/D&C  09/28/2010  . MOUTH SURGERY    . NOVASURE ABLATION  09/28/2010  . REFRACTIVE SURGERY     for vision  . TONSILLECTOMY  1965    There were no vitals  filed for this visit.      Subjective Assessment - 07/26/16 1104    Subjective No new complaints. No falls to report. No pain to report.   Patient is accompained by: Family member   Pertinent History Spouse reports having to help pt get dressed, dried off.     Limitations Other (comment)             [facility] Adult PT Treatment/Exercise - 07/26/16 1131      Ambulation/Gait   Ambulation/Gait Yes   Ambulation/Gait Assistance 5: Supervision;4: Min guard   Assistive device None   Ambulation Surface Level;Indoor   Gait Comments gait along ~50 foot hallway: forward gait with head movement up<>down, left<>right and diagonals x 4 laps with min guard assist. decreased gait speed noted with head movements and mild veering noted with left<>right and diagonal head movements.                                         Balance Exercises - 07/26/16 1108      Balance Exercises: Standing   Standing Eyes Closed Wide (BOA);Narrow base of support (BOS);Head turns;Foam/compliant surface;Other reps (comment);Limitations   Rockerboard Anterior/posterior;Lateral;EO;EC;Head turns;20 seconds;10 reps     Balance  Exercises: Standing   Standing Eyes Closed Limitations in corner on 2 pillows: feet apart progressing to feet together- EC no head movemetns, EO head movements up<>down, left<>right and diagonals, progressing to EC with all head movements. min guard to min assist for balance, increased assistance needed with feet together with EC.                                      Rebounder Limitations performed both ways on balance board with no UE support: holding board steady with EC no head movements, EO head movements up<>down and left<>right progressing to EC head movements up<>down and left<>right, min to mod assist for balance.                                           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   Person(s) Educated Patient   Methods Explanation;Demonstration   Comprehension Verbalized understanding;Returned demonstration             PT Long Term Goals - 07/04/16 1835      PT LONG TERM GOAL #1   Title Patient will demonstrate decreased fall risk with DGI 19 or greater.   Time 4   Period Weeks   Status New     PT LONG TERM GOAL #2   Title Patient will ambulate indoor and outdoor surfaces 1000' independent no LOB including curb negotiation.    Time 4   Period Weeks   Status New     PT LONG TERM GOAL #3   Title Patient will be independent with HEP for balance and LE strength.    Time 4   Period Weeks   Status New     PT LONG TERM GOAL #4   Title Patient will tolerate 25 minutes continuous standing or walking activities without seated rest.    Time 4   Period Weeks   Status New     PT LONG TERM GOAL #5   Title Patient will demonstrate decreased fall risk with TUG cognitive 13.5 sec or less.   Time 4   Period Weeks   Status New               Plan - 07/26/16 1105    Clinical Impression Statement Today's skilled session focused on balance with emphasis on vestibular and visual imput. Pt continues to be challenged on complaint surfaces, especially with EC. Pt is making progress toward goals, and should benefit from continued PT to progress toward unmet goals.   Rehab Potential Good   PT Frequency 2x / week   PT Duration 4 weeks   PT Treatment/Interventions ADLs/Self Care Home Management;Patient/family education;Stair training;Functional mobility training;Therapeutic activities;Therapeutic exercise;Balance training;DME Instruction;Gait training;Vestibular   PT Next Visit Plan Review and add to HEP for vestibular system; progress on treadmill without UE support, continue high level balance and cognitive tasks   PT Home  Exercise Plan Clamshell, SLR, Bridges, Balance at counter top   Consulted and Agree with Plan of Care Patient      Patient will benefit from skilled therapeutic intervention in order to  improve the following deficits and impairments:  Abnormal gait, Decreased activity tolerance, Decreased balance, Decreased coordination, Decreased mobility, Decreased endurance, Decreased strength, Impaired perceived functional ability, Decreased safety awareness, Decreased cognition  Visit Diagnosis: Muscle weakness (generalized)  Unsteadiness on feet  Other abnormalities of gait and mobility     Problem List Patient Active Problem List   Diagnosis Date Noted  . History of subdural hematoma 07/21/2016  . S/P craniotomy 06/17/2016  . Pancytopenia due to antineoplastic chemotherapy ([place]) 06/30/2015  . Elevated diaphragm 06/30/2015  . DOE (dyspnea on exertion) 06/30/2015  . Diarrhea due to drug 06/30/2015  . CML (chronic myeloid leukemia) ([place]) 05/18/2015  . Encounter for chemotherapy management 05/18/2015  . 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 Hyperuricemia 05/15/2015  . Essential hypertension 11/27/2014  . Shortness of breath 12/03/2013  . Hx of Graves' disease 12/03/2013  . Elevated blood pressure reading 12/03/2013  . Special screening for malignant neoplasms, colon 08/09/2013  . Insomnia 04/29/2012  . Medication management 04/29/2012  . Foot pain 04/29/2012  . Polyp of cervix   . Fibroids   . Vitamin D deficiency   . Cubital tunnel syndrome 09/05/2011  . TENDINITIS 12/19/2008  . HYPERTHYROIDISM 03/06/2007  . ARTHRALGIA 03/05/2007  . INSOMNIA 03/05/2007  . [place] DISEASE 03/02/2007  . LOSS, HEARING NOS 03/02/2007  . Allergic rhinitis 03/02/2007    Willow Ora, PTA, [facility] Outpatient Neuro [facility] [address] [place], [place] [address] [phone] 07/26/16, 10:06 PM   Name: Christina [name] MRN: [id number] Date of Birth: [birth year]

## 2016-07-26 NOTE — Therapy (Signed)
[facility] [address] [place], [place], [address] Phone: [phone]   Fax:  [phone]  Occupational Therapy Treatment  Patient Details  Name: Christina [name] MRN: [id number] Date of Birth: Aug 14, 1960 Referring Provider: Sherley Bounds  Encounter Date: 07/26/2016      OT End of Session - 07/26/16 1303    Visit Number 7   Number of Visits 16   Date for OT Re-Evaluation 08/29/16   Authorization Type UMR no  visit limit   OT Start Time 1017   OT Stop Time 1059   OT Time Calculation (min) 42 min   Activity Tolerance Patient tolerated treatment well      Past Medical History:  Diagnosis Date  . Allergic rhinitis   . Fibroid   . Fibroids   . Grave's disease   . H/O Graves' disease   . H/O insomnia   . H/O menorrhagia 2012  . History of chicken pox   . Hypertension   . Hyperthyroidism    s/p rai 8/06 now hypothyroid Dr. Suzette Battiest  . Hypothyroid   . Infertility, female   . Leukemia ([place])   . Menorrhagia    Had Novasure  . 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 Polyp of cervix    Endometriel polyp. JO  . Vitamin D deficiency   . Vitamin D deficiency     Past Surgical History:  Procedure Laterality Date  . BUNIONECTOMY  1986  . CRANIOTOMY N/A 06/17/2016   Procedure: RIGHT CRANIOTOMY HEMATOMA EVACUATION SUBDURAL;  Surgeon: Eustace Moore, MD;  Location: [facility];  Service: Neurosurgery;  Laterality: N/A;  . CRANIOTOMY Right 06/18/2016   Procedure: CRANIOTOMY HEMATOMA EVACUATION SUBDURAL RIGHT;  Surgeon: Eustace Moore, MD;  Location: [facility];  Service: Neurosurgery;  Laterality: Right;  . DILATION AND CURETTAGE OF UTERUS  09/15/2010  . HYSTEROSCOPY W/D&C  09/28/2010  . MOUTH SURGERY    . NOVASURE ABLATION  09/28/2010  . REFRACTIVE SURGERY     for vision  . TONSILLECTOMY  1965    There were no vitals filed for this visit.      Subjective Assessment - 07/26/16 1025    Subjective  I am on wait list  for the neuropsych testing   Patient is accompained by: Family member  dtr   Patient Stated Goals To use my hand better   Currently in Pain? No/denies                      OT Treatments/Exercises (OP) - 07/26/16 0001      ADLs   ADL Comments Pt has followed through with making appt for driving eval as well as neuropsych testing.  Pt reports that she is on wait list for neuropsych. Provided additional referral at another practice and pt to follow up with them.  Discussed potential return to work in around January 22nd - discussed returning in part time capacity and ramping up to full time. Pt to follow up with her supervisor.  Reassessed 9 hold peg test and box and blocks - see goal section for details.      Hand Exercises   Other Hand Exercises Addresed grip strength, in hand coordination, motor planning, and functional use of hand.  Pt performs better with automatic familiar functional tasks due to motor plan deficits.              Balance Exercises - 07/26/16 1108      Balance Exercises: Standing  Standing Eyes Closed Wide (BOA);Narrow base of support (BOS);Head turns;Foam/compliant surface;Other reps (comment);Limitations   Rockerboard Anterior/posterior;Lateral;EO;EC;Head turns;20 seconds;10 reps             OT Short Term Goals - 07/26/16 1300      OT SHORT TERM GOAL #1   Title Pt and husband will be mod I with HEP - 08/01/2016   Status Achieved     OT SHORT TERM GOAL #2   Title Pt will be min a with dressing UB   Status Achieved     OT SHORT TERM GOAL #3   Title Pt will be mod a with dressing LB   Status Achieved     OT SHORT TERM GOAL #4   Title Pt will be mod I  for bathing   Status Achieved     OT SHORT TERM GOAL #5   Title Pt will demonstate improved grip strength by at least 5 pounds (baseline= 25) to assist with functional tasks   Status Achieved  40 pounds     OT SHORT TERM GOAL #6   Title Pt will be mod I with organizing meds    Status Achieved     OT SHORT TERM GOAL #7   Title Pt will be mod I with tub transfers   Status Achieved     OT SHORT TERM GOAL #8   Title Pt will improve on Box and blocks by 5 blocks (baseline=14) with LUE as evidence of improved UE function   Status Achieved  34     OT SHORT TERM GOAL  #9   TITLE Pt will attend to L field with min vc's.   Status Achieved  per observation and pt and husband report           OT Long Term Goals - 07/26/16 1300      OT LONG TERM GOAL #1   Title Pt will be mod I with HEP - 08/29/2016   Status On-going     OT LONG TERM GOAL #2   Title Pt will be mod I with dressing   Status Achieved     OT LONG TERM GOAL #3   Title Pt will improve grip strength LUE to at least 45 pounds to assist with functional activities. (baseline= 25)   Status Achieved  50 pounds     OT LONG TERM GOAL #4   Title Pt will improve on Box and Blocks by at least 8 blocks as evidence of improved UE function   Status Achieved  51     OT LONG TERM GOAL #5   Title Pt will be mod I with simple familiar meal prep   Status Achieved     OT LONG TERM GOAL #6   Title Pt will demonstrate ability to lift 3 pound object overhead 5 times without dropping with LUE   Status Achieved  able to lift 6 pound object x5 with no dropping     OT LONG TERM GOAL #7   Title Pt will demonstate ability to carry plate of food from counter to table with LUE   Status Achieved     OT LONG TERM GOAL #8   Title Pt will be able to complete 9 hole peg test in 2 minutes or less as demonstration of improved coordination   Status Achieved  51.7 seconds     OT LONG TERM GOAL  #9   Baseline Pt will be mod I with meds (including opening containers)   Status  Achieved     OT LONG TERM GOAL  #10   TITLE Pt will be mod I with simple laundry, clean up tasks in home.   Status Achieved               Plan - 07/26/16 1301    Clinical Impression Statement Pt contiues to make good progress toward  goals. Pt is extremely motivated to return to work and follows through well with recommendations.    Rehab Potential Good   OT Frequency 2x / week   OT Duration 8 weeks   OT Treatment/Interventions Self-care/ADL training;Moist Heat;Electrical Stimulation;Fluidtherapy;DME and/or AE instruction;Energy conservation;Neuromuscular education;Therapeutic exercise;Functional Mobility Training;Manual Therapy;Passive range of motion;Splinting;Therapeutic activities;Patient/family education;Visual/perceptual remediation/compensation;Cognitive remediation/compensation   Plan grip strength, fine motor, functional use of L hand, motor planning   Consulted and Agree with Plan of Care Patient;Family member/caregiver   Family Member Consulted dtr, Engineer, maintenance (IT)      Patient will benefit from skilled therapeutic intervention in order to improve the following deficits and impairments:  Decreased activity tolerance, Decreased endurance, Decreased coordination, Decreased cognition, Decreased knowledge of use of DME, Decreased range of motion, Decreased strength, Impaired UE functional use, Impaired tone, Impaired sensation, Impaired vision/preception  Visit Diagnosis: Muscle weakness (generalized)  Unsteadiness on feet  Other lack of coordination  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 History of subdural hematoma 07/21/2016  . S/P craniotomy 06/17/2016  . Pancytopenia due to antineoplastic chemotherapy ([place]) 06/30/2015  . Elevated diaphragm 06/30/2015  . DOE (dyspnea on exertion) 06/30/2015  . Diarrhea due to drug 06/30/2015  . CML (chronic myeloid leukemia) ([place]) 05/18/2015  . Encounter for chemotherapy management 05/18/2015   . 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 Hyperuricemia 05/15/2015  . Essential hypertension 11/27/2014  . Shortness of breath 12/03/2013  . Hx of Graves' disease 12/03/2013  . Elevated blood pressure reading 12/03/2013  . Special screening for malignant neoplasms, colon 08/09/2013  . Insomnia 04/29/2012  . Medication management 04/29/2012  . Foot pain 04/29/2012  . Polyp of cervix   . Fibroids   . Vitamin D deficiency   . Cubital tunnel syndrome 09/05/2011  . TENDINITIS 12/19/2008  . HYPERTHYROIDISM 03/06/2007  . ARTHRALGIA 03/05/2007  . INSOMNIA 03/05/2007  . [place] DISEASE 03/02/2007  . LOSS, HEARING NOS 03/02/2007  . Allergic rhinitis 03/02/2007    Quay Burow, OTR/L 07/26/2016, 1:04 PM  [facility] [address] [place], [place], [address] Phone: [phone]   Fax:  [phone]  Name: KYNNLEIGH KURZAWA MRN: [id number] Date of Birth: [birth year]

## 2016-07-27 MED FILL — levETIRAcetam 500 MG TABS: 500 | 30 days supply | Qty: 60 | Fill #0

## 2016-08-02 ENCOUNTER — Ambulatory Visit: Payer: Managed Care, Other (non HMO) | Admitting: Occupational Therapy

## 2016-08-02 ENCOUNTER — Other Ambulatory Visit: Payer: Self-pay | Admitting: Family Medicine

## 2016-08-02 ENCOUNTER — Ambulatory Visit: Payer: Managed Care, Other (non HMO)

## 2016-08-02 ENCOUNTER — Encounter: Payer: Self-pay | Admitting: Occupational Therapy

## 2016-08-02 DIAGNOSIS — M6281 Muscle weakness (generalized): Secondary | ICD-10-CM

## 2016-08-02 DIAGNOSIS — R2681 Unsteadiness on feet: Secondary | ICD-10-CM

## 2016-08-02 DIAGNOSIS — I69254 Hemiplegia and hemiparesis following other nontraumatic intracranial hemorrhage affecting left non-dominant side: Secondary | ICD-10-CM

## 2016-08-02 DIAGNOSIS — R41842 Visuospatial deficit: Secondary | ICD-10-CM

## 2016-08-02 DIAGNOSIS — R29818 Other symptoms and signs involving the nervous system: Secondary | ICD-10-CM

## 2016-08-02 DIAGNOSIS — R41844 Frontal lobe and executive function deficit: Secondary | ICD-10-CM

## 2016-08-02 DIAGNOSIS — R2689 Other abnormalities of gait and mobility: Secondary | ICD-10-CM

## 2016-08-02 DIAGNOSIS — R482 Apraxia: Secondary | ICD-10-CM

## 2016-08-02 DIAGNOSIS — R278 Other lack of coordination: Secondary | ICD-10-CM

## 2016-08-02 MED ORDER — VERAPAMIL HCL ER 240 MG PO TBCR: 240.0000 mg | EXTENDED_RELEASE_TABLET | Freq: Every day | ORAL | 1 refills | Status: DC

## 2016-08-02 MED FILL — VERAPAMIL ER 240 MG TABLET: 240 | 90 days supply | Qty: 90 | Fill #0

## 2016-08-02 NOTE — Therapy (Signed)
[facility] [address] [place], [place], [address] Phone: [phone]   Fax:  [phone]  Occupational Therapy Treatment  Patient Details  Name: Christina [name] MRN: [id number] Date of Birth: October 15, 1960 Referring Provider: Sherley Bounds  Encounter Date: 08/02/2016      OT End of Session - 08/02/16 1531    Visit Number 8   Number of Visits 16   Date for OT Re-Evaluation 08/29/16   Authorization Type UMR no  visit limit   OT Start Time 1318   OT Stop Time 1403   OT Time Calculation (min) 45 min   Activity Tolerance --      Past Medical History:  Diagnosis Date  . Allergic rhinitis   . Fibroid   . Fibroids   . Grave's disease   . H/O Graves' disease   . H/O insomnia   . H/O menorrhagia 2012  . History of chicken pox   . Hypertension   . Hyperthyroidism    s/p rai 8/06 now hypothyroid Dr. Suzette Battiest  . Hypothyroid   . Infertility, female   . Leukemia ([place])   . Menorrhagia    Had Novasure  . 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 Polyp of cervix    Endometriel polyp. JO  . Vitamin D deficiency   . Vitamin D deficiency     Past Surgical History:  Procedure Laterality Date  . BUNIONECTOMY  1986  . CRANIOTOMY N/A 06/17/2016   Procedure: RIGHT CRANIOTOMY HEMATOMA EVACUATION SUBDURAL;  Surgeon: Eustace Moore, MD;  Location: [facility];  Service: Neurosurgery;  Laterality: N/A;  . CRANIOTOMY Right 06/18/2016   Procedure: CRANIOTOMY HEMATOMA EVACUATION SUBDURAL RIGHT;  Surgeon: Eustace Moore, MD;  Location: [facility];  Service: Neurosurgery;  Laterality: Right;  . DILATION AND CURETTAGE OF UTERUS  09/15/2010  . HYSTEROSCOPY W/D&C  09/28/2010  . MOUTH SURGERY    . NOVASURE ABLATION  09/28/2010  . REFRACTIVE SURGERY     for vision  . TONSILLECTOMY  1965    There were no vitals filed for this visit.      Subjective Assessment - 08/02/16 1320    Subjective  The holidays have been hectic and I get tired  easily   Patient Stated Goals To use my hand better   Currently in Pain? No/denies                      OT Treatments/Exercises (OP) - 08/02/16 0001      ADLs   ADL Comments Pt has not yet scheduled appt for neuropsych testing but states she will do it this week.       Exercises   Exercises Hand;Shoulder     Shoulder Exercises: Seated   Other Seated Exercises Pt instructed in and issued red theraband exercises for LUE for proximal strength.  Pt given exercises in wriiting and able to return demonstrate all exercises.      Hand Exercises   Theraputty - Locate Pegs Locate 18 pegs in green putty - pt able to complete in less time and with less difficulty.    Hand Gripper with Small Beads Gripper on #3 to pick up 1 inch blocks with min dropping.  Pt had to take 3 rest breaks but was able to do on #3 for first time.                 OT Education - 08/02/16 1529    Education provided Yes  Education Details theraband for LUE (red)   Person(s) Educated Patient   Methods Explanation;Demonstration;Verbal cues;Handout   Comprehension Verbalized understanding;Returned demonstration          OT Short Term Goals - 08/02/16 1530      OT SHORT TERM GOAL #1   Title Pt and husband will be mod I with HEP - 08/01/2016   Status Achieved     OT SHORT TERM GOAL #2   Title Pt will be min a with dressing UB   Status Achieved     OT SHORT TERM GOAL #3   Title Pt will be mod a with dressing LB   Status Achieved     OT SHORT TERM GOAL #4   Title Pt will be mod I  for bathing   Status Achieved     OT SHORT TERM GOAL #5   Title Pt will demonstate improved grip strength by at least 5 pounds (baseline= 25) to assist with functional tasks   Status Achieved  40 pounds     OT SHORT TERM GOAL #6   Title Pt will be mod I with organizing meds   Status Achieved     OT SHORT TERM GOAL #7   Title Pt will be mod I with tub transfers   Status Achieved     OT SHORT TERM  GOAL #8   Title Pt will improve on Box and blocks by 5 blocks (baseline=14) with LUE as evidence of improved UE function   Status Achieved  34     OT SHORT TERM GOAL  #9   TITLE Pt will attend to L field with min vc's.   Status Achieved  per observation and pt and husband report           OT Long Term Goals - 08/02/16 1345      OT LONG TERM GOAL #1   Title Pt will be mod I with HEP - 08/29/2016   Status On-going     OT LONG TERM GOAL #2   Title Pt will be mod I with dressing   Status Achieved     OT LONG TERM GOAL #3   Title Pt will improve grip strength LUE to at least 45 pounds to assist with functional activities. (baseline= 25)   Status Achieved  50 pounds     OT LONG TERM GOAL #4   Title Pt will improve on Box and Blocks by at least 8 blocks as evidence of improved UE function   Status Achieved  51     OT LONG TERM GOAL #5   Title Pt will be mod I with simple familiar meal prep   Status Achieved     OT LONG TERM GOAL #6   Title Pt will demonstrate ability to lift 3 pound object overhead 5 times without dropping with LUE   Status Achieved  able to lift 6 pound object x5 with no dropping     OT LONG TERM GOAL #7   Title Pt will demonstate ability to carry plate of food from counter to table with LUE   Status Achieved     OT LONG TERM GOAL #8   Title Pt will be able to complete 9 hole peg test in 2 minutes or less as demonstration of improved coordination   Status Achieved  51.7 seconds     OT LONG TERM GOAL  #9   Baseline Pt will be mod I with meds (including opening containers)   Status Achieved  OT LONG TERM GOAL  #10   TITLE Pt will be mod I with simple laundry, clean up tasks in home.   Status Achieved               Plan - 08/02/16 1530    Clinical Impression Statement Pt progressing nicely toward goals.  Pt given upgraded HEP today. Pt states she feels like she has sigificant fatigue with activity.   Rehab Potential Good   OT  Frequency 2x / week   OT Duration 8 weeks   OT Treatment/Interventions Self-care/ADL training;Moist Heat;Electrical Stimulation;Fluidtherapy;DME and/or AE instruction;Energy conservation;Neuromuscular education;Therapeutic exercise;Functional Mobility Training;Manual Therapy;Passive range of motion;Splinting;Therapeutic activities;Patient/family education;Visual/perceptual remediation/compensation;Cognitive remediation/compensation   Plan place pt on hold to allow pt to work on LUE strength, functional use and activity tolerance at home. Pt to return in 2 weeks.    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 History of subdural hematoma 07/21/2016  . S/P craniotomy 06/17/2016  . Pancytopenia due to antineoplastic chemotherapy ([place]) 06/30/2015  . Elevated diaphragm 06/30/2015  . DOE (dyspnea on exertion) 06/30/2015  . Diarrhea due to drug 06/30/2015  . 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 Encounter for chemotherapy management 05/18/2015  . 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 Hyperuricemia 05/15/2015  . Essential hypertension 11/27/2014  . Shortness of breath  12/03/2013  . Hx of Graves' disease 12/03/2013  . Elevated blood pressure reading 12/03/2013  . Special screening for malignant neoplasms, colon 08/09/2013  . Insomnia 04/29/2012  . Medication management 04/29/2012  . Foot pain 04/29/2012  . Polyp of cervix   . Fibroids   . Vitamin D deficiency   . Cubital tunnel syndrome 09/05/2011  . TENDINITIS 12/19/2008  . HYPERTHYROIDISM 03/06/2007  . ARTHRALGIA 03/05/2007  . INSOMNIA 03/05/2007  . [place] DISEASE 03/02/2007  . LOSS, HEARING NOS 03/02/2007  . Allergic rhinitis 03/02/2007    Quay Burow, OTR/L 08/02/2016, 3:33 PM  [facility] [address] [place], [place], [address] Phone: [phone]   Fax:  [phone]  Name: Christina [name] MRN: [id number] Date of Birth: August 29, 1960

## 2016-08-02 NOTE — Patient Instructions (Signed)
Theraband (red) exercises for proximal strength for LUE

## 2016-08-02 NOTE — Patient Instructions (Signed)
Walking Program:  Begin walking for exercise for 30 minutes, 1 times/day, 5 days/week.   Progress your walking program by adding 5 minutes to your routine each week, as tolerated. Be sure to wear good walking shoes, walk in a safe environment and only progress to your tolerance.        Begin balance activities with support from hand on counter and begin to progress with out support as you become comfortable with exercises.  Bridge    Lie back, legs bent. Inhale, pressing hips up. Keeping ribs in, lengthen lower back. Exhale, rolling down along spine from top. Repeat __10__ times. Do __3__ sets per session. 2-3 times a week.  http://pm.exer.us/55   Copyright  VHI. All rights reserved.   Copyright  VHI. All rights reserved.  Braiding    Move to side: 1) cross right leg in front of left, 2) bring back leg out to side, then 3) cross right leg behind left, 4) bring left leg out to side. Continue sequence in same direction. Reverse sequence, moving in opposite direction. Repeat sequence __3__ laps session. Do __1__ sessions per day.  Side Stepping With Semi-Squat    Perform at counter and use hand support as needed. Step to side, while performing semi-squat position keeping heels on ground. Perform along 10 feet of counter.  Perform _2__ laps side stepping right. Perform _2__ laps side stepping left. Perform 2-3 times a week.   Copyright  VHI. All rights reserved.   Feet Together (Compliant Surface) Head Motion - Eyes Closed: STAND IN CORNER WITH CHAIR IN FRONT    Stand on compliant surface: _____pillow___ with feet together. Close eyes and move head slowly, up and down, side- to -side Repeat __10__ times per session. Do _1-2___ sessions per day.  Copyright  VHI. All rights reserved.

## 2016-08-02 NOTE — Therapy (Signed)
[facility] [address] [place], [place], [address] Phone: [phone]   Fax:  [phone]  Physical Therapy Treatment  Patient Details  Name: Christina [name] MRN: [id number] Date of Birth: 1960-10-18 Referring Provider: Sherley Bounds  Encounter Date: 08/02/2016      PT End of Session - 08/02/16 1440    Visit Number 9   Number of Visits 9   Date for PT Re-Evaluation 08/12/16   Authorization Type Private Insurance   PT Start Time 6962  pt with OT   PT Stop Time 1435   PT Time Calculation (min) 31 min   Equipment Utilized During Treatment --  S prn   Activity Tolerance Patient tolerated treatment well   Behavior During Therapy [facility] for tasks assessed/performed      Past Medical History:  Diagnosis Date  . Allergic rhinitis   . Fibroid   . Fibroids   . Grave's disease   . H/O Graves' disease   . H/O insomnia   . H/O menorrhagia 2012  . History of chicken pox   . Hypertension   . Hyperthyroidism    s/p rai 8/06 now hypothyroid Dr. Suzette Battiest  . Hypothyroid   . Infertility, female   . Leukemia ([place])   . Menorrhagia    Had Novasure  . 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 Polyp of cervix    Endometriel polyp. JO  . Vitamin D deficiency   . Vitamin D deficiency     Past Surgical History:  Procedure Laterality Date  . BUNIONECTOMY  1986  . CRANIOTOMY N/A 06/17/2016   Procedure: RIGHT CRANIOTOMY HEMATOMA EVACUATION SUBDURAL;  Surgeon: Eustace Moore, MD;  Location: [facility];  Service: Neurosurgery;  Laterality: N/A;  . CRANIOTOMY Right 06/18/2016   Procedure: CRANIOTOMY HEMATOMA EVACUATION SUBDURAL RIGHT;  Surgeon: Eustace Moore, MD;  Location: [facility];  Service: Neurosurgery;  Laterality: Right;  . DILATION AND CURETTAGE OF UTERUS  09/15/2010  . HYSTEROSCOPY W/D&C  09/28/2010  . MOUTH SURGERY    . NOVASURE ABLATION  09/28/2010  . REFRACTIVE SURGERY     for vision  . TONSILLECTOMY  1965    There  were no vitals filed for this visit.      Subjective Assessment - 08/02/16 1408    Subjective Pt denied falls or chnages since last visit.    Patient is accompained by: Family member   Pertinent History Spouse reports having to help pt get dressed, dried off.     Currently in Pain? No/denies         Therex: Pt performed strengthening HEP, and PT modified as indicated. Cues for technique during new exercises. All activities performed with S to ensure safety. Please see pt instructions for details.  Neuro re-ed: Pt performed HEP IND with no cues required. Please see pt instructions for details.                 [facility] Adult PT Treatment/Exercise - 08/02/16 1411      Ambulation/Gait   Ambulation/Gait Yes   Ambulation/Gait Assistance 7: Independent   Ambulation/Gait Assistance Details No LOB    Ambulation Distance (Feet) 1000 Feet   Assistive device None   Gait Pattern Step-through pattern;Within Functional Limits   Ambulation Surface Level;Unlevel;Indoor;Outdoor;Paved   Ramp 7: Independent   Curb 7: Independent     Standardized Balance Assessment   Standardized Balance Assessment Dynamic Gait Index;Timed Up and Go Test     Dynamic Gait Index  Level Surface Normal   Change in Gait Speed Normal   Gait with Horizontal Head Turns Normal   Gait with Vertical Head Turns Normal   Gait and Pivot Turn Normal   Step Over Obstacle Normal   Step Around Obstacles Normal   Steps Normal   Total Score 24     Timed Up and Go Test   TUG Normal TUG;Cognitive TUG   Normal TUG (seconds) 7   Cognitive TUG (seconds) 7.2                PT Education - 08/02/16 1439    Education provided Yes   Education Details PT discussed goal, progress, and d/c. Pt agreeable. PT added walking program and modified HEP as indicated.    Person(s) Educated Patient   Methods Explanation;Demonstration;Verbal cues;Handout   Comprehension Returned demonstration;Verbalized understanding              PT Long Term Goals - 08/02/16 1441      PT LONG TERM GOAL #1   Title Patient will demonstrate decreased fall risk with DGI 19 or greater.   Time 4   Period Weeks   Status Achieved     PT LONG TERM GOAL #2   Title Patient will ambulate indoor and outdoor surfaces 1000' independent no LOB including curb negotiation.    Time 4   Period Weeks   Status Achieved     PT LONG TERM GOAL #3   Title Patient will be independent with HEP for balance and LE strength.    Time 4   Period Weeks   Status Achieved     PT LONG TERM GOAL #4   Title Patient will tolerate 25 minutes continuous standing or walking activities without seated rest.    Time 4   Period Weeks   Status Achieved     PT LONG TERM GOAL #5   Title Patient will demonstrate decreased fall risk with TUG cognitive 13.5 sec or less.   Time 4   Period Weeks   Status Achieved               Plan - 08/02/16 1440    Clinical Impression Statement Pt met all LTGs, and is therefore d/c based on progress and pt being pleased with functional abilities. Please see pt d/c summary for details.    Rehab Potential Good   PT Frequency 2x / week   PT Duration 4 weeks   PT Treatment/Interventions ADLs/Self Care Home Management;Patient/family education;Stair training;Functional mobility training;Therapeutic activities;Therapeutic exercise;Balance training;DME Instruction;Gait training;Vestibular   PT Next Visit Plan d/c   PT Home Exercise Plan Clamshell, SLR, Bridges, Balance at counter top   Consulted and Agree with Plan of Care Patient      Patient will benefit from skilled therapeutic intervention in order to improve the following deficits and impairments:  Abnormal gait, Decreased activity tolerance, Decreased balance, Decreased coordination, Decreased mobility, Decreased endurance, Decreased strength, Impaired perceived functional ability, Decreased safety awareness, Decreased cognition  Visit Diagnosis: Other  abnormalities of gait and mobility  Muscle weakness (generalized)  Unsteadiness on feet     Problem List Patient Active Problem List   Diagnosis Date Noted  . History of subdural hematoma 07/21/2016  . S/P craniotomy 06/17/2016  . Pancytopenia due to antineoplastic chemotherapy ([place]) 06/30/2015  . Elevated diaphragm 06/30/2015  . DOE (dyspnea on exertion) 06/30/2015  . Diarrhea due to drug 06/30/2015  . CML (chronic myeloid leukemia) ([place]) 05/18/2015  . Encounter for chemotherapy management 05/18/2015  .  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 Hyperuricemia 05/15/2015  . Essential hypertension 11/27/2014  . Shortness of breath 12/03/2013  . Hx of Graves' disease 12/03/2013  . Elevated blood pressure reading 12/03/2013  . Special screening for malignant neoplasms, colon 08/09/2013  . Insomnia 04/29/2012  . Medication management 04/29/2012  . Foot pain 04/29/2012  . Polyp of cervix   . Fibroids   . Vitamin D deficiency   . Cubital tunnel syndrome 09/05/2011  . TENDINITIS 12/19/2008  . HYPERTHYROIDISM 03/06/2007  . ARTHRALGIA 03/05/2007  . INSOMNIA 03/05/2007  . GRAVE'S DISEASE 03/02/2007  . LOSS, HEARING NOS 03/02/2007  . Allergic rhinitis 03/02/2007    Miller,Jennifer L 08/02/2016, 2:42 PM  [facility] [address] [place], [place], [address] Phone: [phone]   Fax:  [phone]  Name: Floreen H Hamrick MRN: [id number] Date of Birth: 05/16/1961  Jennifer Miller, PT,DPT 08/02/16 2:43 PM Phone: [phone] Fax: [phone]    

## 2016-08-05 ENCOUNTER — Ambulatory Visit: Payer: Managed Care, Other (non HMO) | Admitting: Physical Therapy

## 2016-08-09 ENCOUNTER — Encounter: Payer: Managed Care, Other (non HMO) | Admitting: Occupational Therapy

## 2016-08-09 ENCOUNTER — Ambulatory Visit: Payer: Managed Care, Other (non HMO) | Admitting: Physical Therapy

## 2016-08-10 MED FILL — ZOLPIDEM TARTRATE 10 MG TAB: 10 | 90 days supply | Qty: 90 | Fill #1

## 2016-08-11 ENCOUNTER — Encounter: Payer: Managed Care, Other (non HMO) | Admitting: Occupational Therapy

## 2016-08-11 ENCOUNTER — Ambulatory Visit: Payer: Managed Care, Other (non HMO) | Admitting: Physical Therapy

## 2016-08-15 ENCOUNTER — Encounter: Payer: Managed Care, Other (non HMO) | Admitting: Occupational Therapy

## 2016-08-15 ENCOUNTER — Ambulatory Visit
Admission: RE | Admit: 2016-08-15 | Discharge: 2016-08-15 | Disposition: A | Discharge status: Home / Self Care | Payer: Managed Care, Other (non HMO) | Source: Ambulatory Visit | Attending: Neurological Surgery | Admitting: Neurological Surgery

## 2016-08-15 ENCOUNTER — Ambulatory Visit: Payer: Managed Care, Other (non HMO) | Admitting: Physical Therapy

## 2016-08-15 DIAGNOSIS — S065XAA Traumatic subdural hemorrhage with loss of consciousness status unknown, initial encounter: Secondary | ICD-10-CM

## 2016-08-15 DIAGNOSIS — I6200 Nontraumatic subdural hemorrhage, unspecified: Secondary | ICD-10-CM | POA: Diagnosis not present

## 2016-08-15 DIAGNOSIS — S065X9A Traumatic subdural hemorrhage with loss of consciousness of unspecified duration, initial encounter: Secondary | ICD-10-CM

## 2016-08-15 IMAGING — CT CT HEAD W/O CM
1 series · 15 of 30 positions shown, 19 images · non-contrast
Comparison: CT HEAD [DATE]

CLINICAL DATA: Followup subdural hematoma. Status post craniotomy
[DATE]. History of leukemia, hypertension.

EXAM:
CT HEAD WITHOUT CONTRAST
TECHNIQUE: Contiguous axial images were obtained from the base of the skull
through the vertex without intravenous contrast.

[Series 2: head w/(date) · axial · 0.43mm/px · z∈[-128,+7]mm · 15 of 31 slices shown, 19 images]
[im 2/31  brain]
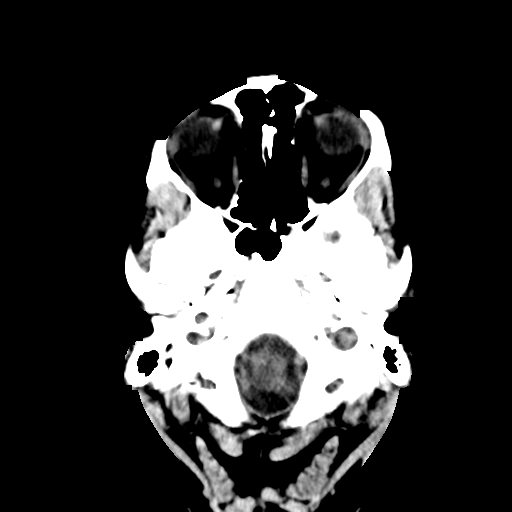
[im 2/31  bone]
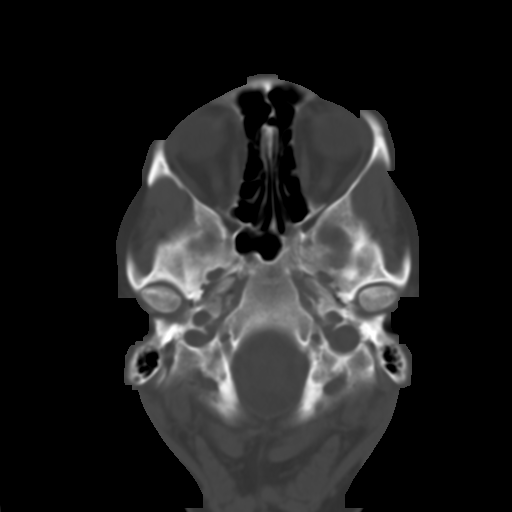
[im 4/31  brain]
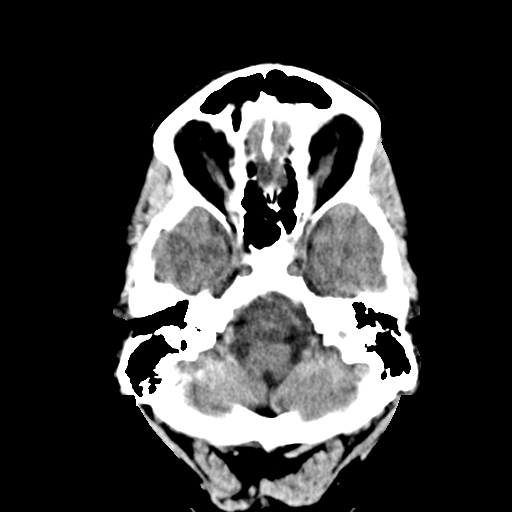
[im 6/31  brain]
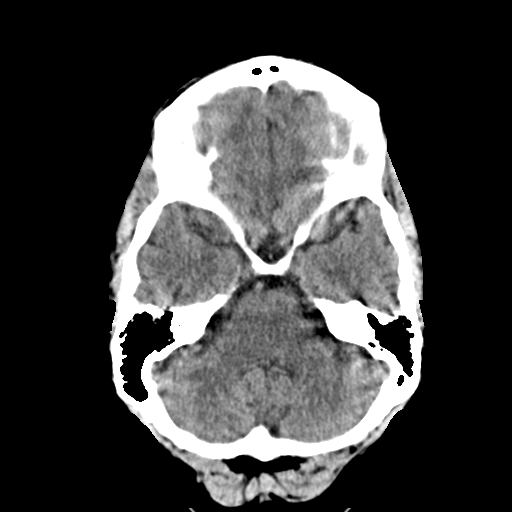
[im 8/31  brain]
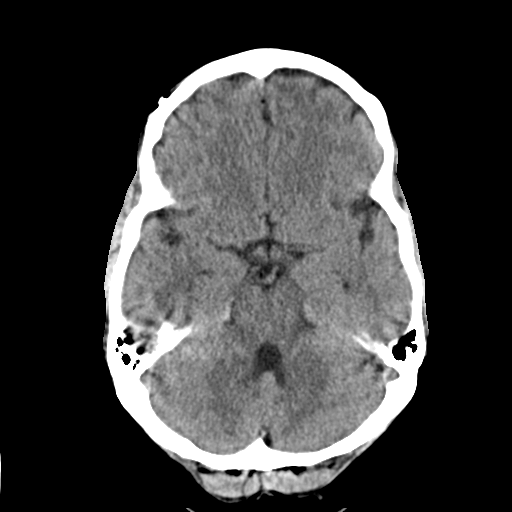
[im 10/31  brain]
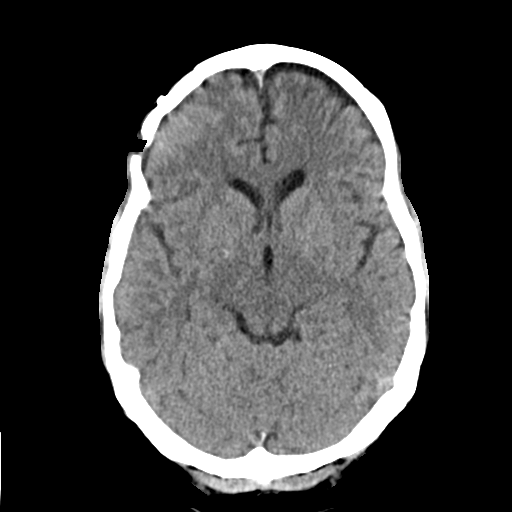
[im 10/31  bone]
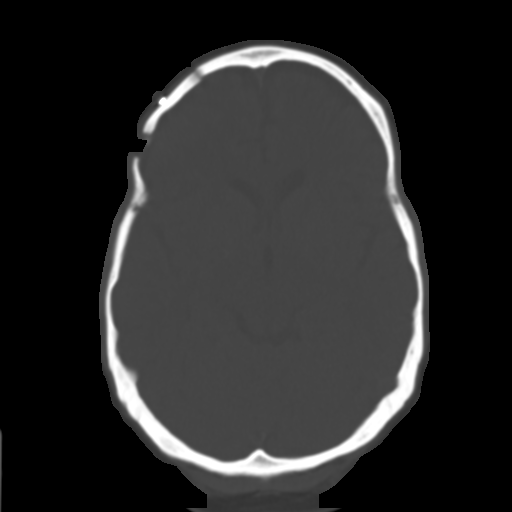
[im 12/31  brain]
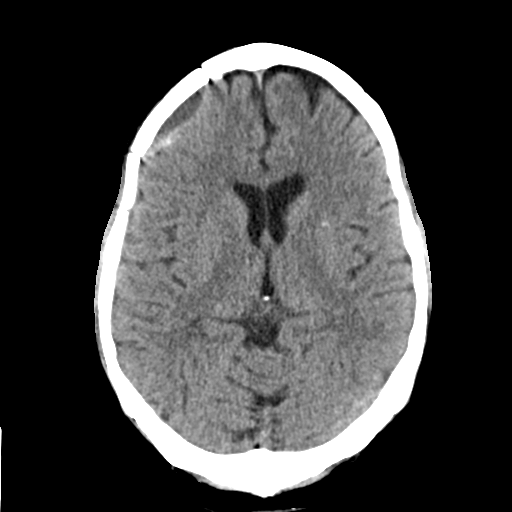
[im 14/31  brain]
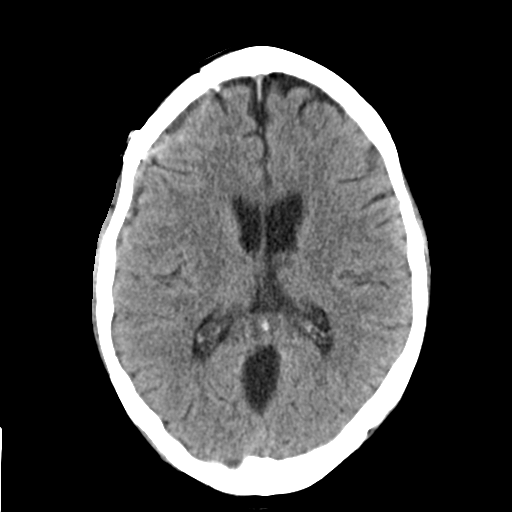
[im 16/31  brain]
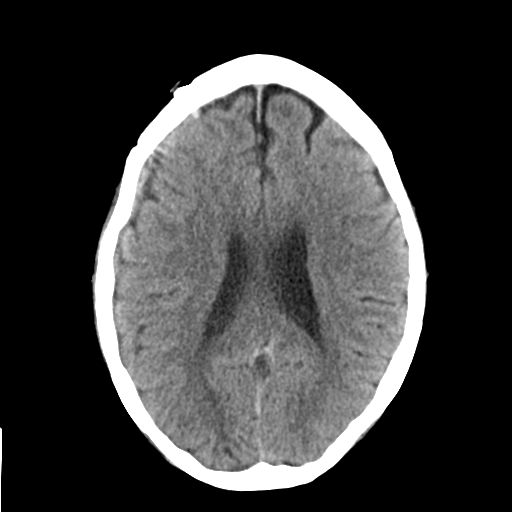
[im 17/31  brain]
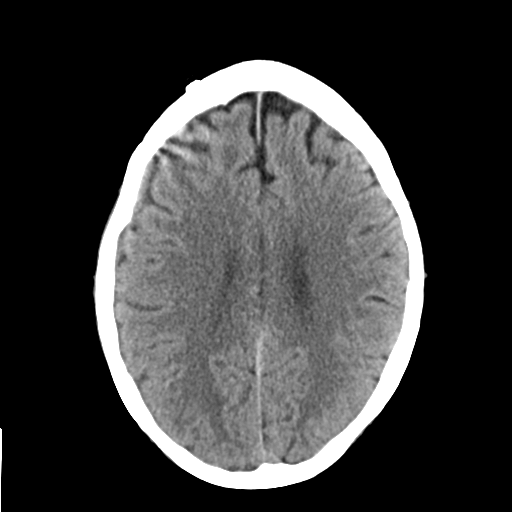
[im 17/31  bone]
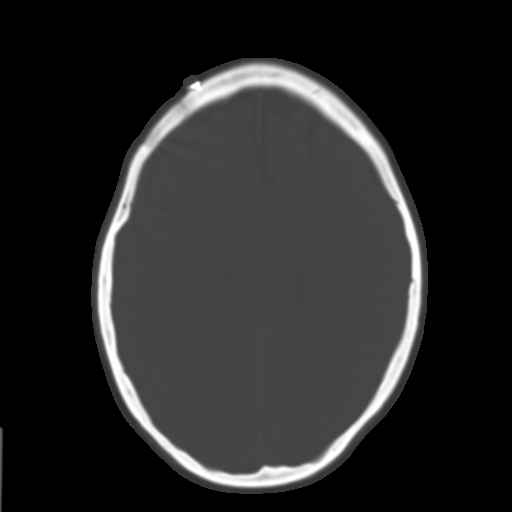
[im 19/31  brain]
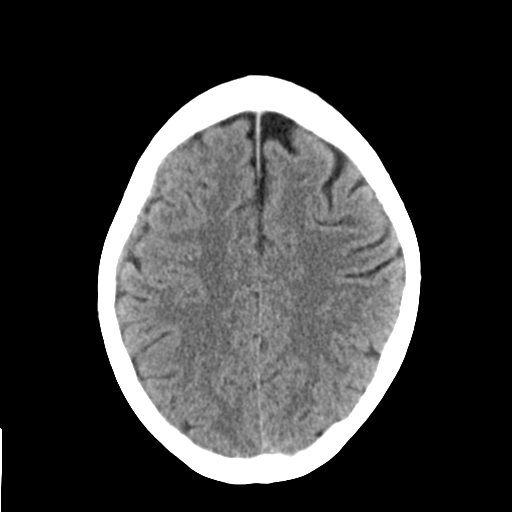
[im 21/31  brain]
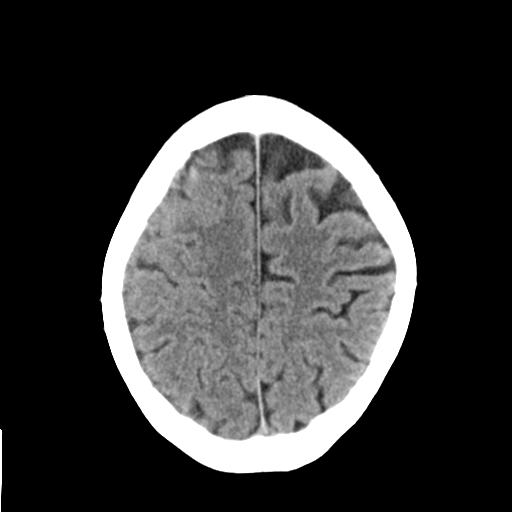
[im 23/31  brain]
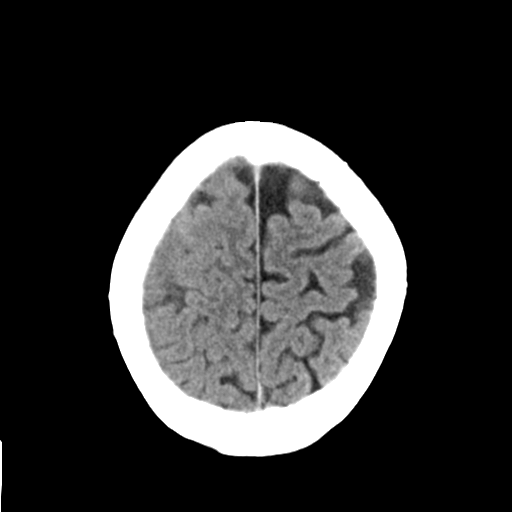
[im 25/31  brain]
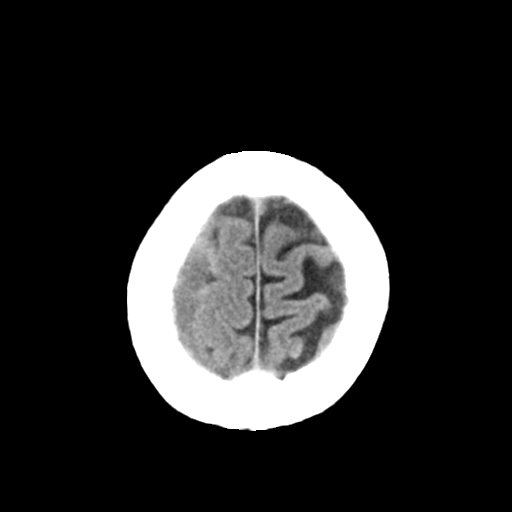
[im 25/31  bone]
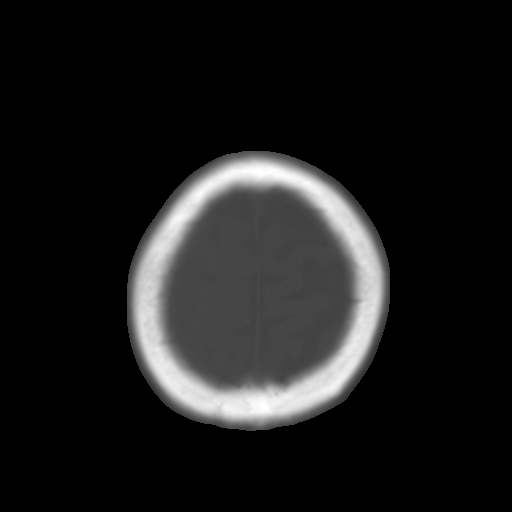
[im 27/31  brain]
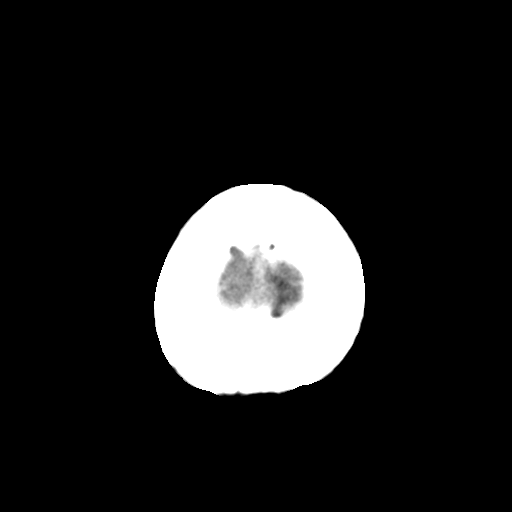
[im 29/31  brain]
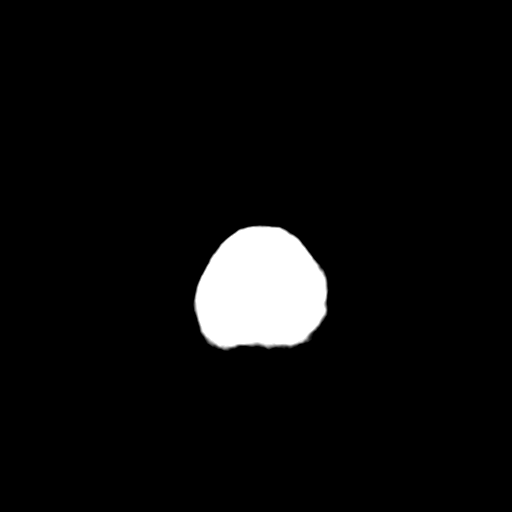

[15 of 30 positions shown; findings below may reference images not displayed]

FINDINGS: BRAIN: Small residual RIGHT frontoparietal intermediate to hypodense
subdural hematoma predominantly at the convexity without mass
effect. Predominant subdural component was 16 mm in depth, now 9 mm.
No rebleed. Ventricles and sulci are overall normal for patient's
age. No intraparenchymal hemorrhage, midline shift or acute large
vascular territory infarcts. Basal cisterns are patent.

VASCULAR: Unremarkable.

SKULL/SOFT TISSUES: No skull fracture. Status post RIGHT frontal
craniotomy. No significant soft tissue swelling.

ORBITS/SINUSES: The included ocular globes and orbital contents are
normal.The mastoid aircells and included paranasal sinuses are
well-aerated.

OTHER: None.
IMPRESSION: Small residual subacute to chronic RIGHT subdural hematoma, status
post RIGHT frontal craniotomy.

No acute intracranial process.

## 2016-08-18 ENCOUNTER — Encounter: Payer: Managed Care, Other (non HMO) | Admitting: Occupational Therapy

## 2016-08-18 ENCOUNTER — Ambulatory Visit: Payer: Managed Care, Other (non HMO) | Admitting: Physical Therapy

## 2016-08-22 ENCOUNTER — Encounter: Payer: Self-pay | Admitting: Occupational Therapy

## 2016-08-22 ENCOUNTER — Ambulatory Visit: Payer: Managed Care, Other (non HMO) | Attending: Neurological Surgery | Admitting: Occupational Therapy

## 2016-08-22 ENCOUNTER — Ambulatory Visit: Payer: Managed Care, Other (non HMO) | Admitting: Physical Therapy

## 2016-08-22 DIAGNOSIS — R41842 Visuospatial deficit: Secondary | ICD-10-CM | POA: Diagnosis not present

## 2016-08-22 DIAGNOSIS — R2681 Unsteadiness on feet: Secondary | ICD-10-CM | POA: Diagnosis not present

## 2016-08-22 DIAGNOSIS — R482 Apraxia: Secondary | ICD-10-CM | POA: Insufficient documentation

## 2016-08-22 DIAGNOSIS — R41844 Frontal lobe and executive function deficit: Secondary | ICD-10-CM | POA: Insufficient documentation

## 2016-08-22 DIAGNOSIS — M6281 Muscle weakness (generalized): Secondary | ICD-10-CM | POA: Diagnosis not present

## 2016-08-22 DIAGNOSIS — I69254 Hemiplegia and hemiparesis following other nontraumatic intracranial hemorrhage affecting left non-dominant side: Secondary | ICD-10-CM | POA: Diagnosis not present

## 2016-08-22 DIAGNOSIS — R29818 Other symptoms and signs involving the nervous system: Secondary | ICD-10-CM | POA: Diagnosis not present

## 2016-08-22 DIAGNOSIS — R278 Other lack of coordination: Secondary | ICD-10-CM | POA: Insufficient documentation

## 2016-08-22 NOTE — Therapy (Signed)
[facility] [address] [place], [place], [address] Phone: [phone]   Fax:  [phone]  Occupational Therapy Treatment  Patient Details  Name: Christina [name] MRN: [id number] Date of Birth: March 01, 1961 Referring Provider: Sherley Bounds  Encounter Date: 08/22/2016      OT End of Session - 08/22/16 0830    Visit Number 9   Number of Visits 16   Authorization Type UMR no  visit limit   OT Start Time 0803   OT Stop Time 0820   OT Time Calculation (min) 17 min   Activity Tolerance Patient tolerated treatment well      Past Medical History:  Diagnosis Date  . Allergic rhinitis   . Fibroid   . Fibroids   . Grave's disease   . H/O Graves' disease   . H/O insomnia   . H/O menorrhagia 2012  . History of chicken pox   . Hypertension   . Hyperthyroidism    s/p rai 8/06 now hypothyroid Dr. Suzette Battiest  . Hypothyroid   . Infertility, female   . Leukemia ([place])   . Menorrhagia    Had Novasure  . 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 Polyp of cervix    Endometriel polyp. JO  . Vitamin D deficiency   . Vitamin D deficiency     Past Surgical History:  Procedure Laterality Date  . BUNIONECTOMY  1986  . CRANIOTOMY N/A 06/17/2016   Procedure: RIGHT CRANIOTOMY HEMATOMA EVACUATION SUBDURAL;  Surgeon: Eustace Moore, MD;  Location: [facility];  Service: Neurosurgery;  Laterality: N/A;  . CRANIOTOMY Right 06/18/2016   Procedure: CRANIOTOMY HEMATOMA EVACUATION SUBDURAL RIGHT;  Surgeon: Eustace Moore, MD;  Location: [facility];  Service: Neurosurgery;  Laterality: Right;  . DILATION AND CURETTAGE OF UTERUS  09/15/2010  . HYSTEROSCOPY W/D&C  09/28/2010  . MOUTH SURGERY    . NOVASURE ABLATION  09/28/2010  . REFRACTIVE SURGERY     for vision  . TONSILLECTOMY  1965    There were no vitals filed for this visit.      Subjective Assessment - 08/22/16 0803    Patient Stated Goals To use my hand better   Currently in Pain?  No/denies                      OT Treatments/Exercises (OP) - 08/22/16 0001      ADLs   ADL Comments Pt returns to clinic after 2 week hold to allow pt to work at home via HEP on LUE coordination, grip strength, functional use and activity tolearance. Checked LUE function - 9 hole peg at 28.23, grip strength 57 pounds, Box and blocks 56.  Pt reports sensation has returned to her left hand and she feels that she is using it as non dominant the way she did before this event.  Pt has completed her driving eval and was cleared to drive.  Visual attention to L appears intact and pt demonstrates signficant improvement in motor planning and functional use of L hand.  Pt also reports that activity tolerance has improved greatly and she plans to return to work full time tomorrow.  Pt declined neuropsych testing however understands how to access this should she have difficulty from a cognitive standpoint upon return to work. Pt has also returned to the gym and is exercising 2-3 times per week per pt.  OT Short Term Goals - 08/22/16 0827      OT SHORT TERM GOAL #1   Title Pt and husband will be mod I with HEP - 08/01/2016   Status Achieved     OT SHORT TERM GOAL #2   Title Pt will be min a with dressing UB   Status Achieved     OT SHORT TERM GOAL #3   Title Pt will be mod a with dressing LB   Status Achieved     OT SHORT TERM GOAL #4   Title Pt will be mod I  for bathing   Status Achieved     OT SHORT TERM GOAL #5   Title Pt will demonstate improved grip strength by at least 5 pounds (baseline= 25) to assist with functional tasks   Status Achieved  40 pounds     OT SHORT TERM GOAL #6   Title Pt will be mod I with organizing meds   Status Achieved     OT SHORT TERM GOAL #7   Title Pt will be mod I with tub transfers   Status Achieved     OT SHORT TERM GOAL #8   Title Pt will improve on Box and blocks by 5 blocks (baseline=14) with LUE as  evidence of improved UE function   Status Achieved  34     OT SHORT TERM GOAL  #9   TITLE Pt will attend to L field with min vc's.   Status Achieved  per observation and pt and husband report           OT Long Term Goals - 08/22/16 3335      OT LONG TERM GOAL #1   Title Pt will be mod I with HEP - 08/29/2016   Status Achieved     OT LONG TERM GOAL #2   Title Pt will be mod I with dressing   Status Achieved     OT LONG TERM GOAL #3   Title Pt will improve grip strength LUE to at least 45 pounds to assist with functional activities. (baseline= 25)   Status Achieved  57 pounds     OT LONG TERM GOAL #4   Title Pt will improve on Box and Blocks by at least 8 blocks as evidence of improved UE function   Status Achieved  56     OT LONG TERM GOAL #5   Title Pt will be mod I with simple familiar meal prep   Status Achieved     OT LONG TERM GOAL #6   Title Pt will demonstrate ability to lift 3 pound object overhead 5 times without dropping with LUE   Status Achieved  able to lift 6 pound object x5 with no dropping     OT LONG TERM GOAL #7   Title Pt will demonstate ability to carry plate of food from counter to table with LUE   Status Achieved     OT LONG TERM GOAL #8   Title Pt will be able to complete 9 hole peg test in 2 minutes or less as demonstration of improved coordination   Status Achieved  28.23 seconds     OT LONG TERM GOAL  #9   Baseline Pt will be mod I with meds (including opening containers)   Status Achieved     OT LONG TERM GOAL  #10   TITLE Pt will be mod I with simple laundry, clean up tasks in home.   Status Achieved  Plan - 08/22/16 0829    Clinical Impression Statement  Pt has met or exceeded all goals and is now ready for discharge.  Pt has made significant progress and plans to return to work full time tomorrow.    Rehab Potential Good   OT Frequency 2x / week   OT Duration 8 weeks   OT Treatment/Interventions  Self-care/ADL training;Moist Heat;Electrical Stimulation;Fluidtherapy;DME and/or AE instruction;Energy conservation;Neuromuscular education;Therapeutic exercise;Functional Mobility Training;Manual Therapy;Passive range of motion;Splinting;Therapeutic activities;Patient/family education;Visual/perceptual remediation/compensation;Cognitive remediation/compensation   Plan d/c from OT   Consulted and Agree with Plan of Care Patient      Patient will benefit from skilled therapeutic intervention in order to improve the following deficits and impairments:  Decreased activity tolerance, Decreased endurance, Decreased coordination, Decreased cognition, Decreased knowledge of use of DME, Decreased range of motion, Decreased strength, Impaired UE functional use, Impaired tone, Impaired sensation, Impaired vision/preception  Visit Diagnosis: Muscle weakness (generalized)  Unsteadiness on feet  Other lack of coordination  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 History of subdural hematoma 07/21/2016  . S/P craniotomy 06/17/2016  . Pancytopenia due to antineoplastic chemotherapy ([place]) 06/30/2015  . Elevated diaphragm 06/30/2015  . DOE (dyspnea on exertion) 06/30/2015  . Diarrhea due to drug 06/30/2015  . CML (chronic myeloid leukemia) ([place]) 05/18/2015  . Encounter for chemotherapy management 05/18/2015  . 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 Hyperuricemia 05/15/2015  . Essential hypertension 11/27/2014  . Shortness of breath 12/03/2013  . Hx of Graves' disease 12/03/2013  . Elevated blood pressure reading 12/03/2013  . Special screening for malignant neoplasms, colon 08/09/2013  . Insomnia 04/29/2012  .  Medication management 04/29/2012  . Foot pain 04/29/2012  . Polyp of cervix   . Fibroids   . Vitamin D deficiency   . Cubital tunnel syndrome 09/05/2011  . TENDINITIS 12/19/2008  . HYPERTHYROIDISM 03/06/2007  . ARTHRALGIA 03/05/2007  . INSOMNIA 03/05/2007  . [place] DISEASE 03/02/2007  . LOSS, HEARING NOS 03/02/2007  . Allergic rhinitis 03/02/2007   OCCUPATIONAL THERAPY DISCHARGE SUMMARY  Visits from Start of Care: 9  Current functional level related to goals / functional outcomes: See above   Remaining deficits: Mild LUE incoordination   Education / Equipment: HEP Plan: Patient agrees to discharge.  Patient goals were met. Patient is being discharged due to meeting the stated rehab goals.  ?????      Quay Burow, OTR/L 08/22/2016, 8:31 AM  [facility] [address] [place], [place], [address] Phone: [phone]   Fax:  [phone]  Name: Christina [name] MRN: [id number] Date of Birth: 1961-06-27

## 2016-08-25 ENCOUNTER — Encounter: Payer: Managed Care, Other (non HMO) | Admitting: Occupational Therapy

## 2016-08-25 ENCOUNTER — Ambulatory Visit: Payer: Managed Care, Other (non HMO) | Admitting: Physical Therapy

## 2016-08-29 ENCOUNTER — Ambulatory Visit: Payer: Managed Care, Other (non HMO) | Admitting: Physical Therapy

## 2016-08-29 ENCOUNTER — Encounter: Payer: Managed Care, Other (non HMO) | Admitting: Occupational Therapy

## 2016-08-31 ENCOUNTER — Ambulatory Visit: Payer: Managed Care, Other (non HMO) | Admitting: Physical Therapy

## 2016-08-31 ENCOUNTER — Ambulatory Visit: Payer: Managed Care, Other (non HMO) | Admitting: Occupational Therapy

## 2016-09-01 ENCOUNTER — Telehealth: Payer: Self-pay | Admitting: Hematology and Oncology

## 2016-09-01 ENCOUNTER — Other Ambulatory Visit (HOSPITAL_BASED_OUTPATIENT_CLINIC_OR_DEPARTMENT_OTHER): Payer: Managed Care, Other (non HMO)

## 2016-09-01 DIAGNOSIS — C9210 Chronic myeloid leukemia, BCR/ABL-positive, not having achieved remission: Secondary | ICD-10-CM | POA: Diagnosis not present

## 2016-09-01 LAB — COMPREHENSIVE METABOLIC PANEL
ALBUMIN: 4 g/dL (ref 3.5–5.0)
ALK PHOS: 52 U/L (ref 40–150)
ALT: 17 U/L (ref 0–55)
ANION GAP: 10 meq/L (ref 3–11)
AST: 16 U/L (ref 5–34)
BUN: 13.6 mg/dL (ref 7.0–26.0)
CO2: 21 meq/L — AB (ref 22–29)
CREATININE: 0.9 mg/dL (ref 0.6–1.1)
Calcium: 9.3 mg/dL (ref 8.4–10.4)
Chloride: 107 mEq/L (ref 98–109)
EGFR: 70 mL/min/{1.73_m2} — ABNORMAL LOW (ref >90)
Glucose: 107 mg/dl (ref 70–140)
Potassium: 4.1 mEq/L (ref 3.5–5.1)
Sodium: 138 mEq/L (ref 136–145)
Total Bilirubin: 0.33 mg/dL (ref 0.20–1.20)
Total Protein: 7.2 g/dL (ref 6.4–8.3)

## 2016-09-01 LAB — CBC WITH DIFFERENTIAL/PLATELET
BASO%: 1.4 % (ref 0.0–2.0)
BASOS ABS: 0.1 10*3/uL (ref 0.0–0.1)
EOS%: 3.7 % (ref 0.0–7.0)
Eosinophils Absolute: 0.3 10*3/uL (ref 0.0–0.5)
HCT: 38.1 % (ref 34.8–46.6)
HEMOGLOBIN: 12.8 g/dL (ref 11.6–15.9)
LYMPH%: 26.3 % (ref 14.0–49.7)
MCH: 30.9 pg (ref 25.1–34.0)
MCHC: 33.7 g/dL (ref 31.5–36.0)
MCV: 91.6 fL (ref 79.5–101.0)
MONO#: 0.6 10*3/uL (ref 0.1–0.9)
MONO%: 8.8 % (ref 0.0–14.0)
NEUT#: 4 10*3/uL (ref 1.5–6.5)
NEUT%: 59.8 % (ref 38.4–76.8)
Platelets: 342 10*3/uL (ref 145–400)
RBC: 4.16 10*6/uL (ref 3.70–5.45)
RDW: 15.1 % — AB (ref 11.2–14.5)
WBC: 6.7 10*3/uL (ref 3.9–10.3)
lymph#: 1.8 10*3/uL (ref 0.9–3.3)

## 2016-09-01 NOTE — Telephone Encounter (Signed)
Appointments rescheduled per patient request. The patient's daughter is returning home and the patient needs to pick up her daughter from the airport.

## 2016-09-08 ENCOUNTER — Telehealth: Payer: Self-pay | Admitting: *Deleted

## 2016-09-08 NOTE — Telephone Encounter (Signed)
-----   Message from Heath Lark, MD sent at 09/08/2016 11:17 AM EST ----- Regarding: labs Pls call her and let her know labs are still good, she is still in remission Continue reduced dose Sprycel ----- Message ----- From: Interface, Lab In Three Zero One Sent: 09/01/2016   8:36 AM To: Heath Lark, MD

## 2016-09-08 NOTE — Telephone Encounter (Signed)
Left message to call Dr Gorsuch' nurse 

## 2016-09-12 ENCOUNTER — Telehealth: Payer: Self-pay

## 2016-09-12 NOTE — Telephone Encounter (Signed)
Left message with below note.

## 2016-09-12 NOTE — Telephone Encounter (Signed)
-----   Message from Patton Salles, RN sent at 09/12/2016  8:40 AM EST ----- Regarding: FW: labs   ----- Message ----- From: Heath Lark, MD Sent: 09/08/2016  11:17 AM To: Patton Salles, RN Subject: labs                                           Pls call her and let her know labs are still good, she is still in remission Continue reduced dose Sprycel ----- Message ----- From: Interface, Lab In Three Zero One Sent: 09/01/2016   8:36 AM To: Heath Lark, MD

## 2016-09-19 ENCOUNTER — Telehealth: Payer: Self-pay | Admitting: Family Medicine

## 2016-09-19 MED ORDER — LISINOPRIL 20 MG PO TABS: 20.0000 mg | ORAL_TABLET | Freq: Every day | ORAL | 3 refills | Status: DC

## 2016-09-19 MED FILL — LISINOPRIL 20 MG TABLET: 20 | 90 days supply | Qty: 90 | Fill #0

## 2016-09-19 NOTE — Telephone Encounter (Signed)
Please call in 90 day refills for Lisinopril to [facility]

## 2016-09-19 NOTE — Telephone Encounter (Signed)
Sent to the pharmacy by e-scribe. 

## 2016-09-19 NOTE — Telephone Encounter (Signed)
Please send in 90 days with refill x 3    Lab Results  Component Value Date   WBC 6.7 09/01/2016   HGB 12.8 09/01/2016   HCT 38.1 09/01/2016   PLT 342 09/01/2016   GLUCOSE 107 09/01/2016   CHOL 200 05/13/2015   TRIG 366.0 (H) 05/13/2015   HDL 43.60 05/13/2015   LDLDIRECT 119.0 05/13/2015   LDLCALC 123 (H) 05/17/2010   ALT 17 09/01/2016   AST 16 09/01/2016   NA 138 09/01/2016   K 4.1 09/01/2016   CL 106 06/17/2016   CREATININE 0.9 09/01/2016   BUN 13.6 09/01/2016   CO2 21 (L) 09/01/2016   TSH 1.61 09/18/2015   INR 0.90 06/17/2016

## 2016-10-06 ENCOUNTER — Other Ambulatory Visit: Payer: Managed Care, Other (non HMO)

## 2016-10-11 ENCOUNTER — Telehealth: Payer: Self-pay | Admitting: Family Medicine

## 2016-10-11 DIAGNOSIS — I6200 Nontraumatic subdural hemorrhage, unspecified: Secondary | ICD-10-CM | POA: Diagnosis not present

## 2016-10-11 NOTE — Telephone Encounter (Signed)
She had been prescribed Phentermine 2 years ago to help with weight loss but she never got this filled. She would like to try it now. Please call in a supply to [facility]

## 2016-10-12 ENCOUNTER — Other Ambulatory Visit: Payer: Self-pay | Admitting: Emergency Medicine

## 2016-10-12 MED ORDER — PHENTERMINE HCL 30 MG PO CAPS: 30.0000 mg | ORAL_CAPSULE | ORAL | 1 refills | Status: DC

## 2016-10-12 NOTE — Telephone Encounter (Signed)
Please  Send in phentermine 30 mg  1 po qd disp 30 refill x 1 to pharmacy

## 2016-10-12 NOTE — Telephone Encounter (Signed)
Could you look at this please ?

## 2016-10-12 NOTE — Telephone Encounter (Signed)
Called in rx to pharmacy.

## 2016-10-13 ENCOUNTER — Telehealth: Payer: Self-pay | Admitting: Hematology and Oncology

## 2016-10-13 ENCOUNTER — Ambulatory Visit: Payer: Managed Care, Other (non HMO) | Admitting: Hematology and Oncology

## 2016-10-13 ENCOUNTER — Other Ambulatory Visit: Payer: Managed Care, Other (non HMO)

## 2016-10-13 MED FILL — PHENTERMINE 30 MG CAPSULE: 30 | 30 days supply | Qty: 30 | Fill #0

## 2016-10-13 NOTE — Telephone Encounter (Signed)
Patient came to scheduling to reschedule her appointments. She missed her 3/1 lab appointment and therefore could not come to the 3/8 follow up appointment. Spoke with NG to see the best time to reschedule appointments for the patient.The patient cannot come during the week of 3/19 because of her work schedule. She is available on 3/27 for her lab and 4/3 for the follow up.

## 2016-10-18 ENCOUNTER — Telehealth: Payer: Self-pay | Admitting: Family Medicine

## 2016-10-18 DIAGNOSIS — Z8639 Personal history of other endocrine, nutritional and metabolic disease: Secondary | ICD-10-CM

## 2016-10-18 DIAGNOSIS — E039 Hypothyroidism, unspecified: Secondary | ICD-10-CM

## 2016-10-18 MED ORDER — SYNTHROID 150 MCG PO TABS: 150.0000 ug | ORAL_TABLET | Freq: Every day | ORAL | 3 refills | Status: DC

## 2016-10-18 MED FILL — SYNTHROID 150 MCG TABLET: 150 | 90 days supply | Qty: 90 | Fill #0

## 2016-10-18 NOTE — Telephone Encounter (Signed)
Can she have refills on Synthroid called in. She can come by for labs this week.

## 2016-10-18 NOTE — Telephone Encounter (Signed)
Med sent in  Come in for tsh  Check   Lab Results  Component Value Date   WBC 6.7 09/01/2016   HGB 12.8 09/01/2016   HCT 38.1 09/01/2016   PLT 342 09/01/2016   GLUCOSE 107 09/01/2016   CHOL 200 05/13/2015   TRIG 366.0 (H) 05/13/2015   HDL 43.60 05/13/2015   LDLDIRECT 119.0 05/13/2015   LDLCALC 123 (H) 05/17/2010   ALT 17 09/01/2016   AST 16 09/01/2016   NA 138 09/01/2016   K 4.1 09/01/2016   CL 106 06/17/2016   CREATININE 0.9 09/01/2016   BUN 13.6 09/01/2016   CO2 21 (L) 09/01/2016   TSH 1.61 09/18/2015   INR 0.90 06/17/2016

## 2016-10-20 ENCOUNTER — Ambulatory Visit: Payer: Managed Care, Other (non HMO) | Admitting: Hematology and Oncology

## 2016-10-28 MED FILL — VERAPAMIL ER 240 MG TABLET: 240 | 90 days supply | Qty: 90 | Fill #1

## 2016-10-31 ENCOUNTER — Telehealth: Payer: Self-pay | Admitting: Family Medicine

## 2016-10-31 MED ORDER — ZOLPIDEM TARTRATE 10 MG PO TABS: 10.0000 mg | ORAL_TABLET | Freq: Every evening | ORAL | 1 refills | Status: DC | PRN

## 2016-10-31 NOTE — Telephone Encounter (Signed)
Please call in refills for Zolpidem 10 mg to [facility]

## 2016-10-31 NOTE — Telephone Encounter (Signed)
Called in rx

## 2016-10-31 NOTE — Telephone Encounter (Signed)
Ok to send in 1 refill   Disp 90#

## 2016-11-01 ENCOUNTER — Other Ambulatory Visit (HOSPITAL_BASED_OUTPATIENT_CLINIC_OR_DEPARTMENT_OTHER): Payer: Managed Care, Other (non HMO)

## 2016-11-01 ENCOUNTER — Encounter (INDEPENDENT_AMBULATORY_CARE_PROVIDER_SITE_OTHER): Payer: Self-pay

## 2016-11-01 DIAGNOSIS — C9210 Chronic myeloid leukemia, BCR/ABL-positive, not having achieved remission: Secondary | ICD-10-CM

## 2016-11-01 LAB — CBC WITH DIFFERENTIAL/PLATELET
BASO%: 1.3 % (ref 0.0–2.0)
BASOS ABS: 0.1 10*3/uL (ref 0.0–0.1)
EOS ABS: 0.5 10*3/uL (ref 0.0–0.5)
EOS%: 8.8 % — ABNORMAL HIGH (ref 0.0–7.0)
HEMATOCRIT: 41.2 % (ref 34.8–46.6)
HGB: 13.9 g/dL (ref 11.6–15.9)
LYMPH#: 1.9 10*3/uL (ref 0.9–3.3)
LYMPH%: 32.6 % (ref 14.0–49.7)
MCH: 30.5 pg (ref 25.1–34.0)
MCHC: 33.9 g/dL (ref 31.5–36.0)
MCV: 90.1 fL (ref 79.5–101.0)
MONO#: 0.4 10*3/uL (ref 0.1–0.9)
MONO%: 7.8 % (ref 0.0–14.0)
NEUT#: 2.8 10*3/uL (ref 1.5–6.5)
NEUT%: 49.5 % (ref 38.4–76.8)
PLATELETS: 276 10*3/uL (ref 145–400)
RBC: 4.57 10*6/uL (ref 3.70–5.45)
RDW: 14.7 % — ABNORMAL HIGH (ref 11.2–14.5)
WBC: 5.7 10*3/uL (ref 3.9–10.3)

## 2016-11-01 LAB — COMPREHENSIVE METABOLIC PANEL
ALT: 19 U/L (ref 0–55)
AST: 16 U/L (ref 5–34)
Albumin: 3.9 g/dL (ref 3.5–5.0)
Alkaline Phosphatase: 52 U/L (ref 40–150)
Anion Gap: 10 mEq/L (ref 3–11)
BILIRUBIN TOTAL: 0.32 mg/dL (ref 0.20–1.20)
BUN: 13.1 mg/dL (ref 7.0–26.0)
CHLORIDE: 105 meq/L (ref 98–109)
CO2: 21 meq/L — AB (ref 22–29)
Calcium: 9.3 mg/dL (ref 8.4–10.4)
Creatinine: 0.8 mg/dL (ref 0.6–1.1)
EGFR: 78 mL/min/{1.73_m2} — AB (ref >90)
Glucose: 99 mg/dl (ref 70–140)
POTASSIUM: 4.1 meq/L (ref 3.5–5.1)
SODIUM: 136 meq/L (ref 136–145)
TOTAL PROTEIN: 7 g/dL (ref 6.4–8.3)

## 2016-11-08 ENCOUNTER — Other Ambulatory Visit: Payer: Self-pay | Admitting: *Deleted

## 2016-11-08 ENCOUNTER — Encounter: Payer: Self-pay | Admitting: Hematology and Oncology

## 2016-11-08 ENCOUNTER — Ambulatory Visit (HOSPITAL_BASED_OUTPATIENT_CLINIC_OR_DEPARTMENT_OTHER): Payer: Managed Care, Other (non HMO) | Admitting: Hematology and Oncology

## 2016-11-08 ENCOUNTER — Telehealth: Payer: Self-pay | Admitting: Hematology and Oncology

## 2016-11-08 DIAGNOSIS — J986 Disorders of diaphragm: Secondary | ICD-10-CM | POA: Diagnosis not present

## 2016-11-08 DIAGNOSIS — C9210 Chronic myeloid leukemia, BCR/ABL-positive, not having achieved remission: Secondary | ICD-10-CM | POA: Diagnosis not present

## 2016-11-08 MED ORDER — DASATINIB 50 MG PO TABS: 50.0000 mg | ORAL_TABLET | Freq: Every day | ORAL | 3 refills | Status: DC

## 2016-11-08 NOTE — Assessment & Plan Note (Signed)
She has chronic elevated right diaphragm. Examination showed persistent reduced breath sounds but she is not symptomatic. Observe only. 

## 2016-11-08 NOTE — Telephone Encounter (Signed)
Appointments scheduled per 4.3.18 LOS. Patient given AVS report and calendars with future scheduled appointments.  °

## 2016-11-08 NOTE — Progress Notes (Signed)
[facility] OFFICE PROGRESS NOTE  Patient Care Team: Burnis Medin, MD as PCP - General [name], MD (Endocrinology) [name], MD (Internal Medicine) Everett Graff, MD as Attending Physician (Obstetrics and Gynecology)  SUMMARY OF ONCOLOGIC HISTORY:   CML (chronic myeloid leukemia) ([place])   05/15/2015 Bone Marrow Biopsy    BM biopsy and FISH confirmed CML [id number]      05/15/2015 - 05/20/2015 Chemotherapy    She started taking Hydrea 1500 mg daily      05/15/2015 Tumor Marker    BCR/ABL b2a2 87.5%, IS 49%      05/21/2015 -  Chemotherapy    She started taking Sprycel 100 mg daily      06/29/2015 Adverse Reaction    She had diarrhea. Dose of Srycel is reduced to 50 mg daily      09/10/2015 Tumor Marker    BCR/ABL b2a2 0.21%, IS 0.1743%      12/11/2015 Pathology Results    BCR/ABL b2a2 0.19%, IS 0.1577%      04/13/2016 Pathology Results    BCR/ABL b2a2 0.29%, IS 0.2407%      07/14/2016 Pathology Results    BCR/ABL b2a2 0.004%, IS 0.0092%. She is in MMR      07/21/2016 Miscellaneous    Dose of Sprycel is reduced to 50 mg daily      09/08/2016 Pathology Results    BCR/ABL b2a2 0.001%, IS 0.0023%. She is in MMR      11/01/2016 Pathology Results    BCR/ABL b2a2 0.001%, IS 0.0023%. She is in MMR       INTERVAL HISTORY: Please see below for problem oriented charting. She returns for further follow-up She complain of occasional cough but denies fever or chills She tolerated reduced dose Sprycel well Denies recent infection  REVIEW OF SYSTEMS:   Constitutional: Denies fevers, chills or abnormal weight loss Eyes: Denies blurriness of vision Ears, nose, mouth, throat, and face: Denies mucositis or sore throat Cardiovascular: Denies palpitation, chest discomfort or lower extremity swelling Gastrointestinal:  Denies nausea, heartburn or change in bowel habits Skin: Denies abnormal skin rashes Lymphatics: Denies new lymphadenopathy or easy  bruising Neurological:Denies numbness, tingling or new weaknesses Behavioral/Psych: Mood is stable, no new changes  All other systems were reviewed with the patient and are negative.  I have reviewed the past medical history, past surgical history, social history and family history with the patient and they are unchanged from previous note.  ALLERGIES:  has No Known Allergies.  MEDICATIONS:  Current Outpatient Prescriptions  Medication Sig Dispense Refill  . dasatinib (SPRYCEL) 50 MG tablet Take 1 tablet (50 mg total) by mouth daily. 90 tablet 3  . levETIRAcetam (KEPPRA) 500 MG tablet Take 1 tablet (500 mg total) by mouth 2 (two) times daily. (Patient taking differently: Take 750 mg by mouth 2 (two) times daily. ) 30 tablet 1  . lisinopril (PRINIVIL,ZESTRIL) 20 MG tablet Take 1 tablet (20 mg total) by mouth daily. 90 tablet 3  . phentermine 30 MG capsule Take 1 capsule (30 mg total) by mouth every morning. 30 capsule 1  . SYNTHROID 150 MCG tablet Take 1 tablet (150 mcg total) by mouth daily before breakfast. 90 tablet 3  . verapamil (CALAN-SR) 240 MG CR tablet Take 1 tablet (240 mg total) by mouth at bedtime. 90 tablet 1  . zolpidem (AMBIEN) 10 MG tablet Take 1 tablet (10 mg total) by mouth at bedtime as needed for sleep. 90 tablet 1   No current  facility-administered medications for this visit.     PHYSICAL EXAMINATION: ECOG PERFORMANCE STATUS: 1 - Symptomatic but completely ambulatory  Vitals:   11/08/16 0919  BP: (!) 117/57  Pulse: 74  Resp: 20  Temp: 97.8 F (36.6 C)   Filed Weights   11/08/16 0919  Weight: 180 lb 11.2 oz (82 kg)    GENERAL:alert, no distress and comfortable SKIN: skin color, texture, turgor are normal, no rashes or significant lesions EYES: normal, Conjunctiva are pink and non-injected, sclera clear OROPHARYNX:no exudate, no erythema and lips, buccal mucosa, and tongue normal  NECK: supple, thyroid normal size, non-tender, without nodularity LYMPH:   no palpable lymphadenopathy in the cervical, axillary or inguinal LUNGS: clear to auscultation with normal breathing effort, reduced breath sounds in the right lung base, unchanged HEART: regular rate & rhythm and no murmurs and no lower extremity edema ABDOMEN:abdomen soft, non-tender and normal bowel sounds Musculoskeletal:no cyanosis of digits and no clubbing  NEURO: alert & oriented x 3 with fluent speech, no focal motor/sensory deficits  LABORATORY DATA:  I have reviewed the data as listed    Component Value Date/Time   NA 136 11/01/2016 0844   K 4.1 11/01/2016 0844   CL 106 06/17/2016 1125   CO2 21 (L) 11/01/2016 0844   GLUCOSE 99 11/01/2016 0844   BUN 13.1 11/01/2016 0844   CREATININE 0.8 11/01/2016 0844   CALCIUM 9.3 11/01/2016 0844   PROT 7.0 11/01/2016 0844   ALBUMIN 3.9 11/01/2016 0844   AST 16 11/01/2016 0844   ALT 19 11/01/2016 0844   ALKPHOS 52 11/01/2016 0844   BILITOT 0.32 11/01/2016 0844   GFRNONAA >60 06/17/2016 1125   GFRAA >60 06/17/2016 1125    No results found for: SPEP, UPEP  Lab Results  Component Value Date   WBC 5.7 11/01/2016   NEUTROABS 2.8 11/01/2016   HGB 13.9 11/01/2016   HCT 41.2 11/01/2016   MCV 90.1 11/01/2016   PLT 276 11/01/2016      Chemistry      Component Value Date/Time   NA 136 11/01/2016 0844   K 4.1 11/01/2016 0844   CL 106 06/17/2016 1125   CO2 21 (L) 11/01/2016 0844   BUN 13.1 11/01/2016 0844   CREATININE 0.8 11/01/2016 0844      Component Value Date/Time   CALCIUM 9.3 11/01/2016 0844   ALKPHOS 52 11/01/2016 0844   AST 16 11/01/2016 0844   ALT 19 11/01/2016 0844   BILITOT 0.32 11/01/2016 0844       ASSESSMENT & PLAN:  CML (chronic myeloid leukemia) (HCC) The patient has achieved major molecular response. I do not believe Sprycel is the cause of her hematoma. I recommend continue on reduced dose Sprycel 50 mg daily I will see her back in 3 months to repeat blood work as well.  Elevated diaphragm  She has  chronic elevated right diaphragm. Examination showed persistent reduced breath sounds but she is not symptomatic. Observe only.   No orders of the defined types were placed in this encounter.  All questions were answered. The patient knows to call the clinic with any problems, questions or concerns. No barriers to learning was detected. I spent 10 minutes counseling the patient face to face. The total time spent in the appointment was 15 minutes and more than 50% was on counseling and review of test results     Heath Lark, MD 11/08/2016 10:30 AM

## 2016-11-08 NOTE — Assessment & Plan Note (Signed)
The patient has achieved major molecular response. I do not believe Sprycel is the cause of her hematoma. I recommend continue on reduced dose Sprycel 50 mg daily I will see her back in 3 months to repeat blood work as well.

## 2016-11-17 MED FILL — SPRYCEL 50 MG TABLET: 50 | 30 days supply | Qty: 30 | Fill #0

## 2016-11-21 ENCOUNTER — Telehealth: Payer: Self-pay | Admitting: Family Medicine

## 2016-11-21 ENCOUNTER — Telehealth: Payer: Self-pay | Admitting: Pharmacist

## 2016-11-21 MED ORDER — NITROFURANTOIN MONOHYD MACRO 100 MG PO CAPS: 100.0000 mg | ORAL_CAPSULE | Freq: Two times a day (BID) | ORAL | 0 refills | Status: AC

## 2016-11-21 NOTE — Telephone Encounter (Signed)
Oral Chemotherapy Pharmacist Encounter  Received notification from [facility] on 11/14/16 that patient's Sprycel would require prior authorization. PA form completed and faxed to plan on 11/14/16  Received notification from patient's insurance that PA for Sprycel 50mg  has been approved for a maximum of 12 refills Effective dates: 11/08/16-11/07/17 Request approved for 1 tablet per 1 day and must be filled through a [facility] Prescription can only be filled for a 30-day supply at a time  Forwarded [facility] PA approval, copayment $200 They will attempt to sign patient up for discount copayment card as this must be done by filling pharmacy or patient; cannot be -performed by the office.  No further needs from [facility] identified at this time. [facility] Oncology Clinic will sign off. Please let us know if we can be of assistance in the future.  Johny Drilling, PharmD, BCPS, BCOP 11/21/2016  10:10 AM Oral Oncology Clinic [phone]

## 2016-11-21 NOTE — Telephone Encounter (Signed)
Will send in macrobid    Pending UA and culture   Be cause she has no fever abd pain  Etc.   And time constraints on her  Job.

## 2016-11-21 NOTE — Telephone Encounter (Signed)
She complains of the onset last night of urgency to urinate and burning on urination. No back pain or nausea or fever. Drinking water and cranberry juice. Please call in an antibiotic to [facility] on [address]. She will provide a urine sample which can be brought in to the clinic in the morning for UA and a culture.

## 2016-11-22 ENCOUNTER — Other Ambulatory Visit (INDEPENDENT_AMBULATORY_CARE_PROVIDER_SITE_OTHER): Payer: Managed Care, Other (non HMO)

## 2016-11-22 ENCOUNTER — Other Ambulatory Visit: Payer: Self-pay | Admitting: Emergency Medicine

## 2016-11-22 DIAGNOSIS — N3000 Acute cystitis without hematuria: Secondary | ICD-10-CM

## 2016-11-22 NOTE — Telephone Encounter (Signed)
Orders are put in

## 2016-11-24 LAB — URINE CULTURE

## 2016-12-19 MED FILL — LISINOPRIL 20 MG TABLET: 20 | 90 days supply | Qty: 90 | Fill #1

## 2016-12-29 MED FILL — SPRYCEL 50 MG TABLET: 50 | 30 days supply | Qty: 30 | Fill #1

## 2017-01-19 MED FILL — SYNTHROID 150 MCG TABLET: 150 | 90 days supply | Qty: 90 | Fill #1

## 2017-01-25 ENCOUNTER — Other Ambulatory Visit: Payer: Self-pay | Admitting: Internal Medicine

## 2017-01-25 MED FILL — VERAPAMIL ER 240 MG TABLET: 240 | 90 days supply | Qty: 90 | Fill #0

## 2017-02-07 ENCOUNTER — Other Ambulatory Visit (HOSPITAL_BASED_OUTPATIENT_CLINIC_OR_DEPARTMENT_OTHER): Payer: Managed Care, Other (non HMO)

## 2017-02-07 DIAGNOSIS — C9210 Chronic myeloid leukemia, BCR/ABL-positive, not having achieved remission: Secondary | ICD-10-CM | POA: Diagnosis not present

## 2017-02-07 LAB — CBC WITH DIFFERENTIAL/PLATELET
BASO%: 1.9 % (ref 0.0–2.0)
Basophils Absolute: 0.1 10*3/uL (ref 0.0–0.1)
EOS%: 6.1 % (ref 0.0–7.0)
Eosinophils Absolute: 0.4 10*3/uL (ref 0.0–0.5)
HCT: 38.3 % (ref 34.8–46.6)
HGB: 13.2 g/dL (ref 11.6–15.9)
LYMPH%: 30.1 % (ref 14.0–49.7)
MCH: 32.1 pg (ref 25.1–34.0)
MCHC: 34.4 g/dL (ref 31.5–36.0)
MCV: 93.5 fL (ref 79.5–101.0)
MONO#: 0.6 10*3/uL (ref 0.1–0.9)
MONO%: 9.2 % (ref 0.0–14.0)
NEUT%: 52.7 % (ref 38.4–76.8)
NEUTROS ABS: 3.2 10*3/uL (ref 1.5–6.5)
PLATELETS: 263 10*3/uL (ref 145–400)
RBC: 4.1 10*6/uL (ref 3.70–5.45)
RDW: 13.2 % (ref 11.2–14.5)
WBC: 6 10*3/uL (ref 3.9–10.3)
lymph#: 1.8 10*3/uL (ref 0.9–3.3)

## 2017-02-07 LAB — COMPREHENSIVE METABOLIC PANEL
ALBUMIN: 3.8 g/dL (ref 3.5–5.0)
ALK PHOS: 55 U/L (ref 40–150)
ALT: 36 U/L (ref 0–55)
AST: 25 U/L (ref 5–34)
Anion Gap: 9 mEq/L (ref 3–11)
BILIRUBIN TOTAL: 0.31 mg/dL (ref 0.20–1.20)
BUN: 15.5 mg/dL (ref 7.0–26.0)
CO2: 23 meq/L (ref 22–29)
CREATININE: 0.9 mg/dL (ref 0.6–1.1)
Calcium: 9.5 mg/dL (ref 8.4–10.4)
Chloride: 105 mEq/L (ref 98–109)
EGFR: 70 mL/min/{1.73_m2} — ABNORMAL LOW (ref >90)
GLUCOSE: 85 mg/dL (ref 70–140)
Potassium: 4.2 mEq/L (ref 3.5–5.1)
SODIUM: 138 meq/L (ref 136–145)
TOTAL PROTEIN: 7 g/dL (ref 6.4–8.3)

## 2017-02-15 ENCOUNTER — Ambulatory Visit (HOSPITAL_BASED_OUTPATIENT_CLINIC_OR_DEPARTMENT_OTHER): Payer: Managed Care, Other (non HMO) | Admitting: Hematology and Oncology

## 2017-02-15 ENCOUNTER — Telehealth: Payer: Self-pay | Admitting: Hematology and Oncology

## 2017-02-15 DIAGNOSIS — C9210 Chronic myeloid leukemia, BCR/ABL-positive, not having achieved remission: Secondary | ICD-10-CM | POA: Diagnosis not present

## 2017-02-15 MED FILL — SPRYCEL 50 MG TABLET: 50 | 30 days supply | Qty: 30 | Fill #2

## 2017-02-15 MED FILL — PHENTERMINE 30 MG CAPSULE: 30 | 30 days supply | Qty: 30 | Fill #1

## 2017-02-15 NOTE — Telephone Encounter (Signed)
NO 7/11 LOS

## 2017-02-17 ENCOUNTER — Encounter: Payer: Self-pay | Admitting: Hematology and Oncology

## 2017-02-17 NOTE — Progress Notes (Signed)
[facility] OFFICE PROGRESS NOTE  Patient Care Team: Panosh, Wanda K, MD as PCP - General Balan, Bindubal, MD (Endocrinology) Anderson, Anthony, MD (Internal Medicine) Roberts, Angela, MD as Attending Physician (Obstetrics and Gynecology)  SUMMARY OF ONCOLOGIC HISTORY:   CML (chronic myeloid leukemia) (HCC)   05/15/2015 Bone Marrow Biopsy    BM biopsy and FISH confirmed CML [id number]      05/15/2015 - 05/20/2015 Chemotherapy    She started taking Hydrea 1500 mg daily      05/15/2015 Tumor Marker    BCR/ABL b2a2 87.5%, IS 49%      05/21/2015 -  Chemotherapy    She started taking Sprycel 100 mg daily      06/29/2015 Adverse Reaction    She had diarrhea. Dose of Srycel is reduced to 50 mg daily      09/10/2015 Tumor Marker    BCR/ABL b2a2 0.21%, IS 0.1743%      12/11/2015 Pathology Results    BCR/ABL b2a2 0.19%, IS 0.1577%      04/13/2016 Pathology Results    BCR/ABL b2a2 0.29%, IS 0.2407%      07/14/2016 Pathology Results    BCR/ABL b2a2 0.004%, IS 0.0092%. She is in MMR      07/21/2016 Miscellaneous    Dose of Sprycel is reduced to 50 mg daily      09/08/2016 Pathology Results    BCR/ABL b2a2 0.001%, IS 0.0023%. She is in MMR      11/01/2016 Pathology Results    BCR/ABL b2a2 0.001%, IS 0.0023%. She is in MMR       INTERVAL HISTORY: Please see below for problem oriented charting. She returns for further follow-up She denies recent infection No chest pain, shortness of breath for fluid retention She is compliant taking Sprycel as prescribed  REVIEW OF SYSTEMS:   Constitutional: Denies fevers, chills or abnormal weight loss Eyes: Denies blurriness of vision Ears, nose, mouth, throat, and face: Denies mucositis or sore throat Respiratory: Denies cough, dyspnea or wheezes Cardiovascular: Denies palpitation, chest discomfort or lower extremity swelling Gastrointestinal:  Denies nausea, heartburn or change in bowel habits Skin: Denies abnormal skin  rashes Lymphatics: Denies new lymphadenopathy or easy bruising Neurological:Denies numbness, tingling or new weaknesses Behavioral/Psych: Mood is stable, no new changes  All other systems were reviewed with the patient and are negative.  I have reviewed the past medical history, past surgical history, social history and family history with the patient and they are unchanged from previous note.  ALLERGIES:  has No Known Allergies.  MEDICATIONS:  Current Outpatient Prescriptions  Medication Sig Dispense Refill  . dasatinib (SPRYCEL) 50 MG tablet Take 1 tablet (50 mg total) by mouth daily. 90 tablet 3  . lisinopril (PRINIVIL,ZESTRIL) 20 MG tablet Take 1 tablet (20 mg total) by mouth daily. 90 tablet 3  . phentermine 30 MG capsule Take 1 capsule (30 mg total) by mouth every morning. 30 capsule 1  . SYNTHROID 150 MCG tablet Take 1 tablet (150 mcg total) by mouth daily before breakfast. 90 tablet 3  . verapamil (CALAN-SR) 240 MG CR tablet TAKE 1 TABLET BY MOUTH AT BEDTIME 90 tablet 1  . zolpidem (AMBIEN) 10 MG tablet Take 1 tablet (10 mg total) by mouth at bedtime as needed for sleep. 90 tablet 1   No current facility-administered medications for this visit.     PHYSICAL EXAMINATION: ECOG PERFORMANCE STATUS: 0 - Asymptomatic  Vitals:   02/15/17 0813  BP: (!) 104/56  Pulse: 77    Resp: 18  Temp: 98 F (36.7 C)   Filed Weights   02/15/17 0813  Weight: 179 lb 8 oz (81.4 kg)    GENERAL:alert, no distress and comfortable SKIN: skin color, texture, turgor are normal, no rashes or significant lesions EYES: normal, Conjunctiva are pink and non-injected, sclera clear OROPHARYNX:no exudate, no erythema and lips, buccal mucosa, and tongue normal  NECK: supple, thyroid normal size, non-tender, without nodularity LYMPH:  no palpable lymphadenopathy in the cervical, axillary or inguinal LUNGS: clear to auscultation and percussion with normal breathing effort HEART: regular rate & rhythm and  no murmurs and no lower extremity edema ABDOMEN:abdomen soft, non-tender and normal bowel sounds Musculoskeletal:no cyanosis of digits and no clubbing  NEURO: alert & oriented x 3 with fluent speech, no focal motor/sensory deficits  LABORATORY DATA:  I have reviewed the data as listed    Component Value Date/Time   NA 138 02/07/2017 0836   K 4.2 02/07/2017 0836   CL 106 06/17/2016 1125   CO2 23 02/07/2017 0836   GLUCOSE 85 02/07/2017 0836   BUN 15.5 02/07/2017 0836   CREATININE 0.9 02/07/2017 0836   CALCIUM 9.5 02/07/2017 0836   PROT 7.0 02/07/2017 0836   ALBUMIN 3.8 02/07/2017 0836   AST 25 02/07/2017 0836   ALT 36 02/07/2017 0836   ALKPHOS 55 02/07/2017 0836   BILITOT 0.31 02/07/2017 0836   GFRNONAA >60 06/17/2016 1125   GFRAA >60 06/17/2016 1125    No results found for: SPEP, UPEP  Lab Results  Component Value Date   WBC 6.0 02/07/2017   NEUTROABS 3.2 02/07/2017   HGB 13.2 02/07/2017   HCT 38.3 02/07/2017   MCV 93.5 02/07/2017   PLT 263 02/07/2017      Chemistry      Component Value Date/Time   NA 138 02/07/2017 0836   K 4.2 02/07/2017 0836   CL 106 06/17/2016 1125   CO2 23 02/07/2017 0836   BUN 15.5 02/07/2017 0836   CREATININE 0.9 02/07/2017 0836      Component Value Date/Time   CALCIUM 9.5 02/07/2017 0836   ALKPHOS 55 02/07/2017 0836   AST 25 02/07/2017 0836   ALT 36 02/07/2017 0836   BILITOT 0.31 02/07/2017 0836      ASSESSMENT & PLAN:  CML (chronic myeloid leukemia) (HCC) The patient has achieved major molecular response. I do not believe Sprycel is the cause of her hematoma. I recommend continue on reduced dose Sprycel 50 mg daily I will see her back in 4 months to repeat blood work as well.   No orders of the defined types were placed in this encounter.  All questions were answered. The patient knows to call the clinic with any problems, questions or concerns. No barriers to learning was detected. I spent 10 minutes counseling the patient  face to face. The total time spent in the appointment was 15 minutes and more than 50% was on counseling and review of test results     Heath Lark, MD 02/17/2017 3:27 PM

## 2017-02-17 NOTE — Assessment & Plan Note (Signed)
The patient has achieved major molecular response. I do not believe Sprycel is the cause of her hematoma. I recommend continue on reduced dose Sprycel 50 mg daily I will see her back in 4 months to repeat blood work as well.

## 2017-02-27 ENCOUNTER — Telehealth: Payer: Self-pay | Admitting: Family Medicine

## 2017-02-27 NOTE — Telephone Encounter (Signed)
error 

## 2017-03-22 MED FILL — LISINOPRIL 20 MG TAB: 20 | 90 days supply | Qty: 90 | Fill #2

## 2017-03-28 ENCOUNTER — Other Ambulatory Visit: Payer: Self-pay | Admitting: Internal Medicine

## 2017-03-28 DIAGNOSIS — Z1231 Encounter for screening mammogram for malignant neoplasm of breast: Secondary | ICD-10-CM

## 2017-04-11 MED FILL — SPRYCEL 50 MG TABLET: 50 | 30 days supply | Qty: 30 | Fill #3

## 2017-04-19 ENCOUNTER — Ambulatory Visit: Payer: Managed Care, Other (non HMO) | Admitting: Internal Medicine

## 2017-04-23 ENCOUNTER — Other Ambulatory Visit: Payer: Self-pay | Admitting: Internal Medicine

## 2017-04-24 MED FILL — VERAPAMIL ER 240 MG TABLET: 240 | 90 days supply | Qty: 90 | Fill #1

## 2017-04-24 MED FILL — SYNTHROID 150 MCG TABLET: 150 | 90 days supply | Qty: 90 | Fill #2

## 2017-04-24 NOTE — Telephone Encounter (Signed)
Ok to refill x 1  

## 2017-04-24 NOTE — Telephone Encounter (Signed)
Please refill the Zolpidem. She has a follow up visit with Dr. Regis Bill on 05-31-17.

## 2017-05-03 DIAGNOSIS — L814 Other melanin hyperpigmentation: Secondary | ICD-10-CM | POA: Diagnosis not present

## 2017-05-03 DIAGNOSIS — L820 Inflamed seborrheic keratosis: Secondary | ICD-10-CM | POA: Diagnosis not present

## 2017-05-03 DIAGNOSIS — D225 Melanocytic nevi of trunk: Secondary | ICD-10-CM | POA: Diagnosis not present

## 2017-05-03 DIAGNOSIS — D485 Neoplasm of uncertain behavior of skin: Secondary | ICD-10-CM | POA: Diagnosis not present

## 2017-05-10 ENCOUNTER — Ambulatory Visit
Admission: RE | Admit: 2017-05-10 | Discharge: 2017-05-10 | Disposition: A | Discharge status: Home / Self Care | Payer: Managed Care, Other (non HMO) | Source: Ambulatory Visit | Attending: Internal Medicine | Admitting: Internal Medicine

## 2017-05-10 DIAGNOSIS — Z1231 Encounter for screening mammogram for malignant neoplasm of breast: Secondary | ICD-10-CM

## 2017-05-10 IMAGING — MG 2D DIGITAL SCREENING BILATERAL MAMMOGRAM WITH CAD AND ADJUNCT TO
8 of 13 series · 8 of 29 positions shown · non-contrast
Comparison: Previous exam(s).

CLINICAL DATA: Screening.

EXAM:
2D DIGITAL SCREENING BILATERAL MAMMOGRAM WITH CAD AND ADJUNCT TOMO

[L MLO (1 of 2)]
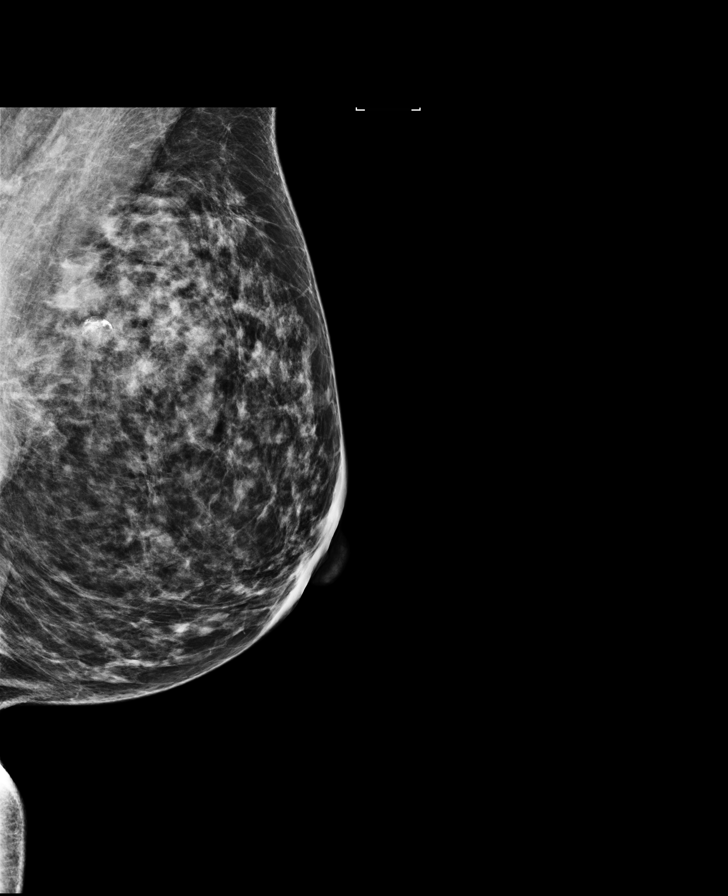

[L CC synth-2D]
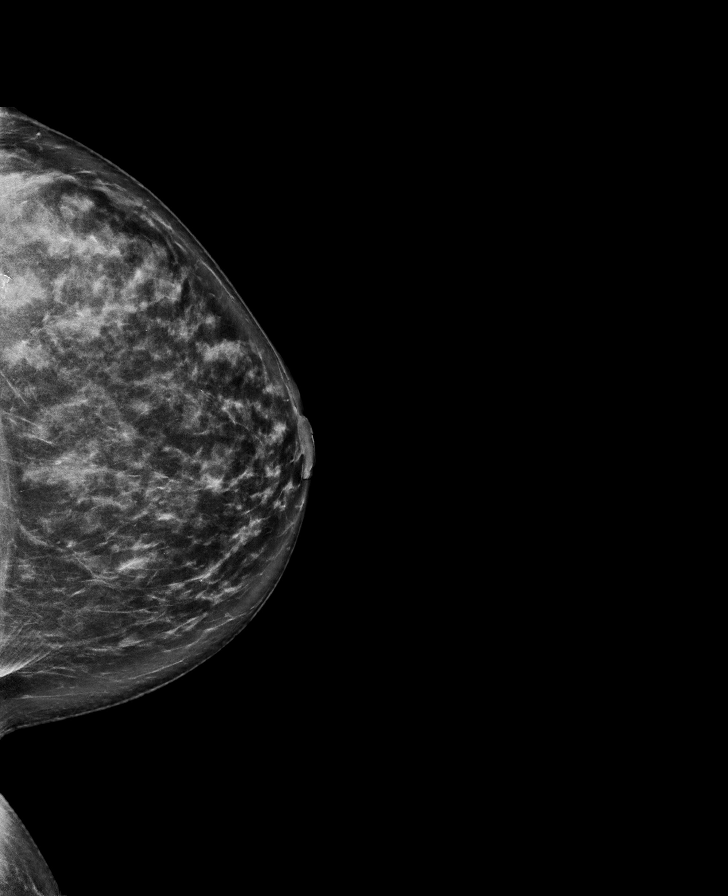

[R MLO synth-2D]
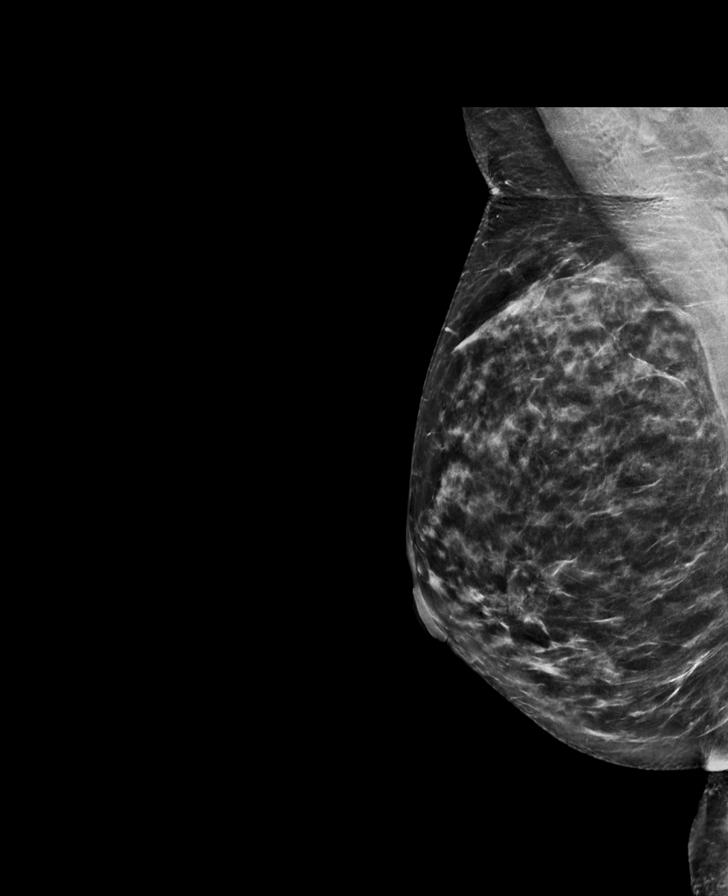

[R CC]
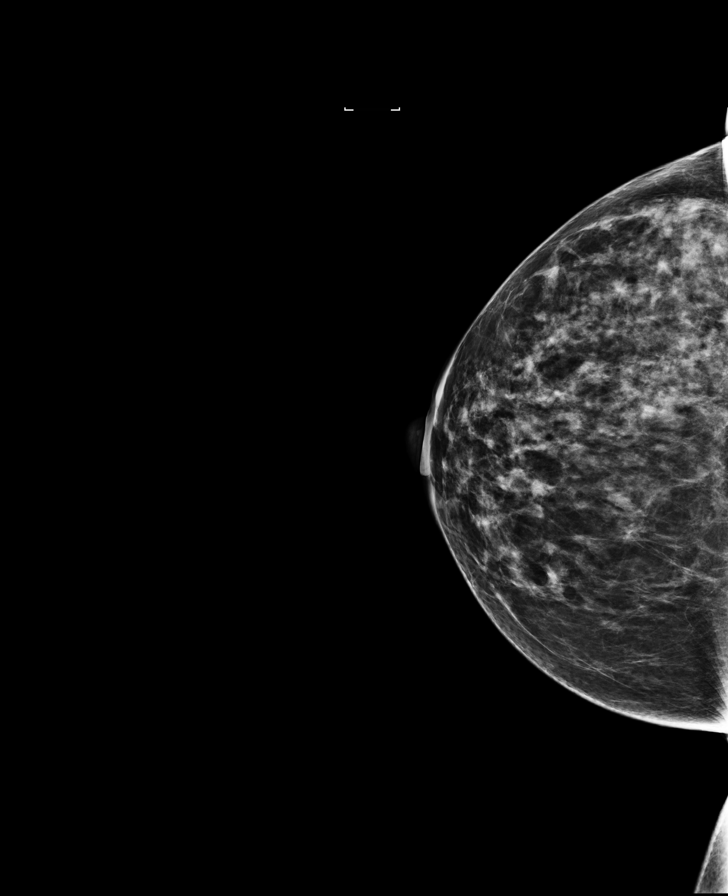

[L MLO synth-2D]
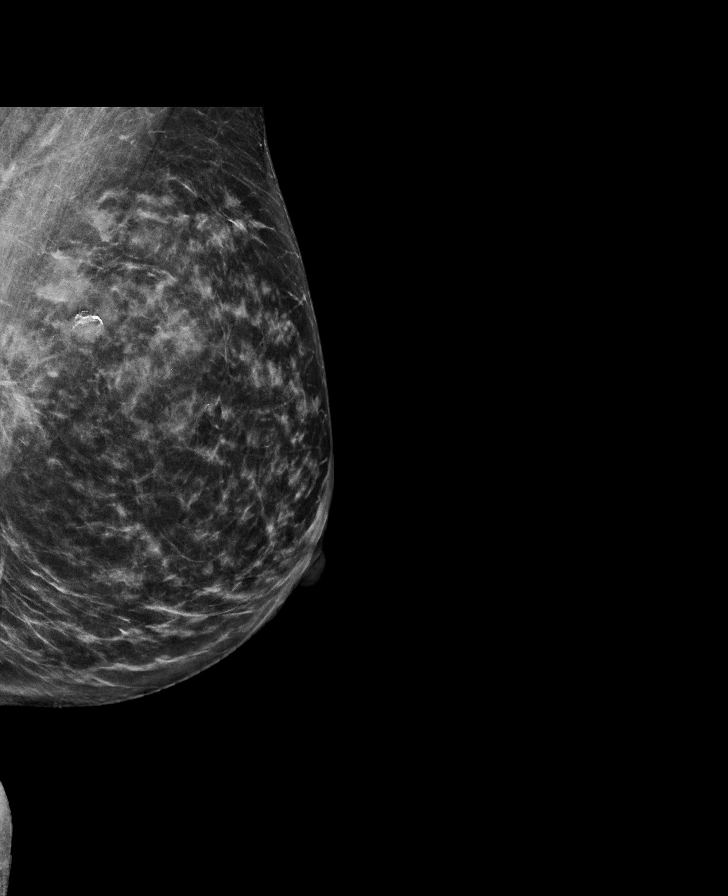

[L MLO (2 of 2)]
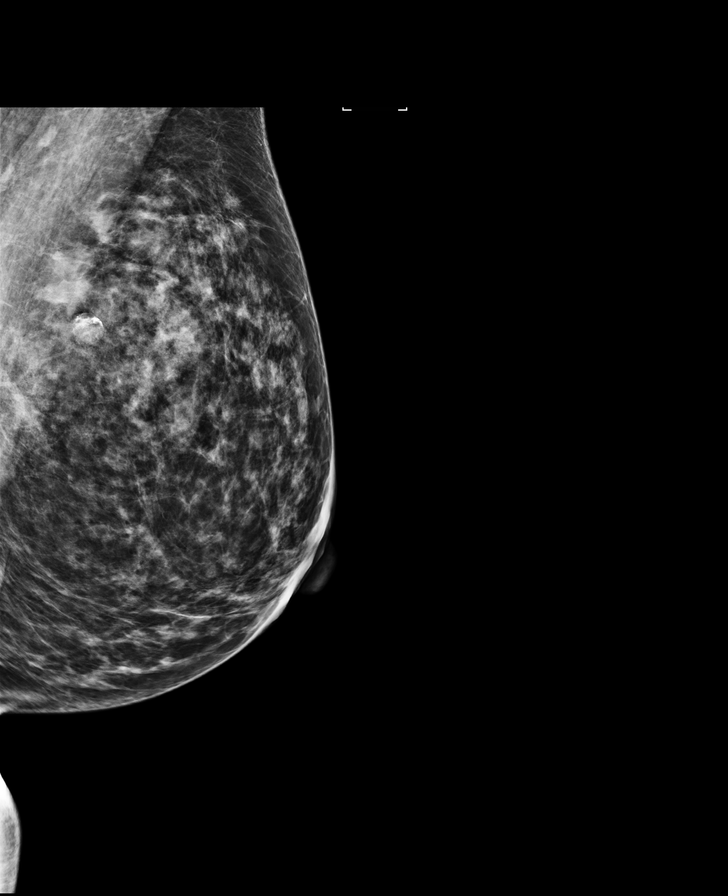

[R MLO]
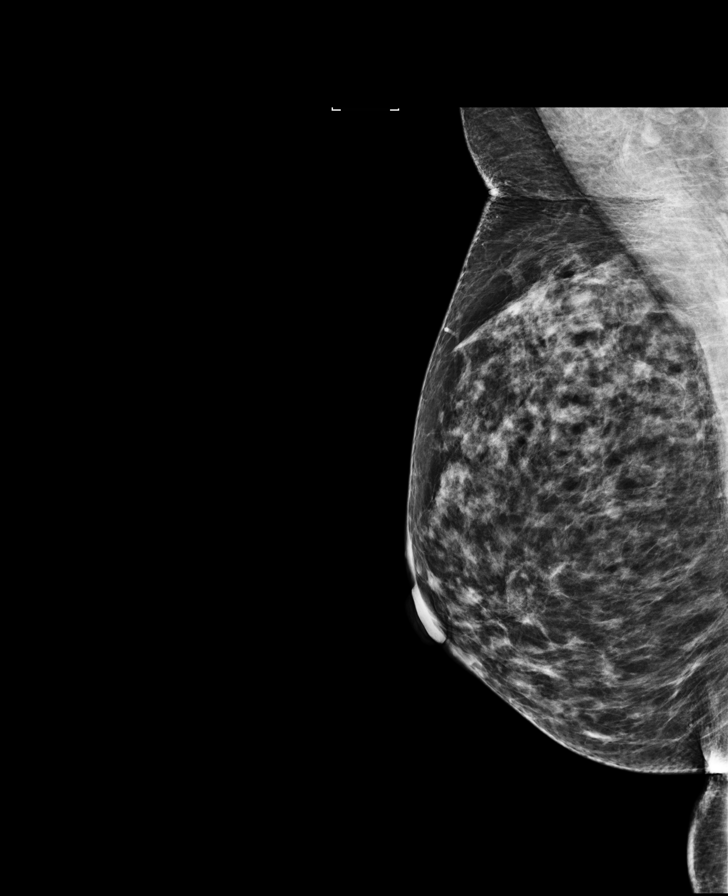

[R CC synth-2D]
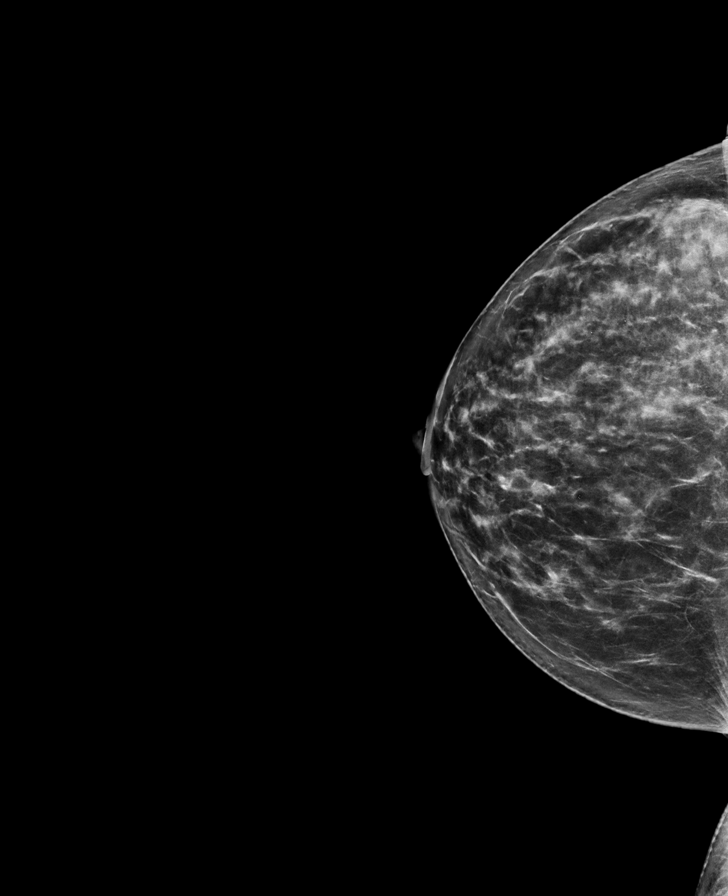

[8 of 29 positions shown; findings below may reference images not displayed]

ACR Breast Density Category c: The breast tissue is heterogeneously
dense, which may obscure small masses.
FINDINGS: There are no findings suspicious for malignancy. Images were
processed with CAD.
IMPRESSION: No mammographic evidence of malignancy. A result letter of this
screening mammogram will be mailed directly to the patient.

RECOMMENDATION:
Screening mammogram in one year. (Code:[TA])

BI-RADS CATEGORY  1: Negative.

## 2017-05-12 MED FILL — SPRYCEL 50 MG TABLET: 50 | 30 days supply | Qty: 30 | Fill #4

## 2017-05-24 DIAGNOSIS — Z23 Encounter for immunization: Secondary | ICD-10-CM

## 2017-05-25 ENCOUNTER — Other Ambulatory Visit (INDEPENDENT_AMBULATORY_CARE_PROVIDER_SITE_OTHER): Payer: Managed Care, Other (non HMO) | Admitting: Internal Medicine

## 2017-05-25 DIAGNOSIS — Z23 Encounter for immunization: Secondary | ICD-10-CM

## 2017-05-31 ENCOUNTER — Ambulatory Visit: Payer: Managed Care, Other (non HMO) | Admitting: Internal Medicine

## 2017-05-31 ENCOUNTER — Telehealth: Payer: Self-pay | Admitting: Family Medicine

## 2017-05-31 MED ORDER — ZOSTER VAC RECOMB ADJUVANTED 50 MCG/0.5ML IM SUSR: 0.5000 mL | Freq: Once | INTRAMUSCULAR | 1 refills | Status: AC

## 2017-05-31 NOTE — Telephone Encounter (Signed)
Please give her a rx for Shingrix for her to take to the pharmacist

## 2017-05-31 NOTE — Telephone Encounter (Signed)
Printed and given to spouse.

## 2017-06-07 ENCOUNTER — Ambulatory Visit: Payer: Managed Care, Other (non HMO) | Admitting: Internal Medicine

## 2017-06-07 MED FILL — SHINGRIX VIAL KIT: 50 | 1 days supply | Qty: 1 | Fill #0

## 2017-06-12 MED FILL — SPRYCEL 50 MG TABLET: 50 | 30 days supply | Qty: 30 | Fill #5

## 2017-06-14 ENCOUNTER — Other Ambulatory Visit (HOSPITAL_BASED_OUTPATIENT_CLINIC_OR_DEPARTMENT_OTHER): Payer: Managed Care, Other (non HMO)

## 2017-06-14 DIAGNOSIS — H524 Presbyopia: Secondary | ICD-10-CM | POA: Diagnosis not present

## 2017-06-14 DIAGNOSIS — H5213 Myopia, bilateral: Secondary | ICD-10-CM | POA: Diagnosis not present

## 2017-06-14 DIAGNOSIS — C9210 Chronic myeloid leukemia, BCR/ABL-positive, not having achieved remission: Secondary | ICD-10-CM

## 2017-06-14 LAB — CBC WITH DIFFERENTIAL/PLATELET
BASO%: 1.2 % (ref 0.0–2.0)
Basophils Absolute: 0.1 10*3/uL (ref 0.0–0.1)
EOS ABS: 0.2 10*3/uL (ref 0.0–0.5)
EOS%: 2.5 % (ref 0.0–7.0)
HEMATOCRIT: 38.6 % (ref 34.8–46.6)
HGB: 13.2 g/dL (ref 11.6–15.9)
LYMPH#: 2.2 10*3/uL (ref 0.9–3.3)
LYMPH%: 28.8 % (ref 14.0–49.7)
MCH: 31.4 pg (ref 25.1–34.0)
MCHC: 34.2 g/dL (ref 31.5–36.0)
MCV: 91.9 fL (ref 79.5–101.0)
MONO#: 0.6 10*3/uL (ref 0.1–0.9)
MONO%: 7.8 % (ref 0.0–14.0)
NEUT#: 4.6 10*3/uL (ref 1.5–6.5)
NEUT%: 59.7 % (ref 38.4–76.8)
PLATELETS: 321 10*3/uL (ref 145–400)
RBC: 4.2 10*6/uL (ref 3.70–5.45)
RDW: 13.2 % (ref 11.2–14.5)
WBC: 7.8 10*3/uL (ref 3.9–10.3)

## 2017-06-21 ENCOUNTER — Ambulatory Visit (HOSPITAL_BASED_OUTPATIENT_CLINIC_OR_DEPARTMENT_OTHER): Payer: Managed Care, Other (non HMO) | Admitting: Hematology and Oncology

## 2017-06-21 ENCOUNTER — Encounter: Payer: Self-pay | Admitting: Hematology and Oncology

## 2017-06-21 VITALS — BP 112/62 | HR 74 | Temp 98.0°F | Resp 18 | Ht 68.0 in | Wt 177.4 lb

## 2017-06-21 DIAGNOSIS — C9210 Chronic myeloid leukemia, BCR/ABL-positive, not having achieved remission: Secondary | ICD-10-CM | POA: Diagnosis not present

## 2017-06-21 DIAGNOSIS — J986 Disorders of diaphragm: Secondary | ICD-10-CM | POA: Diagnosis not present

## 2017-06-21 MED ORDER — DASATINIB 50 MG PO TABS: 50.0000 mg | ORAL_TABLET | Freq: Every day | ORAL | 3 refills | Status: DC

## 2017-06-21 NOTE — Progress Notes (Signed)
[place] OFFICE PROGRESS NOTE  Patient Care Team: Panosh, Standley Brooking, MD as PCP - General [name], MD (Endocrinology) [name], MD (Internal Medicine) Everett Graff, MD as Attending Physician (Obstetrics and Gynecology)  SUMMARY OF ONCOLOGIC HISTORY:   CML (chronic myeloid leukemia) ([place])   05/15/2015 Bone Marrow Biopsy    BM biopsy and FISH confirmed CML [id number]      05/15/2015 - 05/20/2015 Chemotherapy    She started taking Hydrea 1500 mg daily      05/15/2015 Tumor Marker    BCR/ABL b2a2 87.5%, IS 49%      05/21/2015 -  Chemotherapy    She started taking Sprycel 100 mg daily      06/29/2015 Adverse Reaction    She had diarrhea. Dose of Srycel is reduced to 50 mg daily      09/10/2015 Tumor Marker    BCR/ABL b2a2 0.21%, IS 0.1743%      12/11/2015 Pathology Results    BCR/ABL b2a2 0.19%, IS 0.1577%      04/13/2016 Pathology Results    BCR/ABL b2a2 0.29%, IS 0.2407%      07/14/2016 Pathology Results    BCR/ABL b2a2 0.004%, IS 0.0092%. She is in MMR      07/21/2016 Miscellaneous    Dose of Sprycel is reduced to 50 mg daily      09/08/2016 Pathology Results    BCR/ABL b2a2 0.001%, IS 0.0023%. She is in MMR      11/01/2016 Pathology Results    BCR/ABL b2a2 0.001%, IS 0.0023%. She is in MMR      06/14/2017 Pathology Results    BCR/ABL not detected. She is in MMR       INTERVAL HISTORY: Please see below for problem oriented charting. She returns for further follow-up She denies side effects from Sprycel No recent bleeding No recent infection She denies shortness of breath, fluid retention or diarrhea. Overall, she tolerated treatment very well. She is requesting 90-day supply of medications  REVIEW OF SYSTEMS:   Constitutional: Denies fevers, chills or abnormal weight loss Eyes: Denies blurriness of vision Ears, nose, mouth, throat, and face: Denies mucositis or sore throat Respiratory: Denies cough, dyspnea or  wheezes Cardiovascular: Denies palpitation, chest discomfort or lower extremity swelling Gastrointestinal:  Denies nausea, heartburn or change in bowel habits Skin: Denies abnormal skin rashes Lymphatics: Denies new lymphadenopathy or easy bruising Neurological:Denies numbness, tingling or new weaknesses Behavioral/Psych: Mood is stable, no new changes  All other systems were reviewed with the patient and are negative.  I have reviewed the past medical history, past surgical history, social history and family history with the patient and they are unchanged from previous note.  ALLERGIES:  has No Known Allergies.  MEDICATIONS:  Current Outpatient Medications  Medication Sig Dispense Refill  . dasatinib (SPRYCEL) 50 MG tablet Take 1 tablet (50 mg total) daily by mouth. 90 tablet 3  . lisinopril (PRINIVIL,ZESTRIL) 20 MG tablet Take 1 tablet (20 mg total) by mouth daily. 90 tablet 3  . phentermine 30 MG capsule Take 1 capsule (30 mg total) by mouth every morning. 30 capsule 1  . SYNTHROID 150 MCG tablet Take 1 tablet (150 mcg total) by mouth daily before breakfast. 90 tablet 3  . verapamil (CALAN-SR) 240 MG CR tablet TAKE 1 TABLET BY MOUTH AT BEDTIME 90 tablet 1  . zolpidem (AMBIEN) 10 MG tablet TAKE 1 TABLET BY MOUTH EVERY NIGHT AT BEDTIME AS NEEDED FOR SLEEP 90 tablet 0   No current  facility-administered medications for this visit.     PHYSICAL EXAMINATION: ECOG PERFORMANCE STATUS: 0 - Asymptomatic  Vitals:   06/21/17 0859  BP: 112/62  Pulse: 74  Resp: 18  Temp: 98 F (36.7 C)  SpO2: 100%   Filed Weights   06/21/17 0859  Weight: 177 lb 6.4 oz (80.5 kg)    GENERAL:alert, no distress and comfortable SKIN: skin color, texture, turgor are normal, no rashes or significant lesions EYES: normal, Conjunctiva are pink and non-injected, sclera clear OROPHARYNX:no exudate, no erythema and lips, buccal mucosa, and tongue normal  NECK: supple, thyroid normal size, non-tender, without  nodularity LYMPH:  no palpable lymphadenopathy in the cervical, axillary or inguinal LUNGS: clear to auscultation and percussion with normal breathing effort HEART: regular rate & rhythm and no murmurs and no lower extremity edema ABDOMEN:abdomen soft, non-tender and normal bowel sounds Musculoskeletal:no cyanosis of digits and no clubbing  NEURO: alert & oriented x 3 with fluent speech, no focal motor/sensory deficits  LABORATORY DATA:  I have reviewed the data as listed    Component Value Date/Time   NA 138 02/07/2017 0836   K 4.2 02/07/2017 0836   CL 106 06/17/2016 1125   CO2 23 02/07/2017 0836   GLUCOSE 85 02/07/2017 0836   BUN 15.5 02/07/2017 0836   CREATININE 0.9 02/07/2017 0836   CALCIUM 9.5 02/07/2017 0836   PROT 7.0 02/07/2017 0836   ALBUMIN 3.8 02/07/2017 0836   AST 25 02/07/2017 0836   ALT 36 02/07/2017 0836   ALKPHOS 55 02/07/2017 0836   BILITOT 0.31 02/07/2017 0836   GFRNONAA >60 06/17/2016 1125   GFRAA >60 06/17/2016 1125    No results found for: SPEP, UPEP  Lab Results  Component Value Date   WBC 7.8 06/14/2017   NEUTROABS 4.6 06/14/2017   HGB 13.2 06/14/2017   HCT 38.6 06/14/2017   MCV 91.9 06/14/2017   PLT 321 06/14/2017      Chemistry      Component Value Date/Time   NA 138 02/07/2017 0836   K 4.2 02/07/2017 0836   CL 106 06/17/2016 1125   CO2 23 02/07/2017 0836   BUN 15.5 02/07/2017 0836   CREATININE 0.9 02/07/2017 0836      Component Value Date/Time   CALCIUM 9.5 02/07/2017 0836   ALKPHOS 55 02/07/2017 0836   AST 25 02/07/2017 0836   ALT 36 02/07/2017 0836   BILITOT 0.31 02/07/2017 0836     ASSESSMENT & PLAN:  CML (chronic myeloid leukemia) (HCC) The patient has achieved major molecular response. I do not believe Sprycel is the cause of her hematoma/bleeding We discussed other options but for now, she is comfortable to continue on reduced dose Sprycel 50 mg daily If the patient is interested to stop Sprycel I told her, she needs to  document at least 2 years of MMR before consideration to stop treatment I would discuss with her for the next year I will see her back in 4 months to repeat blood work as well.  Elevated diaphragm  She has chronic elevated right diaphragm. Examination showed persistent reduced breath sounds but she is not symptomatic. Observe only.   Orders Placed This Encounter  Procedures  . Comprehensive metabolic panel    Standing Status:   Standing    Number of Occurrences:   2    Standing Expiration Date:   06/21/2018   All questions were answered. The patient knows to call the clinic with any problems, questions or concerns. No barriers to  learning was detected. I spent 10 minutes counseling the patient face to face. The total time spent in the appointment was 15 minutes and more than 50% was on counseling and review of test results     Heath Lark, MD 06/21/2017 10:21 AM

## 2017-06-21 NOTE — Assessment & Plan Note (Addendum)
The patient has achieved major molecular response. I do not believe Sprycel is the cause of her hematoma/bleeding We discussed other options but for now, she is comfortable to continue on reduced dose Sprycel 50 mg daily If the patient is interested to stop Sprycel I told her, she needs to document at least 2 years of MMR before consideration to stop treatment I would discuss with her for the next year I will see her back in 4 months to repeat blood work as well.

## 2017-06-21 NOTE — Assessment & Plan Note (Signed)
She has chronic elevated right diaphragm. Examination showed persistent reduced breath sounds but she is not symptomatic. Observe only.

## 2017-06-28 ENCOUNTER — Telehealth: Payer: Self-pay | Admitting: Hematology and Oncology

## 2017-06-28 NOTE — Telephone Encounter (Signed)
Scheduled appts per 11/14 sch msg - sending confirmation letter with appointment details.

## 2017-07-03 MED FILL — LISINOPRIL 20 MG TABS: 20 | 90 days supply | Qty: 90 | Fill #3

## 2017-07-17 MED FILL — SPRYCEL 50 MG TABLET: 50 | 30 days supply | Qty: 30 | Fill #6

## 2017-07-23 ENCOUNTER — Other Ambulatory Visit: Payer: Self-pay | Admitting: Internal Medicine

## 2017-07-24 ENCOUNTER — Other Ambulatory Visit: Payer: Self-pay | Admitting: Internal Medicine

## 2017-07-24 MED FILL — VERAPAMIL ER 240 MG TABLET: 240 | 90 days supply | Qty: 90 | Fill #0

## 2017-07-24 NOTE — Telephone Encounter (Signed)
Okay per Dr Regis Bill, refill x 1 Ambien called in.

## 2017-07-24 NOTE — Telephone Encounter (Signed)
Waiting on okay from Dr Regis Bill to refill.

## 2017-07-25 ENCOUNTER — Other Ambulatory Visit: Payer: Self-pay | Admitting: Adult Health

## 2017-07-25 ENCOUNTER — Telehealth: Payer: Self-pay | Admitting: Family Medicine

## 2017-07-25 MED ORDER — ZOLPIDEM TARTRATE 10 MG PO TABS: 10.0000 mg | ORAL_TABLET | Freq: Every evening | ORAL | 0 refills | Status: DC | PRN

## 2017-07-25 MED ORDER — VERAPAMIL HCL ER 240 MG PO TBCR: 240.0000 mg | EXTENDED_RELEASE_TABLET | Freq: Every day | ORAL | 0 refills | Status: DC

## 2017-07-25 NOTE — Telephone Encounter (Signed)
Please send in 90 day refills for Verapamil and Zolpidem to [place] since Dr. Regis Bill is out of the office

## 2017-08-09 MED FILL — SHINGRIX 50 MCG SUS: 50 | 1 days supply | Qty: 1 | Fill #1

## 2017-08-09 MED FILL — SYNTHROID 150 MCG TABLET: 150 | 90 days supply | Qty: 90 | Fill #3

## 2017-08-21 MED FILL — SPRYCEL 50 MG TABLET: 50 | 30 days supply | Qty: 30 | Fill #7

## 2017-09-18 MED FILL — SPRYCEL 50 MG TABLET: 50 | 30 days supply | Qty: 30 | Fill #8

## 2017-09-26 NOTE — Progress Notes (Signed)
Chief Complaint  Patient presents with  . Follow-up    No new concerns. Last seen 2017    HPI: Christina [name] 57 y.o. come in for Chronic disease management     CML stable   Remission may go off med in near future   BP controlled   New cough since x mas no sob hemoptysis   On going and   Persistent   No fever    Cp .  No sig HAs    Sleep  Taking ambien for a long time and helps fall asleep ocass awakenings  Knows needs colonoscopy   Delayed cause of  Work transportation issue.  Neg fam hx of colon cancer   Not sure when pap was done ? Dr Mancel Bale?   Good adherence to thyroid med daily .   ROS: See pertinent positives and negatives per HPI.  Past Medical History:  Diagnosis Date  . Allergic rhinitis   . Fibroid   . Fibroids   . Grave's disease   . H/O Graves' disease   . H/O insomnia   . H/O menorrhagia 2012  . History of chicken pox   . Hypertension   . Hyperthyroidism    s/p rai 8/06 now hypothyroid Dr. Suzette Battiest  . Hypothyroid   . Infertility, female   . Leukemia ([place])   . Menorrhagia    Had Novasure  . 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 Polyp of cervix    Endometriel polyp. JO  . Vitamin D deficiency   . Vitamin D deficiency    Past Surgical History:  Procedure Laterality Date  . BUNIONECTOMY  1986  . CRANIOTOMY N/A 06/17/2016   Procedure: RIGHT CRANIOTOMY HEMATOMA EVACUATION SUBDURAL;  Surgeon: Eustace Moore, MD;  Location: [facility];  Service: Neurosurgery;  Laterality: N/A;  . CRANIOTOMY Right 06/18/2016   Procedure: CRANIOTOMY HEMATOMA EVACUATION SUBDURAL RIGHT;  Surgeon: Eustace Moore, MD;  Location: [facility];  Service: Neurosurgery;  Laterality: Right;  . DILATION AND CURETTAGE OF UTERUS  09/15/2010  . HYSTEROSCOPY W/D&C  09/28/2010  . MOUTH SURGERY    . NOVASURE ABLATION  09/28/2010  . REFRACTIVE SURGERY     for vision  . TONSILLECTOMY  1965     Family History  Problem Relation Age of Onset  . Diabetes Mother   . Hypertension Unknown     . Prostate cancer Unknown        grandfather  . Hypertension Father   . Cancer Maternal Uncle        leukemia    Social History   Socioeconomic History  . Marital status: Married    Spouse name: None  . Number of children: None  . Years of education: None  . Highest education level: None  Social Needs  . Financial resource strain: None  . Food insecurity - worry: None  . Food insecurity - inability: None  . Transportation needs - medical: None  . Transportation needs - non-medical: None  Occupational History  . None  Tobacco Use  . Smoking status: Never Smoker  . Smokeless tobacco: Never Used  Substance and Sexual Activity  . Alcohol use: Yes    Comment: socially  . Drug use: No  . Sexual activity: Yes  Other Topics Concern  . None  Social History Narrative   Married   HH of 4   Audiologist working at [employer]    Outpatient Medications Prior to Visit  Medication Sig  Dispense Refill  . dasatinib (SPRYCEL) 50 MG tablet Take 1 tablet (50 mg total) daily by mouth. 90 tablet 3  . SYNTHROID 150 MCG tablet Take 1 tablet (150 mcg total) by mouth daily before breakfast. 90 tablet 3  . verapamil (CALAN-SR) 240 MG CR tablet Take 1 tablet (240 mg total) by mouth at bedtime. 90 tablet 0  . zolpidem (AMBIEN) 10 MG tablet Take 1 tablet (10 mg total) by mouth at bedtime as needed. for sleep 90 tablet 0  . lisinopril (PRINIVIL,ZESTRIL) 20 MG tablet Take 1 tablet (20 mg total) by mouth daily. 90 tablet 3  . phentermine 30 MG capsule Take 1 capsule (30 mg total) by mouth every morning. (Patient not taking: Reported on 09/27/2017) 30 capsule 1   No facility-administered medications prior to visit.      EXAM:  BP 110/70 (BP Location: Right Arm, Patient Position: Sitting, Cuff Size: Normal)   Pulse 74   Temp 97.6 F (36.4 C) (Oral)   Wt 179 lb 11.2 oz (81.5 kg)   BMI 27.32 kg/m   Body mass index is 27.32 kg/m.  GENERAL: vitals reviewed and listed above, alert,  oriented, appears well hydrated and in no acute distress HEENT: atraumatic, conjunctiva  clear, no obvious abnormalities on inspection of external nose and ears OP : no lesion edema or exudate  Face non tender   NECK: no obvious masses on inspection palpation   Shoddy ac nodes  Thyroid  Palpable no nodules  LUNGS: clear to auscultation bilaterally, no wheezes, rales or rhonchi, good air movement CV: HRRR, no clubbing cyanosis or  peripheral edema nl cap refill  Abdomen:  Sof,t normal bowel sounds without hepatosplenomegaly, no guarding rebound or masses no CVA tenderness MS: moves all extremities without noticeable focal  Abnormality Skin dry no acute rashes  PSYCH: pleasant and cooperative, no obvious depression or anxiety Lab Results  Component Value Date   WBC 7.8 06/14/2017   HGB 13.2 06/14/2017   HCT 38.6 06/14/2017   PLT 321 06/14/2017   GLUCOSE 85 02/07/2017   CHOL 200 05/13/2015   TRIG 366.0 (H) 05/13/2015   HDL 43.60 05/13/2015   LDLDIRECT 119.0 05/13/2015   LDLCALC 123 (H) 05/17/2010   ALT 36 02/07/2017   AST 25 02/07/2017   NA 138 02/07/2017   K 4.2 02/07/2017   CL 106 06/17/2016   CREATININE 0.9 02/07/2017   BUN 15.5 02/07/2017   CO2 23 02/07/2017   TSH 1.61 09/18/2015   INR 0.90 06/17/2016   BP Readings from Last 3 Encounters:  09/27/17 110/70  06/21/17 112/62  02/15/17 (!) 104/56    ASSESSMENT AND PLAN:  Discussed the following assessment and plan:  Hypothyroidism, unspecified type - Plan: TSH, Lipid panel  Essential hypertension - controlled  - Plan: TSH, Lipid panel  Medication management - chornic ambien  use  mid sucess wants to remain on med - Plan: TSH, Lipid panel  Cough, persistent - exam reassuring  change acei to arb and fu if persistent   Hyperlipidemia, unspecified hyperlipidemia type - Plan: TSH, Lipid panel  Mixed hyperlipidemia  Insomnia, unspecified type   Pap  Was 2 2015  And neg HPV ( so due 2 2020  Next pap) -Patient advised to  return or notify health care team  if  new concerns arise.  Patient Instructions    Lets change your BP medication lisinopril  To  Losartan  And see what the cough does.   If better make sure BP  is good and will refill  For a year.   If cough  not going away contact us   Lung exam is clear but may get chest x ray   To be sure.   Not sure when you are due for a pap but will rereview the record .   Get your colonscopy and check into cologuard payment otherwise.   Consideration of getting    prevnar at any time  .   And then pneumovax in a year    Health Maintenance, Female Adopting a healthy lifestyle and getting preventive care can go a long way to promote health and wellness. Talk with your health care provider about what schedule of regular examinations is right for you. This is a good chance for you to check in with your provider about disease prevention and staying healthy. In between checkups, there are plenty of things you can do on your own. Experts have done a lot of research about which lifestyle changes and preventive measures are most likely to keep you healthy. Ask your health care provider for more information. Weight and diet Eat a healthy diet  Be sure to include plenty of vegetables, fruits, low-fat dairy products, and lean protein.  Do not eat a lot of foods high in solid fats, added sugars, or salt.  Get regular exercise. This is one of the most important things you can do for your health. ? Most adults should exercise for at least 150 minutes each week. The exercise should increase your heart rate and make you sweat (moderate-intensity exercise). ? Most adults should also do strengthening exercises at least twice a week. This is in addition to the moderate-intensity exercise.  Maintain a healthy weight  Body mass index (BMI) is a measurement that can be used to identify possible weight problems. It estimates body fat based on height and weight. Your health care  provider can help determine your BMI and help you achieve or maintain a healthy weight.  For females 51 years of age and older: ? A BMI below 18.5 is considered underweight. ? A BMI of 18.5 to 24.9 is normal. ? A BMI of 25 to 29.9 is considered overweight. ? A BMI of 30 and above is considered obese.  Watch levels of cholesterol and blood lipids  You should start having your blood tested for lipids and cholesterol at 58 years of age, then have this test every 5 years.  You may need to have your cholesterol levels checked more often if: ? Your lipid or cholesterol levels are high. ? You are older than 58 years of age. ? You are at high risk for heart disease.  Cancer screening Lung Cancer  Lung cancer screening is recommended for adults 72-13 years old who are at high risk for lung cancer because of a history of smoking.  A yearly low-dose CT scan of the lungs is recommended for people who: ? Currently smoke. ? Have quit within the past 15 years. ? Have at least a 30-pack-year history of smoking. A pack year is smoking an average of one pack of cigarettes a day for 1 year.  Yearly screening should continue until it has been 15 years since you quit.  Yearly screening should stop if you develop a health problem that would prevent you from having lung cancer treatment.  Breast Cancer  Practice breast self-awareness. This means understanding how your breasts normally appear and feel.  It also means doing regular breast self-exams. Let your health  care provider know about any changes, no matter how small.  If you are in your 20s or 30s, you should have a clinical breast exam (CBE) by a health care provider every 1-3 years as part of a regular health exam.  If you are 68 or older, have a CBE every year. Also consider having a breast X-ray (mammogram) every year.  If you have a family history of breast cancer, talk to your health care provider about genetic screening.  If you are  at high risk for breast cancer, talk to your health care provider about having an MRI and a mammogram every year.  Breast cancer gene (BRCA) assessment is recommended for women who have family members with BRCA-related cancers. BRCA-related cancers include: ? Breast. ? Ovarian. ? Tubal. ? Peritoneal cancers.  Results of the assessment will determine the need for genetic counseling and BRCA1 and BRCA2 testing.  Cervical Cancer Your health care provider may recommend that you be screened regularly for cancer of the pelvic organs (ovaries, uterus, and vagina). This screening involves a pelvic examination, including checking for microscopic changes to the surface of your cervix (Pap test). You may be encouraged to have this screening done every 3 years, beginning at age 70.  For women ages 55-65, health care providers may recommend pelvic exams and Pap testing every 3 years, or they may recommend the Pap and pelvic exam, combined with testing for human papilloma virus (HPV), every 5 years. Some types of HPV increase your risk of cervical cancer. Testing for HPV may also be done on women of any age with unclear Pap test results.  Other health care providers may not recommend any screening for nonpregnant women who are considered low risk for pelvic cancer and who do not have symptoms. Ask your health care provider if a screening pelvic exam is right for you.  If you have had past treatment for cervical cancer or a condition that could lead to cancer, you need Pap tests and screening for cancer for at least 20 years after your treatment. If Pap tests have been discontinued, your risk factors (such as having a new sexual partner) need to be reassessed to determine if screening should resume. Some women have medical problems that increase the chance of getting cervical cancer. In these cases, your health care provider may recommend more frequent screening and Pap tests.  Colorectal Cancer  This type of  cancer can be detected and often prevented.  Routine colorectal cancer screening usually begins at 58 years of age and continues through 58 years of age.  Your health care provider may recommend screening at an earlier age if you have risk factors for colon cancer.  Your health care provider may also recommend using home test kits to check for hidden blood in the stool.  A small camera at the end of a tube can be used to examine your colon directly (sigmoidoscopy or colonoscopy). This is done to check for the earliest forms of colorectal cancer.  Routine screening usually begins at age 51.  Direct examination of the colon should be repeated every 5-10 years through 58 years of age. However, you may need to be screened more often if early forms of precancerous polyps or small growths are found.  Skin Cancer  Check your skin from head to toe regularly.  Tell your health care provider about any new moles or changes in moles, especially if there is a change in a mole's shape or color.  Also tell  your health care provider if you have a mole that is larger than the size of a pencil eraser.  Always use sunscreen. Apply sunscreen liberally and repeatedly throughout the day.  Protect yourself by wearing long sleeves, pants, a wide-brimmed hat, and sunglasses whenever you are outside.  Heart disease, diabetes, and high blood pressure  High blood pressure causes heart disease and increases the risk of stroke. High blood pressure is more likely to develop in: ? People who have blood pressure in the high end of the normal range (130-139/85-89 mm Hg). ? People who are overweight or obese. ? People who are African American.  If you are 60-70 years of age, have your blood pressure checked every 3-5 years. If you are 54 years of age or older, have your blood pressure checked every year. You should have your blood pressure measured twice-once when you are at a hospital or clinic, and once when you are  not at a hospital or clinic. Record the average of the two measurements. To check your blood pressure when you are not at a hospital or clinic, you can use: ? An automated blood pressure machine at a pharmacy. ? A home blood pressure monitor.  If you are between 65 years and 5 years old, ask your health care provider if you should take aspirin to prevent strokes.  Have regular diabetes screenings. This involves taking a blood sample to check your fasting blood sugar level. ? If you are at a normal weight and have a low risk for diabetes, have this test once every three years after 58 years of age. ? If you are overweight and have a high risk for diabetes, consider being tested at a younger age or more often. Preventing infection Hepatitis B  If you have a higher risk for hepatitis B, you should be screened for this virus. You are considered at high risk for hepatitis B if: ? You were born in a country where hepatitis B is common. Ask your health care provider which countries are considered high risk. ? Your parents were born in a high-risk country, and you have not been immunized against hepatitis B (hepatitis B vaccine). ? You have HIV or AIDS. ? You use needles to inject street drugs. ? You live with someone who has hepatitis B. ? You have had sex with someone who has hepatitis B. ? You get hemodialysis treatment. ? You take certain medicines for conditions, including cancer, organ transplantation, and autoimmune conditions.  Hepatitis C  Blood testing is recommended for: ? Everyone born from [birth year] through 1966. ? Anyone with known risk factors for hepatitis C.  Sexually transmitted infections (STIs)  You should be screened for sexually transmitted infections (STIs) including gonorrhea and chlamydia if: ? You are sexually active and are younger than 58 years of age. ? You are older than 58 years of age and your health care provider tells you that you are at risk for this type of  infection. ? Your sexual activity has changed since you were last screened and you are at an increased risk for chlamydia or gonorrhea. Ask your health care provider if you are at risk.  If you do not have HIV, but are at risk, it may be recommended that you take a prescription medicine daily to prevent HIV infection. This is called pre-exposure prophylaxis (PrEP). You are considered at risk if: ? You are sexually active and do not regularly use condoms or know the HIV status of your partner(s). ?  You take drugs by injection. ? You are sexually active with a partner who has HIV.  Talk with your health care provider about whether you are at high risk of being infected with HIV. If you choose to begin PrEP, you should first be tested for HIV. You should then be tested every 3 months for as long as you are taking PrEP. Pregnancy  If you are premenopausal and you may become pregnant, ask your health care provider about preconception counseling.  If you may become pregnant, take 400 to 800 micrograms (mcg) of folic acid every day.  If you want to prevent pregnancy, talk to your health care provider about birth control (contraception). Osteoporosis and menopause  Osteoporosis is a disease in which the bones lose minerals and strength with aging. This can result in serious bone fractures. Your risk for osteoporosis can be identified using a bone density scan.  If you are 12 years of age or older, or if you are at risk for osteoporosis and fractures, ask your health care provider if you should be screened.  Ask your health care provider whether you should take a calcium or vitamin D supplement to lower your risk for osteoporosis.  Menopause may have certain physical symptoms and risks.  Hormone replacement therapy may reduce some of these symptoms and risks. Talk to your health care provider about whether hormone replacement therapy is right for you. Follow these instructions at home:  Schedule  regular health, dental, and eye exams.  Stay current with your immunizations.  Do not use any tobacco products including cigarettes, chewing tobacco, or electronic cigarettes.  If you are pregnant, do not drink alcohol.  If you are breastfeeding, limit how much and how often you drink alcohol.  Limit alcohol intake to no more than 1 drink per day for nonpregnant women. One drink equals 12 ounces of beer, 5 ounces of wine, or 1 ounces of hard liquor.  Do not use street drugs.  Do not share needles.  Ask your health care provider for help if you need support or information about quitting drugs.  Tell your health care provider if you often feel depressed.  Tell your health care provider if you have ever been abused or do not feel safe at home. This information is not intended to replace advice given to you by your health care provider. Make sure you discuss any questions you have with your health care provider. Document Released: 02/07/2011 Document Revised: 12/31/2015 Document Reviewed: 04/28/2015 Elsevier Interactive Patient Education  2018 [employer]. Parker Wherley M.D.

## 2017-09-27 ENCOUNTER — Encounter: Payer: Self-pay | Admitting: Internal Medicine

## 2017-09-27 ENCOUNTER — Ambulatory Visit (INDEPENDENT_AMBULATORY_CARE_PROVIDER_SITE_OTHER): Payer: Managed Care, Other (non HMO) | Admitting: Internal Medicine

## 2017-09-27 VITALS — BP 110/70 | HR 74 | Temp 97.6°F | Wt 179.7 lb

## 2017-09-27 DIAGNOSIS — I10 Essential (primary) hypertension: Secondary | ICD-10-CM | POA: Diagnosis not present

## 2017-09-27 DIAGNOSIS — Z79899 Other long term (current) drug therapy: Secondary | ICD-10-CM

## 2017-09-27 DIAGNOSIS — E785 Hyperlipidemia, unspecified: Secondary | ICD-10-CM

## 2017-09-27 DIAGNOSIS — E039 Hypothyroidism, unspecified: Secondary | ICD-10-CM | POA: Diagnosis not present

## 2017-09-27 DIAGNOSIS — E782 Mixed hyperlipidemia: Secondary | ICD-10-CM | POA: Diagnosis not present

## 2017-09-27 DIAGNOSIS — R053 Chronic cough: Secondary | ICD-10-CM

## 2017-09-27 DIAGNOSIS — G4700 Insomnia, unspecified: Secondary | ICD-10-CM | POA: Diagnosis not present

## 2017-09-27 DIAGNOSIS — R05 Cough: Secondary | ICD-10-CM | POA: Diagnosis not present

## 2017-09-27 LAB — LIPID PANEL
CHOLESTEROL: 230 mg/dL — AB (ref 0–200)
HDL: 53.4 mg/dL (ref >39.00)
Total CHOL/HDL Ratio: 4

## 2017-09-27 LAB — TSH: TSH: 2.54 u[IU]/mL (ref 0.35–4.50)

## 2017-09-27 LAB — LDL CHOLESTEROL, DIRECT: Direct LDL: 129 mg/dL

## 2017-09-27 MED ORDER — LOSARTAN POTASSIUM 50 MG PO TABS: 50.0000 mg | ORAL_TABLET | Freq: Every day | ORAL | 1 refills | Status: DC

## 2017-09-27 MED FILL — LOSARTAN POTASSIUM 50 MG TA: 50 | 90 days supply | Qty: 90 | Fill #0

## 2017-09-27 NOTE — Patient Instructions (Addendum)
Lets change your BP medication lisinopril  To  Losartan  And see what the cough does.   If better make sure BP is good and will refill  For a year.   If cough  not going away contact us   Lung exam is clear but may get chest x ray   To be sure.   Not sure when you are due for a pap but will rereview the record .   Get your colonscopy and check into cologuard payment otherwise.   Consideration of getting    prevnar at any time  .   And then pneumovax in a year    Health Maintenance, Female Adopting a healthy lifestyle and getting preventive care can go a long way to promote health and wellness. Talk with your health care provider about what schedule of regular examinations is right for you. This is a good chance for you to check in with your provider about disease prevention and staying healthy. In between checkups, there are plenty of things you can do on your own. Experts have done a lot of research about which lifestyle changes and preventive measures are most likely to keep you healthy. Ask your health care provider for more information. Weight and diet Eat a healthy diet  Be sure to include plenty of vegetables, fruits, low-fat dairy products, and lean protein.  Do not eat a lot of foods high in solid fats, added sugars, or salt.  Get regular exercise. This is one of the most important things you can do for your health. ? Most adults should exercise for at least 150 minutes each week. The exercise should increase your heart rate and make you sweat (moderate-intensity exercise). ? Most adults should also do strengthening exercises at least twice a week. This is in addition to the moderate-intensity exercise.  Maintain a healthy weight  Body mass index (BMI) is a measurement that can be used to identify possible weight problems. It estimates body fat based on height and weight. Your health care provider can help determine your BMI and help you achieve or maintain a healthy  weight.  For females 69 years of age and older: ? A BMI below 18.5 is considered underweight. ? A BMI of 18.5 to 24.9 is normal. ? A BMI of 25 to 29.9 is considered overweight. ? A BMI of 30 and above is considered obese.  Watch levels of cholesterol and blood lipids  You should start having your blood tested for lipids and cholesterol at 58 years of age, then have this test every 5 years.  You may need to have your cholesterol levels checked more often if: ? Your lipid or cholesterol levels are high. ? You are older than 58 years of age. ? You are at high risk for heart disease.  Cancer screening Lung Cancer  Lung cancer screening is recommended for adults 65-65 years old who are at high risk for lung cancer because of a history of smoking.  A yearly low-dose CT scan of the lungs is recommended for people who: ? Currently smoke. ? Have quit within the past 58 years. ? Have at least a 30-pack-year history of smoking. A pack year is smoking an average of one pack of cigarettes a day for 1 year.  Yearly screening should continue until it has been 15 years since you quit.  Yearly screening should stop if you develop a health problem that would prevent you from having lung cancer treatment.  Breast Cancer  Practice breast self-awareness. This means understanding how your breasts normally appear and feel.  It also means doing regular breast self-exams. Let your health care provider know about any changes, no matter how small.  If you are in your 58s or 30s, you should have a clinical breast exam (CBE) by a health care provider every 1-3 years as part of a regular health exam.  If you are 58 or older, have a CBE every year. Also consider having a breast X-ray (mammogram) every year.  If you have a family history of breast cancer, talk to your health care provider about genetic screening.  If you are at high risk for breast cancer, talk to your health care provider about having an  MRI and a mammogram every year.  Breast cancer gene (BRCA) assessment is recommended for women who have family members with BRCA-related cancers. BRCA-related cancers include: ? Breast. ? Ovarian. ? Tubal. ? Peritoneal cancers.  Results of the assessment will determine the need for genetic counseling and BRCA1 and BRCA2 testing.  Cervical Cancer Your health care provider may recommend that you be screened regularly for cancer of the pelvic organs (ovaries, uterus, and vagina). This screening involves a pelvic examination, including checking for microscopic changes to the surface of your cervix (Pap test). You may be encouraged to have this screening done every 3 years, beginning at age 58.  For women ages 45-65, health care providers may recommend pelvic exams and Pap testing every 3 years, or they may recommend the Pap and pelvic exam, combined with testing for human papilloma virus (HPV), every 5 years. Some types of HPV increase your risk of cervical cancer. Testing for HPV may also be done on women of any age with unclear Pap test results.  Other health care providers may not recommend any screening for nonpregnant women who are considered low risk for pelvic cancer and who do not have symptoms. Ask your health care provider if a screening pelvic exam is right for you.  If you have had past treatment for cervical cancer or a condition that could lead to cancer, you need Pap tests and screening for cancer for at least 20 years after your treatment. If Pap tests have been discontinued, your risk factors (such as having a new sexual partner) need to be reassessed to determine if screening should resume. Some women have medical problems that increase the chance of getting cervical cancer. In these cases, your health care provider may recommend more frequent screening and Pap tests.  Colorectal Cancer  This type of cancer can be detected and often prevented.  Routine colorectal cancer screening  usually begins at 58 years of age and continues through 58 years of age.  Your health care provider may recommend screening at an earlier age if you have risk factors for colon cancer.  Your health care provider may also recommend using home test kits to check for hidden blood in the stool.  A small camera at the end of a tube can be used to examine your colon directly (sigmoidoscopy or colonoscopy). This is done to check for the earliest forms of colorectal cancer.  Routine screening usually begins at age 8.  Direct examination of the colon should be repeated every 5-10 years through 57 years of age. However, you may need to be screened more often if early forms of precancerous polyps or small growths are found.  Skin Cancer  Check your skin from head to toe regularly.  Tell your health care  provider about any new moles or changes in moles, especially if there is a change in a mole's shape or color.  Also tell your health care provider if you have a mole that is larger than the size of a pencil eraser.  Always use sunscreen. Apply sunscreen liberally and repeatedly throughout the day.  Protect yourself by wearing long sleeves, pants, a wide-brimmed hat, and sunglasses whenever you are outside.  Heart disease, diabetes, and high blood pressure  High blood pressure causes heart disease and increases the risk of stroke. High blood pressure is more likely to develop in: ? People who have blood pressure in the high end of the normal range (130-139/85-89 mm Hg). ? People who are overweight or obese. ? People who are African American.  If you are 51-76 years of age, have your blood pressure checked every 3-5 years. If you are 71 years of age or older, have your blood pressure checked every year. You should have your blood pressure measured twice-once when you are at a hospital or clinic, and once when you are not at a hospital or clinic. Record the average of the two measurements. To check  your blood pressure when you are not at a hospital or clinic, you can use: ? An automated blood pressure machine at a pharmacy. ? A home blood pressure monitor.  If you are between 58 years and 98 years old, ask your health care provider if you should take aspirin to prevent strokes.  Have regular diabetes screenings. This involves taking a blood sample to check your fasting blood sugar level. ? If you are at a normal weight and have a low risk for diabetes, have this test once every three years after 58 years of age. ? If you are overweight and have a high risk for diabetes, consider being tested at a younger age or more often. Preventing infection Hepatitis B  If you have a higher risk for hepatitis B, you should be screened for this virus. You are considered at high risk for hepatitis B if: ? You were born in a country where hepatitis B is common. Ask your health care provider which countries are considered high risk. ? Your parents were born in a high-risk country, and you have not been immunized against hepatitis B (hepatitis B vaccine). ? You have HIV or AIDS. ? You use needles to inject street drugs. ? You live with someone who has hepatitis B. ? You have had sex with someone who has hepatitis B. ? You get hemodialysis treatment. ? You take certain medicines for conditions, including cancer, organ transplantation, and autoimmune conditions.  Hepatitis C  Blood testing is recommended for: ? Everyone born from [birth year] through 1966. ? Anyone with known risk factors for hepatitis C.  Sexually transmitted infections (STIs)  You should be screened for sexually transmitted infections (STIs) including gonorrhea and chlamydia if: ? You are sexually active and are younger than 58 years of age. ? You are older than 58 years of age and your health care provider tells you that you are at risk for this type of infection. ? Your sexual activity has changed since you were last screened and you  are at an increased risk for chlamydia or gonorrhea. Ask your health care provider if you are at risk.  If you do not have HIV, but are at risk, it may be recommended that you take a prescription medicine daily to prevent HIV infection. This is called pre-exposure prophylaxis (PrEP). You  are considered at risk if: ? You are sexually active and do not regularly use condoms or know the HIV status of your partner(s). ? You take drugs by injection. ? You are sexually active with a partner who has HIV.  Talk with your health care provider about whether you are at high risk of being infected with HIV. If you choose to begin PrEP, you should first be tested for HIV. You should then be tested every 3 months for as long as you are taking PrEP. Pregnancy  If you are premenopausal and you may become pregnant, ask your health care provider about preconception counseling.  If you may become pregnant, take 400 to 800 micrograms (mcg) of folic acid every day.  If you want to prevent pregnancy, talk to your health care provider about birth control (contraception). Osteoporosis and menopause  Osteoporosis is a disease in which the bones lose minerals and strength with aging. This can result in serious bone fractures. Your risk for osteoporosis can be identified using a bone density scan.  If you are 34 years of age or older, or if you are at risk for osteoporosis and fractures, ask your health care provider if you should be screened.  Ask your health care provider whether you should take a calcium or vitamin D supplement to lower your risk for osteoporosis.  Menopause may have certain physical symptoms and risks.  Hormone replacement therapy may reduce some of these symptoms and risks. Talk to your health care provider about whether hormone replacement therapy is right for you. Follow these instructions at home:  Schedule regular health, dental, and eye exams.  Stay current with your  immunizations.  Do not use any tobacco products including cigarettes, chewing tobacco, or electronic cigarettes.  If you are pregnant, do not drink alcohol.  If you are breastfeeding, limit how much and how often you drink alcohol.  Limit alcohol intake to no more than 1 drink per day for nonpregnant women. One drink equals 12 ounces of beer, 5 ounces of wine, or 1 ounces of hard liquor.  Do not use street drugs.  Do not share needles.  Ask your health care provider for help if you need support or information about quitting drugs.  Tell your health care provider if you often feel depressed.  Tell your health care provider if you have ever been abused or do not feel safe at home. This information is not intended to replace advice given to you by your health care provider. Make sure you discuss any questions you have with your health care provider. Document Released: 02/07/2011 Document Revised: 12/31/2015 Document Reviewed: 04/28/2015 Elsevier Interactive Patient Education  [employer].

## 2017-10-11 ENCOUNTER — Other Ambulatory Visit: Payer: Managed Care, Other (non HMO)

## 2017-10-11 ENCOUNTER — Inpatient Hospital Stay: Payer: Managed Care, Other (non HMO) | Attending: Hematology and Oncology

## 2017-10-11 DIAGNOSIS — C9210 Chronic myeloid leukemia, BCR/ABL-positive, not having achieved remission: Secondary | ICD-10-CM | POA: Diagnosis not present

## 2017-10-11 LAB — COMPREHENSIVE METABOLIC PANEL
ALBUMIN: 4.2 g/dL (ref 3.5–5.0)
ALT: 24 U/L (ref 0–55)
ANION GAP: 10 (ref 3–11)
AST: 20 U/L (ref 5–34)
Alkaline Phosphatase: 59 U/L (ref 40–150)
BUN: 17 mg/dL (ref 7–26)
CHLORIDE: 104 mmol/L (ref 98–109)
CO2: 24 mmol/L (ref 22–29)
Calcium: 9.9 mg/dL (ref 8.4–10.4)
Creatinine, Ser: 1.01 mg/dL (ref 0.60–1.10)
GFR calc Af Amer: >60 mL/min (ref >60)
Glucose, Bld: 101 mg/dL (ref 70–140)
POTASSIUM: 4.4 mmol/L (ref 3.5–5.1)
Sodium: 138 mmol/L (ref 136–145)
Total Bilirubin: 0.3 mg/dL (ref 0.2–1.2)
Total Protein: 7.5 g/dL (ref 6.4–8.3)

## 2017-10-11 LAB — CBC WITH DIFFERENTIAL/PLATELET
BASOS ABS: 0.1 10*3/uL (ref 0.0–0.1)
BASOS PCT: 1 %
EOS ABS: 0.3 10*3/uL (ref 0.0–0.5)
Eosinophils Relative: 5 %
HCT: 41.6 % (ref 34.8–46.6)
Hemoglobin: 14 g/dL (ref 11.6–15.9)
Lymphocytes Relative: 30 %
Lymphs Abs: 2.1 10*3/uL (ref 0.9–3.3)
MCH: 31.4 pg (ref 25.1–34.0)
MCHC: 33.7 g/dL (ref 31.5–36.0)
MCV: 93.3 fL (ref 79.5–101.0)
Monocytes Absolute: 0.6 10*3/uL (ref 0.1–0.9)
Monocytes Relative: 9 %
Neutro Abs: 3.8 10*3/uL (ref 1.5–6.5)
Neutrophils Relative %: 55 %
PLATELETS: 291 10*3/uL (ref 145–400)
RBC: 4.46 MIL/uL (ref 3.70–5.45)
RDW: 13.7 % (ref 11.2–14.5)
WBC: 6.8 10*3/uL (ref 3.9–10.3)

## 2017-10-17 MED FILL — SPRYCEL 50 MG TABLET: 50 | 30 days supply | Qty: 30 | Fill #9

## 2017-10-18 ENCOUNTER — Inpatient Hospital Stay (HOSPITAL_BASED_OUTPATIENT_CLINIC_OR_DEPARTMENT_OTHER): Payer: Managed Care, Other (non HMO) | Admitting: Hematology and Oncology

## 2017-10-18 ENCOUNTER — Encounter: Payer: Self-pay | Admitting: Hematology and Oncology

## 2017-10-18 ENCOUNTER — Telehealth: Payer: Self-pay | Admitting: Hematology and Oncology

## 2017-10-18 ENCOUNTER — Other Ambulatory Visit: Payer: Self-pay | Admitting: *Deleted

## 2017-10-18 DIAGNOSIS — E781 Pure hyperglyceridemia: Secondary | ICD-10-CM

## 2017-10-18 DIAGNOSIS — C9210 Chronic myeloid leukemia, BCR/ABL-positive, not having achieved remission: Secondary | ICD-10-CM | POA: Diagnosis not present

## 2017-10-18 NOTE — Progress Notes (Signed)
[facility] OFFICE PROGRESS NOTE  Patient Care Team: Panosh, Standley Brooking, MD as PCP - General [name], MD (Endocrinology) [name], MD (Internal Medicine) Everett Graff, MD as Attending Physician (Obstetrics and Gynecology)  ASSESSMENT & PLAN:  CML (chronic myeloid leukemia) [facility]) The patient has achieved major molecular response. She is comfortable to continue on reduced dose Sprycel 50 mg daily If the patient is interested to stop Sprycel I told her, she needs to document at least 2 years of MMR before consideration to stop treatment, and it would be around January 2020 I will see her back in 4 months to repeat blood work as well.   No orders of the defined types were placed in this encounter.   INTERVAL HISTORY: Please see below for problem oriented charting. She returns for further follow-up She is doing well with treatment No recent infection Denies recent shortness of breath, peripheral edema or diarrhea.  She is compliant taking all medications as prescribed  SUMMARY OF ONCOLOGIC HISTORY:   CML (chronic myeloid leukemia) ([place])   05/15/2015 Bone Marrow Biopsy    BM biopsy and FISH confirmed CML [id number]      05/15/2015 - 05/20/2015 Chemotherapy    She started taking Hydrea 1500 mg daily      05/15/2015 Tumor Marker    BCR/ABL b2a2 87.5%, IS 49%      05/21/2015 -  Chemotherapy    She started taking Sprycel 100 mg daily      06/29/2015 Adverse Reaction    She had diarrhea. Dose of Srycel is reduced to 50 mg daily      09/10/2015 Tumor Marker    BCR/ABL b2a2 0.21%, IS 0.1743%      12/11/2015 Pathology Results    BCR/ABL b2a2 0.19%, IS 0.1577%      04/13/2016 Pathology Results    BCR/ABL b2a2 0.29%, IS 0.2407%      07/14/2016 Pathology Results    BCR/ABL b2a2 0.004%, IS 0.0092%. She is in MMR      07/21/2016 Miscellaneous    Dose of Sprycel is reduced to 50 mg daily      09/08/2016 Pathology Results    BCR/ABL b2a2 0.001%, IS  0.0023%. She is in MMR      06/14/2017 Pathology Results    BCR/ABL not detected. She is in MMR      10/11/2017 Pathology Results    BCR/ABL is not detected. She is in MMR       REVIEW OF SYSTEMS:   Constitutional: Denies fevers, chills or abnormal weight loss Eyes: Denies blurriness of vision Ears, nose, mouth, throat, and face: Denies mucositis or sore throat Respiratory: Denies cough, dyspnea or wheezes Cardiovascular: Denies palpitation, chest discomfort or lower extremity swelling Gastrointestinal:  Denies nausea, heartburn or change in bowel habits Skin: Denies abnormal skin rashes Lymphatics: Denies new lymphadenopathy or easy bruising Neurological:Denies numbness, tingling or new weaknesses Behavioral/Psych: Mood is stable, no new changes  All other systems were reviewed with the patient and are negative.  I have reviewed the past medical history, past surgical history, social history and family history with the patient and they are unchanged from previous note.  ALLERGIES:  has No Known Allergies.  MEDICATIONS:  Current Outpatient Medications  Medication Sig Dispense Refill  . dasatinib (SPRYCEL) 50 MG tablet Take 1 tablet (50 mg total) daily by mouth. 90 tablet 3  . losartan (COZAAR) 50 MG tablet Take 1 tablet (50 mg total) by mouth daily. 90 tablet 1  .  SYNTHROID 150 MCG tablet Take 1 tablet (150 mcg total) by mouth daily before breakfast. 90 tablet 3  . verapamil (CALAN-SR) 240 MG CR tablet Take 1 tablet (240 mg total) by mouth at bedtime. 90 tablet 0  . zolpidem (AMBIEN) 10 MG tablet Take 1 tablet (10 mg total) by mouth at bedtime as needed. for sleep 90 tablet 0   No current facility-administered medications for this visit.     PHYSICAL EXAMINATION: ECOG PERFORMANCE STATUS: 0 - Asymptomatic  Vitals:   10/18/17 0857  BP: 123/72  Pulse: 65  Resp: 18  Temp: 97.6 F (36.4 C)  SpO2: 100%   Filed Weights   10/18/17 0857  Weight: 179 lb 1.6 oz (81.2 kg)     GENERAL:alert, no distress and comfortable SKIN: skin color, texture, turgor are normal, no rashes or significant lesions EYES: normal, Conjunctiva are pink and non-injected, sclera clear OROPHARYNX:no exudate, no erythema and lips, buccal mucosa, and tongue normal  NECK: supple, thyroid normal size, non-tender, without nodularity LYMPH:  no palpable lymphadenopathy in the cervical, axillary or inguinal LUNGS: clear to auscultation and percussion with normal breathing effort HEART: regular rate & rhythm and no murmurs and no lower extremity edema ABDOMEN:abdomen soft, non-tender and normal bowel sounds Musculoskeletal:no cyanosis of digits and no clubbing  NEURO: alert & oriented x 3 with fluent speech, no focal motor/sensory deficits  LABORATORY DATA:  I have reviewed the data as listed    Component Value Date/Time   NA 138 10/11/2017 0806   NA 138 02/07/2017 0836   K 4.4 10/11/2017 0806   K 4.2 02/07/2017 0836   CL 104 10/11/2017 0806   CO2 24 10/11/2017 0806   CO2 23 02/07/2017 0836   GLUCOSE 101 10/11/2017 0806   GLUCOSE 85 02/07/2017 0836   BUN 17 10/11/2017 0806   BUN 15.5 02/07/2017 0836   CREATININE 1.01 10/11/2017 0806   CREATININE 0.9 02/07/2017 0836   CALCIUM 9.9 10/11/2017 0806   CALCIUM 9.5 02/07/2017 0836   PROT 7.5 10/11/2017 0806   PROT 7.0 02/07/2017 0836   ALBUMIN 4.2 10/11/2017 0806   ALBUMIN 3.8 02/07/2017 0836   AST 20 10/11/2017 0806   AST 25 02/07/2017 0836   ALT 24 10/11/2017 0806   ALT 36 02/07/2017 0836   ALKPHOS 59 10/11/2017 0806   ALKPHOS 55 02/07/2017 0836   BILITOT 0.3 10/11/2017 0806   BILITOT 0.31 02/07/2017 0836   GFRNONAA >60 10/11/2017 0806   GFRAA >60 10/11/2017 0806    No results found for: SPEP, UPEP  Lab Results  Component Value Date   WBC 6.8 10/11/2017   NEUTROABS 3.8 10/11/2017   HGB 14.0 10/11/2017   HCT 41.6 10/11/2017   MCV 93.3 10/11/2017   PLT 291 10/11/2017      Chemistry      Component Value Date/Time    NA 138 10/11/2017 0806   NA 138 02/07/2017 0836   K 4.4 10/11/2017 0806   K 4.2 02/07/2017 0836   CL 104 10/11/2017 0806   CO2 24 10/11/2017 0806   CO2 23 02/07/2017 0836   BUN 17 10/11/2017 0806   BUN 15.5 02/07/2017 0836   CREATININE 1.01 10/11/2017 0806   CREATININE 0.9 02/07/2017 0836      Component Value Date/Time   CALCIUM 9.9 10/11/2017 0806   CALCIUM 9.5 02/07/2017 0836   ALKPHOS 59 10/11/2017 0806   ALKPHOS 55 02/07/2017 0836   AST 20 10/11/2017 0806   AST 25 02/07/2017 0836  ALT 24 10/11/2017 0806   ALT 36 02/07/2017 0836   BILITOT 0.3 10/11/2017 0806   BILITOT 0.31 02/07/2017 0836      All questions were answered. The patient knows to call the clinic with any problems, questions or concerns. No barriers to learning was detected.  I spent 10 minutes counseling the patient face to face. The total time spent in the appointment was 15 minutes and more than 50% was on counseling and review of test results  Heath Lark, MD 10/18/2017 10:07 AM

## 2017-10-18 NOTE — Assessment & Plan Note (Signed)
The patient has achieved major molecular response. She is comfortable to continue on reduced dose Sprycel 50 mg daily If the patient is interested to stop Sprycel I told her, she needs to document at least 2 years of MMR before consideration to stop treatment, and it would be around January 2020 I will see her back in 4 months to repeat blood work as well.

## 2017-10-18 NOTE — Telephone Encounter (Signed)
Gave patient AVs and calendar of upcoming July appointments.  °

## 2017-10-19 ENCOUNTER — Encounter: Payer: Self-pay | Admitting: *Deleted

## 2017-10-23 MED FILL — VERAPAMIL ER 240 MG TABLET: 240 | 90 days supply | Qty: 90 | Fill #1

## 2017-10-25 LAB — BCR/ABL

## 2017-10-27 ENCOUNTER — Other Ambulatory Visit (INDEPENDENT_AMBULATORY_CARE_PROVIDER_SITE_OTHER): Payer: Managed Care, Other (non HMO)

## 2017-10-27 DIAGNOSIS — E781 Pure hyperglyceridemia: Secondary | ICD-10-CM | POA: Diagnosis not present

## 2017-10-27 LAB — LIPID PANEL
CHOL/HDL RATIO: 4
Cholesterol: 221 mg/dL — ABNORMAL HIGH (ref 0–200)
HDL: 62.9 mg/dL (ref >39.00)
LDL CALC: 129 mg/dL — AB (ref 0–99)
NONHDL: 158.03
TRIGLYCERIDES: 143 mg/dL (ref 0.0–149.0)
VLDL: 28.6 mg/dL (ref 0.0–40.0)

## 2017-10-27 LAB — BASIC METABOLIC PANEL
BUN: 18 mg/dL (ref 6–23)
CALCIUM: 9.1 mg/dL (ref 8.4–10.5)
CHLORIDE: 105 meq/L (ref 96–112)
CO2: 26 meq/L (ref 19–32)
CREATININE: 0.94 mg/dL (ref 0.40–1.20)
GFR: 65.16 mL/min (ref >60.00)
Glucose, Bld: 95 mg/dL (ref 70–99)
Potassium: 4.5 mEq/L (ref 3.5–5.1)
Sodium: 139 mEq/L (ref 135–145)

## 2017-10-27 LAB — HEMOGLOBIN A1C: HEMOGLOBIN A1C: 5.5 % (ref 4.6–6.5)

## 2017-11-03 ENCOUNTER — Encounter: Payer: Self-pay | Admitting: *Deleted

## 2017-11-09 ENCOUNTER — Telehealth: Payer: Self-pay | Admitting: Family Medicine

## 2017-11-09 ENCOUNTER — Other Ambulatory Visit: Payer: Self-pay | Admitting: Internal Medicine

## 2017-11-09 DIAGNOSIS — Z8639 Personal history of other endocrine, nutritional and metabolic disease: Secondary | ICD-10-CM

## 2017-11-09 DIAGNOSIS — E039 Hypothyroidism, unspecified: Secondary | ICD-10-CM

## 2017-11-09 NOTE — Telephone Encounter (Signed)
Please refill name brand Synthroid at [facility]

## 2017-11-10 MED ORDER — SYNTHROID 150 MCG PO TABS: 150.0000 ug | ORAL_TABLET | Freq: Every day | ORAL | 3 refills | Status: DC

## 2017-11-10 MED FILL — SYNTHROID 150 MCG TABLET: 150 | 90 days supply | Qty: 90 | Fill #0

## 2017-11-10 NOTE — Telephone Encounter (Signed)
Rx sent  Pt husband aware Nothing further needed.

## 2017-11-13 MED ORDER — ZOLPIDEM TARTRATE 10 MG PO TABS: 10.0000 mg | ORAL_TABLET | Freq: Every evening | ORAL | 0 refills | Status: DC | PRN

## 2017-11-13 MED FILL — ZOLPIDEM TARTRATE 10 MG TAB: 10 | 90 days supply | Qty: 90 | Fill #0

## 2017-11-13 NOTE — Addendum Note (Signed)
Addended by: Virl Cagey on: 11/13/2017 08:33 AM   Modules accepted: Orders

## 2017-11-13 NOTE — Telephone Encounter (Addendum)
Last filled 07/25/17, #90 Please advise Dr Regis Bill, thanks.    [place]

## 2017-11-13 NOTE — Telephone Encounter (Signed)
Please call in Zolpidem #[phone] with one rf to [place]

## 2017-11-13 NOTE — Telephone Encounter (Signed)
Sent in refill electronically 

## 2017-11-13 NOTE — Addendum Note (Signed)
Addended byShanon Ace K on: 11/13/2017 12:25 PM   Modules accepted: Orders

## 2017-11-21 ENCOUNTER — Telehealth: Payer: Self-pay | Admitting: Pharmacy Technician

## 2017-11-21 NOTE — Telephone Encounter (Signed)
Oral Oncology Patient Advocate Encounter  Received notification from the [place] that the existing prior authorization for Sprycel is due for renewal.  Renewal PA submitted on CoverMyMeds [place] Status is pending  Oral Oncology Clinic will continue to follow.  Fabio Asa. Melynda Keller, [place] Patient [place] [phone] 11/21/2017 11:06 AM

## 2017-11-22 MED FILL — SPRYCEL 50 MG TABLET: 50 | 30 days supply | Qty: 30 | Fill #0

## 2017-11-22 NOTE — Telephone Encounter (Signed)
Oral Oncology Patient Advocate Encounter  Prior Authorization for Sprycel has been approved.    PA# [id number] Effective dates: 11/21/2017 through 11/21/2018  Oral Oncology Clinic will continue to follow.   Christina [name]. Melynda Keller, [place] Patient [place] [phone] 11/22/2017 8:09 AM

## 2017-12-25 MED FILL — SPRYCEL 50 MG TABLET: 50 | 30 days supply | Qty: 30 | Fill #1

## 2018-01-08 MED FILL — LOSARTAN POTASSIUM 50 MG TA: 50 | 90 days supply | Qty: 90 | Fill #1

## 2018-01-22 MED FILL — VERAPAMIL ER 240 MG TABLET: 240 | 90 days supply | Qty: 90 | Fill #0

## 2018-01-29 MED FILL — SPRYCEL 50 MG TABLET: 50 | 30 days supply | Qty: 30 | Fill #2

## 2018-02-13 MED FILL — SYNTHROID 150 MCG TABLET: 150 | 90 days supply | Qty: 90 | Fill #1

## 2018-02-13 MED FILL — ZOLPIDEM TARTRATE 10 MG TAB: 10 | 90 days supply | Qty: 90 | Fill #0

## 2018-02-14 ENCOUNTER — Inpatient Hospital Stay: Payer: Managed Care, Other (non HMO) | Attending: Hematology and Oncology

## 2018-02-14 DIAGNOSIS — Z79899 Other long term (current) drug therapy: Secondary | ICD-10-CM | POA: Insufficient documentation

## 2018-02-14 DIAGNOSIS — C9210 Chronic myeloid leukemia, BCR/ABL-positive, not having achieved remission: Secondary | ICD-10-CM | POA: Diagnosis not present

## 2018-02-14 LAB — CBC WITH DIFFERENTIAL/PLATELET
BASOS PCT: 0 %
Basophils Absolute: 0 10*3/uL (ref 0.0–0.1)
EOS ABS: 0.3 10*3/uL (ref 0.0–0.5)
Eosinophils Relative: 5 %
HEMATOCRIT: 41.2 % (ref 34.8–46.6)
HEMOGLOBIN: 14.1 g/dL (ref 11.6–15.9)
Lymphocytes Relative: 34 %
Lymphs Abs: 2 10*3/uL (ref 0.9–3.3)
MCH: 32 pg (ref 25.1–34.0)
MCHC: 34.2 g/dL (ref 31.5–36.0)
MCV: 93.8 fL (ref 79.5–101.0)
MONOS PCT: 8 %
Monocytes Absolute: 0.5 10*3/uL (ref 0.1–0.9)
NEUTROS ABS: 3.1 10*3/uL (ref 1.5–6.5)
NEUTROS PCT: 53 %
Platelets: 257 10*3/uL (ref 145–400)
RBC: 4.39 MIL/uL (ref 3.70–5.45)
RDW: 13.1 % (ref 11.2–14.5)
WBC: 5.8 10*3/uL (ref 3.9–10.3)

## 2018-02-14 LAB — COMPREHENSIVE METABOLIC PANEL
ALT: 28 U/L (ref 0–44)
ANION GAP: 8 (ref 5–15)
AST: 23 U/L (ref 15–41)
Albumin: 4.2 g/dL (ref 3.5–5.0)
Alkaline Phosphatase: 60 U/L (ref 38–126)
BUN: 17 mg/dL (ref 6–20)
CHLORIDE: 104 mmol/L (ref 98–111)
CO2: 25 mmol/L (ref 22–32)
Calcium: 9.5 mg/dL (ref 8.9–10.3)
Creatinine, Ser: 0.95 mg/dL (ref 0.44–1.00)
Glucose, Bld: 103 mg/dL — ABNORMAL HIGH (ref 70–99)
Potassium: 4.2 mmol/L (ref 3.5–5.1)
SODIUM: 137 mmol/L (ref 135–145)
Total Bilirubin: 0.3 mg/dL (ref 0.3–1.2)
Total Protein: 7 g/dL (ref 6.5–8.1)

## 2018-02-21 ENCOUNTER — Telehealth: Payer: Self-pay | Admitting: Hematology and Oncology

## 2018-02-21 ENCOUNTER — Other Ambulatory Visit: Payer: Self-pay | Admitting: Hematology and Oncology

## 2018-02-21 ENCOUNTER — Inpatient Hospital Stay (HOSPITAL_BASED_OUTPATIENT_CLINIC_OR_DEPARTMENT_OTHER): Payer: Managed Care, Other (non HMO) | Admitting: Hematology and Oncology

## 2018-02-21 ENCOUNTER — Encounter: Payer: Self-pay | Admitting: Hematology and Oncology

## 2018-02-21 DIAGNOSIS — Z79899 Other long term (current) drug therapy: Secondary | ICD-10-CM | POA: Diagnosis not present

## 2018-02-21 DIAGNOSIS — C9210 Chronic myeloid leukemia, BCR/ABL-positive, not having achieved remission: Secondary | ICD-10-CM

## 2018-02-21 NOTE — Progress Notes (Signed)
[place] OFFICE PROGRESS NOTE  Patient Care Team: Panosh, Standley Brooking, MD as PCP - General [name], MD (Endocrinology) [name], MD (Internal Medicine) Everett Graff, MD as Attending Physician (Obstetrics and Gynecology)  ASSESSMENT & PLAN:  CML (chronic myeloid leukemia) [facility]) The patient has achieved major molecular response. She is comfortable to continue on reduced dose Sprycel 50 mg daily She tolerated treatment very well with no major side effects I have reviewed the guidelines in regard to discontinuation of tyrosine kinase inhibitor Based on the latest update, she has to be in MMR for 2 years before consideration to stop treatment, and it would be around January 2020 Recently, she has discussed this with her husband and she decided against discontinuation of Sprycel We will continue treatment indefinitely   No orders of the defined types were placed in this encounter.   INTERVAL HISTORY: Please see below for problem oriented charting. She returns for CML follow-up She feels well She tolerated treatment very well with no major side effects No recent infection, cough, shortness of breath or fluid retention  She has no difficulties getting her medications refilled  SUMMARY OF ONCOLOGIC HISTORY:   CML (chronic myeloid leukemia) ([place])   05/15/2015 Bone Marrow Biopsy    BM biopsy and FISH confirmed CML [id number]      05/15/2015 - 05/20/2015 Chemotherapy    She started taking Hydrea 1500 mg daily      05/15/2015 Tumor Marker    BCR/ABL b2a2 87.5%, IS 49%      05/21/2015 -  Chemotherapy    She started taking Sprycel 100 mg daily      06/29/2015 Adverse Reaction    She had diarrhea. Dose of Srycel is reduced to 50 mg daily      09/10/2015 Tumor Marker    BCR/ABL b2a2 0.21%, IS 0.1743%      12/11/2015 Pathology Results    BCR/ABL b2a2 0.19%, IS 0.1577%      04/13/2016 Pathology Results    BCR/ABL b2a2 0.29%, IS 0.2407%      07/14/2016 Pathology Results    BCR/ABL b2a2 0.004%, IS 0.0092%. She is in MMR      07/21/2016 Miscellaneous    Dose of Sprycel is reduced to 50 mg daily      09/08/2016 Pathology Results    BCR/ABL b2a2 0.001%, IS 0.0023%. She is in MMR      06/14/2017 Pathology Results    BCR/ABL not detected. She is in MMR      10/11/2017 Pathology Results    BCR/ABL is not detected. She is in MMR      02/14/2018 Pathology Results    BCR/ABL is not detected. She is in MMR       REVIEW OF SYSTEMS:   Constitutional: Denies fevers, chills or abnormal weight loss Eyes: Denies blurriness of vision Ears, nose, mouth, throat, and face: Denies mucositis or sore throat Respiratory: Denies cough, dyspnea or wheezes Cardiovascular: Denies palpitation, chest discomfort or lower extremity swelling Gastrointestinal:  Denies nausea, heartburn or change in bowel habits Skin: Denies abnormal skin rashes Lymphatics: Denies new lymphadenopathy or easy bruising Neurological:Denies numbness, tingling or new weaknesses Behavioral/Psych: Mood is stable, no new changes  All other systems were reviewed with the patient and are negative.  I have reviewed the past medical history, past surgical history, social history and family history with the patient and they are unchanged from previous note.  ALLERGIES:  has No Known Allergies.  MEDICATIONS:  Current Outpatient Medications  Medication Sig Dispense Refill  . dasatinib (SPRYCEL) 50 MG tablet Take 1 tablet (50 mg total) daily by mouth. 90 tablet 3  . losartan (COZAAR) 50 MG tablet Take 1 tablet (50 mg total) by mouth daily. 90 tablet 1  . SYNTHROID 150 MCG tablet Take 1 tablet (150 mcg total) by mouth daily before breakfast. 90 tablet 3  . verapamil (CALAN-SR) 240 MG CR tablet Take 1 tablet (240 mg total) by mouth at bedtime. 90 tablet 0  . zolpidem (AMBIEN) 10 MG tablet Take 1 tablet (10 mg total) by mouth at bedtime as needed. for sleep 90 tablet 0   No  current facility-administered medications for this visit.     PHYSICAL EXAMINATION: ECOG PERFORMANCE STATUS: 0 - Asymptomatic  Vitals:   02/21/18 0849  BP: 130/67  Pulse: 75  Resp: 18  Temp: 97.8 F (36.6 C)  SpO2: 99%   Filed Weights   02/21/18 0849  Weight: 181 lb 4.8 oz (82.2 kg)    GENERAL:alert, no distress and comfortable SKIN: skin color, texture, turgor are normal, no rashes or significant lesions EYES: normal, Conjunctiva are pink and non-injected, sclera clear OROPHARYNX:no exudate, no erythema and lips, buccal mucosa, and tongue normal  NECK: supple, thyroid normal size, non-tender, without nodularity LYMPH:  no palpable lymphadenopathy in the cervical, axillary or inguinal LUNGS: clear to auscultation and percussion with normal breathing effort HEART: regular rate & rhythm and no murmurs and no lower extremity edema ABDOMEN:abdomen soft, non-tender and normal bowel sounds Musculoskeletal:no cyanosis of digits and no clubbing  NEURO: alert & oriented x 3 with fluent speech, no focal motor/sensory deficits  LABORATORY DATA:  I have reviewed the data as listed    Component Value Date/Time   NA 137 02/14/2018 0810   NA 138 02/07/2017 0836   K 4.2 02/14/2018 0810   K 4.2 02/07/2017 0836   CL 104 02/14/2018 0810   CO2 25 02/14/2018 0810   CO2 23 02/07/2017 0836   GLUCOSE 103 (H) 02/14/2018 0810   GLUCOSE 85 02/07/2017 0836   BUN 17 02/14/2018 0810   BUN 15.5 02/07/2017 0836   CREATININE 0.95 02/14/2018 0810   CREATININE 0.9 02/07/2017 0836   CALCIUM 9.5 02/14/2018 0810   CALCIUM 9.5 02/07/2017 0836   PROT 7.0 02/14/2018 0810   PROT 7.0 02/07/2017 0836   ALBUMIN 4.2 02/14/2018 0810   ALBUMIN 3.8 02/07/2017 0836   AST 23 02/14/2018 0810   AST 25 02/07/2017 0836   ALT 28 02/14/2018 0810   ALT 36 02/07/2017 0836   ALKPHOS 60 02/14/2018 0810   ALKPHOS 55 02/07/2017 0836   BILITOT 0.3 02/14/2018 0810   BILITOT 0.31 02/07/2017 0836   GFRNONAA >60  02/14/2018 0810   GFRAA >60 02/14/2018 0810    No results found for: SPEP, UPEP  Lab Results  Component Value Date   WBC 5.8 02/14/2018   NEUTROABS 3.1 02/14/2018   HGB 14.1 02/14/2018   HCT 41.2 02/14/2018   MCV 93.8 02/14/2018   PLT 257 02/14/2018      Chemistry      Component Value Date/Time   NA 137 02/14/2018 0810   NA 138 02/07/2017 0836   K 4.2 02/14/2018 0810   K 4.2 02/07/2017 0836   CL 104 02/14/2018 0810   CO2 25 02/14/2018 0810   CO2 23 02/07/2017 0836   BUN 17 02/14/2018 0810   BUN 15.5 02/07/2017 0836   CREATININE 0.95 02/14/2018 0810   CREATININE 0.9 02/07/2017 0836  Component Value Date/Time   CALCIUM 9.5 02/14/2018 0810   CALCIUM 9.5 02/07/2017 0836   ALKPHOS 60 02/14/2018 0810   ALKPHOS 55 02/07/2017 0836   AST 23 02/14/2018 0810   AST 25 02/07/2017 0836   ALT 28 02/14/2018 0810   ALT 36 02/07/2017 0836   BILITOT 0.3 02/14/2018 0810   BILITOT 0.31 02/07/2017 0836       All questions were answered. The patient knows to call the clinic with any problems, questions or concerns. No barriers to learning was detected.  I spent 10 minutes counseling the patient face to face. The total time spent in the appointment was 15 minutes and more than 50% was on counseling and review of test results  Heath Lark, MD 02/21/2018 9:33 AM

## 2018-02-21 NOTE — Assessment & Plan Note (Signed)
The patient has achieved major molecular response. She is comfortable to continue on reduced dose Sprycel 50 mg daily She tolerated treatment very well with no major side effects I have reviewed the guidelines in regard to discontinuation of tyrosine kinase inhibitor Based on the latest update, she has to be in MMR for 2 years before consideration to stop treatment, and it would be around January 2020 Recently, she has discussed this with her husband and she decided against discontinuation of Sprycel We will continue treatment indefinitely

## 2018-02-21 NOTE — Telephone Encounter (Signed)
Per 7/17 patient decline avs and calendar

## 2018-03-01 MED FILL — SPRYCEL 50 MG TABLET: 50 | 30 days supply | Qty: 30 | Fill #3

## 2018-03-07 ENCOUNTER — Encounter: Payer: Self-pay | Admitting: Gastroenterology

## 2018-03-09 LAB — BCR/ABL

## 2018-04-02 MED FILL — SPRYCEL 50 MG TABLET: 50 | 30 days supply | Qty: 30 | Fill #4

## 2018-04-10 ENCOUNTER — Telehealth: Payer: Self-pay | Admitting: Family Medicine

## 2018-04-10 MED ORDER — LOSARTAN POTASSIUM 50 MG PO TABS: 50.0000 mg | ORAL_TABLET | Freq: Every day | ORAL | 1 refills | Status: DC

## 2018-04-10 MED FILL — LOSARTAN POTASSIUM 50 MG TA: 50 | 90 days supply | Qty: 90 | Fill #0

## 2018-04-10 NOTE — Telephone Encounter (Signed)
Please call in 90 day refills of Losartan to [place]

## 2018-04-10 NOTE — Telephone Encounter (Signed)
Losartan refilled to [facility], #90 x 1rf Pt will be due for annual visit in Feb 2020.  Nothing further needed.

## 2018-04-11 ENCOUNTER — Encounter: Payer: Managed Care, Other (non HMO) | Admitting: Gastroenterology

## 2018-04-16 ENCOUNTER — Telehealth: Payer: Self-pay | Admitting: Family Medicine

## 2018-04-16 MED ORDER — VERAPAMIL HCL ER 240 MG PO TBCR: 240.0000 mg | EXTENDED_RELEASE_TABLET | Freq: Every day | ORAL | 1 refills | Status: DC

## 2018-04-16 MED FILL — VERAPAMIL ER 240 MG TABLET: 240 | 90 days supply | Qty: 90 | Fill #0

## 2018-04-16 NOTE — Telephone Encounter (Signed)
Please refill Verapamil for 90 days and refills to [place]

## 2018-04-16 NOTE — Telephone Encounter (Signed)
Verapamil refilled to [facility], #90 x 1rf Pt will be due for annual visit in Feb 2020.  Nothing further needed.

## 2018-05-01 MED FILL — SPRYCEL 50 MG TABLET: 50 | 30 days supply | Qty: 30 | Fill #5

## 2018-05-15 ENCOUNTER — Telehealth: Payer: Self-pay | Admitting: Family Medicine

## 2018-05-15 ENCOUNTER — Other Ambulatory Visit: Payer: Self-pay | Admitting: Adult Health

## 2018-05-15 MED ORDER — ZOLPIDEM TARTRATE 10 MG PO TABS: 10.0000 mg | ORAL_TABLET | Freq: Every evening | ORAL | 0 refills | Status: DC | PRN

## 2018-05-15 NOTE — Telephone Encounter (Signed)
Please call in #30 Zolpidem to [place] since Dr. Regis Bill is out of the office

## 2018-05-16 ENCOUNTER — Inpatient Hospital Stay: Payer: Managed Care, Other (non HMO) | Attending: Hematology and Oncology

## 2018-05-16 DIAGNOSIS — C9211 Chronic myeloid leukemia, BCR/ABL-positive, in remission: Secondary | ICD-10-CM | POA: Insufficient documentation

## 2018-05-16 DIAGNOSIS — C9210 Chronic myeloid leukemia, BCR/ABL-positive, not having achieved remission: Secondary | ICD-10-CM

## 2018-05-16 DIAGNOSIS — Z79899 Other long term (current) drug therapy: Secondary | ICD-10-CM | POA: Insufficient documentation

## 2018-05-16 DIAGNOSIS — Z23 Encounter for immunization: Secondary | ICD-10-CM | POA: Diagnosis not present

## 2018-05-16 LAB — COMPREHENSIVE METABOLIC PANEL
ALBUMIN: 3.9 g/dL (ref 3.5–5.0)
ALK PHOS: 57 U/L (ref 38–126)
ALT: 19 U/L (ref 0–44)
AST: 18 U/L (ref 15–41)
Anion gap: 9 (ref 5–15)
BUN: 13 mg/dL (ref 6–20)
CHLORIDE: 105 mmol/L (ref 98–111)
CO2: 24 mmol/L (ref 22–32)
Calcium: 9.5 mg/dL (ref 8.9–10.3)
Creatinine, Ser: 0.88 mg/dL (ref 0.44–1.00)
GFR calc Af Amer: >60 mL/min (ref >60)
GFR calc non Af Amer: >60 mL/min (ref >60)
GLUCOSE: 93 mg/dL (ref 70–99)
POTASSIUM: 4.2 mmol/L (ref 3.5–5.1)
SODIUM: 138 mmol/L (ref 135–145)
Total Bilirubin: 0.4 mg/dL (ref 0.3–1.2)
Total Protein: 7.1 g/dL (ref 6.5–8.1)

## 2018-05-16 LAB — CBC WITH DIFFERENTIAL/PLATELET
Abs Immature Granulocytes: 0.02 10*3/uL (ref 0.00–0.07)
BASOS ABS: 0.1 10*3/uL (ref 0.0–0.1)
BASOS PCT: 1 %
EOS PCT: 4 %
Eosinophils Absolute: 0.3 10*3/uL (ref 0.0–0.5)
HCT: 40.3 % (ref 36.0–46.0)
HEMOGLOBIN: 13.7 g/dL (ref 12.0–15.0)
Immature Granulocytes: 0 %
LYMPHS PCT: 32 %
Lymphs Abs: 2.1 10*3/uL (ref 0.7–4.0)
MCH: 31.3 pg (ref 26.0–34.0)
MCHC: 34 g/dL (ref 30.0–36.0)
MCV: 92 fL (ref 80.0–100.0)
MONO ABS: 0.6 10*3/uL (ref 0.1–1.0)
Monocytes Relative: 9 %
NEUTROS ABS: 3.5 10*3/uL (ref 1.7–7.7)
NRBC: 0 % (ref 0.0–0.2)
Neutrophils Relative %: 54 %
PLATELETS: 298 10*3/uL (ref 150–400)
RBC: 4.38 MIL/uL (ref 3.87–5.11)
RDW: 12.9 % (ref 11.5–15.5)
WBC: 6.5 10*3/uL (ref 4.0–10.5)

## 2018-05-16 MED FILL — ZOLPIDEM TARTRATE 10 MG TAB: 10 | 30 days supply | Qty: 30 | Fill #0

## 2018-05-24 LAB — BCR/ABL

## 2018-05-24 MED FILL — SYNTHROID 150 MCG TABLET: 150 | 90 days supply | Qty: 90 | Fill #2

## 2018-05-30 ENCOUNTER — Telehealth: Payer: Self-pay | Admitting: Hematology and Oncology

## 2018-05-30 ENCOUNTER — Encounter: Payer: Self-pay | Admitting: Hematology and Oncology

## 2018-05-30 ENCOUNTER — Inpatient Hospital Stay (HOSPITAL_BASED_OUTPATIENT_CLINIC_OR_DEPARTMENT_OTHER): Payer: Managed Care, Other (non HMO) | Admitting: Hematology and Oncology

## 2018-05-30 VITALS — BP 128/69 | HR 71 | Temp 97.6°F | Resp 18 | Ht 68.0 in | Wt 182.6 lb

## 2018-05-30 DIAGNOSIS — C9211 Chronic myeloid leukemia, BCR/ABL-positive, in remission: Secondary | ICD-10-CM

## 2018-05-30 DIAGNOSIS — Z23 Encounter for immunization: Secondary | ICD-10-CM | POA: Diagnosis not present

## 2018-05-30 DIAGNOSIS — C9210 Chronic myeloid leukemia, BCR/ABL-positive, not having achieved remission: Secondary | ICD-10-CM

## 2018-05-30 DIAGNOSIS — Z79899 Other long term (current) drug therapy: Secondary | ICD-10-CM | POA: Diagnosis not present

## 2018-05-30 MED ORDER — INFLUENZA VAC SPLIT QUAD 0.5 ML IM SUSY
0.5000 mL | PREFILLED_SYRINGE | Freq: Once | INTRAMUSCULAR | Status: AC
  Administered 2018-05-30: 0.5 mL via INTRAMUSCULAR

## 2018-05-30 MED ORDER — INFLUENZA VAC SPLIT QUAD 0.5 ML IM SUSY
PREFILLED_SYRINGE | INTRAMUSCULAR | Status: AC
  Filled 2018-05-30: qty 0.5

## 2018-05-30 NOTE — Assessment & Plan Note (Signed)
The patient has achieved major molecular response without detectable BCR/ABL transcripts She is comfortable to continue on reduced dose Sprycel 50 mg daily She tolerated treatment very well with no major side effects In the past, we have discussed the guidelines in terms of possibility of discontinuation of treatment but the patient is more comfortable to continue for now  We discussed the importance of preventive care and reviewed the vaccination programs. She does not have any prior allergic reactions to influenza vaccination. She agrees to proceed with influenza vaccination today and we will administer it today at the clinic.

## 2018-05-30 NOTE — Progress Notes (Signed)
[place] OFFICE PROGRESS NOTE  Patient Care Team: Panosh, Standley Brooking, MD as PCP - Marguerita Merles, MD (Endocrinology) [name], MD (Internal Medicine) Everett Graff, MD as Attending Physician (Obstetrics and Gynecology)  ASSESSMENT & PLAN:  CML (chronic myeloid leukemia) [facility]) The patient has achieved major molecular response without detectable BCR/ABL transcripts She is comfortable to continue on reduced dose Sprycel 50 mg daily She tolerated treatment very well with no major side effects In the past, we have discussed the guidelines in terms of possibility of discontinuation of treatment but the patient is more comfortable to continue for now  We discussed the importance of preventive care and reviewed the vaccination programs. She does not have any prior allergic reactions to influenza vaccination. She agrees to proceed with influenza vaccination today and we will administer it today at the clinic.    No orders of the defined types were placed in this encounter.   INTERVAL HISTORY: Please see below for problem oriented charting. She returns for further follow-up of CML She feels well No recent infection, fever or chills No shortness of breath Denies recent diarrhea.  SUMMARY OF ONCOLOGIC HISTORY:   CML (chronic myeloid leukemia) ([place])   05/15/2015 Bone Marrow Biopsy    BM biopsy and FISH confirmed CML [id number]    05/15/2015 - 05/20/2015 Chemotherapy    She started taking Hydrea 1500 mg daily    05/15/2015 Tumor Marker    BCR/ABL b2a2 87.5%, IS 49%    05/21/2015 -  Chemotherapy    She started taking Sprycel 100 mg daily    06/29/2015 Adverse Reaction    She had diarrhea. Dose of Srycel is reduced to 50 mg daily    09/10/2015 Tumor Marker    BCR/ABL b2a2 0.21%, IS 0.1743%    12/11/2015 Pathology Results    BCR/ABL b2a2 0.19%, IS 0.1577%    04/13/2016 Pathology Results    BCR/ABL b2a2 0.29%, IS 0.2407%    07/14/2016 Pathology Results     BCR/ABL b2a2 0.004%, IS 0.0092%. She is in MMR    07/21/2016 Miscellaneous    Dose of Sprycel is reduced to 50 mg daily    09/08/2016 Pathology Results    BCR/ABL b2a2 0.001%, IS 0.0023%. She is in MMR    06/14/2017 Pathology Results    BCR/ABL not detected. She is in MMR    10/11/2017 Pathology Results    BCR/ABL is not detected. She is in MMR    02/14/2018 Pathology Results    BCR/ABL is not detected. She is in MMR     REVIEW OF SYSTEMS:   Constitutional: Denies fevers, chills or abnormal weight loss Eyes: Denies blurriness of vision Ears, nose, mouth, throat, and face: Denies mucositis or sore throat Respiratory: Denies cough, dyspnea or wheezes Cardiovascular: Denies palpitation, chest discomfort or lower extremity swelling Gastrointestinal:  Denies nausea, heartburn or change in bowel habits Skin: Denies abnormal skin rashes Lymphatics: Denies new lymphadenopathy or easy bruising Neurological:Denies numbness, tingling or new weaknesses Behavioral/Psych: Mood is stable, no new changes  All other systems were reviewed with the patient and are negative.  I have reviewed the past medical history, past surgical history, social history and family history with the patient and they are unchanged from previous note.  ALLERGIES:  has No Known Allergies.  MEDICATIONS:  Current Outpatient Medications  Medication Sig Dispense Refill  . dasatinib (SPRYCEL) 50 MG tablet Take 1 tablet (50 mg total) daily by mouth. 90 tablet 3  . losartan (COZAAR)  50 MG tablet Take 1 tablet (50 mg total) by mouth daily. 90 tablet 1  . SYNTHROID 150 MCG tablet Take 1 tablet (150 mcg total) by mouth daily before breakfast. 90 tablet 3  . verapamil (CALAN-SR) 240 MG CR tablet Take 1 tablet (240 mg total) by mouth at bedtime. 90 tablet 1  . zolpidem (AMBIEN) 10 MG tablet Take 1 tablet (10 mg total) by mouth at bedtime as needed. for sleep 30 tablet 0   No current facility-administered medications for this  visit.     PHYSICAL EXAMINATION: ECOG PERFORMANCE STATUS: 0 - Asymptomatic  Vitals:   05/30/18 0801  BP: 128/69  Pulse: 71  Resp: 18  Temp: 97.6 F (36.4 C)  SpO2: 99%   Filed Weights   05/30/18 0801  Weight: 182 lb 9.6 oz (82.8 kg)    GENERAL:alert, no distress and comfortable SKIN: skin color, texture, turgor are normal, no rashes or significant lesions EYES: normal, Conjunctiva are pink and non-injected, sclera clear OROPHARYNX:no exudate, no erythema and lips, buccal mucosa, and tongue normal  NECK: supple, thyroid normal size, non-tender, without nodularity LYMPH:  no palpable lymphadenopathy in the cervical, axillary or inguinal LUNGS: clear to auscultation and percussion with normal breathing effort HEART: regular rate & rhythm and no murmurs and no lower extremity edema ABDOMEN:abdomen soft, non-tender and normal bowel sounds Musculoskeletal:no cyanosis of digits and no clubbing  NEURO: alert & oriented x 3 with fluent speech, no focal motor/sensory deficits  LABORATORY DATA:  I have reviewed the data as listed    Component Value Date/Time   NA 138 05/16/2018 0743   NA 138 02/07/2017 0836   K 4.2 05/16/2018 0743   K 4.2 02/07/2017 0836   CL 105 05/16/2018 0743   CO2 24 05/16/2018 0743   CO2 23 02/07/2017 0836   GLUCOSE 93 05/16/2018 0743   GLUCOSE 85 02/07/2017 0836   BUN 13 05/16/2018 0743   BUN 15.5 02/07/2017 0836   CREATININE 0.88 05/16/2018 0743   CREATININE 0.9 02/07/2017 0836   CALCIUM 9.5 05/16/2018 0743   CALCIUM 9.5 02/07/2017 0836   PROT 7.1 05/16/2018 0743   PROT 7.0 02/07/2017 0836   ALBUMIN 3.9 05/16/2018 0743   ALBUMIN 3.8 02/07/2017 0836   AST 18 05/16/2018 0743   AST 25 02/07/2017 0836   ALT 19 05/16/2018 0743   ALT 36 02/07/2017 0836   ALKPHOS 57 05/16/2018 0743   ALKPHOS 55 02/07/2017 0836   BILITOT 0.4 05/16/2018 0743   BILITOT 0.31 02/07/2017 0836   GFRNONAA >60 05/16/2018 0743   GFRAA >60 05/16/2018 0743    No results  found for: SPEP, UPEP  Lab Results  Component Value Date   WBC 6.5 05/16/2018   NEUTROABS 3.5 05/16/2018   HGB 13.7 05/16/2018   HCT 40.3 05/16/2018   MCV 92.0 05/16/2018   PLT 298 05/16/2018      Chemistry      Component Value Date/Time   NA 138 05/16/2018 0743   NA 138 02/07/2017 0836   K 4.2 05/16/2018 0743   K 4.2 02/07/2017 0836   CL 105 05/16/2018 0743   CO2 24 05/16/2018 0743   CO2 23 02/07/2017 0836   BUN 13 05/16/2018 0743   BUN 15.5 02/07/2017 0836   CREATININE 0.88 05/16/2018 0743   CREATININE 0.9 02/07/2017 0836      Component Value Date/Time   CALCIUM 9.5 05/16/2018 0743   CALCIUM 9.5 02/07/2017 0836   ALKPHOS 57 05/16/2018 0743   ALKPHOS  55 02/07/2017 0836   AST 18 05/16/2018 0743   AST 25 02/07/2017 0836   ALT 19 05/16/2018 0743   ALT 36 02/07/2017 0836   BILITOT 0.4 05/16/2018 0743   BILITOT 0.31 02/07/2017 0836       All questions were answered. The patient knows to call the clinic with any problems, questions or concerns. No barriers to learning was detected.  I spent 10 minutes counseling the patient face to face. The total time spent in the appointment was 15 minutes and more than 50% was on counseling and review of test results  Heath Lark, MD 05/30/2018 8:34 AM

## 2018-05-30 NOTE — Telephone Encounter (Signed)
Per 10/23 patient decline avs and calendar

## 2018-06-04 MED FILL — SPRYCEL 50 MG TABLET: 50 | 30 days supply | Qty: 30 | Fill #6

## 2018-06-11 ENCOUNTER — Other Ambulatory Visit: Payer: Self-pay | Admitting: Adult Health

## 2018-06-11 NOTE — Telephone Encounter (Signed)
Sent in electronically .  

## 2018-06-11 NOTE — Telephone Encounter (Signed)
Please send in 90 day refills of Zolpidem to [facility]

## 2018-06-11 NOTE — Telephone Encounter (Signed)
Please advise Dr Panosh, thanks.   

## 2018-06-13 MED FILL — ZOLPIDEM TARTRATE 10 MG TAB: 10 | 90 days supply | Qty: 90 | Fill #0

## 2018-07-03 ENCOUNTER — Other Ambulatory Visit: Payer: Self-pay | Admitting: Hematology and Oncology

## 2018-07-04 MED FILL — SPRYCEL 50 MG TABLET: 50 | 30 days supply | Qty: 30 | Fill #0

## 2018-07-12 MED FILL — LOSARTAN POTASSIUM 50 MG TA: 50 | 90 days supply | Qty: 90 | Fill #1

## 2018-07-18 MED FILL — VERAPAMIL ER 240 MG TABLET: 240 | 90 days supply | Qty: 90 | Fill #1

## 2018-07-27 ENCOUNTER — Telehealth: Payer: Self-pay | Admitting: Family Medicine

## 2018-07-27 ENCOUNTER — Other Ambulatory Visit: Payer: Self-pay

## 2018-07-27 MED ORDER — AMOXICILLIN-POT CLAVULANATE 875-125 MG PO TABS: 1.0000 | ORAL_TABLET | Freq: Two times a day (BID) | ORAL | 0 refills | Status: AC

## 2018-07-27 NOTE — Telephone Encounter (Signed)
OK to send in Augmentin 875 mg po bid for 10 days.

## 2018-07-27 NOTE — Telephone Encounter (Signed)
She has had sinus issues for 2 weeks. Please call in Augmentin [phone] for 10 days to [facility] on [address]

## 2018-07-27 NOTE — Telephone Encounter (Signed)
Prescription has been sent to the requested pharmacy. ?

## 2018-07-30 MED FILL — SPRYCEL 50 MG TABLET: 50 | 30 days supply | Qty: 30 | Fill #1

## 2018-08-22 ENCOUNTER — Inpatient Hospital Stay: Payer: 59 | Attending: Hematology and Oncology

## 2018-08-22 DIAGNOSIS — Z8041 Family history of malignant neoplasm of ovary: Secondary | ICD-10-CM | POA: Insufficient documentation

## 2018-08-22 DIAGNOSIS — C9210 Chronic myeloid leukemia, BCR/ABL-positive, not having achieved remission: Secondary | ICD-10-CM | POA: Diagnosis not present

## 2018-08-22 DIAGNOSIS — Z79899 Other long term (current) drug therapy: Secondary | ICD-10-CM | POA: Insufficient documentation

## 2018-08-22 DIAGNOSIS — C9211 Chronic myeloid leukemia, BCR/ABL-positive, in remission: Secondary | ICD-10-CM | POA: Diagnosis not present

## 2018-08-22 LAB — CBC WITH DIFFERENTIAL/PLATELET
Abs Immature Granulocytes: 0.01 10*3/uL (ref 0.00–0.07)
Basophils Absolute: 0.1 10*3/uL (ref 0.0–0.1)
Basophils Relative: 1 %
EOS PCT: 6 %
Eosinophils Absolute: 0.4 10*3/uL (ref 0.0–0.5)
HEMATOCRIT: 40 % (ref 36.0–46.0)
HEMOGLOBIN: 13.6 g/dL (ref 12.0–15.0)
Immature Granulocytes: 0 %
LYMPHS ABS: 2.1 10*3/uL (ref 0.7–4.0)
LYMPHS PCT: 34 %
MCH: 31.3 pg (ref 26.0–34.0)
MCHC: 34 g/dL (ref 30.0–36.0)
MCV: 92 fL (ref 80.0–100.0)
MONO ABS: 0.5 10*3/uL (ref 0.1–1.0)
Monocytes Relative: 8 %
Neutro Abs: 3 10*3/uL (ref 1.7–7.7)
Neutrophils Relative %: 51 %
Platelets: 233 10*3/uL (ref 150–400)
RBC: 4.35 MIL/uL (ref 3.87–5.11)
RDW: 12.7 % (ref 11.5–15.5)
WBC: 6 10*3/uL (ref 4.0–10.5)
nRBC: 0 % (ref 0.0–0.2)

## 2018-08-22 LAB — COMPREHENSIVE METABOLIC PANEL
ALT: 24 U/L (ref 0–44)
AST: 19 U/L (ref 15–41)
Albumin: 3.9 g/dL (ref 3.5–5.0)
Alkaline Phosphatase: 55 U/L (ref 38–126)
Anion gap: 10 (ref 5–15)
BUN: 17 mg/dL (ref 6–20)
CHLORIDE: 105 mmol/L (ref 98–111)
CO2: 23 mmol/L (ref 22–32)
CREATININE: 0.89 mg/dL (ref 0.44–1.00)
Calcium: 9 mg/dL (ref 8.9–10.3)
GFR calc Af Amer: >60 mL/min (ref >60)
GLUCOSE: 88 mg/dL (ref 70–99)
Potassium: 4.2 mmol/L (ref 3.5–5.1)
SODIUM: 138 mmol/L (ref 135–145)
Total Bilirubin: 0.4 mg/dL (ref 0.3–1.2)
Total Protein: 6.9 g/dL (ref 6.5–8.1)

## 2018-08-28 MED FILL — SYNTHROID 150 MCG TABLET: 150 | 90 days supply | Qty: 90 | Fill #3

## 2018-08-31 LAB — BCR/ABL

## 2018-09-05 ENCOUNTER — Telehealth: Payer: Self-pay

## 2018-09-05 MED FILL — SPRYCEL 50 MG TABLET: 50 | 30 days supply | Qty: 30 | Fill #2

## 2018-09-05 NOTE — Telephone Encounter (Signed)
Oral Oncology Patient Advocate Lala Lund, RN contacted me in regards to the patient not being able to afford her $500 copay for Sprycel at [facility]. I was able to secure her a copay card and this made her out of pocket cost $0. The copay card information is as follows and has been shared with [place].  BIN: [id number] PCN: [id number] GRP: [id number] ID: [id number]  I called the patient and gave her this information, she would like to pick it up today so I called the pharmacy and asked them to get it ready for her. [facility] will fill for pick up today with a $0 copay. The patient verbalized understanding and great appreciation.  [place] Patient [place] Phone [phone] Fax [phone]

## 2018-09-06 ENCOUNTER — Inpatient Hospital Stay (HOSPITAL_BASED_OUTPATIENT_CLINIC_OR_DEPARTMENT_OTHER): Payer: 59 | Admitting: Hematology and Oncology

## 2018-09-06 ENCOUNTER — Encounter: Payer: Self-pay | Admitting: Hematology and Oncology

## 2018-09-06 ENCOUNTER — Telehealth: Payer: Self-pay | Admitting: Hematology and Oncology

## 2018-09-06 DIAGNOSIS — C9210 Chronic myeloid leukemia, BCR/ABL-positive, not having achieved remission: Secondary | ICD-10-CM | POA: Diagnosis not present

## 2018-09-06 DIAGNOSIS — Z8041 Family history of malignant neoplasm of ovary: Secondary | ICD-10-CM | POA: Diagnosis not present

## 2018-09-06 DIAGNOSIS — Z79899 Other long term (current) drug therapy: Secondary | ICD-10-CM | POA: Diagnosis not present

## 2018-09-06 NOTE — Assessment & Plan Note (Signed)
Her mother was recently diagnosed with ovarian cancer.  She is 42 Her mother will be undergoing genetic testing soon.  I recommend her to get a copy of the report If she tested positive for genetic abnormalities, we will get the patient to see our counselor here to pursue further work-up.  She agreed with the plan of care

## 2018-09-06 NOTE — Assessment & Plan Note (Signed)
The patient has achieved major molecular response without detectable BCR/ABL transcripts She is comfortable to continue on reduced dose Sprycel 50 mg daily She tolerated treatment very well with no major side effects She will continue the same treatment without dose adjustment.   I will continue to see her every 3 months with blood work to be done ahead of time  

## 2018-09-06 NOTE — Progress Notes (Signed)
[place] OFFICE PROGRESS NOTE  Patient Care Team: Panosh, Standley Brooking, MD as PCP - Marguerita Merles, MD (Endocrinology) [name], MD (Internal Medicine) Everett Graff, MD as Attending Physician (Obstetrics and Gynecology)  ASSESSMENT & PLAN:  CML (chronic myeloid leukemia) [facility]) The patient has achieved major molecular response without detectable BCR/ABL transcripts She is comfortable to continue on reduced dose Sprycel 50 mg daily She tolerated treatment very well with no major side effects She will continue the same treatment without dose adjustment.  I will continue to see her every 3 months with blood work to be done ahead of time  Family history of ovarian cancer Her mother was recently diagnosed with ovarian cancer.  She is 90 Her mother will be undergoing genetic testing soon.  I recommend her to get a copy of the report If she tested positive for genetic abnormalities, we will get the patient to see our counselor here to pursue further work-up.  She agreed with the plan of care   No orders of the defined types were placed in this encounter.   INTERVAL HISTORY: Please see below for problem oriented charting. She returns for further follow-up. She is compliant taking Sprycel as directed She has no side effects such as diarrhea, shortness of breath or fluid retention. No recent infection, fever or chills She shares with me that her mother was recently diagnosed with ovarian cancer and is undergoing chemotherapy treatment  SUMMARY OF ONCOLOGIC HISTORY:   CML (chronic myeloid leukemia) ([place])   05/15/2015 Bone Marrow Biopsy    BM biopsy and FISH confirmed CML [id number]    05/15/2015 - 05/20/2015 Chemotherapy    She started taking Hydrea 1500 mg daily    05/15/2015 Tumor Marker    BCR/ABL b2a2 87.5%, IS 49%    05/21/2015 -  Chemotherapy    She started taking Sprycel 100 mg daily    06/29/2015 Adverse Reaction    She had diarrhea. Dose of  Srycel is reduced to 50 mg daily    09/10/2015 Tumor Marker    BCR/ABL b2a2 0.21%, IS 0.1743%    12/11/2015 Pathology Results    BCR/ABL b2a2 0.19%, IS 0.1577%    04/13/2016 Pathology Results    BCR/ABL b2a2 0.29%, IS 0.2407%    07/14/2016 Pathology Results    BCR/ABL b2a2 0.004%, IS 0.0092%. She is in MMR    07/21/2016 Miscellaneous    Dose of Sprycel is reduced to 50 mg daily    09/08/2016 Pathology Results    BCR/ABL b2a2 0.001%, IS 0.0023%. She is in MMR    06/14/2017 Pathology Results    BCR/ABL not detected. She is in MMR    10/11/2017 Pathology Results    BCR/ABL is not detected. She is in MMR    02/14/2018 Pathology Results    BCR/ABL is not detected. She is in MMR    05/17/2018 Pathology Results    BCR/ABL is not detected. She is in MMR    08/22/2018 Pathology Results    BCR/ABL is not detected. She is in MMR     REVIEW OF SYSTEMS:   Constitutional: Denies fevers, chills or abnormal weight loss Eyes: Denies blurriness of vision Ears, nose, mouth, throat, and face: Denies mucositis or sore throat Respiratory: Denies cough, dyspnea or wheezes Cardiovascular: Denies palpitation, chest discomfort or lower extremity swelling Gastrointestinal:  Denies nausea, heartburn or change in bowel habits Skin: Denies abnormal skin rashes Lymphatics: Denies new lymphadenopathy or easy bruising Neurological:Denies numbness, tingling or new  weaknesses Behavioral/Psych: Mood is stable, no new changes  All other systems were reviewed with the patient and are negative.  I have reviewed the past medical history, past surgical history, social history and family history with the patient and they are unchanged from previous note.  ALLERGIES:  has No Known Allergies.  MEDICATIONS:  Current Outpatient Medications  Medication Sig Dispense Refill  . losartan (COZAAR) 50 MG tablet Take 1 tablet (50 mg total) by mouth daily. 90 tablet 1  . SPRYCEL 50 MG tablet TAKE 1 TABLET (50 MG TOTAL) BY  MOUTH DAILY. 90 tablet 3  . SYNTHROID 150 MCG tablet Take 1 tablet (150 mcg total) by mouth daily before breakfast. 90 tablet 3  . verapamil (CALAN-SR) 240 MG CR tablet Take 1 tablet (240 mg total) by mouth at bedtime. 90 tablet 1  . zolpidem (AMBIEN) 10 MG tablet TAKE 1 TABLET (10 MG TOTAL) BY MOUTH AT BEDTIME AS NEEDED FOR SLEEP 90 tablet 0   No current facility-administered medications for this visit.     PHYSICAL EXAMINATION: ECOG PERFORMANCE STATUS: 0 - Asymptomatic  Vitals:   09/06/18 0824  BP: 128/70  Pulse: 73  Resp: 18  Temp: 98 F (36.7 C)  SpO2: 99%   Filed Weights   09/06/18 0824  Weight: 182 lb 3.2 oz (82.6 kg)    GENERAL:alert, no distress and comfortable SKIN: skin color, texture, turgor are normal, no rashes or significant lesions EYES: normal, Conjunctiva are pink and non-injected, sclera clear OROPHARYNX:no exudate, no erythema and lips, buccal mucosa, and tongue normal  NECK: supple, thyroid normal size, non-tender, without nodularity LYMPH:  no palpable lymphadenopathy in the cervical, axillary or inguinal LUNGS: clear to auscultation and percussion with normal breathing effort HEART: regular rate & rhythm and no murmurs and no lower extremity edema ABDOMEN:abdomen soft, non-tender and normal bowel sounds Musculoskeletal:no cyanosis of digits and no clubbing  NEURO: alert & oriented x 3 with fluent speech, no focal motor/sensory deficits  LABORATORY DATA:  I have reviewed the data as listed    Component Value Date/Time   NA 138 08/22/2018 0750   NA 138 02/07/2017 0836   K 4.2 08/22/2018 0750   K 4.2 02/07/2017 0836   CL 105 08/22/2018 0750   CO2 23 08/22/2018 0750   CO2 23 02/07/2017 0836   GLUCOSE 88 08/22/2018 0750   GLUCOSE 85 02/07/2017 0836   BUN 17 08/22/2018 0750   BUN 15.5 02/07/2017 0836   CREATININE 0.89 08/22/2018 0750   CREATININE 0.9 02/07/2017 0836   CALCIUM 9.0 08/22/2018 0750   CALCIUM 9.5 02/07/2017 0836   PROT 6.9  08/22/2018 0750   PROT 7.0 02/07/2017 0836   ALBUMIN 3.9 08/22/2018 0750   ALBUMIN 3.8 02/07/2017 0836   AST 19 08/22/2018 0750   AST 25 02/07/2017 0836   ALT 24 08/22/2018 0750   ALT 36 02/07/2017 0836   ALKPHOS 55 08/22/2018 0750   ALKPHOS 55 02/07/2017 0836   BILITOT 0.4 08/22/2018 0750   BILITOT 0.31 02/07/2017 0836   GFRNONAA >60 08/22/2018 0750   GFRAA >60 08/22/2018 0750    No results found for: SPEP, UPEP  Lab Results  Component Value Date   WBC 6.0 08/22/2018   NEUTROABS 3.0 08/22/2018   HGB 13.6 08/22/2018   HCT 40.0 08/22/2018   MCV 92.0 08/22/2018   PLT 233 08/22/2018      Chemistry      Component Value Date/Time   NA 138 08/22/2018 0750   NA  138 02/07/2017 0836   K 4.2 08/22/2018 0750   K 4.2 02/07/2017 0836   CL 105 08/22/2018 0750   CO2 23 08/22/2018 0750   CO2 23 02/07/2017 0836   BUN 17 08/22/2018 0750   BUN 15.5 02/07/2017 0836   CREATININE 0.89 08/22/2018 0750   CREATININE 0.9 02/07/2017 0836      Component Value Date/Time   CALCIUM 9.0 08/22/2018 0750   CALCIUM 9.5 02/07/2017 0836   ALKPHOS 55 08/22/2018 0750   ALKPHOS 55 02/07/2017 0836   AST 19 08/22/2018 0750   AST 25 02/07/2017 0836   ALT 24 08/22/2018 0750   ALT 36 02/07/2017 0836   BILITOT 0.4 08/22/2018 0750   BILITOT 0.31 02/07/2017 0836      All questions were answered. The patient knows to call the clinic with any problems, questions or concerns. No barriers to learning was detected.  I spent 10 minutes counseling the patient face to face. The total time spent in the appointment was 15 minutes and more than 50% was on counseling and review of test results  Heath Lark, MD 09/06/2018 8:34 AM

## 2018-09-06 NOTE — Telephone Encounter (Signed)
Gave avs and calendar ° °

## 2018-09-10 ENCOUNTER — Other Ambulatory Visit: Payer: Self-pay | Admitting: Internal Medicine

## 2018-09-10 NOTE — Telephone Encounter (Signed)
Last filled: 09/27/17 Last OV: 06/11/18 No upcoming appt.

## 2018-09-11 MED FILL — ZOLPIDEM TARTRATE 10 MG TAB: 10 | 90 days supply | Qty: 90 | Fill #0

## 2018-09-11 NOTE — Telephone Encounter (Signed)
Sent in electronically .  

## 2018-09-11 NOTE — Telephone Encounter (Signed)
Please call in 90 day refills for Zolpidem to [place]

## 2018-09-12 DIAGNOSIS — R52 Pain, unspecified: Secondary | ICD-10-CM | POA: Diagnosis not present

## 2018-10-01 MED FILL — SPRYCEL 50 MG TABLET: 50 | 30 days supply | Qty: 30 | Fill #3

## 2018-10-09 ENCOUNTER — Telehealth: Payer: Self-pay | Admitting: Internal Medicine

## 2018-10-09 DIAGNOSIS — R0683 Snoring: Secondary | ICD-10-CM

## 2018-10-09 DIAGNOSIS — R5383 Other fatigue: Secondary | ICD-10-CM

## 2018-10-09 MED FILL — LOSARTAN POTASSIUM 50 MG TA: 50 | 90 days supply | Qty: 90 | Fill #0

## 2018-10-10 NOTE — Telephone Encounter (Signed)
Pt would like referral to pulmonary for a sleep test for possible sleep apnea her husband says she snores a lot and often stops breathing in her sleep also is tired and sleepy all day .

## 2018-10-10 NOTE — Telephone Encounter (Signed)
I have  placed for referral to  Pulmonary  For evaluation

## 2018-10-22 ENCOUNTER — Other Ambulatory Visit: Payer: Self-pay | Admitting: Family Medicine

## 2018-10-22 MED ORDER — VERAPAMIL HCL ER 240 MG PO TBCR: 240.0000 mg | EXTENDED_RELEASE_TABLET | Freq: Every day | ORAL | 1 refills | Status: DC

## 2018-10-22 MED FILL — VERAPAMIL ER 240 MG TABLET: 240 | 90 days supply | Qty: 90 | Fill #0

## 2018-10-31 DIAGNOSIS — M1812 Unilateral primary osteoarthritis of first carpometacarpal joint, left hand: Secondary | ICD-10-CM | POA: Diagnosis not present

## 2018-10-31 DIAGNOSIS — M1811 Unilateral primary osteoarthritis of first carpometacarpal joint, right hand: Secondary | ICD-10-CM | POA: Diagnosis not present

## 2018-10-31 MED FILL — SPRYCEL 50 MG TABLET: 50 | 30 days supply | Qty: 30 | Fill #4

## 2018-11-14 DIAGNOSIS — M1811 Unilateral primary osteoarthritis of first carpometacarpal joint, right hand: Secondary | ICD-10-CM | POA: Diagnosis not present

## 2018-11-14 DIAGNOSIS — M1812 Unilateral primary osteoarthritis of first carpometacarpal joint, left hand: Secondary | ICD-10-CM | POA: Diagnosis not present

## 2018-11-23 ENCOUNTER — Other Ambulatory Visit: Payer: Self-pay | Admitting: Internal Medicine

## 2018-11-23 DIAGNOSIS — Z8639 Personal history of other endocrine, nutritional and metabolic disease: Secondary | ICD-10-CM

## 2018-11-23 DIAGNOSIS — E039 Hypothyroidism, unspecified: Secondary | ICD-10-CM

## 2018-11-23 MED FILL — SPRYCEL 50 MG TABLET: 50 | 30 days supply | Qty: 30 | Fill #5

## 2018-11-26 ENCOUNTER — Telehealth: Payer: Self-pay

## 2018-11-26 MED FILL — SYNTHROID 150 MCG TABLET: 150 | 90 days supply | Qty: 90 | Fill #0

## 2018-11-26 NOTE — Telephone Encounter (Signed)
Oral Oncology Patient Advocate Encounter  Received notification from [place] that the existing prior authorization for Sprycel is due for renewal.  Renewal PA submitted on CoverMyMeds Key [id number] Status is pending  Oral Oncology Clinic will continue to follow.  [place] Patient [place] Phone [phone] Fax [phone] 11/26/2018    9:41 AM

## 2018-11-26 NOTE — Telephone Encounter (Signed)
Oral Oncology Patient Advocate Encounter  Prior Authorization for Sprycel has been approved.    PA# [id number] Effective dates: 11/26/18 through 11/25/19  Oral Oncology Clinic will continue to follow.   [place] Patient [place] Phone [phone] Fax [phone] 11/26/2018    2:28 PM

## 2018-11-30 ENCOUNTER — Other Ambulatory Visit: Payer: Self-pay

## 2018-11-30 ENCOUNTER — Inpatient Hospital Stay: Payer: 59 | Attending: Hematology and Oncology

## 2018-11-30 DIAGNOSIS — M25541 Pain in joints of right hand: Secondary | ICD-10-CM | POA: Diagnosis not present

## 2018-11-30 DIAGNOSIS — Z8041 Family history of malignant neoplasm of ovary: Secondary | ICD-10-CM | POA: Insufficient documentation

## 2018-11-30 DIAGNOSIS — C9210 Chronic myeloid leukemia, BCR/ABL-positive, not having achieved remission: Secondary | ICD-10-CM | POA: Insufficient documentation

## 2018-11-30 DIAGNOSIS — M25542 Pain in joints of left hand: Secondary | ICD-10-CM | POA: Diagnosis not present

## 2018-11-30 LAB — CBC WITH DIFFERENTIAL/PLATELET
Abs Immature Granulocytes: 0.02 10*3/uL (ref 0.00–0.07)
Basophils Absolute: 0.1 10*3/uL (ref 0.0–0.1)
Basophils Relative: 1 %
Eosinophils Absolute: 0.3 10*3/uL (ref 0.0–0.5)
Eosinophils Relative: 5 %
HCT: 39.1 % (ref 36.0–46.0)
Hemoglobin: 13.3 g/dL (ref 12.0–15.0)
Immature Granulocytes: 0 %
Lymphocytes Relative: 32 %
Lymphs Abs: 2.1 10*3/uL (ref 0.7–4.0)
MCH: 32 pg (ref 26.0–34.0)
MCHC: 34 g/dL (ref 30.0–36.0)
MCV: 94 fL (ref 80.0–100.0)
Monocytes Absolute: 0.5 10*3/uL (ref 0.1–1.0)
Monocytes Relative: 8 %
Neutro Abs: 3.5 10*3/uL (ref 1.7–7.7)
Neutrophils Relative %: 54 %
Platelets: 254 10*3/uL (ref 150–400)
RBC: 4.16 MIL/uL (ref 3.87–5.11)
RDW: 13.2 % (ref 11.5–15.5)
WBC: 6.4 10*3/uL (ref 4.0–10.5)
nRBC: 0 % (ref 0.0–0.2)

## 2018-11-30 LAB — COMPREHENSIVE METABOLIC PANEL
ALT: 23 U/L (ref 0–44)
AST: 22 U/L (ref 15–41)
Albumin: 3.8 g/dL (ref 3.5–5.0)
Alkaline Phosphatase: 52 U/L (ref 38–126)
Anion gap: 13 (ref 5–15)
BUN: 16 mg/dL (ref 6–20)
CO2: 21 mmol/L — ABNORMAL LOW (ref 22–32)
Calcium: 8.9 mg/dL (ref 8.9–10.3)
Chloride: 104 mmol/L (ref 98–111)
Creatinine, Ser: 0.85 mg/dL (ref 0.44–1.00)
GFR calc Af Amer: >60 mL/min (ref >60)
GFR calc non Af Amer: >60 mL/min (ref >60)
Glucose, Bld: 100 mg/dL — ABNORMAL HIGH (ref 70–99)
Potassium: 4.1 mmol/L (ref 3.5–5.1)
Sodium: 138 mmol/L (ref 135–145)
Total Bilirubin: 0.3 mg/dL (ref 0.3–1.2)
Total Protein: 7 g/dL (ref 6.5–8.1)

## 2018-12-06 ENCOUNTER — Encounter: Payer: Self-pay | Admitting: Hematology and Oncology

## 2018-12-06 ENCOUNTER — Inpatient Hospital Stay (HOSPITAL_BASED_OUTPATIENT_CLINIC_OR_DEPARTMENT_OTHER): Payer: 59 | Admitting: Hematology and Oncology

## 2018-12-06 ENCOUNTER — Telehealth: Payer: Self-pay

## 2018-12-06 ENCOUNTER — Other Ambulatory Visit: Payer: Self-pay

## 2018-12-06 DIAGNOSIS — C9210 Chronic myeloid leukemia, BCR/ABL-positive, not having achieved remission: Secondary | ICD-10-CM

## 2018-12-06 DIAGNOSIS — M25542 Pain in joints of left hand: Secondary | ICD-10-CM | POA: Diagnosis not present

## 2018-12-06 DIAGNOSIS — Z8041 Family history of malignant neoplasm of ovary: Secondary | ICD-10-CM | POA: Diagnosis not present

## 2018-12-06 DIAGNOSIS — M25541 Pain in joints of right hand: Secondary | ICD-10-CM | POA: Diagnosis not present

## 2018-12-06 NOTE — Assessment & Plan Note (Signed)
Her mother was diagnosed with ovarian cancer According to her, genetic testing was negative She has not had screening Pap smear for some time I recommend consideration for referral to see gynecologist for pelvic exam, repeat Pap smear and to consider one-time pelvic ultrasound She will discuss this with her primary care doctor for referral

## 2018-12-06 NOTE — Progress Notes (Signed)
[place] OFFICE PROGRESS NOTE  Patient Care Team: Panosh, Standley Brooking, MD as PCP - Marguerita Merles, MD (Endocrinology) [name], MD (Internal Medicine) Everett Graff, MD as Attending Physician (Obstetrics and Gynecology)  ASSESSMENT & PLAN:  CML (chronic myeloid leukemia) [facility]) The patient has achieved major molecular response without detectable BCR/ABL transcripts She is comfortable to continue on reduced dose Sprycel 50 mg daily She tolerated treatment very well with no major side effects She will continue the same treatment without dose adjustment.   I will continue to see her every 3 months with blood work to be done ahead of time I will get our oral chemotherapy navigators to help with payment assistance  Family history of ovarian cancer Her mother was diagnosed with ovarian cancer According to her, genetic testing was negative She has not had screening Pap smear for some time I recommend consideration for referral to see gynecologist for pelvic exam, repeat Pap smear and to consider one-time pelvic ultrasound She will discuss this with her primary care doctor for referral  Hand joint pain She had recent flare of joint pain I do not believe this is due to side effects of Sprycel I recommend trial of vitamin D supplement   No orders of the defined types were placed in this encounter.   INTERVAL HISTORY: Please see below for problem oriented charting. She returns for further follow-up She complained of recent joint pain but no other pain elsewhere It is usually focusing her hand She had received joint injection that does not seem to help She has questions about screening for ovarian cancer.  Her mother tested negative for inheritable genetic disorder for ovarian cancer She did not remember when was her last pelvic exam/Pap smear.  According to internal records, the last cytology from Pap smear was in 2013 She denies recent abdominal  discomfort or abnormal vaginal bleeding She tolerated chemotherapy well No recent shortness of breath, cough or fluid retention Denies recent infection, fever or chills She is inquiring about payment assistant by pharmacy for Sprycel  SUMMARY OF ONCOLOGIC HISTORY:   CML (chronic myeloid leukemia) ([place])   05/15/2015 Bone Marrow Biopsy    BM biopsy and FISH confirmed CML [id number]    05/15/2015 - 05/20/2015 Chemotherapy    She started taking Hydrea 1500 mg daily    05/15/2015 Tumor Marker    BCR/ABL b2a2 87.5%, IS 49%    05/21/2015 -  Chemotherapy    She started taking Sprycel 100 mg daily    06/29/2015 Adverse Reaction    She had diarrhea. Dose of Srycel is reduced to 50 mg daily    09/10/2015 Tumor Marker    BCR/ABL b2a2 0.21%, IS 0.1743%    12/11/2015 Pathology Results    BCR/ABL b2a2 0.19%, IS 0.1577%    04/13/2016 Pathology Results    BCR/ABL b2a2 0.29%, IS 0.2407%    07/14/2016 Pathology Results    BCR/ABL b2a2 0.004%, IS 0.0092%. She is in MMR    07/21/2016 Miscellaneous    Dose of Sprycel is reduced to 50 mg daily    09/08/2016 Pathology Results    BCR/ABL b2a2 0.001%, IS 0.0023%. She is in MMR    06/14/2017 Pathology Results    BCR/ABL not detected. She is in MMR    10/11/2017 Pathology Results    BCR/ABL is not detected. She is in MMR    02/14/2018 Pathology Results    BCR/ABL is not detected. She is in MMR    05/17/2018 Pathology  Results    BCR/ABL is not detected. She is in MMR    08/22/2018 Pathology Results    BCR/ABL is not detected. She is in MMR    11/30/2018 Pathology Results    BCR/ABL is not detected. She is in MMR     REVIEW OF SYSTEMS:   Constitutional: Denies fevers, chills or abnormal weight loss Eyes: Denies blurriness of vision Ears, nose, mouth, throat, and face: Denies mucositis or sore throat Respiratory: Denies cough, dyspnea or wheezes Cardiovascular: Denies palpitation, chest discomfort or lower extremity swelling Gastrointestinal:   Denies nausea, heartburn or change in bowel habits Skin: Denies abnormal skin rashes Lymphatics: Denies new lymphadenopathy or easy bruising Neurological:Denies numbness, tingling or new weaknesses Behavioral/Psych: Mood is stable, no new changes  All other systems were reviewed with the patient and are negative.  I have reviewed the past medical history, past surgical history, social history and family history with the patient and they are unchanged from previous note.  ALLERGIES:  has No Known Allergies.  MEDICATIONS:  Current Outpatient Medications  Medication Sig Dispense Refill  . losartan (COZAAR) 50 MG tablet TAKE 1 TABLET BY MOUTH DAILY. 90 tablet 1  . SPRYCEL 50 MG tablet TAKE 1 TABLET (50 MG TOTAL) BY MOUTH DAILY. 90 tablet 3  . SYNTHROID 150 MCG tablet TAKE 1 TABLET (150 MCG TOTAL) BY MOUTH DAILY BEFORE BREAKFAST. 90 tablet 3  . verapamil (CALAN-SR) 240 MG CR tablet Take 1 tablet (240 mg total) by mouth at bedtime. 90 tablet 1  . zolpidem (AMBIEN) 10 MG tablet TAKE 1 TABLET BY MOUTH AT BEDTIME AS NEEDED FOR SLEEP 90 tablet 1   No current facility-administered medications for this visit.     PHYSICAL EXAMINATION: ECOG PERFORMANCE STATUS: 1 - Symptomatic but completely ambulatory  Vitals:   12/06/18 0843  BP: 135/72  Pulse: 73  Resp: 18  Temp: 97.9 F (36.6 C)  SpO2: 100%   Filed Weights   12/06/18 0843  Weight: 185 lb 6.4 oz (84.1 kg)    GENERAL:alert, no distress and comfortable SKIN: skin color, texture, turgor are normal, no rashes or significant lesions EYES: normal, Conjunctiva are pink and non-injected, sclera clear OROPHARYNX:no exudate, no erythema and lips, buccal mucosa, and tongue normal  NECK: supple, thyroid normal size, non-tender, without nodularity LYMPH:  no palpable lymphadenopathy in the cervical, axillary or inguinal LUNGS: clear to auscultation and percussion with normal breathing effort HEART: regular rate & rhythm and no murmurs and no  lower extremity edema ABDOMEN:abdomen soft, non-tender and normal bowel sounds Musculoskeletal:no cyanosis of digits and no clubbing  NEURO: alert & oriented x 3 with fluent speech, no focal motor/sensory deficits  LABORATORY DATA:  I have reviewed the data as listed    Component Value Date/Time   NA 138 11/30/2018 0736   NA 138 02/07/2017 0836   K 4.1 11/30/2018 0736   K 4.2 02/07/2017 0836   CL 104 11/30/2018 0736   CO2 21 (L) 11/30/2018 0736   CO2 23 02/07/2017 0836   GLUCOSE 100 (H) 11/30/2018 0736   GLUCOSE 85 02/07/2017 0836   BUN 16 11/30/2018 0736   BUN 15.5 02/07/2017 0836   CREATININE 0.85 11/30/2018 0736   CREATININE 0.9 02/07/2017 0836   CALCIUM 8.9 11/30/2018 0736   CALCIUM 9.5 02/07/2017 0836   PROT 7.0 11/30/2018 0736   PROT 7.0 02/07/2017 0836   ALBUMIN 3.8 11/30/2018 0736   ALBUMIN 3.8 02/07/2017 0836   AST 22 11/30/2018 0736   AST  25 02/07/2017 0836   ALT 23 11/30/2018 0736   ALT 36 02/07/2017 0836   ALKPHOS 52 11/30/2018 0736   ALKPHOS 55 02/07/2017 0836   BILITOT 0.3 11/30/2018 0736   BILITOT 0.31 02/07/2017 0836   GFRNONAA >60 11/30/2018 0736   GFRAA >60 11/30/2018 0736    No results found for: SPEP, UPEP  Lab Results  Component Value Date   WBC 6.4 11/30/2018   NEUTROABS 3.5 11/30/2018   HGB 13.3 11/30/2018   HCT 39.1 11/30/2018   MCV 94.0 11/30/2018   PLT 254 11/30/2018      Chemistry      Component Value Date/Time   NA 138 11/30/2018 0736   NA 138 02/07/2017 0836   K 4.1 11/30/2018 0736   K 4.2 02/07/2017 0836   CL 104 11/30/2018 0736   CO2 21 (L) 11/30/2018 0736   CO2 23 02/07/2017 0836   BUN 16 11/30/2018 0736   BUN 15.5 02/07/2017 0836   CREATININE 0.85 11/30/2018 0736   CREATININE 0.9 02/07/2017 0836      Component Value Date/Time   CALCIUM 8.9 11/30/2018 0736   CALCIUM 9.5 02/07/2017 0836   ALKPHOS 52 11/30/2018 0736   ALKPHOS 55 02/07/2017 0836   AST 22 11/30/2018 0736   AST 25 02/07/2017 0836   ALT 23 11/30/2018  0736   ALT 36 02/07/2017 0836   BILITOT 0.3 11/30/2018 0736   BILITOT 0.31 02/07/2017 0836      All questions were answered. The patient knows to call the clinic with any problems, questions or concerns. No barriers to learning was detected.  I spent 15 minutes counseling the patient face to face. The total time spent in the appointment was 20 minutes and more than 50% was on counseling and review of test results  Heath Lark, MD 12/06/2018 9:26 AM

## 2018-12-06 NOTE — Telephone Encounter (Signed)
Oral Oncology Patient Advocate Encounter  I received a message that the patient is having trouble with her Sprycel copay assistance.   I called both numbers for the patient and was able to leave a message on the home number.  Will continue to update  [place] Patient [place] Phone [phone] Fax [phone] 12/06/2018   1:22 PM

## 2018-12-06 NOTE — Assessment & Plan Note (Addendum)
The patient has achieved major molecular response without detectable BCR/ABL transcripts She is comfortable to continue on reduced dose Sprycel 50 mg daily She tolerated treatment very well with no major side effects She will continue the same treatment without dose adjustment.   I will continue to see her every 3 months with blood work to be done ahead of time I will get our oral chemotherapy navigators to help with payment assistance

## 2018-12-06 NOTE — Assessment & Plan Note (Signed)
She had recent flare of joint pain I do not believe this is due to side effects of Sprycel I recommend trial of vitamin D supplement

## 2018-12-07 ENCOUNTER — Telehealth: Payer: Self-pay | Admitting: Hematology and Oncology

## 2018-12-07 NOTE — Telephone Encounter (Signed)
Tried to reach regarding schedule °

## 2018-12-07 NOTE — Telephone Encounter (Signed)
Oral Oncology Patient Advocate Encounter  The patient called me back while I was away from my desk.  I called the patient back and had to leave a voicemail.  [name] Patient [place] Phone [phone] Fax [phone] 12/07/2018   2:29 PM

## 2018-12-07 NOTE — Telephone Encounter (Signed)
Oral Oncology Patient Advocate Encounter  The patient called me back, she could not remember why she thought that her sprycel copay card had expired. She said that someone from the pharmacy told her that her copay card would expire. There is a copay card on file for her Synthroid as well. I called [facility] to see if they had any information to share about the copay card expiring, they had no idea.  I tried to bill the Sprycel with her copay card to see what the reject was if there was one and it is to soon to fill until 12/15/18.  The patient stated she would call me back if she had trouble with the copay card when her Sprycel was refilled.  [place] Patient [place] Phone [phone] Fax [phone] 12/07/2018   3:28 PM

## 2018-12-10 ENCOUNTER — Other Ambulatory Visit: Payer: Self-pay | Admitting: Internal Medicine

## 2018-12-10 MED ORDER — ZOLPIDEM TARTRATE 10 MG PO TABS: 10.0000 mg | ORAL_TABLET | Freq: Every evening | ORAL | 0 refills | Status: DC | PRN

## 2018-12-10 MED FILL — ZOLPIDEM TARTRATE 10 MG TAB: 10 | 90 days supply | Qty: 90 | Fill #0

## 2018-12-10 NOTE — Progress Notes (Signed)
Refill requested to [facility] as [facility] was [facility]  And all refills to be at  [facility] at this time

## 2018-12-11 LAB — BCR/ABL

## 2019-01-01 MED FILL — SPRYCEL 50 MG TABLET: 50 | 30 days supply | Qty: 30 | Fill #6

## 2019-01-08 MED FILL — LOSARTAN POTASSIUM 50 MG TA: 50 | 90 days supply | Qty: 90 | Fill #1

## 2019-01-08 MED FILL — VERAPAMIL ER 240 MG TABLET: 240 | 90 days supply | Qty: 90 | Fill #1

## 2019-01-10 ENCOUNTER — Telehealth: Payer: Self-pay | Admitting: Hematology and Oncology

## 2019-01-10 NOTE — Telephone Encounter (Signed)
Returned patient's phone call regarding rescheduling an appointment, left a voicemail. 

## 2019-01-16 DIAGNOSIS — M180 Bilateral primary osteoarthritis of first carpometacarpal joints: Secondary | ICD-10-CM | POA: Diagnosis not present

## 2019-01-28 MED FILL — SPRYCEL 50 MG TABLET: 50 | 30 days supply | Qty: 30 | Fill #7

## 2019-02-25 MED FILL — SPRYCEL 50 MG TABLET: 50 | 30 days supply | Qty: 30 | Fill #8

## 2019-02-25 MED FILL — SYNTHROID 150 MCG TABLET: 150 | 90 days supply | Qty: 90 | Fill #1

## 2019-02-27 ENCOUNTER — Other Ambulatory Visit: Payer: Self-pay | Admitting: Hematology and Oncology

## 2019-02-27 DIAGNOSIS — C9210 Chronic myeloid leukemia, BCR/ABL-positive, not having achieved remission: Secondary | ICD-10-CM

## 2019-02-28 ENCOUNTER — Other Ambulatory Visit: Payer: Self-pay

## 2019-02-28 ENCOUNTER — Inpatient Hospital Stay: Payer: Managed Care, Other (non HMO) | Attending: Hematology and Oncology

## 2019-02-28 ENCOUNTER — Other Ambulatory Visit: Payer: 59

## 2019-02-28 DIAGNOSIS — Z79899 Other long term (current) drug therapy: Secondary | ICD-10-CM | POA: Insufficient documentation

## 2019-02-28 DIAGNOSIS — Z8041 Family history of malignant neoplasm of ovary: Secondary | ICD-10-CM | POA: Diagnosis not present

## 2019-02-28 DIAGNOSIS — Z9221 Personal history of antineoplastic chemotherapy: Secondary | ICD-10-CM | POA: Insufficient documentation

## 2019-02-28 DIAGNOSIS — M25549 Pain in joints of unspecified hand: Secondary | ICD-10-CM | POA: Insufficient documentation

## 2019-02-28 DIAGNOSIS — C9210 Chronic myeloid leukemia, BCR/ABL-positive, not having achieved remission: Secondary | ICD-10-CM | POA: Insufficient documentation

## 2019-02-28 LAB — CBC WITH DIFFERENTIAL/PLATELET
Abs Immature Granulocytes: 0.02 10*3/uL (ref 0.00–0.07)
Basophils Absolute: 0.1 10*3/uL (ref 0.0–0.1)
Basophils Relative: 1 %
Eosinophils Absolute: 0.4 10*3/uL (ref 0.0–0.5)
Eosinophils Relative: 7 %
HCT: 37.7 % (ref 36.0–46.0)
Hemoglobin: 13 g/dL (ref 12.0–15.0)
Immature Granulocytes: 0 %
Lymphocytes Relative: 32 %
Lymphs Abs: 2 10*3/uL (ref 0.7–4.0)
MCH: 32 pg (ref 26.0–34.0)
MCHC: 34.5 g/dL (ref 30.0–36.0)
MCV: 92.9 fL (ref 80.0–100.0)
Monocytes Absolute: 0.5 10*3/uL (ref 0.1–1.0)
Monocytes Relative: 9 %
Neutro Abs: 3.2 10*3/uL (ref 1.7–7.7)
Neutrophils Relative %: 51 %
Platelets: 254 10*3/uL (ref 150–400)
RBC: 4.06 MIL/uL (ref 3.87–5.11)
RDW: 12.7 % (ref 11.5–15.5)
WBC: 6.2 10*3/uL (ref 4.0–10.5)
nRBC: 0 % (ref 0.0–0.2)

## 2019-02-28 LAB — COMPREHENSIVE METABOLIC PANEL
ALT: 21 U/L (ref 0–44)
AST: 17 U/L (ref 15–41)
Albumin: 3.8 g/dL (ref 3.5–5.0)
Alkaline Phosphatase: 51 U/L (ref 38–126)
Anion gap: 10 (ref 5–15)
BUN: 16 mg/dL (ref 6–20)
CO2: 22 mmol/L (ref 22–32)
Calcium: 9.4 mg/dL (ref 8.9–10.3)
Chloride: 107 mmol/L (ref 98–111)
Creatinine, Ser: 0.89 mg/dL (ref 0.44–1.00)
GFR calc Af Amer: >60 mL/min (ref >60)
GFR calc non Af Amer: >60 mL/min (ref >60)
Glucose, Bld: 105 mg/dL — ABNORMAL HIGH (ref 70–99)
Potassium: 4.2 mmol/L (ref 3.5–5.1)
Sodium: 139 mmol/L (ref 135–145)
Total Bilirubin: 0.3 mg/dL (ref 0.3–1.2)
Total Protein: 6.7 g/dL (ref 6.5–8.1)

## 2019-03-07 ENCOUNTER — Ambulatory Visit: Payer: 59 | Admitting: Hematology and Oncology

## 2019-03-09 ENCOUNTER — Other Ambulatory Visit: Payer: Self-pay | Admitting: Internal Medicine

## 2019-03-11 ENCOUNTER — Telehealth: Payer: Self-pay

## 2019-03-11 MED FILL — ZOLPIDEM TARTRATE 10 MG TAB: 10 | 90 days supply | Qty: 90 | Fill #0

## 2019-03-11 NOTE — Telephone Encounter (Signed)
Last ov:09/27/17 Last refill:12/10/2018

## 2019-03-11 NOTE — Telephone Encounter (Signed)
invalid

## 2019-03-13 ENCOUNTER — Encounter: Payer: Self-pay | Admitting: Hematology and Oncology

## 2019-03-13 ENCOUNTER — Other Ambulatory Visit: Payer: Self-pay

## 2019-03-13 ENCOUNTER — Inpatient Hospital Stay: Payer: Managed Care, Other (non HMO) | Attending: Hematology and Oncology | Admitting: Hematology and Oncology

## 2019-03-13 DIAGNOSIS — C9210 Chronic myeloid leukemia, BCR/ABL-positive, not having achieved remission: Secondary | ICD-10-CM

## 2019-03-13 DIAGNOSIS — Z9221 Personal history of antineoplastic chemotherapy: Secondary | ICD-10-CM | POA: Diagnosis not present

## 2019-03-13 DIAGNOSIS — Z79899 Other long term (current) drug therapy: Secondary | ICD-10-CM | POA: Insufficient documentation

## 2019-03-13 NOTE — Progress Notes (Signed)
[facility] OFFICE PROGRESS NOTE  Patient Care Team: Panosh, Standley Brooking, MD as PCP - Marguerita Merles, MD (Endocrinology) [name], MD (Internal Medicine) Everett Graff, MD as Attending Physician (Obstetrics and Gynecology)  ASSESSMENT & PLAN:  CML (chronic myeloid leukemia) [facility]) The patient has achieved major molecular response without detectable BCR/ABL transcripts She is comfortable to continue on reduced dose Sprycel 50 mg daily She tolerated treatment very well with no major side effects She will continue the same treatment without dose adjustment.   I will continue to see her every 3 months with blood work to be done ahead of time We discussed the importance of annual influenza vaccination.   No orders of the defined types were placed in this encounter.   INTERVAL HISTORY: Please see below for problem oriented charting. She returns for further follow-up She tolerated treatment well Denies recent infection, fever or chills No recent shortness of breath  SUMMARY OF ONCOLOGIC HISTORY: Oncology History  CML (chronic myeloid leukemia) ([place])  05/15/2015 Bone Marrow Biopsy   BM biopsy and FISH confirmed CML [id number]   05/15/2015 - 05/20/2015 Chemotherapy   She started taking Hydrea 1500 mg daily   05/15/2015 Tumor Marker   BCR/ABL b2a2 87.5%, IS 49%   05/21/2015 -  Chemotherapy   She started taking Sprycel 100 mg daily   06/29/2015 Adverse Reaction   She had diarrhea. Dose of Srycel is reduced to 50 mg daily   09/10/2015 Tumor Marker   BCR/ABL b2a2 0.21%, IS 0.1743%   12/11/2015 Pathology Results   BCR/ABL b2a2 0.19%, IS 0.1577%   04/13/2016 Pathology Results   BCR/ABL b2a2 0.29%, IS 0.2407%   07/14/2016 Pathology Results   BCR/ABL b2a2 0.004%, IS 0.0092%. She is in MMR   07/21/2016 Miscellaneous   Dose of Sprycel is reduced to 50 mg daily   09/08/2016 Pathology Results   BCR/ABL b2a2 0.001%, IS 0.0023%. She is in MMR   06/14/2017  Pathology Results   BCR/ABL not detected. She is in MMR   10/11/2017 Pathology Results   BCR/ABL is not detected. She is in MMR   02/14/2018 Pathology Results   BCR/ABL is not detected. She is in MMR   05/17/2018 Pathology Results   BCR/ABL is not detected. She is in MMR   08/22/2018 Pathology Results   BCR/ABL is not detected. She is in MMR   11/30/2018 Pathology Results   BCR/ABL is not detected. She is in MMR     REVIEW OF SYSTEMS:   Constitutional: Denies fevers, chills or abnormal weight loss Eyes: Denies blurriness of vision Ears, nose, mouth, throat, and face: Denies mucositis or sore throat Respiratory: Denies cough, dyspnea or wheezes Cardiovascular: Denies palpitation, chest discomfort or lower extremity swelling Gastrointestinal:  Denies nausea, heartburn or change in bowel habits Skin: Denies abnormal skin rashes Lymphatics: Denies new lymphadenopathy or easy bruising Neurological:Denies numbness, tingling or new weaknesses Behavioral/Psych: Mood is stable, no new changes  All other systems were reviewed with the patient and are negative.  I have reviewed the past medical history, past surgical history, social history and family history with the patient and they are unchanged from previous note.  ALLERGIES:  has No Known Allergies.  MEDICATIONS:  Current Outpatient Medications  Medication Sig Dispense Refill  . losartan (COZAAR) 50 MG tablet TAKE 1 TABLET BY MOUTH DAILY. 90 tablet 1  . SPRYCEL 50 MG tablet TAKE 1 TABLET (50 MG TOTAL) BY MOUTH DAILY. 90 tablet 3  . SYNTHROID 150 MCG  tablet TAKE 1 TABLET (150 MCG TOTAL) BY MOUTH DAILY BEFORE BREAKFAST. 90 tablet 3  . verapamil (CALAN-SR) 240 MG CR tablet Take 1 tablet (240 mg total) by mouth at bedtime. 90 tablet 1  . zolpidem (AMBIEN) 10 MG tablet TAKE 1 TABLET (10 MG TOTAL) BY MOUTH AT BEDTIME AS NEEDED. FOR SLEEP 90 tablet 0   No current facility-administered medications for this visit.     PHYSICAL  EXAMINATION: ECOG PERFORMANCE STATUS: 0 - Asymptomatic  Vitals:   03/13/19 1011  BP: 127/60  Pulse: 78  Resp: 18  Temp: 98.9 F (37.2 C)  SpO2: 96%   Filed Weights   03/13/19 1011  Weight: 185 lb 6.4 oz (84.1 kg)    GENERAL:alert, no distress and comfortable SKIN: skin color, texture, turgor are normal, no rashes or significant lesions EYES: normal, Conjunctiva are pink and non-injected, sclera clear OROPHARYNX:no exudate, no erythema and lips, buccal mucosa, and tongue normal  NECK: supple, thyroid normal size, non-tender, without nodularity LYMPH:  no palpable lymphadenopathy in the cervical, axillary or inguinal LUNGS: clear to auscultation and percussion with normal breathing effort HEART: regular rate & rhythm and no murmurs and no lower extremity edema ABDOMEN:abdomen soft, non-tender and normal bowel sounds Musculoskeletal:no cyanosis of digits and no clubbing  NEURO: alert & oriented x 3 with fluent speech, no focal motor/sensory deficits  LABORATORY DATA:  I have reviewed the data as listed    Component Value Date/Time   NA 139 02/28/2019 0810   NA 138 02/07/2017 0836   K 4.2 02/28/2019 0810   K 4.2 02/07/2017 0836   CL 107 02/28/2019 0810   CO2 22 02/28/2019 0810   CO2 23 02/07/2017 0836   GLUCOSE 105 (H) 02/28/2019 0810   GLUCOSE 85 02/07/2017 0836   BUN 16 02/28/2019 0810   BUN 15.5 02/07/2017 0836   CREATININE 0.89 02/28/2019 0810   CREATININE 0.9 02/07/2017 0836   CALCIUM 9.4 02/28/2019 0810   CALCIUM 9.5 02/07/2017 0836   PROT 6.7 02/28/2019 0810   PROT 7.0 02/07/2017 0836   ALBUMIN 3.8 02/28/2019 0810   ALBUMIN 3.8 02/07/2017 0836   AST 17 02/28/2019 0810   AST 25 02/07/2017 0836   ALT 21 02/28/2019 0810   ALT 36 02/07/2017 0836   ALKPHOS 51 02/28/2019 0810   ALKPHOS 55 02/07/2017 0836   BILITOT 0.3 02/28/2019 0810   BILITOT 0.31 02/07/2017 0836   GFRNONAA >60 02/28/2019 0810   GFRAA >60 02/28/2019 0810    No results found for: SPEP,  UPEP  Lab Results  Component Value Date   WBC 6.2 02/28/2019   NEUTROABS 3.2 02/28/2019   HGB 13.0 02/28/2019   HCT 37.7 02/28/2019   MCV 92.9 02/28/2019   PLT 254 02/28/2019      Chemistry      Component Value Date/Time   NA 139 02/28/2019 0810   NA 138 02/07/2017 0836   K 4.2 02/28/2019 0810   K 4.2 02/07/2017 0836   CL 107 02/28/2019 0810   CO2 22 02/28/2019 0810   CO2 23 02/07/2017 0836   BUN 16 02/28/2019 0810   BUN 15.5 02/07/2017 0836   CREATININE 0.89 02/28/2019 0810   CREATININE 0.9 02/07/2017 0836      Component Value Date/Time   CALCIUM 9.4 02/28/2019 0810   CALCIUM 9.5 02/07/2017 0836   ALKPHOS 51 02/28/2019 0810   ALKPHOS 55 02/07/2017 0836   AST 17 02/28/2019 0810   AST 25 02/07/2017 0836   ALT 21  02/28/2019 0810   ALT 36 02/07/2017 0836   BILITOT 0.3 02/28/2019 0810   BILITOT 0.31 02/07/2017 0836      All questions were answered. The patient knows to call the clinic with any problems, questions or concerns. No barriers to learning was detected.  I spent 10 minutes counseling the patient face to face. The total time spent in the appointment was 15 minutes and more than 50% was on counseling and review of test results  Heath Lark, MD 03/13/2019 10:26 AM

## 2019-03-13 NOTE — Assessment & Plan Note (Addendum)
The patient has achieved major molecular response without detectable BCR/ABL transcripts She is comfortable to continue on reduced dose Sprycel 50 mg daily She tolerated treatment very well with no major side effects She will continue the same treatment without dose adjustment.   I will continue to see her every 3 months with blood work to be done ahead of time We discussed the importance of annual influenza vaccination.

## 2019-03-14 ENCOUNTER — Telehealth: Payer: Self-pay | Admitting: Hematology and Oncology

## 2019-03-14 NOTE — Telephone Encounter (Signed)
I left a message regarding schedule I will mail °

## 2019-03-19 MED FILL — SPRYCEL 50 MG TABLET: 50 | 30 days supply | Qty: 30 | Fill #9

## 2019-03-20 ENCOUNTER — Institutional Professional Consult (permissible substitution): Payer: Managed Care, Other (non HMO) | Admitting: Pulmonary Disease

## 2019-03-27 ENCOUNTER — Encounter: Payer: Self-pay | Admitting: Pulmonary Disease

## 2019-03-27 ENCOUNTER — Other Ambulatory Visit: Payer: Self-pay

## 2019-03-27 ENCOUNTER — Ambulatory Visit (INDEPENDENT_AMBULATORY_CARE_PROVIDER_SITE_OTHER): Payer: Managed Care, Other (non HMO) | Admitting: Pulmonary Disease

## 2019-03-27 VITALS — BP 122/70 | HR 76 | Temp 98.2°F | Ht 68.0 in | Wt 186.0 lb

## 2019-03-27 DIAGNOSIS — R0683 Snoring: Secondary | ICD-10-CM | POA: Diagnosis not present

## 2019-03-27 DIAGNOSIS — G4700 Insomnia, unspecified: Secondary | ICD-10-CM

## 2019-03-27 NOTE — Progress Notes (Signed)
New Patient Office Visit  Subjective:  Patient ID: Christina [name], female    DOB: August 06, 1961  Age: 58 y.o. MRN: [id number]  CC:  Chief Complaint  Patient presents with  . Consult    Reports she has had trouble sleeping for years. She takes Ambien which she does not think is effective. She does reports that she snores.     HPI Christina [name] presents for evaluation of sleep difficulty. She has as PMH notable for HTN, hypothyroidism, insomnia and leukemia. She stated that recently a friend of hers was diagnosed with sleep apnea and their symptoms were very similar. Her primary concern is with the awakening after approximately 2 hours of sleep. With regard to sleep hygiene she denied stimulant use aside from 1.5 cups of coffee in the morning. She stated that she drinks 1/2 glass of red wine. She endorses prominent snoring but has as a history of cleft palate s/p surgical repair. She denied a known history of apnea or waking short of breath or gasping for air. She dose not exercise before bed nor dose she use electronic devices in bed. She does endorse watching a TV show with her husband around 8pm prior to lying in bed at 9pm where she will read a book for ~2 hours prior to drifting off to sleep around 11. She them wakes around 130am for not apparent reason and remains awake for ~45min until she has wandered the house. Absent her Ambien, she is unable to fall asleep or sleep for any appreciable period of time.   Past Medical History:  Diagnosis Date  . Allergic rhinitis   . Cleft palate   . Fibroid   . Grave's disease   . H/O insomnia   . H/O menorrhagia 2012  . History of chicken pox   . Hypertension   . Hyperthyroidism    s/p rai 8/06 now hypothyroid Dr. Suzette Battiest  . Infertility, female   . Leukemia ([place])   . Menorrhagia    Had Novasure  . 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 Polyp of cervix    Endometriel polyp. Denice [facility] Vitamin D deficiency     Past Surgical History:   Procedure Laterality Date  . BUNIONECTOMY  1986  . CRANIOTOMY N/A 06/17/2016   Procedure: RIGHT CRANIOTOMY HEMATOMA EVACUATION SUBDURAL;  Surgeon: Eustace Moore, MD;  Location: [facility];  Service: Neurosurgery;  Laterality: N/A;  . CRANIOTOMY Right 06/18/2016   Procedure: CRANIOTOMY HEMATOMA EVACUATION SUBDURAL RIGHT;  Surgeon: Eustace Moore, MD;  Location: [facility];  Service: Neurosurgery;  Laterality: Right;  . DILATION AND CURETTAGE OF UTERUS  09/15/2010  . HYSTEROSCOPY W/D&C  09/28/2010  . MOUTH SURGERY    . NOVASURE ABLATION  09/28/2010  . REFRACTIVE SURGERY     for vision  . TONSILLECTOMY  1965    Family History  Problem Relation Age of Onset  . Diabetes Mother   . Cancer Mother 30       ovarian ca  . Hypertension Other   . Prostate cancer Other        grandfather  . Hypertension Father   . Cancer Maternal Uncle        leukemia    Social History   Socioeconomic History  . Marital status: Married    Spouse name: Not on file  . Number of children: Not on file  . Years of education: Not on file  . Highest education level: Not on file  Occupational History  .  Not on file  Social Needs  . Financial resource strain: Not on file  . Food insecurity    Worry: Not on file    Inability: Not on file  . Transportation needs    Medical: Not on file    Non-medical: Not on file  Tobacco Use  . Smoking status: Never Smoker  . Smokeless tobacco: Never Used  Substance and Sexual Activity  . Alcohol use: Yes    Comment: socially  . Drug use: No  . Sexual activity: Yes  Lifestyle  . Physical activity    Days per week: Not on file    Minutes per session: Not on file  . Stress: Not on file  Relationships  . Social Herbalist on phone: Not on file    Gets together: Not on file    Attends religious service: Not on file    Active member of club or organization: Not on file    Attends meetings of clubs or organizations: Not on file    Relationship status: Not on file  .  Intimate partner violence    Fear of current or ex partner: Not on file    Emotionally abused: Not on file    Physically abused: Not on file    Forced sexual activity: Not on file  Other Topics Concern  . Not on file  Social History Narrative   Married   HH of 4   Audiologist working at [employer]  Constitutional: Negative for activity change, appetite change, fever and unexpected weight change.  HENT: Positive for hearing loss. Negative for congestion, facial swelling, nosebleeds, sinus pressure, sinus pain, sore throat and trouble swallowing.   Eyes: Negative for pain and discharge.  Respiratory: Negative for apnea, cough, choking and shortness of breath.   Cardiovascular: Negative for chest pain and leg swelling.  Gastrointestinal: Negative for abdominal distention and abdominal pain.  Endocrine: Negative for polyuria.  Genitourinary: Negative for dysuria.  Musculoskeletal: Negative for arthralgias and back pain.  Skin: Negative for color change and rash.  Neurological: Negative for dizziness, facial asymmetry and weakness.  Psychiatric/Behavioral: Negative for agitation and confusion. The patient is not nervous/anxious.     Objective:   Today's Vitals: BP 122/70   Pulse 76   Temp 98.2 F (36.8 C) (Temporal)   Ht 5\' 8"  (1.727 m)   Wt 186 lb (84.4 kg)   SpO2 96%   BMI 28.28 kg/m   Physical Exam Constitutional:      General: She is not in acute distress.    Appearance: She is well-developed. She is not diaphoretic.  HENT:     Head: Normocephalic and atraumatic.     Mouth/Throat:     Comments: Mallampati score was 3-4 Eyes:     Conjunctiva/sclera: Conjunctivae normal.  Neck:     Musculoskeletal: Normal range of motion and neck supple.  Cardiovascular:     Rate and Rhythm: Normal rate and regular rhythm.     Heart sounds: No murmur.  Pulmonary:     Effort: Pulmonary effort is normal. No respiratory distress.     Breath  sounds: Normal breath sounds. No stridor.  Abdominal:     General: Bowel sounds are normal. There is no distension.     Palpations: Abdomen is soft.     Tenderness: There is no abdominal tenderness.  Musculoskeletal:        General: No tenderness.  Skin:  General: Skin is warm.     Capillary Refill: Capillary refill takes less than 2 seconds.  Neurological:     Mental Status: She is alert and oriented to person, place, and time.     Assessment & Plan:   Problem List Items Addressed This Visit      Other   INSOMNIA    Given the patients sleep cycle with awakening ~1-2 hours after sleep onset, her snoring, the HTN, hypothyroidism, mallampati score, history of oral surgery, and excessive sleepiness on weekends she is at risk for OSA and warrants further evaluation with a sleep study.  Will recommend home sleep study for initial evaluation. In addition, I have advised against reading in bed and cessation of EtOH use prior to bed as this later is also known to exacerbate insomnia. The remainder of her sleep hygiene appears somewhat appropriate and is not in need major alteration.       Snoring - Primary    Snoring. Ongoing issue, possibly related to prior oral surgery vs airway obstruction from OSA. No obvious signs of nasal congestion or edema on exam. Mallampati 3-4. I feel she is unlikely to benefit from ENT referral at this time but do agree that a sleep study is warranted based on the argument under the insomnia A/P from today. There was no TMJ on evaluation. Can consider both oral piece vs CPAP.       Relevant Orders   Home sleep test      Outpatient Encounter Medications as of 03/27/2019  Medication Sig  . losartan (COZAAR) 50 MG tablet TAKE 1 TABLET BY MOUTH DAILY.  Marland Kitchen SPRYCEL 50 MG tablet TAKE 1 TABLET (50 MG TOTAL) BY MOUTH DAILY.  . SYNTHROID 150 MCG tablet TAKE 1 TABLET (150 MCG TOTAL) BY MOUTH DAILY BEFORE BREAKFAST.  Marland Kitchen verapamil (CALAN-SR) 240 MG CR tablet Take 1  tablet (240 mg total) by mouth at bedtime.  Marland Kitchen zolpidem (AMBIEN) 10 MG tablet TAKE 1 TABLET (10 MG TOTAL) BY MOUTH AT BEDTIME AS NEEDED. FOR SLEEP   No facility-administered encounter medications on file as of 03/27/2019.     Follow-up: No follow-ups on file.   Kathi Ludwig, MD

## 2019-03-27 NOTE — Patient Instructions (Signed)
Will arrange for home sleep study Will call to arrange for follow up after sleep study reviewed  

## 2019-03-27 NOTE — Assessment & Plan Note (Addendum)
Given the patients sleep cycle with awakening ~1-2 hours after sleep onset, her snoring, the HTN, hypothyroidism, mallampati score, history of oral surgery, and excessive sleepiness on weekends she is at risk for OSA and warrants further evaluation with a sleep study. Epworth sleepiness score of 5 was obtained.  Will recommend home sleep study for initial evaluation. In addition, I have advised against reading in bed and cessation of EtOH use prior to bed as this later is also known to exacerbate insomnia. The remainder of her sleep hygiene appears somewhat appropriate and is not in need major alteration.

## 2019-03-27 NOTE — Progress Notes (Signed)
[facility]  Chief Complaint  Patient presents with  . Consult    Reports she has had trouble sleeping for years. She takes Ambien which she does not think is effective. She does reports that she snores.     Constitutional:  BP 122/70   Pulse 76   Temp 98.2 F (36.8 C) (Temporal)   Ht 5\' 8"  (1.727 m)   Wt 186 lb (84.4 kg)   SpO2 96%   BMI 28.28 kg/m   Past Medical History:  Vit D deficiency, Grave's disease, Hypothyroidism, HTN, Fibroid, Cleft palate s/p repair, SDH s/p craniotomy, Allergies, Chronic myeloid leukemia  Brief Summary:  Christina [name] is a 58 y.o. female with snoring.  She has noticed trouble with her sleep for a while.  Her husband is a Engineer, drilling.  He has noticed snoring at night.  She never dreams.  She has been on medication for insomnia.  She can fall asleep with medication, but wakes up after about 2 hours.  She isn't sure why.  She drinks 1 to 2 cups of coffee in the morning.  She can fall asleep while watching TV.  Her Epworth score in 5 out of 24.  She denies sleep walking, sleep talking, bruxism, or nightmares.  There is no history of restless legs.  She denies sleep hallucinations, sleep paralysis, or cataplexy.   Physical Exam:   Appearance - well kempt   ENMT - clear nasal mucosa, midline nasal  septum, no oral exudates, no LAN, trachea midline  Respiratory - normal chest wall, normal respiratory effort, no accessory muscle use, no wheeze/rales  CV - s1s2 regular rate and rhythm, no murmurs, no peripheral edema, radial pulses symmetric  GI - soft, non tender, no masses  Lymph - no adenopathy noted in neck and axillary areas  MSK - normal gait  Ext - no cyanosis, clubbing, or joint inflammation noted  Skin - no rashes, lesions, or ulcers  Neuro - normal strength, oriented x 3  Psych - normal mood and affect   Assessment/Plan:   Snoring with excessive daytime sleepiness. - will need to arrange  for a home sleep study  Obesity. - discussed how weight can impact sleep and risk for sleep disordered breathing - discussed options to assist with weight loss: combination of diet modification, cardiovascular and strength training exercises  Cardiovascular risk. - had an extensive discussion regarding the adverse health consequences related to untreated sleep disordered breathing - specifically discussed the risks for hypertension, coronary artery disease, cardiac dysrhythmias, cerebrovascular disease, and diabetes - lifestyle modification discussed  Safe driving practices. - discussed how sleep disruption can increase risk of accidents, particularly when driving - safe driving practices were discussed  Therapies for obstructive sleep apnea. - if the sleep study shows significant sleep apnea, then various therapies for treatment were reviewed: CPAP, oral appliance, and surgical interventions  Sleep onset and sleep maintenance insomnia. 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  Remainder reviewed and negative.  Medications:   Allergies as of 03/27/2019   No Known Allergies     Medication List       Accurate as of March 27, 2019 11:43 AM. If you have any questions, ask your nurse or doctor.        losartan 50 MG tablet Commonly known as: COZAAR TAKE 1 TABLET BY MOUTH DAILY.   Sprycel 50 MG tablet Generic drug: dasatinib TAKE 1 TABLET (50 MG TOTAL) BY MOUTH DAILY.    Synthroid 150 MCG tablet Generic drug: levothyroxine TAKE 1 TABLET (150 MCG TOTAL) BY MOUTH DAILY BEFORE BREAKFAST.   verapamil 240 MG CR tablet Commonly known as: CALAN-SR Take 1 tablet (240 mg total) by mouth at bedtime.   zolpidem 10 MG tablet Commonly known as: AMBIEN TAKE 1 TABLET (10 MG TOTAL) BY MOUTH AT BEDTIME AS NEEDED. FOR SLEEP       Past Surgical History:  She  has a past surgical history that includes Mouth surgery; Bunionectomy (1986); Refractive surgery; Novasure ablation (09/28/2010); Hysteroscopy w/D&C (09/28/2010); Dilation and curettage of uterus (09/15/2010); Tonsillectomy (1965); Craniotomy (N/A, 06/17/2016); and Craniotomy (Right, 06/18/2016).  Family History:  Her family history includes Cancer in her maternal uncle; Cancer (age of onset: 31) in her mother; Diabetes in her mother; Hypertension in her father and another family member; Prostate cancer in an other family member.  Social History:  She  reports that she has never smoked. She has never used smokeless tobacco. She reports current alcohol use. She reports that she does not use drugs.

## 2019-03-27 NOTE — Assessment & Plan Note (Signed)
Snoring. Ongoing issue, possibly related to prior oral surgery vs airway obstruction from OSA. No obvious signs of nasal congestion or edema on exam. Mallampati 3-4. I feel she is unlikely to benefit from ENT referral at this time but do agree that a sleep study is warranted based on the argument under the insomnia A/P from today. There was no TMJ on evaluation. Can consider both oral piece vs CPAP.

## 2019-04-01 ENCOUNTER — Telehealth: Payer: Self-pay | Admitting: Hematology and Oncology

## 2019-04-01 NOTE — Telephone Encounter (Signed)
Returned patient call to patient re rescheduling appointments. Confirmed with patient new time for 10/26 and 11/5 appointments.

## 2019-04-08 ENCOUNTER — Other Ambulatory Visit: Payer: Self-pay | Admitting: Family Medicine

## 2019-04-08 ENCOUNTER — Other Ambulatory Visit: Payer: Self-pay | Admitting: Internal Medicine

## 2019-04-09 MED FILL — LOSARTAN POTASSIUM 50 MG TA: 50 | 90 days supply | Qty: 90 | Fill #0

## 2019-04-10 MED FILL — VERAPAMIL ER 240 MG TABLET: 240 | 90 days supply | Qty: 90 | Fill #0

## 2019-04-12 ENCOUNTER — Encounter: Payer: Self-pay | Admitting: Gastroenterology

## 2019-04-16 NOTE — Progress Notes (Addendum)
Chief Complaint  Patient presents with  . Annual Exam    No concerns today  . Hypertension  . Hypothyroidism  . Medication Management    HPI: Patient  Christina [name]  58 y.o. comes in today for Preventive Health Care visit  And meds : Thyroid   Good compliance hasn't seen dr Chalmers Cater for a while  undereval for   Snoring  osa  Intervention try to get off the ambien   CML in remission stable  BP no problem taking meds  No sure about pap hasn't seen gyne recently Working FT in [employer] for colon  delayed on screening  Mom has been dx ovaraianc cancer  Had hip fracture and memory losses  Health Maintenance  Topic Date Due  . Hepatitis C Screening  06-Mar-1961  . HIV Screening  07/06/1975  . TETANUS/TDAP  07/06/1979  . PAP SMEAR-Modifier  09/11/2016  . INFLUENZA VACCINE  03/09/2019  . COLONOSCOPY  04/24/2019 (Originally 07/05/2010)  . MAMMOGRAM  05/11/2019   Health Maintenance Review LIFESTYLE:  Exercise:   Tobacco/ETS: no  Alcohol: s Sugar beverages:n Sleep:working on ti  Drug use: no HH of 2-3 Work:  40 hours    ROS:   Right hand pain poss arthritis  Takes aleve ocassionally GEN/ HEENT: No fever, significant weight changes sweats headaches vision problems hearing changes, CV/ PULM; No chest pain shortness of breath cough, syncope,edema  change in exercise tolerance. GI /GU: No adominal pain, vomiting, change in bowel habits. No blood in the stool. No significant GU symptoms. SKIN/HEME: ,no acute skin rashes suspicious lesions or bleeding. No lymphadenopathy, nodules, masses.  NEURO/ PSYCH:  No neurologic signs such as weakness numbness. No depression anxiety. IMM/ Allergy: No unusual infections.  Allergy .   REST of 12 system review negative except as per HPI   Past Medical History:  Diagnosis Date  . Allergic rhinitis   . Cleft palate   . Fibroid   . Grave's disease   . H/O insomnia   . H/O menorrhagia 2012  . History of chicken pox   . Hypertension   .  Hyperthyroidism    s/p rai 8/06 now hypothyroid Dr. Suzette Battiest  . Infertility, female   . Leukemia ([place])   . Menorrhagia    Had Novasure  . 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 Polyp of cervix    Endometriel polyp. Denice Paradise Vitamin D deficiency     Past Surgical History:  Procedure Laterality Date  . BUNIONECTOMY  1986  . CRANIOTOMY N/A 06/17/2016   Procedure: RIGHT CRANIOTOMY HEMATOMA EVACUATION SUBDURAL;  Surgeon: Eustace Moore, MD;  Location: [facility];  Service: Neurosurgery;  Laterality: N/A;  . CRANIOTOMY Right 06/18/2016   Procedure: CRANIOTOMY HEMATOMA EVACUATION SUBDURAL RIGHT;  Surgeon: Eustace Moore, MD;  Location: [facility];  Service: Neurosurgery;  Laterality: Right;  . DILATION AND CURETTAGE OF UTERUS  09/15/2010  . HYSTEROSCOPY W/D&C  09/28/2010  . MOUTH SURGERY    . NOVASURE ABLATION  09/28/2010  . REFRACTIVE SURGERY     for vision  . TONSILLECTOMY  1965    Family History  Problem Relation Age of Onset  . Diabetes Mother   . Cancer Mother 55       ovarian ca  . Hypertension Other   . Prostate cancer Other        grandfather  . Hypertension Father   . Cancer Maternal Uncle        leukemia  Social History   Socioeconomic History  . Marital status: Married    Spouse name: Not on file  . Number of children: Not on file  . Years of education: Not on file  . Highest education level: Not on file  Occupational History  . Not on file  Social Needs  . Financial resource strain: Not on file  . Food insecurity    Worry: Not on file    Inability: Not on file  . Transportation needs    Medical: Not on file    Non-medical: Not on file  Tobacco Use  . Smoking status: Never Smoker  . Smokeless tobacco: Never Used  Substance and Sexual Activity  . Alcohol use: Yes    Comment: socially  . Drug use: No  . Sexual activity: Yes  Lifestyle  . Physical activity    Days per week: Not on file    Minutes per session: Not on file  . Stress: Not on file   Relationships  . Social Herbalist on phone: Not on file    Gets together: Not on file    Attends religious service: Not on file    Active member of club or organization: Not on file    Attends meetings of clubs or organizations: Not on file    Relationship status: Not on file  Other Topics Concern  . Not on file  Social History Narrative   Married   HH of 4   Audiologist working at [employer]    Outpatient Medications Prior to Visit  Medication Sig Dispense Refill  . losartan (COZAAR) 50 MG tablet TAKE 1 TABLET BY MOUTH DAILY. 90 tablet 1  . SPRYCEL 50 MG tablet TAKE 1 TABLET (50 MG TOTAL) BY MOUTH DAILY. 90 tablet 3  . SYNTHROID 150 MCG tablet TAKE 1 TABLET (150 MCG TOTAL) BY MOUTH DAILY BEFORE BREAKFAST. 90 tablet 3  . verapamil (CALAN-SR) 240 MG CR tablet TAKE 1 TABLET (240 MG TOTAL) BY MOUTH AT BEDTIME. 90 tablet 1  . zolpidem (AMBIEN) 10 MG tablet TAKE 1 TABLET (10 MG TOTAL) BY MOUTH AT BEDTIME AS NEEDED. FOR SLEEP 90 tablet 0   No facility-administered medications prior to visit.      EXAM:  BP 128/72 (BP Location: Right Arm, Patient Position: Sitting, Cuff Size: Normal)   Pulse 84   Temp 98.7 F (37.1 C) (Temporal)   Ht 5\' 7"  (1.702 m)   Wt 187 lb 12.8 oz (85.2 kg)   SpO2 97%   BMI 29.41 kg/m   Body mass index is 29.41 kg/m. Wt Readings from Last 3 Encounters:  04/17/19 187 lb 12.8 oz (85.2 kg)  03/27/19 186 lb (84.4 kg)  03/13/19 185 lb 6.4 oz (84.1 kg)    Physical Exam: Vital signs reviewed [id number] is a well-developed well-nourished alert cooperative    who appearsr stated age in no acute distress.  HEENT: normocephalic atraumatic , Eyes: PERRL EOM's full, conjunctiva clear, .,masked  Deferred  NECK: supple without masses, thyromegaly or bruits. CHEST/PULM:  Clear to auscultation and percussion breath sounds equal no wheeze , rales or rhonchi. No chest wall deformities or tenderness. Breast: normal by inspection . No dimpling,  discharge, masses, tenderness or discharge . CV: PMI is nondisplaced, S1 S2 no gallops, murmurs, rubs. Peripheral pulses are full without delay.No JVD .  ABDOMEN: Bowel sounds normal nontender  No guard or rebound, no hepato splenomegal no CVA tenderness.   Extremtities:  No clubbing cyanosis or edema, no acute joint swelling or redness no focal atrophy right hand mild changes  NEURO:  Oriented x3, cranial nerves 3-12 appear to be intact, no obvious focal weakness,gait within normal limits no abnormal reflexes or asymmetrical SKIN: No acute rashes normal turgor, color, no bruising or petechiae. Dry skin  No obv lesions  PSYCH: Oriented, good eye contact, no obvious depression anxiety, cognition and judgment appear normal. LN: no cervical axillary inguinal adenopathy  Lab Results  Component Value Date   WBC 6.2 02/28/2019   HGB 13.0 02/28/2019   HCT 37.7 02/28/2019   PLT 254 02/28/2019   GLUCOSE 105 (H) 02/28/2019   CHOL 221 (H) 10/27/2017   TRIG 143.0 10/27/2017   HDL 62.90 10/27/2017   LDLDIRECT 129.0 09/27/2017   LDLCALC 129 (H) 10/27/2017   ALT 21 02/28/2019   AST 17 02/28/2019   NA 139 02/28/2019   K 4.2 02/28/2019   CL 107 02/28/2019   CREATININE 0.89 02/28/2019   BUN 16 02/28/2019   CO2 22 02/28/2019   TSH 2.54 09/27/2017   INR 0.90 06/17/2016   HGBA1C 5.5 10/27/2017    BP Readings from Last 3 Encounters:  04/17/19 128/72  03/27/19 122/70  03/13/19 127/60    Lab results reviewed with patient    Need fasting lipids with hx of high tg  And alos tsh  Due   ASSESSMENT AND PLAN:  Discussed the following assessment and plan:    ICD-10-CM   1. Visit for preventive health examination  Z00.00   2. Medication management  Z79.899 Lipid panel    TSH    T4, free    Hemoglobin A1c  3. Hypothyroidism, unspecified type  E03.9 Lipid panel    TSH    T4, free    Hemoglobin A1c  4. Hypertriglyceridemia  E78.1 Lipid panel    TSH    T4, free    Hemoglobin A1c  5. Essential  hypertension  I10   6. Insomnia, unspecified type  G47.00   7. Need for influenza vaccination  Z23 Flu Vaccine QUAD 6+ mos PF IM (Fluarix Quad PF)  8. Need for Tdap vaccination  Z23 Tdap vaccine greater than or equal to 7yo IM   Plan fasting labs lipid a1c and tsh   Flu and tdap today She is working on   Sleep . No change in meds at  This time. Get her colon mammogram and pap   We can do her tsh  And med  Fu as appropriate  dsic using topicals for joint pain  Limiting nsaids as possible Patient Care Team: Panosh, Standley Brooking, MD as PCP - General [name], MD (Endocrinology) [name], MD (Internal Medicine) Everett Graff, MD as Attending Physician (Obstetrics and Gynecology) Heath Lark, MD as Consulting Physician (Hematology and Oncology) Chesley Mires, MD as Consulting Physician (Pulmonary Disease) Patient Instructions  Due for tdap .    Come back for pap .   And fasting    Labs to include lipids and  tsh and hg a1c    Get your mammogram .     Health Maintenance, Female Adopting a healthy lifestyle and getting preventive care are important in promoting health and wellness. Ask your health care provider about:  The right schedule for you to have regular tests and exams.  Things you can do on your own to prevent diseases and keep yourself healthy. What should I know about diet, weight, and exercise? Eat a healthy diet  Eat a diet that includes plenty of vegetables, fruits, low-fat dairy products, and lean protein.  Do not eat a lot of foods that are high in solid fats, added sugars, or sodium. Maintain a healthy weight Body mass index (BMI) is used to identify weight problems. It estimates body fat based on height and weight. Your health care provider can help determine your BMI and help you achieve or maintain a healthy weight. Get regular exercise Get regular exercise. This is one of the most important things you can do for your health. Most adults should:   Exercise for at least 150 minutes each week. The exercise should increase your heart rate and make you sweat (moderate-intensity exercise).  Do strengthening exercises at least twice a week. This is in addition to the moderate-intensity exercise.  Spend less time sitting. Even light physical activity can be beneficial. Watch cholesterol and blood lipids Have your blood tested for lipids and cholesterol at 59 years of age, then have this test every 5 years. Have your cholesterol levels checked more often if:  Your lipid or cholesterol levels are high.  You are older than 59 years of age.  You are at high risk for heart disease. What should I know about cancer screening? Depending on your health history and family history, you may need to have cancer screening at various ages. This may include screening for:  Breast cancer.  Cervical cancer.  Colorectal cancer.  Skin cancer.  Lung cancer. What should I know about heart disease, diabetes, and high blood pressure? Blood pressure and heart disease  High blood pressure causes heart disease and increases the risk of stroke. This is more likely to develop in people who have high blood pressure readings, are of African descent, or are overweight.  Have your blood pressure checked: ? Every 3-5 years if you are 68-58 years of age. ? Every year if you are 61 years old or older. Diabetes Have regular diabetes screenings. This checks your fasting blood sugar level. Have the screening done:  Once every three years after age 14 if you are at a normal weight and have a low risk for diabetes.  More often and at a younger age if you are overweight or have a high risk for diabetes. What should I know about preventing infection? Hepatitis B If you have a higher risk for hepatitis B, you should be screened for this virus. Talk with your health care provider to find out if you are at risk for hepatitis B infection. Hepatitis C Testing is  recommended for:  Everyone born from [birth year] through 1966.  Anyone with known risk factors for hepatitis C. Sexually transmitted infections (STIs)  Get screened for STIs, including gonorrhea and chlamydia, if: ? You are sexually active and are younger than 59 years of age. ? You are older than 59 years of age and your health care provider tells you that you are at risk for this type of infection. ? Your sexual activity has changed since you were last screened, and you are at increased risk for chlamydia or gonorrhea. Ask your health care provider if you are at risk.  Ask your health care provider about whether you are at high risk for HIV. Your health care provider may recommend a prescription medicine to help prevent HIV infection. If you choose to take medicine to prevent HIV, you should first get tested for HIV. You should then be tested every 3 months for as long as you are taking the  medicine. Pregnancy  If you are about to stop having your period (premenopausal) and you may become pregnant, seek counseling before you get pregnant.  Take 400 to 800 micrograms (mcg) of folic acid every day if you become pregnant.  Ask for birth control (contraception) if you want to prevent pregnancy. Osteoporosis and menopause Osteoporosis is a disease in which the bones lose minerals and strength with aging. This can result in bone fractures. If you are 57 years old or older, or if you are at risk for osteoporosis and fractures, ask your health care provider if you should:  Be screened for bone loss.  Take a calcium or vitamin D supplement to lower your risk of fractures.  Be given hormone replacement therapy (HRT) to treat symptoms of menopause. Follow these instructions at home: Lifestyle  Do not use any products that contain nicotine or tobacco, such as cigarettes, e-cigarettes, and chewing tobacco. If you need help quitting, ask your health care provider.  Do not use street drugs.  Do not  share needles.  Ask your health care provider for help if you need support or information about quitting drugs. Alcohol use  Do not drink alcohol if: ? Your health care provider tells you not to drink. ? You are pregnant, may be pregnant, or are planning to become pregnant.  If you drink alcohol: ? Limit how much you use to 0-1 drink a day. ? Limit intake if you are breastfeeding.  Be aware of how much alcohol is in your drink. In the [place] one drink equals one 12 oz bottle of beer (355 mL), one 5 oz glass of wine (148 mL), or one 1 oz glass of hard liquor (44 mL). General instructions  Schedule regular health, dental, and eye exams.  Stay current with your vaccines.  Tell your health care provider if: ? You often feel depressed. ? You have ever been abused or do not feel safe at home. Summary  Adopting a healthy lifestyle and getting preventive care are important in promoting health and wellness.  Follow your health care provider's instructions about healthy diet, exercising, and getting tested or screened for diseases.  Follow your health care provider's instructions on monitoring your cholesterol and blood pressure. This information is not intended to replace advice given to you by your health care provider. Make sure you discuss any questions you have with your health care provider. Document Released: 02/07/2011 Document Revised: 07/18/2018 Document Reviewed: 07/18/2018 Elsevier Patient Education  2020 [place] Panosh M.D.

## 2019-04-17 ENCOUNTER — Other Ambulatory Visit: Payer: Self-pay

## 2019-04-17 ENCOUNTER — Ambulatory Visit (INDEPENDENT_AMBULATORY_CARE_PROVIDER_SITE_OTHER): Payer: Managed Care, Other (non HMO) | Admitting: Internal Medicine

## 2019-04-17 ENCOUNTER — Encounter: Payer: Self-pay | Admitting: Internal Medicine

## 2019-04-17 VITALS — BP 128/72 | HR 84 | Temp 98.7°F | Ht 67.0 in | Wt 187.8 lb

## 2019-04-17 DIAGNOSIS — G4700 Insomnia, unspecified: Secondary | ICD-10-CM

## 2019-04-17 DIAGNOSIS — E781 Pure hyperglyceridemia: Secondary | ICD-10-CM | POA: Diagnosis not present

## 2019-04-17 DIAGNOSIS — Z79899 Other long term (current) drug therapy: Secondary | ICD-10-CM | POA: Diagnosis not present

## 2019-04-17 DIAGNOSIS — Z0000 Encounter for general adult medical examination without abnormal findings: Secondary | ICD-10-CM | POA: Diagnosis not present

## 2019-04-17 DIAGNOSIS — E039 Hypothyroidism, unspecified: Secondary | ICD-10-CM

## 2019-04-17 DIAGNOSIS — Z23 Encounter for immunization: Secondary | ICD-10-CM | POA: Diagnosis not present

## 2019-04-17 DIAGNOSIS — I10 Essential (primary) hypertension: Secondary | ICD-10-CM

## 2019-04-17 NOTE — Patient Instructions (Addendum)
Due for tdap .    Come back for pap .   And fasting    Labs to include lipids and  tsh and hg a1c    Get your mammogram .     Health Maintenance, Female Adopting a healthy lifestyle and getting preventive care are important in promoting health and wellness. Ask your health care provider about:  The right schedule for you to have regular tests and exams.  Things you can do on your own to prevent diseases and keep yourself healthy. What should I know about diet, weight, and exercise? Eat a healthy diet   Eat a diet that includes plenty of vegetables, fruits, low-fat dairy products, and lean protein.  Do not eat a lot of foods that are high in solid fats, added sugars, or sodium. Maintain a healthy weight Body mass index (BMI) is used to identify weight problems. It estimates body fat based on height and weight. Your health care provider can help determine your BMI and help you achieve or maintain a healthy weight. Get regular exercise Get regular exercise. This is one of the most important things you can do for your health. Most adults should:  Exercise for at least 150 minutes each week. The exercise should increase your heart rate and make you sweat (moderate-intensity exercise).  Do strengthening exercises at least twice a week. This is in addition to the moderate-intensity exercise.  Spend less time sitting. Even light physical activity can be beneficial. Watch cholesterol and blood lipids Have your blood tested for lipids and cholesterol at 59 years of age, then have this test every 5 years. Have your cholesterol levels checked more often if:  Your lipid or cholesterol levels are high.  You are older than 59 years of age.  You are at high risk for heart disease. What should I know about cancer screening? Depending on your health history and family history, you may need to have cancer screening at various ages. This may include screening for:  Breast cancer.  Cervical  cancer.  Colorectal cancer.  Skin cancer.  Lung cancer. What should I know about heart disease, diabetes, and high blood pressure? Blood pressure and heart disease  High blood pressure causes heart disease and increases the risk of stroke. This is more likely to develop in people who have high blood pressure readings, are of African descent, or are overweight.  Have your blood pressure checked: ? Every 3-5 years if you are 56-35 years of age. ? Every year if you are 60 years old or older. Diabetes Have regular diabetes screenings. This checks your fasting blood sugar level. Have the screening done:  Once every three years after age 16 if you are at a normal weight and have a low risk for diabetes.  More often and at a younger age if you are overweight or have a high risk for diabetes. What should I know about preventing infection? Hepatitis B If you have a higher risk for hepatitis B, you should be screened for this virus. Talk with your health care provider to find out if you are at risk for hepatitis B infection. Hepatitis C Testing is recommended for:  Everyone born from [birth year] through 1966.  Anyone with known risk factors for hepatitis C. Sexually transmitted infections (STIs)  Get screened for STIs, including gonorrhea and chlamydia, if: ? You are sexually active and are younger than 59 years of age. ? You are older than 59 years of age and your health care  provider tells you that you are at risk for this type of infection. ? Your sexual activity has changed since you were last screened, and you are at increased risk for chlamydia or gonorrhea. Ask your health care provider if you are at risk.  Ask your health care provider about whether you are at high risk for HIV. Your health care provider may recommend a prescription medicine to help prevent HIV infection. If you choose to take medicine to prevent HIV, you should first get tested for HIV. You should then be tested every 3  months for as long as you are taking the medicine. Pregnancy  If you are about to stop having your period (premenopausal) and you may become pregnant, seek counseling before you get pregnant.  Take 400 to 800 micrograms (mcg) of folic acid every day if you become pregnant.  Ask for birth control (contraception) if you want to prevent pregnancy. Osteoporosis and menopause Osteoporosis is a disease in which the bones lose minerals and strength with aging. This can result in bone fractures. If you are 41 years old or older, or if you are at risk for osteoporosis and fractures, ask your health care provider if you should:  Be screened for bone loss.  Take a calcium or vitamin D supplement to lower your risk of fractures.  Be given hormone replacement therapy (HRT) to treat symptoms of menopause. Follow these instructions at home: Lifestyle  Do not use any products that contain nicotine or tobacco, such as cigarettes, e-cigarettes, and chewing tobacco. If you need help quitting, ask your health care provider.  Do not use street drugs.  Do not share needles.  Ask your health care provider for help if you need support or information about quitting drugs. Alcohol use  Do not drink alcohol if: ? Your health care provider tells you not to drink. ? You are pregnant, may be pregnant, or are planning to become pregnant.  If you drink alcohol: ? Limit how much you use to 0-1 drink a day. ? Limit intake if you are breastfeeding.  Be aware of how much alcohol is in your drink. In the [place] one drink equals one 12 oz bottle of beer (355 mL), one 5 oz glass of wine (148 mL), or one 1 oz glass of hard liquor (44 mL). General instructions  Schedule regular health, dental, and eye exams.  Stay current with your vaccines.  Tell your health care provider if: ? You often feel depressed. ? You have ever been abused or do not feel safe at home. Summary  Adopting a healthy lifestyle and getting  preventive care are important in promoting health and wellness.  Follow your health care provider's instructions about healthy diet, exercising, and getting tested or screened for diseases.  Follow your health care provider's instructions on monitoring your cholesterol and blood pressure. This information is not intended to replace advice given to you by your health care provider. Make sure you discuss any questions you have with your health care provider. Document Released: 02/07/2011 Document Revised: 07/18/2018 Document Reviewed: 07/18/2018 Elsevier Patient Education  2020 [employer].

## 2019-04-24 ENCOUNTER — Other Ambulatory Visit (INDEPENDENT_AMBULATORY_CARE_PROVIDER_SITE_OTHER): Payer: Managed Care, Other (non HMO)

## 2019-04-24 ENCOUNTER — Telehealth: Payer: Self-pay | Admitting: Internal Medicine

## 2019-04-24 ENCOUNTER — Ambulatory Visit: Payer: Managed Care, Other (non HMO)

## 2019-04-24 ENCOUNTER — Other Ambulatory Visit: Payer: Self-pay

## 2019-04-24 DIAGNOSIS — E039 Hypothyroidism, unspecified: Secondary | ICD-10-CM

## 2019-04-24 DIAGNOSIS — E781 Pure hyperglyceridemia: Secondary | ICD-10-CM | POA: Diagnosis not present

## 2019-04-24 DIAGNOSIS — R0683 Snoring: Secondary | ICD-10-CM

## 2019-04-24 DIAGNOSIS — G4733 Obstructive sleep apnea (adult) (pediatric): Secondary | ICD-10-CM

## 2019-04-24 DIAGNOSIS — Z79899 Other long term (current) drug therapy: Secondary | ICD-10-CM | POA: Diagnosis not present

## 2019-04-24 LAB — LIPID PANEL
Cholesterol: 243 mg/dL — ABNORMAL HIGH (ref 0–200)
HDL: 52.8 mg/dL (ref >39.00)
NonHDL: 190.32
Total CHOL/HDL Ratio: 5
Triglycerides: 230 mg/dL — ABNORMAL HIGH (ref 0.0–149.0)
VLDL: 46 mg/dL — ABNORMAL HIGH (ref 0.0–40.0)

## 2019-04-24 LAB — TSH: TSH: 7.86 u[IU]/mL — ABNORMAL HIGH (ref 0.35–4.50)

## 2019-04-24 LAB — LDL CHOLESTEROL, DIRECT: Direct LDL: 162 mg/dL

## 2019-04-24 LAB — HEMOGLOBIN A1C: Hgb A1c MFr Bld: 5.6 % (ref 4.6–6.5)

## 2019-04-24 LAB — T4, FREE: Free T4: 1.12 ng/dL (ref 0.60–1.60)

## 2019-04-24 NOTE — Telephone Encounter (Signed)
fyi patient is taking with coffee and creamer should patient  not be and should we still send in other prescription or hae patient try without the coffee and creamer ? Labs results mailed

## 2019-04-24 NOTE — Telephone Encounter (Signed)
Let try   Taking only with water first thing in am  And then 1 hours later can have whatever .  The creamer or any dairy can effect absorption of the medication .   Please add a free t4 to the  Lab orders  Thanks

## 2019-04-24 NOTE — Telephone Encounter (Signed)
Result note read to patient, verbalizes understanding. Pt states she takes med in AM with her coffee and creamer. She is agreeable to increase in dosage.Marland Kitchen PLease send to [facility].  Pt will CB to schedule F/U labs. Is planning on signing up for MyChart, offered assistance, declined. Would like copy of results mailed to her home address.   TE as results not routed to [facility].

## 2019-04-25 ENCOUNTER — Other Ambulatory Visit: Payer: Self-pay

## 2019-04-25 DIAGNOSIS — E039 Hypothyroidism, unspecified: Secondary | ICD-10-CM

## 2019-04-25 NOTE — Telephone Encounter (Signed)
And free t4 added

## 2019-04-25 NOTE — Telephone Encounter (Signed)
Pt has been notified.

## 2019-04-29 MED FILL — SPRYCEL 50 MG TABLET: 50 | 30 days supply | Qty: 30 | Fill #10

## 2019-05-01 DIAGNOSIS — G4733 Obstructive sleep apnea (adult) (pediatric): Secondary | ICD-10-CM

## 2019-05-02 ENCOUNTER — Encounter: Payer: Self-pay | Admitting: Pulmonary Disease

## 2019-05-02 ENCOUNTER — Telehealth: Payer: Self-pay | Admitting: Pulmonary Disease

## 2019-05-02 DIAGNOSIS — G4733 Obstructive sleep apnea (adult) (pediatric): Secondary | ICD-10-CM | POA: Insufficient documentation

## 2019-05-02 HISTORY — DX: Obstructive sleep apnea (adult) (pediatric): G47.33

## 2019-05-02 NOTE — Telephone Encounter (Signed)
Called and spoke to pt. Informed her of the results and recs per VS. Pt verbalized understanding and states she will not be able to make an appt until November due to caring for both her parents. Pt states she cant make an appt at this time but will call back. Will keep message in triage to follow up on. Will need to send to VS as an FYI after OV has been made.

## 2019-05-02 NOTE — Telephone Encounter (Signed)
HST 04/24/19 >> AHI 13.2, SpO2 low 73%.   Please inform her that her sleep study shows mild obstructive sleep apnea.  Please arrange for ROV with me or NP to discuss treatment options.

## 2019-05-03 NOTE — Telephone Encounter (Signed)
Attempted to call patient, no answer, left message to call back.  

## 2019-05-06 NOTE — Telephone Encounter (Signed)
ATC Patient.  Left message to call back when available.  

## 2019-05-07 NOTE — Telephone Encounter (Signed)
LMTCB

## 2019-05-08 ENCOUNTER — Encounter: Payer: Self-pay | Admitting: *Deleted

## 2019-05-08 NOTE — Telephone Encounter (Signed)
We have attempted to contact pt several times with no success or call back from pt. Per triage protocol, message will be closed.  Letter has been mailed out to the pt to contact our office.

## 2019-05-09 ENCOUNTER — Other Ambulatory Visit: Payer: Self-pay

## 2019-05-09 ENCOUNTER — Ambulatory Visit (AMBULATORY_SURGERY_CENTER): Payer: Self-pay

## 2019-05-09 VITALS — Temp 96.6°F | Ht 67.0 in | Wt 183.2 lb

## 2019-05-09 DIAGNOSIS — Z1211 Encounter for screening for malignant neoplasm of colon: Secondary | ICD-10-CM

## 2019-05-09 MED ORDER — NA SULFATE-K SULFATE-MG SULF 17.5-3.13-1.6 GM/177ML PO SOLN: 1.0000 | Freq: Once | ORAL | 0 refills | Status: AC

## 2019-05-09 MED FILL — SUPREP BOWEL PREP KIT: 17.5-3.13-1 | 1 days supply | Qty: 354 | Fill #0

## 2019-05-09 NOTE — Progress Notes (Signed)
Denies allergies to eggs or soy products. Denies complication of anesthesia or sedation. Denies use of weight loss medication. Denies use of O2.   Emmi instructions given for colonoscopy.  

## 2019-05-22 MED FILL — SYNTHROID 150 MCG TABLET: 150 | 90 days supply | Qty: 90 | Fill #0

## 2019-05-23 ENCOUNTER — Encounter: Payer: Managed Care, Other (non HMO) | Admitting: Gastroenterology

## 2019-05-29 ENCOUNTER — Encounter: Payer: Managed Care, Other (non HMO) | Admitting: Gastroenterology

## 2019-05-29 LAB — BCR/ABL

## 2019-06-03 ENCOUNTER — Inpatient Hospital Stay: Payer: Managed Care, Other (non HMO) | Attending: Hematology and Oncology

## 2019-06-03 ENCOUNTER — Other Ambulatory Visit: Payer: Self-pay

## 2019-06-03 DIAGNOSIS — C9210 Chronic myeloid leukemia, BCR/ABL-positive, not having achieved remission: Secondary | ICD-10-CM | POA: Diagnosis not present

## 2019-06-03 LAB — COMPREHENSIVE METABOLIC PANEL
ALT: 20 U/L (ref 0–44)
AST: 17 U/L (ref 15–41)
Albumin: 3.9 g/dL (ref 3.5–5.0)
Alkaline Phosphatase: 50 U/L (ref 38–126)
Anion gap: 11 (ref 5–15)
BUN: 12 mg/dL (ref 6–20)
CO2: 22 mmol/L (ref 22–32)
Calcium: 9.2 mg/dL (ref 8.9–10.3)
Chloride: 105 mmol/L (ref 98–111)
Creatinine, Ser: 0.92 mg/dL (ref 0.44–1.00)
GFR calc Af Amer: >60 mL/min (ref >60)
GFR calc non Af Amer: >60 mL/min (ref >60)
Glucose, Bld: 97 mg/dL (ref 70–99)
Potassium: 4 mmol/L (ref 3.5–5.1)
Sodium: 138 mmol/L (ref 135–145)
Total Bilirubin: 0.3 mg/dL (ref 0.3–1.2)
Total Protein: 6.9 g/dL (ref 6.5–8.1)

## 2019-06-03 LAB — CBC WITH DIFFERENTIAL/PLATELET
Abs Immature Granulocytes: 0.02 10*3/uL (ref 0.00–0.07)
Basophils Absolute: 0.1 10*3/uL (ref 0.0–0.1)
Basophils Relative: 1 %
Eosinophils Absolute: 0.3 10*3/uL (ref 0.0–0.5)
Eosinophils Relative: 5 %
HCT: 39.3 % (ref 36.0–46.0)
Hemoglobin: 13.6 g/dL (ref 12.0–15.0)
Immature Granulocytes: 0 %
Lymphocytes Relative: 28 %
Lymphs Abs: 1.6 10*3/uL (ref 0.7–4.0)
MCH: 32.1 pg (ref 26.0–34.0)
MCHC: 34.6 g/dL (ref 30.0–36.0)
MCV: 92.7 fL (ref 80.0–100.0)
Monocytes Absolute: 0.5 10*3/uL (ref 0.1–1.0)
Monocytes Relative: 8 %
Neutro Abs: 3.4 10*3/uL (ref 1.7–7.7)
Neutrophils Relative %: 58 %
Platelets: 299 10*3/uL (ref 150–400)
RBC: 4.24 MIL/uL (ref 3.87–5.11)
RDW: 13 % (ref 11.5–15.5)
WBC: 6 10*3/uL (ref 4.0–10.5)
nRBC: 0 % (ref 0.0–0.2)

## 2019-06-03 MED FILL — SPRYCEL 50 MG TABLET: 50 | 30 days supply | Qty: 30 | Fill #11

## 2019-06-05 ENCOUNTER — Ambulatory Visit: Payer: Managed Care, Other (non HMO) | Admitting: Pulmonary Disease

## 2019-06-12 ENCOUNTER — Ambulatory Visit: Payer: Managed Care, Other (non HMO) | Admitting: Pulmonary Disease

## 2019-06-12 LAB — BCR/ABL

## 2019-06-13 ENCOUNTER — Other Ambulatory Visit: Payer: Self-pay

## 2019-06-13 ENCOUNTER — Telehealth: Payer: Self-pay | Admitting: Hematology and Oncology

## 2019-06-13 ENCOUNTER — Inpatient Hospital Stay: Payer: Managed Care, Other (non HMO) | Attending: Hematology and Oncology | Admitting: Hematology and Oncology

## 2019-06-13 ENCOUNTER — Encounter: Payer: Self-pay | Admitting: Hematology and Oncology

## 2019-06-13 DIAGNOSIS — Z9221 Personal history of antineoplastic chemotherapy: Secondary | ICD-10-CM | POA: Insufficient documentation

## 2019-06-13 DIAGNOSIS — C9210 Chronic myeloid leukemia, BCR/ABL-positive, not having achieved remission: Secondary | ICD-10-CM | POA: Diagnosis not present

## 2019-06-13 DIAGNOSIS — Z79899 Other long term (current) drug therapy: Secondary | ICD-10-CM | POA: Insufficient documentation

## 2019-06-13 NOTE — Assessment & Plan Note (Signed)
The patient has achieved major molecular response without detectable BCR/ABL transcripts She is comfortable to continue on reduced dose Sprycel 50 mg daily She tolerated treatment very well with no major side effects She will continue the same treatment without dose adjustment.   I will continue to see her every 3 months with blood work to be done ahead of time  

## 2019-06-13 NOTE — Progress Notes (Signed)
[facility] OFFICE PROGRESS NOTE  Patient Care Team: Panosh, Standley Brooking, MD as PCP - General [name], MD (Endocrinology) [name], MD (Internal Medicine) Everett Graff, MD as Attending Physician (Obstetrics and Gynecology) Heath Lark, MD as Consulting Physician (Hematology and Oncology) Chesley Mires, MD as Consulting Physician (Pulmonary Disease)  ASSESSMENT & PLAN:  CML (chronic myeloid leukemia) ([place]) The patient has achieved major molecular response without detectable BCR/ABL transcripts She is comfortable to continue on reduced dose Sprycel 50 mg daily She tolerated treatment very well with no major side effects She will continue the same treatment without dose adjustment.   I will continue to see her every 3 months with blood work to be done ahead of time    No orders of the defined types were placed in this encounter.   INTERVAL HISTORY: Please see below for problem oriented charting. She returns for further follow-up for CML She is compliant taking medications as directed She denies difficulties getting her medications refilled Denies fluid retention, diarrhea, cough or shortness of breath She is up-to-date with her influenza vaccination  SUMMARY OF ONCOLOGIC HISTORY: Oncology History  CML (chronic myeloid leukemia) ([place])  05/15/2015 Bone Marrow Biopsy   BM biopsy and FISH confirmed CML [id number]   05/15/2015 - 05/20/2015 Chemotherapy   She started taking Hydrea 1500 mg daily   05/15/2015 Tumor Marker   BCR/ABL b2a2 87.5%, IS 49%   05/21/2015 -  Chemotherapy   She started taking Sprycel 100 mg daily   06/29/2015 Adverse Reaction   She had diarrhea. Dose of Srycel is reduced to 50 mg daily   09/10/2015 Tumor Marker   BCR/ABL b2a2 0.21%, IS 0.1743%   12/11/2015 Pathology Results   BCR/ABL b2a2 0.19%, IS 0.1577%   04/13/2016 Pathology Results   BCR/ABL b2a2 0.29%, IS 0.2407%   07/14/2016 Pathology Results   BCR/ABL b2a2 0.004%, IS  0.0092%. She is in MMR   07/21/2016 Miscellaneous   Dose of Sprycel is reduced to 50 mg daily   09/08/2016 Pathology Results   BCR/ABL b2a2 0.001%, IS 0.0023%. She is in MMR   06/14/2017 Pathology Results   BCR/ABL not detected. She is in MMR   10/11/2017 Pathology Results   BCR/ABL is not detected. She is in MMR   02/14/2018 Pathology Results   BCR/ABL is not detected. She is in MMR   05/17/2018 Pathology Results   BCR/ABL is not detected. She is in MMR   08/22/2018 Pathology Results   BCR/ABL is not detected. She is in MMR   11/30/2018 Pathology Results   BCR/ABL is not detected. She is in MMR   06/03/2019 Pathology Results   BCR/ABL is not detected. She is in MMR     REVIEW OF SYSTEMS:   Constitutional: Denies fevers, chills or abnormal weight loss Eyes: Denies blurriness of vision Ears, nose, mouth, throat, and face: Denies mucositis or sore throat Respiratory: Denies cough, dyspnea or wheezes Cardiovascular: Denies palpitation, chest discomfort or lower extremity swelling Gastrointestinal:  Denies nausea, heartburn or change in bowel habits Skin: Denies abnormal skin rashes Lymphatics: Denies new lymphadenopathy or easy bruising Neurological:Denies numbness, tingling or new weaknesses Behavioral/Psych: Mood is stable, no new changes  All other systems were reviewed with the patient and are negative.  I have reviewed the past medical history, past surgical history, social history and family history with the patient and they are unchanged from previous note.  ALLERGIES:  has No Known Allergies.  MEDICATIONS:  Current Outpatient Medications  Medication  Sig Dispense Refill  . losartan (COZAAR) 50 MG tablet TAKE 1 TABLET BY MOUTH DAILY. 90 tablet 1  . SPRYCEL 50 MG tablet TAKE 1 TABLET (50 MG TOTAL) BY MOUTH DAILY. 90 tablet 3  . SYNTHROID 150 MCG tablet TAKE 1 TABLET (150 MCG TOTAL) BY MOUTH DAILY BEFORE BREAKFAST. 90 tablet 3  . verapamil (CALAN-SR) 240 MG CR tablet  TAKE 1 TABLET (240 MG TOTAL) BY MOUTH AT BEDTIME. 90 tablet 1  . zolpidem (AMBIEN) 10 MG tablet TAKE 1 TABLET (10 MG TOTAL) BY MOUTH AT BEDTIME AS NEEDED. FOR SLEEP 90 tablet 0   No current facility-administered medications for this visit.     PHYSICAL EXAMINATION: ECOG PERFORMANCE STATUS: 0 - Asymptomatic  Vitals:   06/13/19 0825  BP: 136/74  Pulse: 73  Resp: 16  Temp: 97.8 F (36.6 C)  SpO2: 100%   Filed Weights   06/13/19 0825  Weight: 186 lb (84.4 kg)    GENERAL:alert, no distress and comfortable SKIN: skin color, texture, turgor are normal, no rashes or significant lesions EYES: normal, Conjunctiva are pink and non-injected, sclera clear OROPHARYNX:no exudate, no erythema and lips, buccal mucosa, and tongue normal  NECK: supple, thyroid normal size, non-tender, without nodularity LYMPH:  no palpable lymphadenopathy in the cervical, axillary or inguinal LUNGS: clear to auscultation and percussion with normal breathing effort HEART: regular rate & rhythm and no murmurs and no lower extremity edema ABDOMEN:abdomen soft, non-tender and normal bowel sounds Musculoskeletal:no cyanosis of digits and no clubbing  NEURO: alert & oriented x 3 with fluent speech, no focal motor/sensory deficits  LABORATORY DATA:  I have reviewed the data as listed    Component Value Date/Time   NA 138 06/03/2019 0744   NA 138 02/07/2017 0836   K 4.0 06/03/2019 0744   K 4.2 02/07/2017 0836   CL 105 06/03/2019 0744   CO2 22 06/03/2019 0744   CO2 23 02/07/2017 0836   GLUCOSE 97 06/03/2019 0744   GLUCOSE 85 02/07/2017 0836   BUN 12 06/03/2019 0744   BUN 15.5 02/07/2017 0836   CREATININE 0.92 06/03/2019 0744   CREATININE 0.9 02/07/2017 0836   CALCIUM 9.2 06/03/2019 0744   CALCIUM 9.5 02/07/2017 0836   PROT 6.9 06/03/2019 0744   PROT 7.0 02/07/2017 0836   ALBUMIN 3.9 06/03/2019 0744   ALBUMIN 3.8 02/07/2017 0836   AST 17 06/03/2019 0744   AST 25 02/07/2017 0836   ALT 20 06/03/2019 0744    ALT 36 02/07/2017 0836   ALKPHOS 50 06/03/2019 0744   ALKPHOS 55 02/07/2017 0836   BILITOT 0.3 06/03/2019 0744   BILITOT 0.31 02/07/2017 0836   GFRNONAA >60 06/03/2019 0744   GFRAA >60 06/03/2019 0744    No results found for: SPEP, UPEP  Lab Results  Component Value Date   WBC 6.0 06/03/2019   NEUTROABS 3.4 06/03/2019   HGB 13.6 06/03/2019   HCT 39.3 06/03/2019   MCV 92.7 06/03/2019   PLT 299 06/03/2019      Chemistry      Component Value Date/Time   NA 138 06/03/2019 0744   NA 138 02/07/2017 0836   K 4.0 06/03/2019 0744   K 4.2 02/07/2017 0836   CL 105 06/03/2019 0744   CO2 22 06/03/2019 0744   CO2 23 02/07/2017 0836   BUN 12 06/03/2019 0744   BUN 15.5 02/07/2017 0836   CREATININE 0.92 06/03/2019 0744   CREATININE 0.9 02/07/2017 0836      Component Value Date/Time  CALCIUM 9.2 06/03/2019 0744   CALCIUM 9.5 02/07/2017 0836   ALKPHOS 50 06/03/2019 0744   ALKPHOS 55 02/07/2017 0836   AST 17 06/03/2019 0744   AST 25 02/07/2017 0836   ALT 20 06/03/2019 0744   ALT 36 02/07/2017 0836   BILITOT 0.3 06/03/2019 0744   BILITOT 0.31 02/07/2017 0836      All questions were answered. The patient knows to call the clinic with any problems, questions or concerns. No barriers to learning was detected.  I spent 10 minutes counseling the patient face to face. The total time spent in the appointment was 15 minutes and more than 50% was on counseling and review of test results  Heath Lark, MD 06/13/2019 8:32 AM

## 2019-06-13 NOTE — Telephone Encounter (Signed)
Scheduled appt per 11/5 sch message - mailed reminder letter with appt date and time

## 2019-06-17 ENCOUNTER — Other Ambulatory Visit: Payer: Self-pay | Admitting: Internal Medicine

## 2019-06-17 MED FILL — ZOLPIDEM TARTRATE 10 MG TAB: 10 | 90 days supply | Qty: 90 | Fill #0

## 2019-06-17 NOTE — Telephone Encounter (Signed)
Last ov:04/17/2019 Last filled :03/11/2019

## 2019-06-18 ENCOUNTER — Other Ambulatory Visit: Payer: Self-pay

## 2019-06-18 ENCOUNTER — Ambulatory Visit (INDEPENDENT_AMBULATORY_CARE_PROVIDER_SITE_OTHER): Payer: Managed Care, Other (non HMO)

## 2019-06-18 ENCOUNTER — Ambulatory Visit (INDEPENDENT_AMBULATORY_CARE_PROVIDER_SITE_OTHER): Payer: Managed Care, Other (non HMO) | Admitting: Pulmonary Disease

## 2019-06-18 ENCOUNTER — Encounter: Payer: Self-pay | Admitting: Pulmonary Disease

## 2019-06-18 VITALS — BP 128/64 | HR 72 | Temp 97.9°F | Ht 67.0 in | Wt 184.8 lb

## 2019-06-18 DIAGNOSIS — J986 Disorders of diaphragm: Secondary | ICD-10-CM

## 2019-06-18 DIAGNOSIS — G4733 Obstructive sleep apnea (adult) (pediatric): Secondary | ICD-10-CM | POA: Diagnosis not present

## 2019-06-18 IMAGING — DX DG CHEST 2V
2 series · 2 of 2 positions shown · non-contrast
Comparison: [DATE]

CLINICAL DATA: Right diaphragm paralysis

EXAM:
CHEST - 2 VIEW

[chest pa]
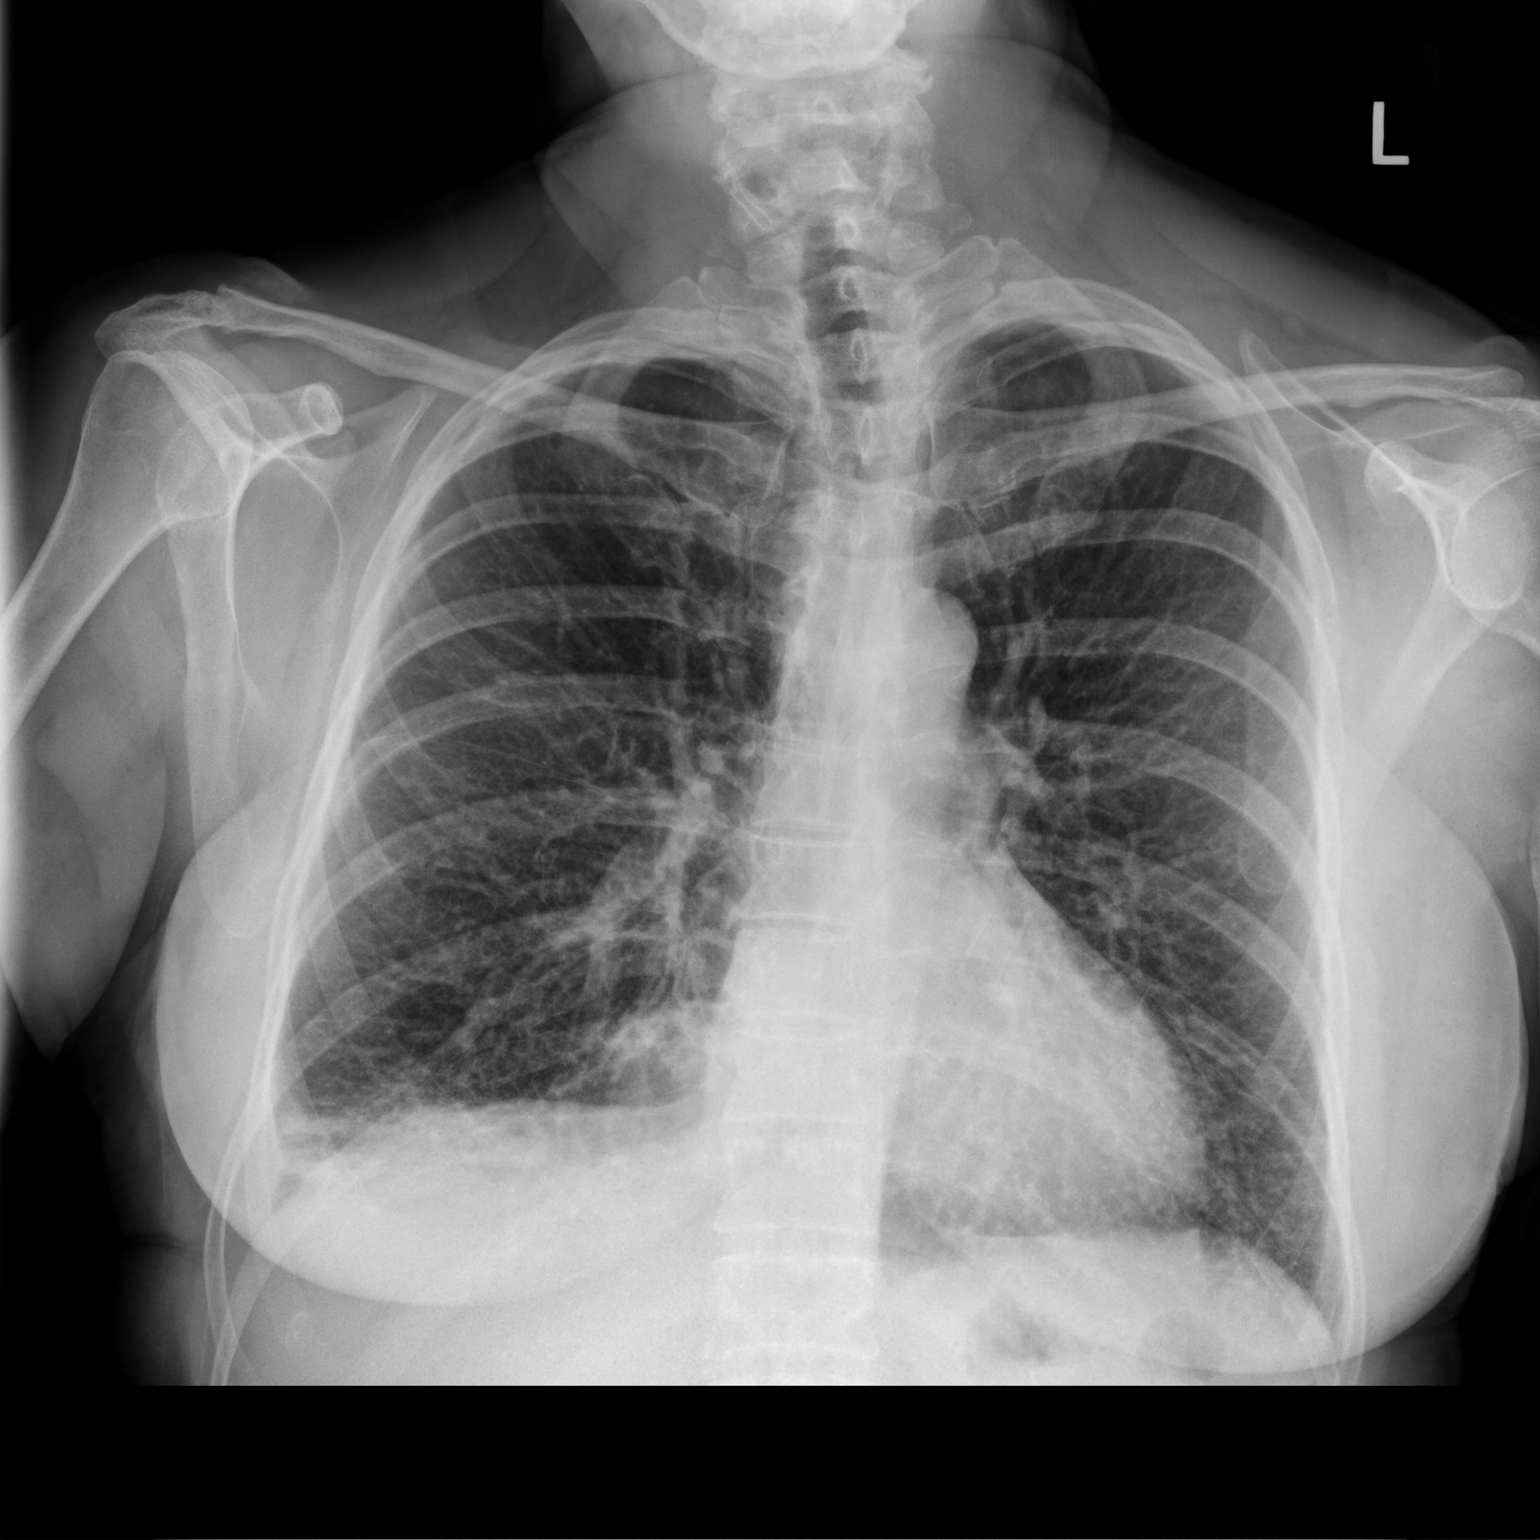

[chest lat]
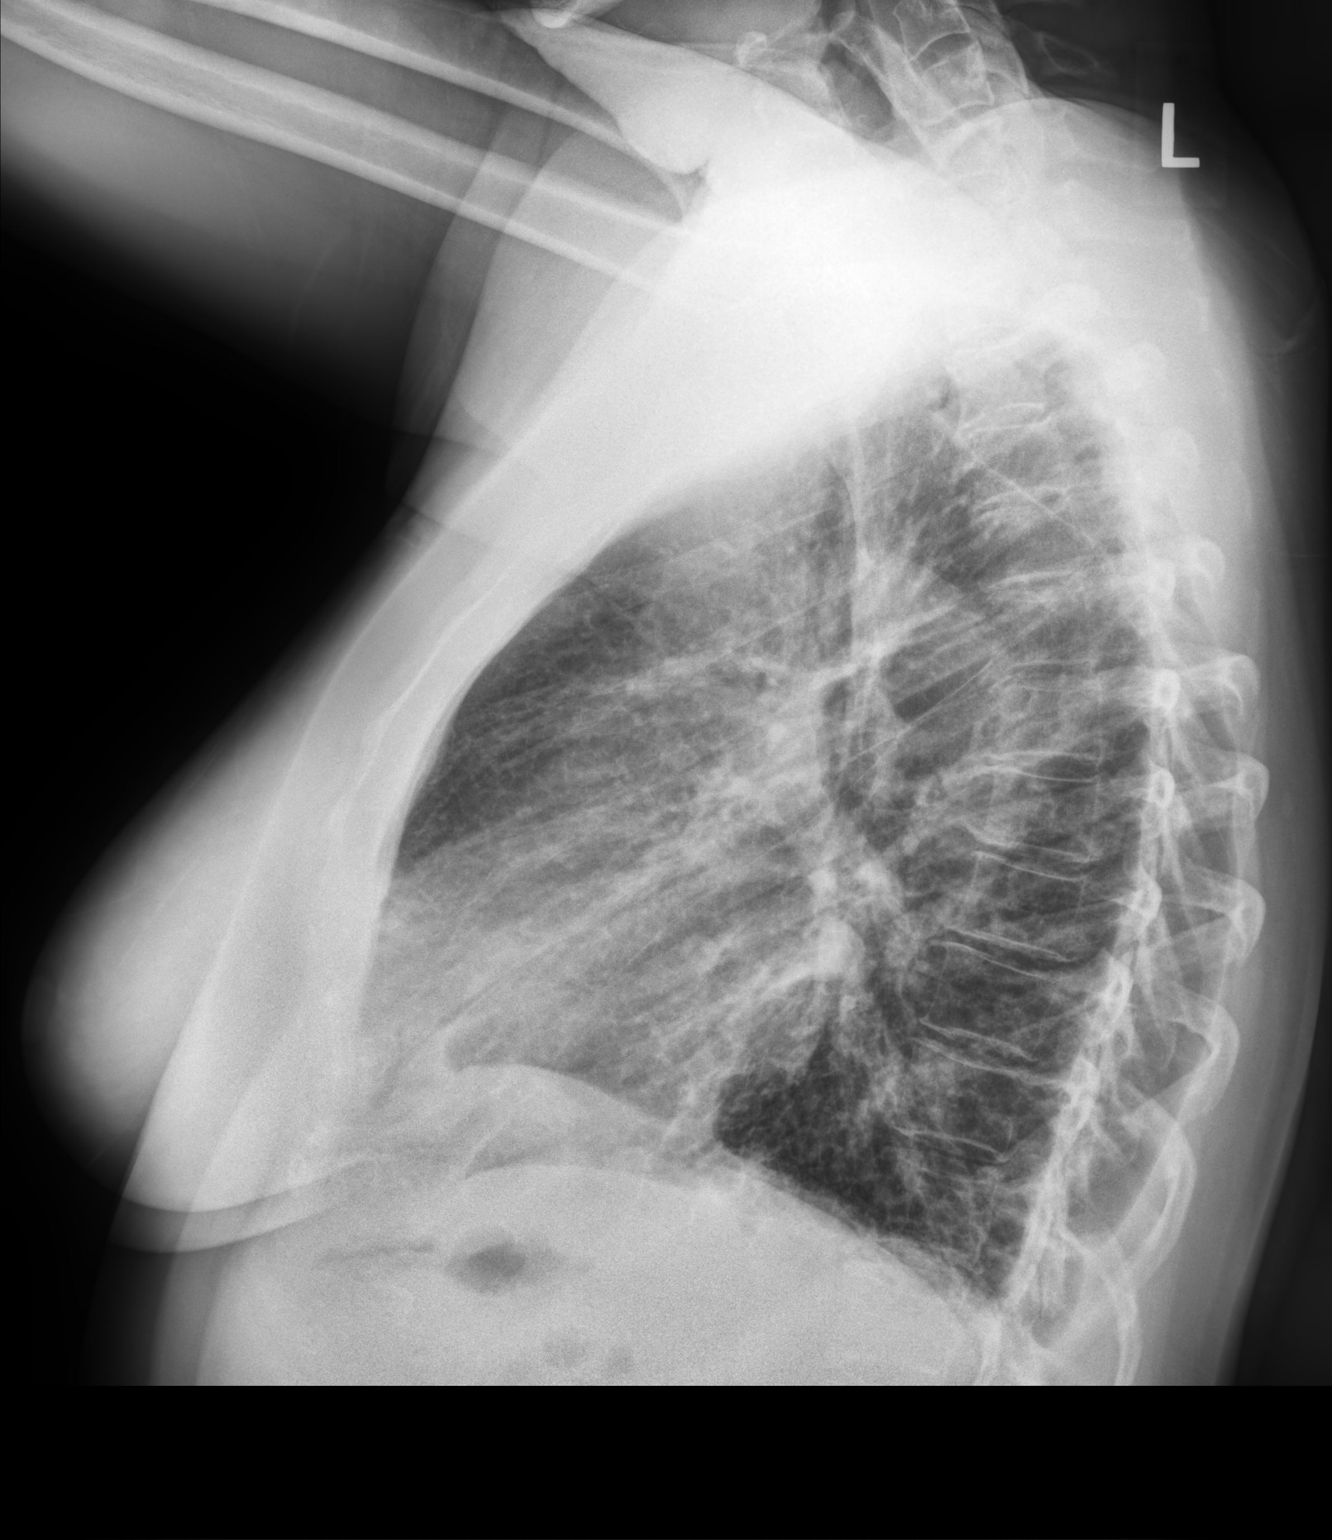

[2 of 2 positions shown; findings below may reference images not displayed]

FINDINGS: The heart size and mediastinal contours are within normal limits.
Slight elevation of the right hemidiaphragm. There is pulmonary
vascular and/or interstitial prominence and probable fibrotic
scarring of the bilateral lung bases. There is a probable trace
right pleural effusion and/or pleural thickening. The visualized
skeletal structures are unremarkable.
IMPRESSION: 1. Slight elevation of the right hemidiaphragm.

2. There is pulmonary vascular and/or interstitial prominence and
probable fibrotic scarring of the bilateral lung bases. There is a
probable trace right pleural effusion and/or pleural thickening.
Findings may reflect acute edema or atypical/viral infection with
underlying scarring or fibrotic interstitial change. CT may be
helpful to further assess.

## 2019-06-18 NOTE — Progress Notes (Signed)
[facility]  Chief Complaint  Patient presents with  . Results    Discuss results of sleep study    Constitutional:  BP 128/64 (BP Location: Right Arm, Patient Position: Sitting, Cuff Size: Large)   Pulse 72   Temp 97.9 F (36.6 C)   Ht 5\' 7"  (1.702 m)   Wt 184 lb 12.8 oz (83.8 kg)   SpO2 98% Comment: on room air  BMI 28.94 kg/m   Past Medical History:  Vit D deficiency, Grave's disease, Hypothyroidism, HTN, Fibroid, Cleft palate s/p repair, SDH s/p craniotomy, Allergies, Chronic myeloid leukemia  Brief Summary:  Christina [name] is a 58 y.o. female with obstructive sleep apnea and right diaphragm elevation.  She had home sleep study in September.  Showed mild OSA.  She still is using ambien at night.  She doesn't recall any accidents or injury to her neck area.  No prior neck or chest surgery.  She was unaware of previous imaging studies showing elevated Rt diaphragm.   Physical Exam:   Appearance - well kempt   ENMT - clear nasal mucosa, midline nasal  septum, no oral exudates, no LAN, trachea midline  Respiratory - normal chest wall, normal respiratory effort, no accessory muscle use, no wheeze/rales  CV - s1s2 regular rate and rhythm, no murmurs, no peripheral edema, radial pulses symmetric  GI - soft, non tender, no masses  Lymph - no adenopathy noted in neck and axillary areas  MSK - normal gait  Ext - no cyanosis, clubbing, or joint inflammation noted  Skin - no rashes, lesions, or ulcers  Neuro - normal strength, oriented x 3  Psych - normal mood and affect   Assessment/Plan:   Obstructive sleep apnea. - sleep study reviewed - treatment options discussed  - will arrange for auto CPAP set up  Sleep onset and sleep maintenance insomnia. - continue ambien for now - once she is established on CPAP will then assess whether she can be gradually tapered off of ambien  Elevated right hemidiaphragm. - has been present  on imaging studies since at least 2015 - reviewed her imaging studies with her  - will repeat CXR today >> if abnormality persists, will then need SNIFF test and possibly PFT to further assess   Patient Instructions  Chest xray today  Will arrange for new auto CPAP set up  Follow up in 2 months  A total of  28 minutes were spent face to face with the patient and more than half of that time involved counseling or coordination of care.   Chesley Mires, MD  Pulmonary/Critical Care Pager: [phone] 06/18/2019, 11:04 AM  Flow Sheet     Pulmonary tests:    Sleep tests:  HST 04/24/19 >> AHI 13.2, SpO2 low 73%.  Medications:   Allergies as of 06/18/2019   No Known Allergies     Medication List       Accurate as of June 18, 2019 11:04 AM. If you have any questions, ask your nurse or doctor.        losartan 50 MG tablet Commonly known as: COZAAR TAKE 1 TABLET BY MOUTH DAILY.   Sprycel 50 MG tablet Generic drug: dasatinib TAKE 1 TABLET (50 MG TOTAL) BY MOUTH DAILY.   Synthroid 150 MCG tablet Generic drug: levothyroxine TAKE 1 TABLET (150 MCG TOTAL) BY MOUTH DAILY BEFORE BREAKFAST.   verapamil 240 MG CR tablet Commonly known as: CALAN-SR TAKE 1 TABLET (240 MG TOTAL) BY  MOUTH AT BEDTIME.   zolpidem 10 MG tablet Commonly known as: AMBIEN TAKE 1 TABLET (10 MG TOTAL) BY MOUTH AT BEDTIME AS NEEDED FOR SLEEP       Past Surgical History:  She  has a past surgical history that includes Mouth surgery; Bunionectomy (1986); Refractive surgery; Novasure ablation (09/28/2010); Hysteroscopy w/D&C (09/28/2010); Dilation and curettage of uterus (09/15/2010); Tonsillectomy (1965); Craniotomy (N/A, 06/17/2016); and Craniotomy (Right, 06/18/2016).  Family History:  Her family history includes Cancer in her maternal uncle; Cancer (age of onset: 59) in her mother; Diabetes in her mother; Hypertension in her father and another family member; Prostate cancer in an other  family member.  Social History:  She  reports that she has never smoked. She has never used smokeless tobacco. She reports current alcohol use. She reports that she does not use drugs.

## 2019-06-18 NOTE — Patient Instructions (Signed)
Chest xray today  Will arrange for new auto CPAP set up  Follow up in 2 months

## 2019-06-21 ENCOUNTER — Telehealth: Payer: Self-pay | Admitting: Pulmonary Disease

## 2019-06-21 NOTE — Telephone Encounter (Signed)
LVMTCB x 1 for patient. 

## 2019-06-21 NOTE — Telephone Encounter (Signed)
Dg Chest 2 View  Result Date: 06/18/2019 CLINICAL DATA:  Right diaphragm paralysis EXAM: CHEST - 2 VIEW COMPARISON:  06/17/2016 FINDINGS: The heart size and mediastinal contours are within normal limits. Slight elevation of the right hemidiaphragm. There is pulmonary vascular and/or interstitial prominence and probable fibrotic scarring of the bilateral lung bases. There is a probable trace right pleural effusion and/or pleural thickening. The visualized skeletal structures are unremarkable. IMPRESSION: 1. Slight elevation of the right hemidiaphragm. 2. There is pulmonary vascular and/or interstitial prominence and probable fibrotic scarring of the bilateral lung bases. There is a probable trace right pleural effusion and/or pleural thickening. Findings may reflect acute edema or atypical/viral infection with underlying scarring or fibrotic interstitial change. CT may be helpful to further assess. Electronically Signed   By: Eddie Candle M.D.   On: 06/18/2019 15:58     Tried calling patient at listed numbers.  Please let her know that her CXR shows right diaphragm is still elevated.  She will need to have SNIFF test done to further assess this.  She also had scarring in her lungs that wasn't present on chest xray from 2017.  Uncertain what this is from, and she would need a high resolution CT chest to assess this further.  Can schedule a tele visit with me if she would like to discuss in more detail before arranging for tests, or will arrange for visit after test results are available.

## 2019-06-26 ENCOUNTER — Other Ambulatory Visit: Payer: Self-pay | Admitting: Hematology

## 2019-06-26 NOTE — Telephone Encounter (Signed)
Should be Dr. Alvy Bimler

## 2019-06-27 ENCOUNTER — Ambulatory Visit (AMBULATORY_SURGERY_CENTER): Payer: Managed Care, Other (non HMO) | Admitting: Gastroenterology

## 2019-06-27 ENCOUNTER — Encounter: Payer: Self-pay | Admitting: Gastroenterology

## 2019-06-27 ENCOUNTER — Other Ambulatory Visit: Payer: Self-pay

## 2019-06-27 VITALS — BP 129/75 | HR 67 | Temp 97.7°F | Resp 19 | Ht 67.0 in | Wt 183.2 lb

## 2019-06-27 DIAGNOSIS — Z538 Procedure and treatment not carried out for other reasons: Secondary | ICD-10-CM

## 2019-06-27 DIAGNOSIS — Z1211 Encounter for screening for malignant neoplasm of colon: Secondary | ICD-10-CM | POA: Diagnosis not present

## 2019-06-27 MED ORDER — SODIUM CHLORIDE 0.9 % IV SOLN: 500.0000 mL | Freq: Once | INTRAVENOUS | Status: DC

## 2019-06-27 NOTE — Op Note (Addendum)
[facility] Patient Name: Christina [name] Procedure Date: 06/27/2019 9:08 AM MRN: [id number] Endoscopist: Mauri Pole , MD Age: 58 Referring MD:  Date of Birth: Jan 11, 1961 Gender: Female Account #: [id number] Procedure:                Colonoscopy Indications:              Screening for colorectal malignant neoplasm Medicines:                Monitored Anesthesia Care Procedure:                Pre-Anesthesia Assessment:                           - Prior to the procedure, a History and Physical                            was performed, and patient medications and                            allergies were reviewed. The patient's tolerance of                            previous anesthesia was also reviewed. The risks                            and benefits of the procedure and the sedation                            options and risks were discussed with the patient.                            All questions were answered, and informed consent                            was obtained. Prior Anticoagulants: The patient has                            taken no previous anticoagulant or antiplatelet                            agents. ASA Grade Assessment: II - A patient with                            mild systemic disease. After reviewing the risks                            and benefits, the patient was deemed in                            satisfactory condition to undergo the procedure.                           After obtaining informed consent, the colonoscope  was passed under direct vision. Throughout the                            procedure, the patient's blood pressure, pulse, and                            oxygen saturations were monitored continuously. The                            Colonoscope was introduced through the anus and                            advanced to the the cecum, identified by                            appendiceal orifice and  ileocecal valve. The                            colonoscopy was performed without difficulty. The                            patient tolerated the procedure well. The quality                            of the bowel preparation was not adequate to                            identify polyps 6 mm and larger in size. The                            ileocecal valve, appendiceal orifice, and rectum                            were photographed. The quality of the bowel                            preparation was evaluated using the BBPS [facility]                            Bowel Preparation Scale) with scores of: Right                            Colon = 1 (portion of mucosa seen, but other areas                            not well seen due to staining, residual stool                            and/or opaque liquid), Transverse Colon = 2 (minor                            amount of residual staining, small fragments of  stool and/or opaque liquid, but mucosa seen well)                            and Left Colon = 2 (minor amount of residual                            staining, small fragments of stool and/or opaque                            liquid, but mucosa seen well). The total BBPS score                            equals 5. The quality of the bowel preparation was                            inadequate. Scope In: 9:13:10 AM Scope Out: 9:33:07 AM Scope Withdrawal Time: 0 hours 12 minutes 49 seconds  Total Procedure Duration: 0 hours 19 minutes 57 seconds  Findings:                 The perianal and digital rectal examinations were                            normal.                           A moderate amount of stool was found in the entire                            colon, interfering with visualization. Lavage of                            the area was performed using a moderate amount,                            resulting in incomplete clearance with continued                             poor visualization. Scope repeatedly clogged with                            residual material.                           A single small localized angioectasia was found in                            the ascending colon.                           Non-bleeding internal hemorrhoids were found during                            retroflexion. The hemorrhoids were medium-sized. Complications:            No immediate  complications. Estimated Blood Loss:     Estimated blood loss: none. Impression:               - Preparation of the colon was inadequate.                           - Preparation of the colon was inadequate.                           - Stool in the entire examined colon.                           - No specimens collected. Recommendation:           - Patient has a contact number available for                            emergencies. The signs and symptoms of potential                            delayed complications were discussed with the                            patient. Return to normal activities tomorrow.                            Written discharge instructions were provided to the                            patient.                           - Resume previous diet.                           - Continue present medications.                           - Repeat colonoscopy at the next available                            appointment because the bowel preparation was                            suboptimal.                           - For future colonoscopy the patient will require                            an extended preparation. If there are any                            questions, please contact the gastroenterologist. Mauri Pole, MD 06/27/2019 9:42:26 AM This report has been signed electronically.

## 2019-06-27 NOTE — Progress Notes (Signed)
PT taken to PACU. Monitors in place. VSS. Report given to RN. 

## 2019-06-27 NOTE — Patient Instructions (Signed)
YOU HAD AN ENDOSCOPIC PROCEDURE TODAY AT THE [facility]:   Refer to the procedure report that was given to you for any specific questions about what was found during the examination.  If the procedure report does not answer your questions, please call your gastroenterologist to clarify.  If you requested that your care partner not be given the details of your procedure findings, then the procedure report has been included in a sealed envelope for you to review at your convenience later.  YOU SHOULD EXPECT: Some feelings of bloating in the abdomen. Passage of more gas than usual.  Walking can help get rid of the air that was put into your GI tract during the procedure and reduce the bloating. If you had a lower endoscopy (such as a colonoscopy or flexible sigmoidoscopy) you may notice spotting of blood in your stool or on the toilet paper. If you underwent a bowel prep for your procedure, you may not have a normal bowel movement for a few days.  Please Note:  You might notice some irritation and congestion in your nose or some drainage.  This is from the oxygen used during your procedure.  There is no need for concern and it should clear up in a day or so.  SYMPTOMS TO REPORT IMMEDIATELY:   Following lower endoscopy (colonoscopy or flexible sigmoidoscopy):  Excessive amounts of blood in the stool  Significant tenderness or worsening of abdominal pains  Swelling of the abdomen that is new, acute  Fever of 100F or higher  For urgent or emergent issues, a gastroenterologist can be reached at any hour by calling [phone].   DIET:  We do recommend a small meal at first, but then you may proceed to your regular diet.  Drink plenty of fluids but you should avoid alcoholic beverages for 24 hours.  ACTIVITY:  You should plan to take it easy for the rest of today and you should NOT DRIVE or use heavy machinery until tomorrow (because of the sedation medicines used during the test).     FOLLOW UP: Our staff will call the number listed on your records 48-72 hours following your procedure to check on you and address any questions or concerns that you may have regarding the information given to you following your procedure. If we do not reach you, we will leave a message.  We will attempt to reach you two times.  During this call, we will ask if you have developed any symptoms of COVID 19. If you develop any symptoms (ie: fever, flu-like symptoms, shortness of breath, cough etc.) before then, please call [phone].  If you test positive for Covid 19 in the 2 weeks post procedure, please call and report this information to us.    If any biopsies were taken you will be contacted by phone or by letter within the next 1-3 weeks.  Please call us at [phone] if you have not heard about the biopsies in 3 weeks.    SIGNATURES/CONFIDENTIALITY: You and/or your care partner have signed paperwork which will be entered into your electronic medical record.  These signatures attest to the fact that that the information above on your After Visit Summary has been reviewed and is understood.  Full responsibility of the confidentiality of this discharge information lies with you and/or your care-partner. 

## 2019-06-27 NOTE — Progress Notes (Signed)
Pt's states no medical or surgical changes since previsit or office visit.  Temp-jb  V/s-cw 

## 2019-07-01 ENCOUNTER — Telehealth: Payer: Self-pay | Admitting: Pulmonary Disease

## 2019-07-01 ENCOUNTER — Telehealth: Payer: Self-pay | Admitting: *Deleted

## 2019-07-01 NOTE — Telephone Encounter (Signed)
LVMTCB x 2 for patient.

## 2019-07-01 NOTE — Telephone Encounter (Signed)
Left message on f/u call 

## 2019-07-01 NOTE — Telephone Encounter (Signed)
  Follow up Call-  Call back number 06/27/2019  Post procedure Call Back phone  # [phone]  Permission to leave phone message Yes  Some recent data might be hidden     Patient questions:  Do you have a fever, pain , or abdominal swelling? No. Pain Score  0 *  Have you tolerated food without any problems? Yes.    Have you been able to return to your normal activities? Yes.    Do you have any questions about your discharge instructions: Diet   No. Medications  No. Follow up visit  No.  Do you have questions or concerns about your Care? No.  Actions: * If pain score is 4 or above: No action needed, pain <4.  1. Have you developed a fever since your procedure? no  2.   Have you had an respiratory symptoms (SOB or cough) since your procedure? no  3.   Have you tested positive for COVID 19 since your procedure no  4.   Have you had any family members/close contacts diagnosed with the COVID 19 since your procedure?  no   If yes to any of these questions please route to Joylene John, RN and Alphonsa Gin, Therapist, sports.

## 2019-07-01 NOTE — Telephone Encounter (Signed)
See related telephone message dated 06/21/19. Patient will call back after talking with her husband.

## 2019-07-01 NOTE — Telephone Encounter (Signed)
Patient called back. Advised her that we made several attempts to reach her.  Made her aware of the results. Advised that if she wanted to discuss this in more detail with Dr. Halford Chessman a video or televisit can be scheduled. Advised her that as of today there were openings on 07/11/19. The patient stated she will talk with her husband about it and call our office back.

## 2019-07-03 ENCOUNTER — Telehealth: Payer: Self-pay

## 2019-07-03 ENCOUNTER — Other Ambulatory Visit: Payer: Self-pay | Admitting: Hematology and Oncology

## 2019-07-03 MED ORDER — DASATINIB 50 MG PO TABS: ORAL_TABLET | ORAL | 3 refills | Status: DC

## 2019-07-03 MED FILL — SPRYCEL 50 MG TABLET: 50 | 30 days supply | Qty: 30 | Fill #0

## 2019-07-03 NOTE — Telephone Encounter (Signed)
Spoke with Aaron Edelman at [facility]. They have the prescription and are filling it now, however, with specialty drugs he says they only fill 1 month at a time so she will only receive 30 pills today.

## 2019-07-03 NOTE — Telephone Encounter (Signed)
-----   Message from Heath Lark, MD sent at 07/03/2019 10:39 AM EST ----- Regarding: can you call [facility] pharmacy and make sure they have her Spyrcel today? Thanks

## 2019-07-08 MED FILL — VERAPAMIL ER 240 MG TABLET: 240 | 90 days supply | Qty: 90 | Fill #1

## 2019-07-09 DIAGNOSIS — G4733 Obstructive sleep apnea (adult) (pediatric): Secondary | ICD-10-CM | POA: Diagnosis not present

## 2019-07-15 MED FILL — LOSARTAN POTASSIUM 50 MG TA: 50 | 90 days supply | Qty: 90 | Fill #1

## 2019-07-24 ENCOUNTER — Encounter: Payer: Managed Care, Other (non HMO) | Admitting: Gastroenterology

## 2019-07-26 DIAGNOSIS — H524 Presbyopia: Secondary | ICD-10-CM | POA: Diagnosis not present

## 2019-07-29 MED FILL — SPRYCEL 50 MG TABLET: 50 | 30 days supply | Qty: 30 | Fill #1

## 2019-07-31 DIAGNOSIS — Z20828 Contact with and (suspected) exposure to other viral communicable diseases: Secondary | ICD-10-CM | POA: Diagnosis not present

## 2019-07-31 DIAGNOSIS — U071 COVID-19: Secondary | ICD-10-CM

## 2019-07-31 HISTORY — DX: COVID-19: U07.1

## 2019-08-06 ENCOUNTER — Telehealth (INDEPENDENT_AMBULATORY_CARE_PROVIDER_SITE_OTHER): Payer: Managed Care, Other (non HMO) | Admitting: Internal Medicine

## 2019-08-06 ENCOUNTER — Other Ambulatory Visit: Payer: Self-pay

## 2019-08-06 ENCOUNTER — Encounter: Payer: Self-pay | Admitting: Internal Medicine

## 2019-08-06 DIAGNOSIS — U071 COVID-19: Secondary | ICD-10-CM | POA: Diagnosis not present

## 2019-08-06 NOTE — Progress Notes (Signed)
Virtual Visit via Video Note  I connected with@ on 08/06/19 at 11:15 AM EST by a video enabled telemedicine application and verified that I am speaking with the correct person using two identifiers. Location patient: home Location provider:home office Persons participating in the virtual visit: patient, provider  [place] national recommendations  regarding COVID 19 pandemic   video visit is advised over in office visit for this patient.  Patient aware  of the limitations of evaluation and management by telemedicine and  availability of in person appointments. and agreed to proceed.   HPI: Christina [name] presents for video visit She was tested for covid    When husband had a fever after dental visit  On dec 23  She had no sx but test was positive and her  Husbands negative. She has a mild  Wet cough no fever  Samuel [place] sx  NVD chills   Asks about  Other care ? If antibiotic advised  ( her husband is MD)    She has no sob   Her mom going into hospice care .    ROS: See pertinent positives and negatives per HPI.  Past Medical History:  Diagnosis Date  . Allergic rhinitis   . Arthritis   . Cleft palate   . Fibroid   . Grave's disease   . H/O insomnia   . H/O menorrhagia 2012  . History of chicken pox   . Hypertension   . Hyperthyroidism    s/p rai 8/06 now hypothyroid Dr. Suzette Battiest  . Infertility, female   . Leukemia ([place])   . Menorrhagia    Had Novasure  . 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 OSA (obstructive sleep apnea) 05/02/2019  . Polyp of cervix    Endometriel polyp. JO  . Sleep apnea   . Vitamin D deficiency     Past Surgical History:  Procedure Laterality Date  . BUNIONECTOMY  1986  . CRANIOTOMY N/A 06/17/2016   Procedure: RIGHT CRANIOTOMY HEMATOMA EVACUATION SUBDURAL;  Surgeon: Eustace Moore, MD;  Location: [facility];  Service: Neurosurgery;  Laterality: N/A;  . CRANIOTOMY Right 06/18/2016   Procedure: CRANIOTOMY HEMATOMA EVACUATION SUBDURAL RIGHT;  Surgeon: Eustace Moore, MD;  Location: [facility];  Service: Neurosurgery;  Laterality: Right;  . DILATION AND CURETTAGE OF UTERUS  09/15/2010  . HYSTEROSCOPY WITH D & C  09/28/2010  . MOUTH SURGERY    . NOVASURE ABLATION  09/28/2010  . REFRACTIVE SURGERY     for vision  . TONSILLECTOMY  1965    Family History  Problem Relation Age of Onset  . Diabetes Mother   . Cancer Mother 79       ovarian ca  . Hypertension Other   . Prostate cancer Other        grandfather  . Hypertension Father   . Cancer Maternal Uncle        leukemia  . Colon cancer Neg Hx   . Esophageal cancer Neg Hx   . Rectal cancer Neg Hx   . Stomach cancer Neg Hx         Current Outpatient Medications:  .  dasatinib (SPRYCEL) 50 MG tablet, TAKE 1 TABLET (50 MG TOTAL) BY MOUTH DAILY., Disp: 90 tablet, Rfl: 3 .  losartan (COZAAR) 50 MG tablet, TAKE 1 TABLET BY MOUTH DAILY., Disp: 90 tablet, Rfl: 1 .  SYNTHROID 150 MCG tablet, TAKE 1 TABLET (150 MCG TOTAL) BY MOUTH DAILY BEFORE BREAKFAST., Disp: 90 tablet, Rfl: 3 .  verapamil (CALAN-SR) 240 MG CR tablet, TAKE 1 TABLET (240 MG TOTAL) BY MOUTH AT BEDTIME., Disp: 90 tablet, Rfl: 1 .  zolpidem (AMBIEN) 10 MG tablet, TAKE 1 TABLET (10 MG TOTAL) BY MOUTH AT BEDTIME AS NEEDED FOR SLEEP, Disp: 90 tablet, Rfl: 0  EXAM: BP Readings from Last 3 Encounters:  06/27/19 129/75  06/18/19 128/64  06/13/19 136/74    VITALS per patient if applicable:  GENERAL: alert, oriented, appears well and in no acute distress  HEENT: atraumatic, conjunttiva clear, no obvious abnormalities on inspection of external nose and ears  NECK: normal movements of the head and neck  LUNGS: on inspection no signs of respiratory distress, breathing rate appears normal, no obvious gross SOB, gasping or wheezing  CV: no obvious cyanosis  MS: moves all visible extremities without noticeable abnormality  PSYCH/NEURO: pleasant and cooperative, no obvious depression or anxiety,   thought processing grossly  intact   ASSESSMENT AND PLAN:  Discussed the following assessment and plan:    ICD-10-CM   1. Lab test positive for detection of COVID-19 virus  U07.1    apparently mild illness at this time folow expectant managment.     Counseled.  At this time supportive care observe for complications   Expectant management and discussion of plan and treatment with opportunity to ask questions and all were answered. The patient agreed with the plan and demonstrated an understanding of the instructions.   Advised to call back or seek an in-person evaluation if worsening  or having  further concerns . Return if symptoms worsen or fail to improve.   Shanon Ace, MD

## 2019-08-09 DIAGNOSIS — G4733 Obstructive sleep apnea (adult) (pediatric): Secondary | ICD-10-CM | POA: Diagnosis not present

## 2019-08-13 ENCOUNTER — Other Ambulatory Visit: Payer: Self-pay

## 2019-08-13 ENCOUNTER — Ambulatory Visit (AMBULATORY_SURGERY_CENTER): Payer: Self-pay

## 2019-08-13 VITALS — Temp 98.1°F | Ht 67.0 in | Wt 183.0 lb

## 2019-08-13 DIAGNOSIS — Z1211 Encounter for screening for malignant neoplasm of colon: Secondary | ICD-10-CM

## 2019-08-13 MED ORDER — NA SULFATE-K SULFATE-MG SULF 17.5-3.13-1.6 GM/177ML PO SOLN: 1.0000 | Freq: Once | ORAL | 0 refills | Status: AC

## 2019-08-13 MED FILL — SUPREP BOWEL PREP KIT: 17.5-3.13-1 | 1 days supply | Qty: 354 | Fill #0

## 2019-08-13 NOTE — Progress Notes (Signed)
No egg or soy allergy known to patient  No issues with past sedation with any surgeries  or procedures, no intubation problems  No diet pills per patient No home 02 use per patient  No blood thinners per patient  Pt denies issues with constipation  No A fib or A flutter  EMMI video sent to pt's e mail  Pt had colonoscopy 06/27/19, poor prep, 2 day prep ordered this time, pt denies constipation issues.   Instructed pt per [facility] orders to do 3 cups of water not the usual 2 with suprep instructions.   Pt had positve covid test on 12/23.    Due to the COVID-19 pandemic we are asking patients to follow these guidelines. Please only bring one care partner. Please be aware that your care partner may wait in the car in the parking lot or if they feel like they will be too hot to wait in the car, they may wait in the lobby on the 4th floor. All care partners are required to wear a mask the entire time (we do not have any that we can provide them), they need to practice social distancing, and we will do a Covid check for all patient's and care partners when you arrive. Also we will check their temperature and your temperature. If the care partner waits in their car they need to stay in the parking lot the entire time and we will call them on their cell phone when the patient is ready for discharge so they can bring the car to the front of the building. Also all patient's will need to wear a mask into building.

## 2019-08-20 ENCOUNTER — Ambulatory Visit: Payer: Managed Care, Other (non HMO) | Admitting: Pulmonary Disease

## 2019-08-27 ENCOUNTER — Encounter: Payer: Self-pay | Admitting: Gastroenterology

## 2019-08-27 ENCOUNTER — Ambulatory Visit (AMBULATORY_SURGERY_CENTER): Payer: Managed Care, Other (non HMO) | Admitting: Gastroenterology

## 2019-08-27 ENCOUNTER — Other Ambulatory Visit: Payer: Self-pay

## 2019-08-27 VITALS — BP 127/70 | HR 66 | Temp 97.8°F | Resp 13 | Ht 67.0 in | Wt 183.0 lb

## 2019-08-27 DIAGNOSIS — D123 Benign neoplasm of transverse colon: Secondary | ICD-10-CM

## 2019-08-27 DIAGNOSIS — K635 Polyp of colon: Secondary | ICD-10-CM

## 2019-08-27 DIAGNOSIS — Z1211 Encounter for screening for malignant neoplasm of colon: Secondary | ICD-10-CM

## 2019-08-27 MED ORDER — SODIUM CHLORIDE 0.9 % IV SOLN: 500.0000 mL | Freq: Once | INTRAVENOUS | Status: DC

## 2019-08-27 NOTE — Op Note (Addendum)
[facility] Patient Name: Christina [name] Procedure Date: 08/27/2019 8:21 AM MRN: [id number] Endoscopist: Mauri Pole , MD Age: 59 Referring MD:  Date of Birth: Dec 29, 1960 Gender: Female Account #: [id number] Procedure:                Colonoscopy Indications:              Screening for colorectal malignant neoplasm, Last                            colonoscopy: 2020 with inadequate prep Medicines:                Monitored Anesthesia Care Procedure:                Pre-Anesthesia Assessment:                           - Prior to the procedure, a History and Physical                            was performed, and patient medications and                            allergies were reviewed. The patient's tolerance of                            previous anesthesia was also reviewed. The risks                            and benefits of the procedure and the sedation                            options and risks were discussed with the patient.                            All questions were answered, and informed consent                            was obtained. Prior Anticoagulants: The patient has                            taken no previous anticoagulant or antiplatelet                            agents. ASA Grade Assessment: II - A patient with                            mild systemic disease. After reviewing the risks                            and benefits, the patient was deemed in                            satisfactory condition to undergo the procedure.  After obtaining informed consent, the colonoscope                            was passed under direct vision. Throughout the                            procedure, the patient's blood pressure, pulse, and                            oxygen saturations were monitored continuously. The                            Colonoscope was introduced through the anus and                            advanced to the the  cecum, identified by                            appendiceal orifice and ileocecal valve. The                            colonoscopy was performed without difficulty. The                            patient tolerated the procedure well. The quality                            of the bowel preparation was excellent. The                            ileocecal valve, appendiceal orifice, and rectum                            were photographed. Scope In: 8:53:47 AM Scope Out: 9:13:03 AM Scope Withdrawal Time: 0 hours 12 minutes 9 seconds  Total Procedure Duration: 0 hours 19 minutes 16 seconds  Findings:                 The perianal and digital rectal examinations were                            normal.                           A 1 mm polyp was found in the transverse colon. The                            polyp was sessile. The polyp was removed with a                            cold biopsy forceps. Resection and retrieval were                            complete.  A few small patchy angioectasias without bleeding                            were found in the transverse colon and in the                            ascending colon.                           Non-bleeding internal hemorrhoids were found during                            retroflexion. The hemorrhoids were small. Complications:            No immediate complications. Estimated Blood Loss:     Estimated blood loss was minimal. Impression:               - One 1 mm polyp in the transverse colon, removed                            with a cold biopsy forceps. Resected and retrieved.                           - A few non-bleeding colonic angioectasias.                           - Non-bleeding internal hemorrhoids. Recommendation:           - Patient has a contact number available for                            emergencies. The signs and symptoms of potential                            delayed complications were  discussed with the                            patient. Return to normal activities tomorrow.                            Written discharge instructions were provided to the                            patient.                           - Resume previous diet.                           - Continue present medications.                           - Await pathology results.                           - Repeat colonoscopy in 5-10 years for surveillance  based on pathology results. Mauri Pole, MD 08/27/2019 9:22:43 AM This report has been signed electronically.

## 2019-08-27 NOTE — Patient Instructions (Signed)
HANDOUTS PROVIDED ON: POLYPS & HEMORRHOIDS  The polyp removed today have been sent for pathology.  The results can take 1-3 weeks to receive.  When your next colonoscopy should occur will be based on the pathology results.    You may resume your previous diet and medication schedule.  Thank you for allowing [place] to care for you today!!!  YOU HAD AN ENDOSCOPIC PROCEDURE TODAY AT [facility]:   Refer to the procedure report that was given to you for any specific questions about what was found during the examination.  If the procedure report does not answer your questions, please call your gastroenterologist to clarify.  If you requested that your care partner not be given the details of your procedure findings, then the procedure report has been included in a sealed envelope for you to review at your convenience later.  YOU SHOULD EXPECT: Some feelings of bloating in the abdomen. Passage of more gas than usual.  Walking can help get rid of the air that was put into your GI tract during the procedure and reduce the bloating. If you had a lower endoscopy (such as a colonoscopy or flexible sigmoidoscopy) you may notice spotting of blood in your stool or on the toilet paper. If you underwent a bowel prep for your procedure, you may not have a normal bowel movement for a few days.  Please Note:  You might notice some irritation and congestion in your nose or some drainage.  This is from the oxygen used during your procedure.  There is no need for concern and it should clear up in a day or so.  SYMPTOMS TO REPORT IMMEDIATELY:   Following lower endoscopy (colonoscopy or flexible sigmoidoscopy):  Excessive amounts of blood in the stool  Significant tenderness or worsening of abdominal pains  Swelling of the abdomen that is new, acute  Fever of 100F or higher  For urgent or emergent issues, a gastroenterologist can be reached at any hour by calling [phone].   DIET:  We do  recommend a small meal at first, but then you may proceed to your regular diet.  Drink plenty of fluids but you should avoid alcoholic beverages for 24 hours.  ACTIVITY:  You should plan to take it easy for the rest of today and you should NOT DRIVE or use heavy machinery until tomorrow (because of the sedation medicines used during the test).    FOLLOW UP: Our staff will call the number listed on your records 48-72 hours following your procedure to check on you and address any questions or concerns that you may have regarding the information given to you following your procedure. If we do not reach you, we will leave a message.  We will attempt to reach you two times.  During this call, we will ask if you have developed any symptoms of COVID 19. If you develop any symptoms (ie: fever, flu-like symptoms, shortness of breath, cough etc.) before then, please call [phone].  If you test positive for Covid 19 in the 2 weeks post procedure, please call and report this information to [place].    If any biopsies were taken you will be contacted by phone or by letter within the next 1-3 weeks.  Please call us at [phone] if you have not heard about the biopsies in 3 weeks.    SIGNATURES/CONFIDENTIALITY: You and/or your care partner have signed paperwork which will be entered into your electronic medical record.  These signatures attest  to the fact that that the information above on your After Visit Summary has been reviewed and is understood.  Full responsibility of the confidentiality of this discharge information lies with you and/or your care-partner.

## 2019-08-27 NOTE — Progress Notes (Signed)
Called to room to assist during endoscopic procedure.  Patient ID and intended procedure confirmed with present staff. Received instructions for my participation in the procedure from the performing physician.  

## 2019-08-27 NOTE — Progress Notes (Signed)
Report given to PACU, vss 

## 2019-08-29 ENCOUNTER — Telehealth: Payer: Self-pay

## 2019-08-29 NOTE — Telephone Encounter (Signed)
  Follow up Call-  Call back number 08/27/2019 06/27/2019  Post procedure Call Back phone  # [phone]  Permission to leave phone message Yes Yes  Some recent data might be hidden     Patient questions:  Do you have a fever, pain , or abdominal swelling? No. Pain Score  0 *  Have you tolerated food without any problems? Yes.    Have you been able to return to your normal activities? Yes.    Do you have any questions about your discharge instructions: Diet   No. Medications  No. Follow up visit  No.  Do you have questions or concerns about your Care? No.  Actions: * If pain score is 4 or above: No action needed, pain <4.  1. Have you developed a fever since your procedure? no  2.   Have you had an respiratory symptoms (SOB or cough) since your procedure? no  3.   Have you tested positive for COVID 19 since your procedure no  4.   Have you had any family members/close contacts diagnosed with the COVID 19 since your procedure?  no   If yes to any of these questions please route to Joylene John, RN and Alphonsa Gin, Therapist, sports.

## 2019-09-02 MED FILL — SPRYCEL 50 MG TABLET: 50 | 30 days supply | Qty: 30 | Fill #2

## 2019-09-03 ENCOUNTER — Encounter: Payer: Self-pay | Admitting: Gastroenterology

## 2019-09-03 ENCOUNTER — Other Ambulatory Visit: Payer: Self-pay

## 2019-09-03 ENCOUNTER — Inpatient Hospital Stay: Payer: Managed Care, Other (non HMO) | Attending: Hematology and Oncology

## 2019-09-03 DIAGNOSIS — C9210 Chronic myeloid leukemia, BCR/ABL-positive, not having achieved remission: Secondary | ICD-10-CM

## 2019-09-03 LAB — CBC WITH DIFFERENTIAL/PLATELET
Abs Immature Granulocytes: 0.02 10*3/uL (ref 0.00–0.07)
Basophils Absolute: 0.1 10*3/uL (ref 0.0–0.1)
Basophils Relative: 1 %
Eosinophils Absolute: 0.4 10*3/uL (ref 0.0–0.5)
Eosinophils Relative: 8 %
HCT: 38.8 % (ref 36.0–46.0)
Hemoglobin: 13.4 g/dL (ref 12.0–15.0)
Immature Granulocytes: 0 %
Lymphocytes Relative: 19 %
Lymphs Abs: 0.9 10*3/uL (ref 0.7–4.0)
MCH: 31.8 pg (ref 26.0–34.0)
MCHC: 34.5 g/dL (ref 30.0–36.0)
MCV: 91.9 fL (ref 80.0–100.0)
Monocytes Absolute: 0.6 10*3/uL (ref 0.1–1.0)
Monocytes Relative: 12 %
Neutro Abs: 3 10*3/uL (ref 1.7–7.7)
Neutrophils Relative %: 60 %
Platelets: 240 10*3/uL (ref 150–400)
RBC: 4.22 MIL/uL (ref 3.87–5.11)
RDW: 13.5 % (ref 11.5–15.5)
WBC: 5 10*3/uL (ref 4.0–10.5)
nRBC: 0 % (ref 0.0–0.2)

## 2019-09-03 LAB — COMPREHENSIVE METABOLIC PANEL
ALT: 18 U/L (ref 0–44)
AST: 15 U/L (ref 15–41)
Albumin: 3.9 g/dL (ref 3.5–5.0)
Alkaline Phosphatase: 60 U/L (ref 38–126)
Anion gap: 10 (ref 5–15)
BUN: 13 mg/dL (ref 6–20)
CO2: 23 mmol/L (ref 22–32)
Calcium: 8.9 mg/dL (ref 8.9–10.3)
Chloride: 105 mmol/L (ref 98–111)
Creatinine, Ser: 0.79 mg/dL (ref 0.44–1.00)
GFR calc Af Amer: >60 mL/min (ref >60)
GFR calc non Af Amer: >60 mL/min (ref >60)
Glucose, Bld: 94 mg/dL (ref 70–99)
Potassium: 3.9 mmol/L (ref 3.5–5.1)
Sodium: 138 mmol/L (ref 135–145)
Total Bilirubin: 0.4 mg/dL (ref 0.3–1.2)
Total Protein: 6.8 g/dL (ref 6.5–8.1)

## 2019-09-03 MED FILL — SYNTHROID 150 MCG TABLET: 150 | 90 days supply | Qty: 90 | Fill #1

## 2019-09-09 DIAGNOSIS — G4733 Obstructive sleep apnea (adult) (pediatric): Secondary | ICD-10-CM | POA: Diagnosis not present

## 2019-09-11 ENCOUNTER — Other Ambulatory Visit: Payer: Self-pay | Admitting: Internal Medicine

## 2019-09-11 LAB — BCR/ABL

## 2019-09-11 NOTE — Telephone Encounter (Signed)
Last filled:06/17/2019 Last ov:08/06/2019

## 2019-09-13 ENCOUNTER — Inpatient Hospital Stay: Payer: Managed Care, Other (non HMO) | Attending: Hematology and Oncology | Admitting: Hematology and Oncology

## 2019-09-13 ENCOUNTER — Other Ambulatory Visit: Payer: Self-pay

## 2019-09-13 ENCOUNTER — Encounter: Payer: Self-pay | Admitting: Hematology and Oncology

## 2019-09-13 DIAGNOSIS — Z79899 Other long term (current) drug therapy: Secondary | ICD-10-CM | POA: Diagnosis not present

## 2019-09-13 DIAGNOSIS — Z9221 Personal history of antineoplastic chemotherapy: Secondary | ICD-10-CM | POA: Diagnosis not present

## 2019-09-13 DIAGNOSIS — C9210 Chronic myeloid leukemia, BCR/ABL-positive, not having achieved remission: Secondary | ICD-10-CM | POA: Diagnosis not present

## 2019-09-13 MED FILL — ZOLPIDEM TARTRATE 10 MG TAB: 10 | 90 days supply | Qty: 90 | Fill #0

## 2019-09-13 NOTE — Assessment & Plan Note (Signed)
The patient has achieved major molecular response without detectable BCR/ABL transcripts She is comfortable to continue on reduced dose Sprycel 50 mg daily She tolerated treatment very well with no major side effects She will continue the same treatment without dose adjustment.   I will continue to see her every 3 months with blood work to be done ahead of time

## 2019-09-13 NOTE — Progress Notes (Signed)
[place] OFFICE PROGRESS NOTE  Patient Care Team: Panosh, Standley Brooking, MD as PCP - General [name], MD (Endocrinology) [name], MD (Internal Medicine) Everett Graff, MD as Attending Physician (Obstetrics and Gynecology) Heath Lark, MD as Consulting Physician (Hematology and Oncology) Chesley Mires, MD as Consulting Physician (Pulmonary Disease)  ASSESSMENT & PLAN:  CML (chronic myeloid leukemia) ([place]) The patient has achieved major molecular response without detectable BCR/ABL transcripts She is comfortable to continue on reduced dose Sprycel 50 mg daily She tolerated treatment very well with no major side effects She will continue the same treatment without dose adjustment.   I will continue to see her every 3 months with blood work to be done ahead of time    No orders of the defined types were placed in this encounter.   All questions were answered. The patient knows to call the clinic with any problems, questions or concerns. The total time spent in the appointment was 15 minutes encounter with patients including review of chart and various tests results, discussions about plan of care and coordination of care plan   Heath Lark, MD 09/13/2019 9:36 AM  INTERVAL HISTORY: Please see below for problem oriented charting. She returns for further follow-up The patient developed COVID-19 infection back in December but she did not develop any form of major complications at the time She recovered at home with social isolation Otherwise, she tolerated Sprycel well No recent cough, chest pain or shortness of breath Denies fluid retention  SUMMARY OF ONCOLOGIC HISTORY: Oncology History  CML (chronic myeloid leukemia) ([place])  05/15/2015 Bone Marrow Biopsy   BM biopsy and FISH confirmed CML [id number]   05/15/2015 - 05/20/2015 Chemotherapy   She started taking Hydrea 1500 mg daily   05/15/2015 Tumor Marker   BCR/ABL b2a2 87.5%, IS 49%   05/21/2015 -   Chemotherapy   She started taking Sprycel 100 mg daily   06/29/2015 Adverse Reaction   She had diarrhea. Dose of Srycel is reduced to 50 mg daily   09/10/2015 Tumor Marker   BCR/ABL b2a2 0.21%, IS 0.1743%   12/11/2015 Pathology Results   BCR/ABL b2a2 0.19%, IS 0.1577%   04/13/2016 Pathology Results   BCR/ABL b2a2 0.29%, IS 0.2407%   07/14/2016 Pathology Results   BCR/ABL b2a2 0.004%, IS 0.0092%. She is in MMR   07/21/2016 Miscellaneous   Dose of Sprycel is reduced to 50 mg daily   09/08/2016 Pathology Results   BCR/ABL b2a2 0.001%, IS 0.0023%. She is in MMR   06/14/2017 Pathology Results   BCR/ABL not detected. She is in MMR   10/11/2017 Pathology Results   BCR/ABL is not detected. She is in MMR   02/14/2018 Pathology Results   BCR/ABL is not detected. She is in MMR   05/17/2018 Pathology Results   BCR/ABL is not detected. She is in MMR   08/22/2018 Pathology Results   BCR/ABL is not detected. She is in MMR   11/30/2018 Pathology Results   BCR/ABL is not detected. She is in MMR   06/03/2019 Pathology Results   BCR/ABL is not detected. She is in MMR   09/03/2019 Pathology Results   BCR/ABL is not detected. She is in MMR     REVIEW OF SYSTEMS:   Constitutional: Denies fevers, chills or abnormal weight loss Eyes: Denies blurriness of vision Ears, nose, mouth, throat, and face: Denies mucositis or sore throat Respiratory: Denies cough, dyspnea or wheezes Cardiovascular: Denies palpitation, chest discomfort or lower extremity swelling Gastrointestinal:  Denies  nausea, heartburn or change in bowel habits Skin: Denies abnormal skin rashes Lymphatics: Denies new lymphadenopathy or easy bruising Neurological:Denies numbness, tingling or new weaknesses Behavioral/Psych: Mood is stable, no new changes  All other systems were reviewed with the patient and are negative.  I have reviewed the past medical history, past surgical history, social history and family history with the  patient and they are unchanged from previous note.  ALLERGIES:  has No Known Allergies.  MEDICATIONS:  Current Outpatient Medications  Medication Sig Dispense Refill  . dasatinib (SPRYCEL) 50 MG tablet TAKE 1 TABLET (50 MG TOTAL) BY MOUTH DAILY. 90 tablet 3  . losartan (COZAAR) 50 MG tablet TAKE 1 TABLET BY MOUTH DAILY. 90 tablet 1  . SYNTHROID 150 MCG tablet TAKE 1 TABLET (150 MCG TOTAL) BY MOUTH DAILY BEFORE BREAKFAST. 90 tablet 3  . verapamil (CALAN-SR) 240 MG CR tablet TAKE 1 TABLET (240 MG TOTAL) BY MOUTH AT BEDTIME. 90 tablet 1  . zolpidem (AMBIEN) 10 MG tablet TAKE 1 TABLET (10 MG TOTAL) BY MOUTH AT BEDTIME AS NEEDED FOR SLEEP 90 tablet 0   No current facility-administered medications for this visit.    PHYSICAL EXAMINATION: ECOG PERFORMANCE STATUS: 0 - Asymptomatic  Vitals:   09/13/19 0931  BP: 124/62  Pulse: 61  Resp: 18  Temp: 97.8 F (36.6 C)  SpO2: 99%   Filed Weights   09/13/19 0931  Weight: 178 lb (80.7 kg)    GENERAL:alert, no distress and comfortable SKIN: skin color, texture, turgor are normal, no rashes or significant lesions EYES: normal, Conjunctiva are pink and non-injected, sclera clear OROPHARYNX:no exudate, no erythema and lips, buccal mucosa, and tongue normal  NECK: supple, thyroid normal size, non-tender, without nodularity LYMPH:  no palpable lymphadenopathy in the cervical, axillary or inguinal LUNGS: clear to auscultation and percussion with normal breathing effort HEART: regular rate & rhythm and no murmurs and no lower extremity edema ABDOMEN:abdomen soft, non-tender and normal bowel sounds Musculoskeletal:no cyanosis of digits and no clubbing  NEURO: alert & oriented x 3 with fluent speech, no focal motor/sensory deficits  LABORATORY DATA:  I have reviewed the data as listed    Component Value Date/Time   NA 138 09/03/2019 0756   NA 138 02/07/2017 0836   K 3.9 09/03/2019 0756   K 4.2 02/07/2017 0836   CL 105 09/03/2019 0756   CO2  23 09/03/2019 0756   CO2 23 02/07/2017 0836   GLUCOSE 94 09/03/2019 0756   GLUCOSE 85 02/07/2017 0836   BUN 13 09/03/2019 0756   BUN 15.5 02/07/2017 0836   CREATININE 0.79 09/03/2019 0756   CREATININE 0.9 02/07/2017 0836   CALCIUM 8.9 09/03/2019 0756   CALCIUM 9.5 02/07/2017 0836   PROT 6.8 09/03/2019 0756   PROT 7.0 02/07/2017 0836   ALBUMIN 3.9 09/03/2019 0756   ALBUMIN 3.8 02/07/2017 0836   AST 15 09/03/2019 0756   AST 25 02/07/2017 0836   ALT 18 09/03/2019 0756   ALT 36 02/07/2017 0836   ALKPHOS 60 09/03/2019 0756   ALKPHOS 55 02/07/2017 0836   BILITOT 0.4 09/03/2019 0756   BILITOT 0.31 02/07/2017 0836   GFRNONAA >60 09/03/2019 0756   GFRAA >60 09/03/2019 0756    No results found for: SPEP, UPEP  Lab Results  Component Value Date   WBC 5.0 09/03/2019   NEUTROABS 3.0 09/03/2019   HGB 13.4 09/03/2019   HCT 38.8 09/03/2019   MCV 91.9 09/03/2019   PLT 240 09/03/2019  Chemistry      Component Value Date/Time   NA 138 09/03/2019 0756   NA 138 02/07/2017 0836   K 3.9 09/03/2019 0756   K 4.2 02/07/2017 0836   CL 105 09/03/2019 0756   CO2 23 09/03/2019 0756   CO2 23 02/07/2017 0836   BUN 13 09/03/2019 0756   BUN 15.5 02/07/2017 0836   CREATININE 0.79 09/03/2019 0756   CREATININE 0.9 02/07/2017 0836      Component Value Date/Time   CALCIUM 8.9 09/03/2019 0756   CALCIUM 9.5 02/07/2017 0836   ALKPHOS 60 09/03/2019 0756   ALKPHOS 55 02/07/2017 0836   AST 15 09/03/2019 0756   AST 25 02/07/2017 0836   ALT 18 09/03/2019 0756   ALT 36 02/07/2017 0836   BILITOT 0.4 09/03/2019 0756   BILITOT 0.31 02/07/2017 0836

## 2019-09-16 ENCOUNTER — Telehealth: Payer: Self-pay | Admitting: Hematology and Oncology

## 2019-09-16 NOTE — Telephone Encounter (Signed)
I left a message regarding schedule  

## 2019-09-27 ENCOUNTER — Telehealth: Payer: Self-pay | Admitting: Hematology and Oncology

## 2019-09-27 NOTE — Telephone Encounter (Signed)
Returned patient's phone call regarding rescheduling April and May appointment times, per patient's request appointment times have been rescheduled.

## 2019-10-01 ENCOUNTER — Telehealth: Payer: Self-pay

## 2019-10-01 MED FILL — SPRYCEL 50 MG TABLET: 50 | 30 days supply | Qty: 30 | Fill #3

## 2019-10-01 NOTE — Telephone Encounter (Signed)
Obtained a Sprycel copay card for patient to make Sprycel copay $0.  I shared the info with [facility] [id number] PCN [id number] ID [id number] [place]  [place] Patient [place] Phone [phone] Fax [phone] 10/01/2019 10:57 AM

## 2019-10-07 ENCOUNTER — Other Ambulatory Visit: Payer: Self-pay | Admitting: Internal Medicine

## 2019-10-07 MED FILL — VERAPAMIL ER 240 MG TABLET: 240 | 90 days supply | Qty: 90 | Fill #0

## 2019-10-14 ENCOUNTER — Other Ambulatory Visit: Payer: Self-pay | Admitting: Internal Medicine

## 2019-10-15 MED FILL — LOSARTAN POTASSIUM 50 MG TA: 50 | 90 days supply | Qty: 90 | Fill #0

## 2019-10-22 ENCOUNTER — Other Ambulatory Visit: Payer: Self-pay

## 2019-10-22 DIAGNOSIS — E039 Hypothyroidism, unspecified: Secondary | ICD-10-CM

## 2019-10-29 ENCOUNTER — Other Ambulatory Visit (INDEPENDENT_AMBULATORY_CARE_PROVIDER_SITE_OTHER): Payer: 59

## 2019-10-29 ENCOUNTER — Other Ambulatory Visit: Payer: Self-pay

## 2019-10-29 DIAGNOSIS — E781 Pure hyperglyceridemia: Secondary | ICD-10-CM | POA: Diagnosis not present

## 2019-10-29 DIAGNOSIS — E039 Hypothyroidism, unspecified: Secondary | ICD-10-CM | POA: Diagnosis not present

## 2019-10-29 LAB — LIPID PANEL
Cholesterol: 199 mg/dL (ref 0–200)
HDL: 53.5 mg/dL (ref >39.00)
LDL Cholesterol: 123 mg/dL — ABNORMAL HIGH (ref 0–99)
NonHDL: 145.48
Total CHOL/HDL Ratio: 4
Triglycerides: 113 mg/dL (ref 0.0–149.0)
VLDL: 22.6 mg/dL (ref 0.0–40.0)

## 2019-10-29 LAB — T3, FREE: T3, Free: 3.2 pg/mL (ref 2.3–4.2)

## 2019-10-29 LAB — T4, FREE: Free T4: 1.27 ng/dL (ref 0.60–1.60)

## 2019-10-29 LAB — TSH: TSH: 7.81 u[IU]/mL — ABNORMAL HIGH (ref 0.35–4.50)

## 2019-10-31 MED FILL — SPRYCEL 50 MG TABLET: 50 | 30 days supply | Qty: 30 | Fill #4

## 2019-11-03 NOTE — Progress Notes (Signed)
Advise increase synthroid to 175 mcg per day  .Please send in to pharmacy of her choice  disp 90 refill x 2    advise repeat TSH in 2-3 months after change.   Cholesterol improved

## 2019-11-04 ENCOUNTER — Other Ambulatory Visit: Payer: Self-pay | Admitting: Internal Medicine

## 2019-11-04 MED ORDER — LEVOTHYROXINE SODIUM 175 MCG PO TABS: 175.0000 ug | ORAL_TABLET | Freq: Every day | ORAL | 2 refills | Status: DC

## 2019-11-04 MED ORDER — SYNTHROID 175 MCG PO TABS: 175.0000 ug | ORAL_TABLET | Freq: Every day | ORAL | 2 refills | Status: DC

## 2019-11-04 MED FILL — SYNTHROID 175 MCG TABLET: 175 | 90 days supply | Qty: 90 | Fill #0

## 2019-11-04 NOTE — Addendum Note (Signed)
Addended by: Gwenyth Ober R on: 11/04/2019 11:59 AM   Modules accepted: Orders

## 2019-11-12 ENCOUNTER — Other Ambulatory Visit: Payer: Self-pay

## 2019-11-12 DIAGNOSIS — R7989 Other specified abnormal findings of blood chemistry: Secondary | ICD-10-CM

## 2019-11-12 DIAGNOSIS — E039 Hypothyroidism, unspecified: Secondary | ICD-10-CM

## 2019-11-15 ENCOUNTER — Telehealth: Payer: Self-pay

## 2019-11-15 MED ORDER — FLUOCINONIDE 0.05 % EX CREA: 1.0000 "application " | TOPICAL_CREAM | Freq: Two times a day (BID) | CUTANEOUS | 1 refills | Status: DC

## 2019-11-15 MED FILL — FLUOCINONIDE 0.05% CREAM: 0.05 | 7 days supply | Qty: 30 | Fill #0

## 2019-11-15 NOTE — Telephone Encounter (Signed)
Patient is requesting Fluocinonide cream 30 grams for her Eczema to be sent to [facility]. Can you send?

## 2019-11-25 MED FILL — SPRYCEL 50 MG TABLET: 50 | 30 days supply | Qty: 30 | Fill #5

## 2019-12-03 ENCOUNTER — Other Ambulatory Visit: Payer: Managed Care, Other (non HMO)

## 2019-12-03 ENCOUNTER — Inpatient Hospital Stay: Payer: 59 | Attending: Hematology and Oncology

## 2019-12-03 ENCOUNTER — Other Ambulatory Visit: Payer: Self-pay

## 2019-12-03 DIAGNOSIS — C9210 Chronic myeloid leukemia, BCR/ABL-positive, not having achieved remission: Secondary | ICD-10-CM | POA: Diagnosis not present

## 2019-12-03 LAB — COMPREHENSIVE METABOLIC PANEL
ALT: 19 U/L (ref 0–44)
AST: 19 U/L (ref 15–41)
Albumin: 3.9 g/dL (ref 3.5–5.0)
Alkaline Phosphatase: 46 U/L (ref 38–126)
Anion gap: 12 (ref 5–15)
BUN: 23 mg/dL — ABNORMAL HIGH (ref 6–20)
CO2: 23 mmol/L (ref 22–32)
Calcium: 9.4 mg/dL (ref 8.9–10.3)
Chloride: 105 mmol/L (ref 98–111)
Creatinine, Ser: 0.9 mg/dL (ref 0.44–1.00)
GFR calc Af Amer: >60 mL/min (ref >60)
GFR calc non Af Amer: >60 mL/min (ref >60)
Glucose, Bld: 93 mg/dL (ref 70–99)
Potassium: 4.1 mmol/L (ref 3.5–5.1)
Sodium: 140 mmol/L (ref 135–145)
Total Bilirubin: 0.4 mg/dL (ref 0.3–1.2)
Total Protein: 6.9 g/dL (ref 6.5–8.1)

## 2019-12-03 LAB — CBC WITH DIFFERENTIAL/PLATELET
Abs Immature Granulocytes: 0.01 10*3/uL (ref 0.00–0.07)
Basophils Absolute: 0.1 10*3/uL (ref 0.0–0.1)
Basophils Relative: 1 %
Eosinophils Absolute: 0.4 10*3/uL (ref 0.0–0.5)
Eosinophils Relative: 7 %
HCT: 39.6 % (ref 36.0–46.0)
Hemoglobin: 13.6 g/dL (ref 12.0–15.0)
Immature Granulocytes: 0 %
Lymphocytes Relative: 26 %
Lymphs Abs: 1.4 10*3/uL (ref 0.7–4.0)
MCH: 31.1 pg (ref 26.0–34.0)
MCHC: 34.3 g/dL (ref 30.0–36.0)
MCV: 90.6 fL (ref 80.0–100.0)
Monocytes Absolute: 0.4 10*3/uL (ref 0.1–1.0)
Monocytes Relative: 7 %
Neutro Abs: 3.2 10*3/uL (ref 1.7–7.7)
Neutrophils Relative %: 59 %
Platelets: 268 10*3/uL (ref 150–400)
RBC: 4.37 MIL/uL (ref 3.87–5.11)
RDW: 12.8 % (ref 11.5–15.5)
WBC: 5.5 10*3/uL (ref 4.0–10.5)
nRBC: 0 % (ref 0.0–0.2)

## 2019-12-09 ENCOUNTER — Other Ambulatory Visit: Payer: Self-pay | Admitting: Internal Medicine

## 2019-12-09 MED FILL — ZOLPIDEM TARTRATE 10 MG TAB: 10 | 90 days supply | Qty: 90 | Fill #0

## 2019-12-09 NOTE — Telephone Encounter (Signed)
Last OV 08/06/2019  Last filled 09/11/2019, # 90 with 0 refills

## 2019-12-09 NOTE — Telephone Encounter (Signed)
Sent in electronically .  

## 2019-12-12 ENCOUNTER — Telehealth: Payer: Self-pay | Admitting: Hematology and Oncology

## 2019-12-12 ENCOUNTER — Inpatient Hospital Stay: Payer: 59 | Attending: Hematology and Oncology | Admitting: Hematology and Oncology

## 2019-12-12 ENCOUNTER — Encounter: Payer: Self-pay | Admitting: Hematology and Oncology

## 2019-12-12 ENCOUNTER — Other Ambulatory Visit: Payer: Self-pay

## 2019-12-12 ENCOUNTER — Ambulatory Visit: Payer: Managed Care, Other (non HMO) | Admitting: Hematology and Oncology

## 2019-12-12 DIAGNOSIS — C9210 Chronic myeloid leukemia, BCR/ABL-positive, not having achieved remission: Secondary | ICD-10-CM | POA: Diagnosis not present

## 2019-12-12 NOTE — Telephone Encounter (Signed)
Scheduled appts per 5/6 sch msg. Pt didn't answer and voicemail box was full. Sent msg to HIM to mail appt reminder and calendar.

## 2019-12-12 NOTE — Assessment & Plan Note (Signed)
The patient has achieved major molecular response without detectable BCR/ABL transcripts She is comfortable to continue on reduced dose Sprycel 50 mg daily She tolerated treatment very well with no major side effects She will continue the same treatment without dose adjustment.   I will continue to see her every 3 months with blood work to be done ahead of time

## 2019-12-12 NOTE — Progress Notes (Signed)
[place] OFFICE PROGRESS NOTE  Patient Care Team: Panosh, Standley Brooking, MD as PCP - General [name], MD (Endocrinology) [name], MD (Internal Medicine) Everett Graff, MD as Attending Physician (Obstetrics and Gynecology) Heath Lark, MD as Consulting Physician (Hematology and Oncology) Chesley Mires, MD as Consulting Physician (Pulmonary Disease)  ASSESSMENT & PLAN:  CML (chronic myeloid leukemia) ([place]) The patient has achieved major molecular response without detectable BCR/ABL transcripts She is comfortable to continue on reduced dose Sprycel 50 mg daily She tolerated treatment very well with no major side effects She will continue the same treatment without dose adjustment.   I will continue to see her every 3 months with blood work to be done ahead of time    No orders of the defined types were placed in this encounter.   All questions were answered. The patient knows to call the clinic with any problems, questions or concerns. The total time spent in the appointment was 15 minutes encounter with patients including review of chart and various tests results, discussions about plan of care and coordination of care plan   Heath Lark, MD 12/12/2019 8:28 AM  INTERVAL HISTORY: Please see below for problem oriented charting. She returns for further follow-up She is doing well No side effects of treatment so far She has lost 13 pounds since she was last seen here through dietary modification.  This is an intentional weight loss She denies shortness of breath or fluid retention from Sprycel No recent infection, fever or chills SUMMARY OF ONCOLOGIC HISTORY: Oncology History  CML (chronic myeloid leukemia) ([employer])  05/15/2015 Bone Marrow Biopsy   BM biopsy and FISH confirmed CML [id number]   05/15/2015 - 05/20/2015 Chemotherapy   She started taking Hydrea 1500 mg daily   05/15/2015 Tumor Marker   BCR/ABL b2a2 87.5%, IS 49%   05/21/2015 -  Chemotherapy    She started taking Sprycel 100 mg daily   06/29/2015 Adverse Reaction   She had diarrhea. Dose of Srycel is reduced to 50 mg daily   09/10/2015 Tumor Marker   BCR/ABL b2a2 0.21%, IS 0.1743%   12/11/2015 Pathology Results   BCR/ABL b2a2 0.19%, IS 0.1577%   04/13/2016 Pathology Results   BCR/ABL b2a2 0.29%, IS 0.2407%   07/14/2016 Pathology Results   BCR/ABL b2a2 0.004%, IS 0.0092%. She is in MMR   07/21/2016 Miscellaneous   Dose of Sprycel is reduced to 50 mg daily   09/08/2016 Pathology Results   BCR/ABL b2a2 0.001%, IS 0.0023%. She is in MMR   06/14/2017 Pathology Results   BCR/ABL not detected. She is in MMR   10/11/2017 Pathology Results   BCR/ABL is not detected. She is in MMR   02/14/2018 Pathology Results   BCR/ABL is not detected. She is in MMR   05/17/2018 Pathology Results   BCR/ABL is not detected. She is in MMR   08/22/2018 Pathology Results   BCR/ABL is not detected. She is in MMR   11/30/2018 Pathology Results   BCR/ABL is not detected. She is in MMR   06/03/2019 Pathology Results   BCR/ABL is not detected. She is in MMR   09/03/2019 Pathology Results   BCR/ABL is not detected. She is in MMR   12/05/2019 Pathology Results   BCR/ABL is not detected. She is in MMR     REVIEW OF SYSTEMS:   Constitutional: Denies fevers, chills or abnormal weight loss Eyes: Denies blurriness of vision Ears, nose, mouth, throat, and face: Denies mucositis or sore throat Respiratory:  Denies cough, dyspnea or wheezes Cardiovascular: Denies palpitation, chest discomfort or lower extremity swelling Gastrointestinal:  Denies nausea, heartburn or change in bowel habits Skin: Denies abnormal skin rashes Lymphatics: Denies new lymphadenopathy or easy bruising Neurological:Denies numbness, tingling or new weaknesses Behavioral/Psych: Mood is stable, no new changes  All other systems were reviewed with the patient and are negative.  I have reviewed the past medical history, past  surgical history, social history and family history with the patient and they are unchanged from previous note.  ALLERGIES:  has No Known Allergies.  MEDICATIONS:  Current Outpatient Medications  Medication Sig Dispense Refill  . dasatinib (SPRYCEL) 50 MG tablet TAKE 1 TABLET (50 MG TOTAL) BY MOUTH DAILY. 90 tablet 3  . fluocinonide cream (LIDEX) 4.69 % Apply 1 application topically 2 (two) times daily. For eczema 30 g 1  . losartan (COZAAR) 50 MG tablet TAKE 1 TABLET BY MOUTH DAILY. 90 tablet 1  . SYNTHROID 175 MCG tablet Take 1 tablet (175 mcg total) by mouth daily before breakfast. 90 tablet 2  . verapamil (CALAN-SR) 240 MG CR tablet TAKE 1 TABLET BY MOUTH AT BEDTIME. 90 tablet 1  . zolpidem (AMBIEN) 10 MG tablet TAKE 1 TABLET BY MOUTH AT BEDTIME AS NEEDED FOR SLEEP 90 tablet 0   No current facility-administered medications for this visit.    PHYSICAL EXAMINATION: ECOG PERFORMANCE STATUS: 0 - Asymptomatic  Vitals:   12/12/19 0820  BP: 135/68  Pulse: 65  Resp: 18  Temp: 98.2 F (36.8 C)  SpO2: 99%   Filed Weights   12/12/19 0820  Weight: 165 lb 6.4 oz (75 kg)    GENERAL:alert, no distress and comfortable SKIN: skin color, texture, turgor are normal, no rashes or significant lesions EYES: normal, Conjunctiva are pink and non-injected, sclera clear OROPHARYNX:no exudate, no erythema and lips, buccal mucosa, and tongue normal  NECK: supple, thyroid normal size, non-tender, without nodularity LYMPH:  no palpable lymphadenopathy in the cervical, axillary or inguinal LUNGS: clear to auscultation and percussion with normal breathing effort HEART: regular rate & rhythm and no murmurs and no lower extremity edema ABDOMEN:abdomen soft, non-tender and normal bowel sounds Musculoskeletal:no cyanosis of digits and no clubbing  NEURO: alert & oriented x 3 with fluent speech, no focal motor/sensory deficits  LABORATORY DATA:  I have reviewed the data as listed    Component Value  Date/Time   NA 140 12/03/2019 0814   NA 138 02/07/2017 0836   K 4.1 12/03/2019 0814   K 4.2 02/07/2017 0836   CL 105 12/03/2019 0814   CO2 23 12/03/2019 0814   CO2 23 02/07/2017 0836   GLUCOSE 93 12/03/2019 0814   GLUCOSE 85 02/07/2017 0836   BUN 23 (H) 12/03/2019 0814   BUN 15.5 02/07/2017 0836   CREATININE 0.90 12/03/2019 0814   CREATININE 0.9 02/07/2017 0836   CALCIUM 9.4 12/03/2019 0814   CALCIUM 9.5 02/07/2017 0836   PROT 6.9 12/03/2019 0814   PROT 7.0 02/07/2017 0836   ALBUMIN 3.9 12/03/2019 0814   ALBUMIN 3.8 02/07/2017 0836   AST 19 12/03/2019 0814   AST 25 02/07/2017 0836   ALT 19 12/03/2019 0814   ALT 36 02/07/2017 0836   ALKPHOS 46 12/03/2019 0814   ALKPHOS 55 02/07/2017 0836   BILITOT 0.4 12/03/2019 0814   BILITOT 0.31 02/07/2017 0836   GFRNONAA >60 12/03/2019 0814   GFRAA >60 12/03/2019 0814    No results found for: SPEP, UPEP  Lab Results  Component Value Date  WBC 5.5 12/03/2019   NEUTROABS 3.2 12/03/2019   HGB 13.6 12/03/2019   HCT 39.6 12/03/2019   MCV 90.6 12/03/2019   PLT 268 12/03/2019      Chemistry      Component Value Date/Time   NA 140 12/03/2019 0814   NA 138 02/07/2017 0836   K 4.1 12/03/2019 0814   K 4.2 02/07/2017 0836   CL 105 12/03/2019 0814   CO2 23 12/03/2019 0814   CO2 23 02/07/2017 0836   BUN 23 (H) 12/03/2019 0814   BUN 15.5 02/07/2017 0836   CREATININE 0.90 12/03/2019 0814   CREATININE 0.9 02/07/2017 0836      Component Value Date/Time   CALCIUM 9.4 12/03/2019 0814   CALCIUM 9.5 02/07/2017 0836   ALKPHOS 46 12/03/2019 0814   ALKPHOS 55 02/07/2017 0836   AST 19 12/03/2019 0814   AST 25 02/07/2017 0836   ALT 19 12/03/2019 0814   ALT 36 02/07/2017 0836   BILITOT 0.4 12/03/2019 0814   BILITOT 0.31 02/07/2017 0836

## 2019-12-26 MED FILL — SPRYCEL 50 MG TABLET: 50 | 30 days supply | Qty: 30 | Fill #6

## 2019-12-27 ENCOUNTER — Other Ambulatory Visit: Payer: Self-pay

## 2019-12-27 ENCOUNTER — Encounter: Payer: Self-pay | Admitting: Adult Health

## 2019-12-27 ENCOUNTER — Ambulatory Visit (INDEPENDENT_AMBULATORY_CARE_PROVIDER_SITE_OTHER)
Admission: RE | Admit: 2019-12-27 | Discharge: 2019-12-27 | Disposition: A | Discharge status: Home / Self Care | Payer: 59 | Source: Ambulatory Visit | Attending: Adult Health | Admitting: Adult Health

## 2019-12-27 ENCOUNTER — Ambulatory Visit (INDEPENDENT_AMBULATORY_CARE_PROVIDER_SITE_OTHER): Payer: 59 | Admitting: Adult Health

## 2019-12-27 VITALS — BP 110/68 | Temp 98.4°F | Wt 161.0 lb

## 2019-12-27 DIAGNOSIS — M545 Low back pain, unspecified: Secondary | ICD-10-CM

## 2019-12-27 IMAGING — DX DG LUMBAR SPINE COMPLETE 4+V
5 series · 5 of 5 positions shown · non-contrast
Comparison: None.

CLINICAL DATA: Low back pain following a fall 5 days ago.

EXAM:
LUMBAR SPINE - COMPLETE 4+ VIEW

[l-spine ap]
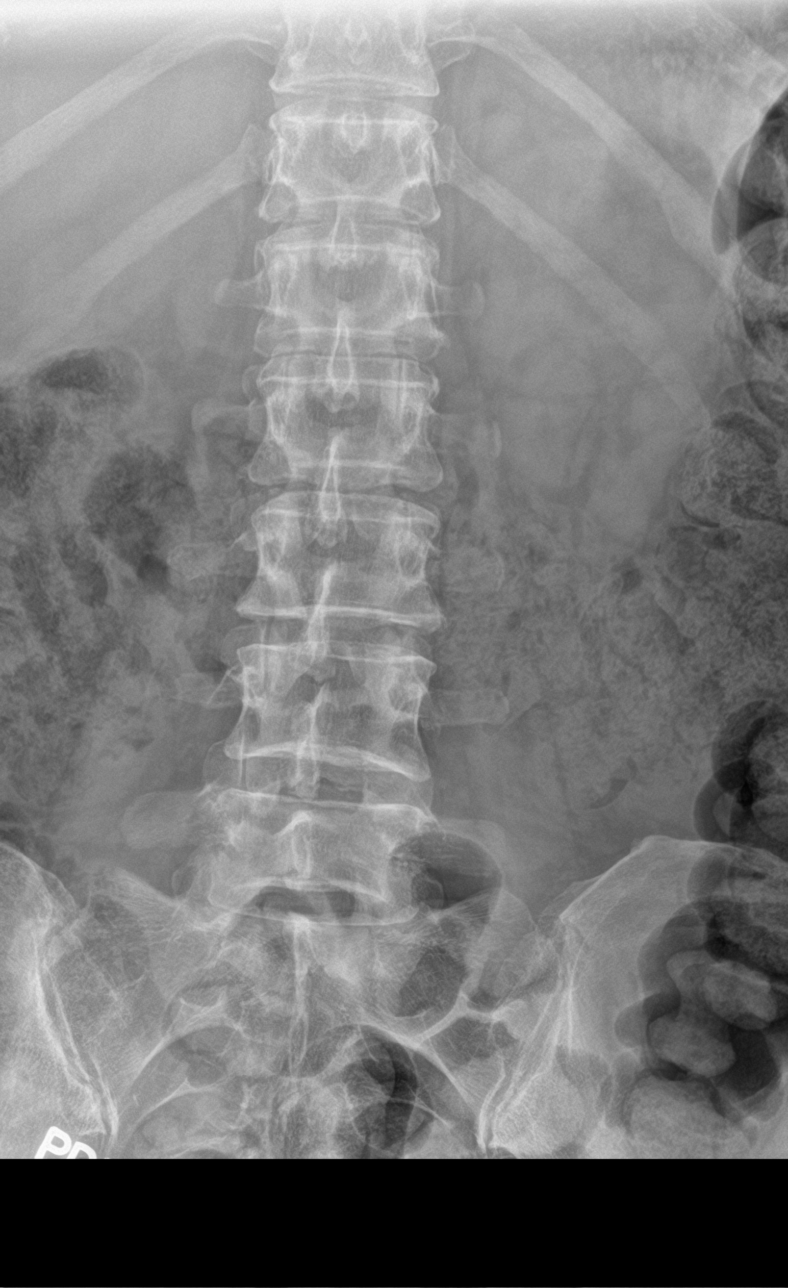

[l-spine obl (1 of 2)]
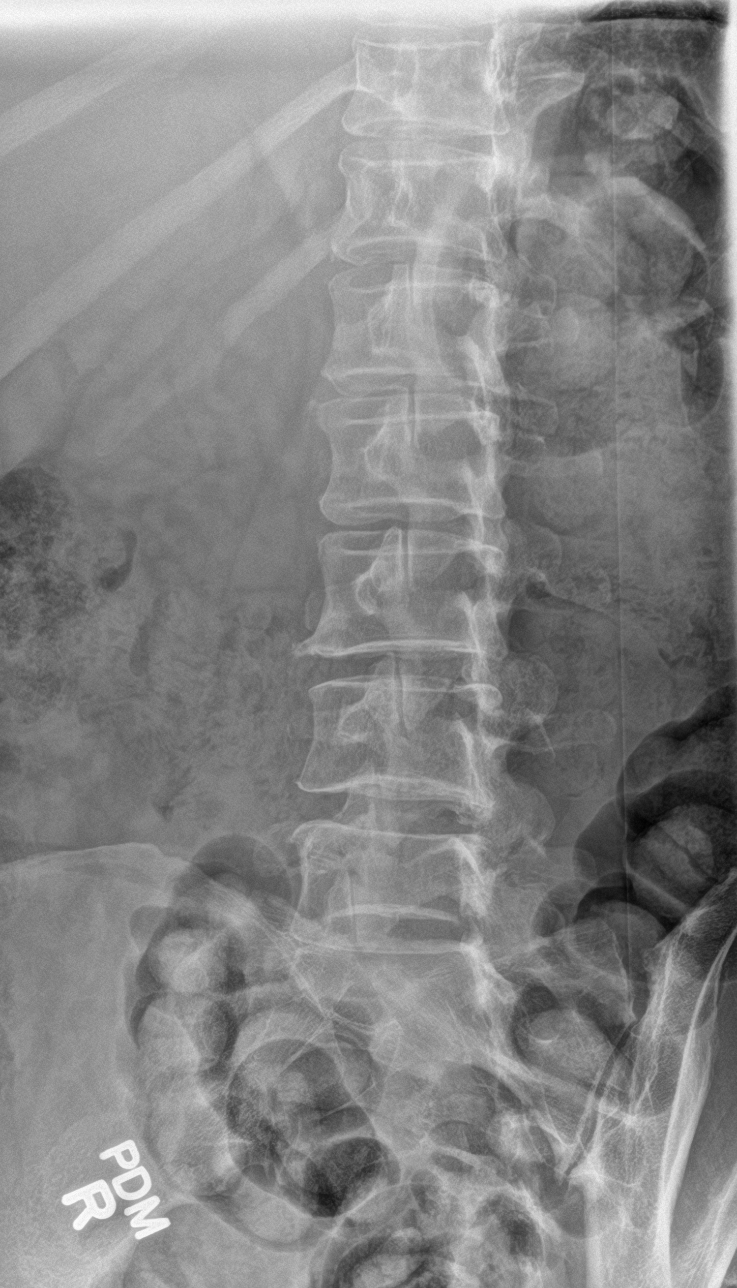

[l-spine obl (2 of 2)]
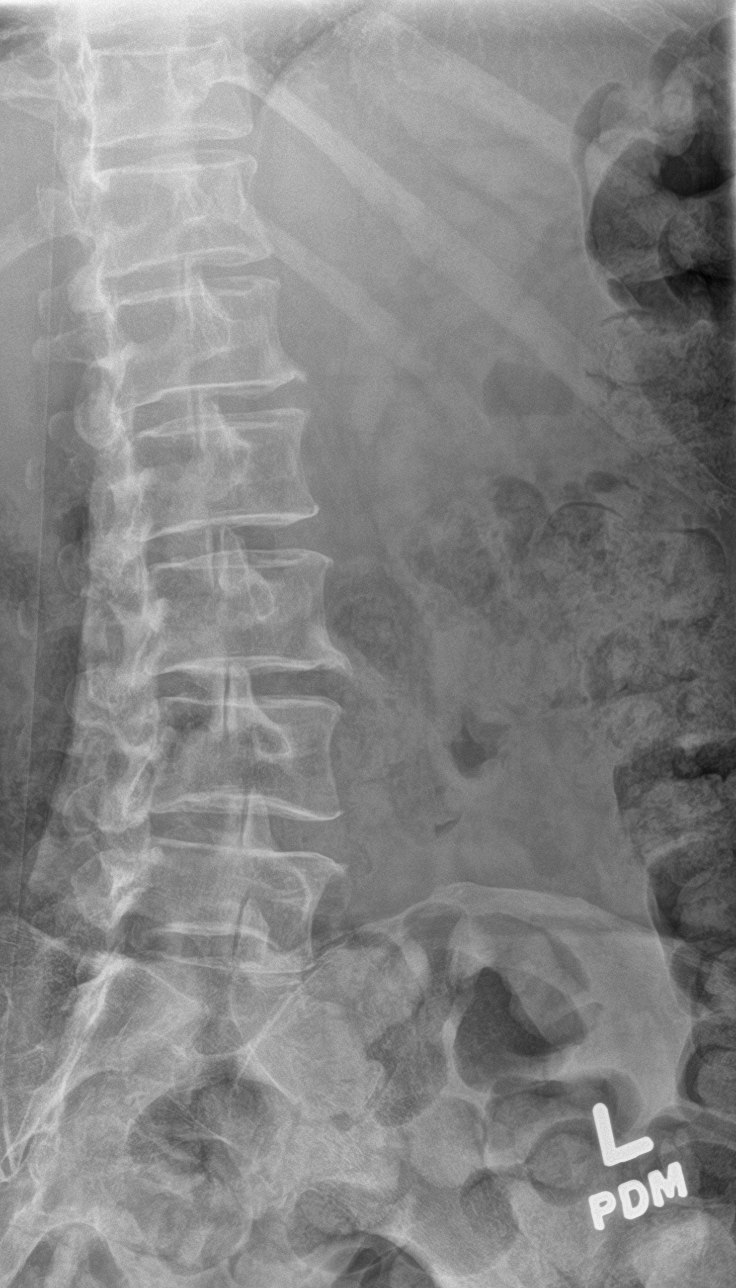

[l-spine lat]
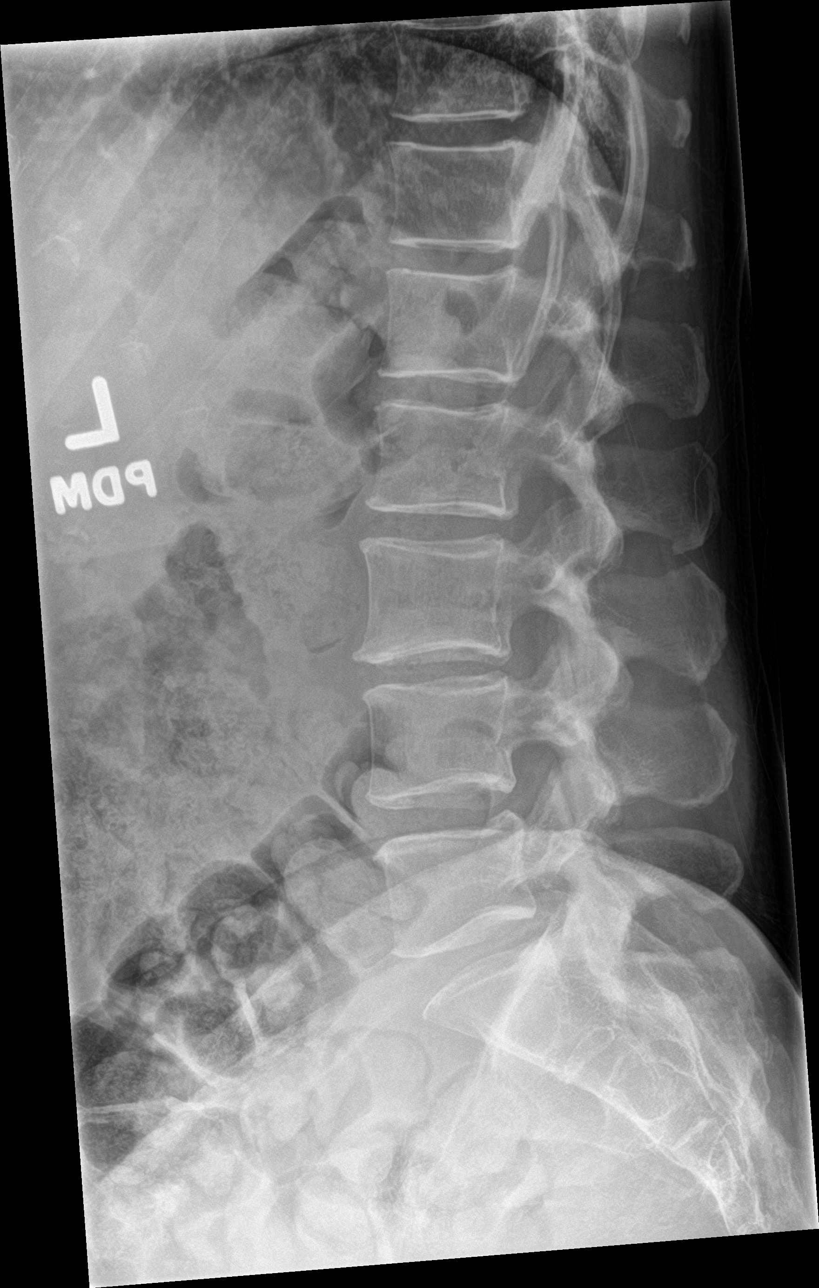

[l-spine spot]
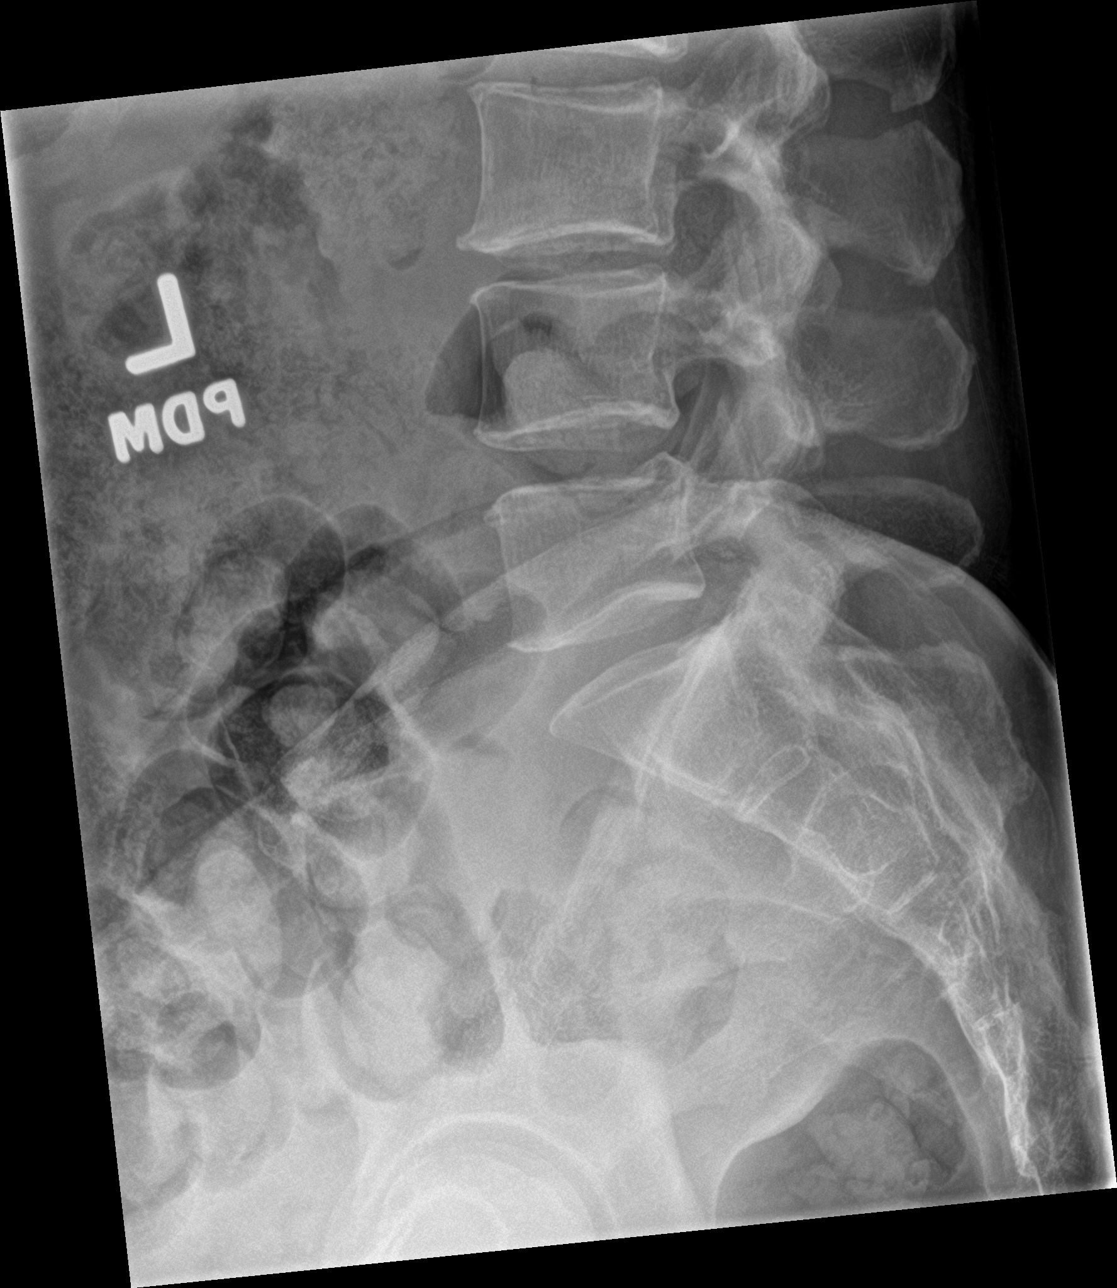

[5 of 5 positions shown; findings below may reference images not displayed]

FINDINGS: Mild multilevel degenerative changes. No fractures, pars defects or
subluxations.
IMPRESSION: No fracture or subluxation. Mild multilevel degenerative changes.

## 2019-12-27 NOTE — Progress Notes (Signed)
Subjective:    Patient ID: Christina [name], female    DOB: [birth year], 60 y.o.   MRN: [id number]  HPI 59 year old female who presents to the office today for an acute issue. She sustained a mechanical fall about a week ago while at home  falling onto her back and  buttocks. Since that time she has reported low back pain. Pain was getting better until yesterday when she had some increased pain. She has been taking Advil twice a day which helps with the pain.   Is able to bend at the waist but is slow in doing so.     Review of Systems See HPI   Past Medical History:  Diagnosis Date  . Allergic rhinitis   . Arthritis   . Cleft palate   . COVID-19 07/31/2019   tested positive, states mild aches/pains, fatigue  . Fibroid   . Grave's disease   . H/O insomnia   . H/O menorrhagia 2012  . History of chicken pox   . Hypertension   . Hyperthyroidism    s/p rai 8/06 now hypothyroid Dr. Suzette Battiest  . Infertility, female   . Leukemia ([place])   . Menorrhagia    Had Novasure  . 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 OSA (obstructive sleep apnea) 05/02/2019  . Polyp of cervix    Endometriel polyp. JO  . Sleep apnea    has cpap does not use  . Vitamin D deficiency     Social History   Socioeconomic History  . Marital status: Married    Spouse name: Not on file  . Number of children: Not on file  . Years of education: Not on file  . Highest education level: Not on file  Occupational History  . Not on file  Tobacco Use  . Smoking status: Never Smoker  . Smokeless tobacco: Never Used  Substance and Sexual Activity  . Alcohol use: Yes    Comment: socially  . Drug use: No  . Sexual activity: Yes  Other Topics Concern  . Not on file  Social History Narrative   Married   [place]   Audiologist working at [employer] Strain:   . Difficulty of Paying Living Expenses:   Food Insecurity:   . Worried About  Charity fundraiser in the Last Year:   . Arboriculturist in the Last Year:   Transportation Needs:   . Film/video editor (Medical):   Marland Kitchen Lack of Transportation (Non-Medical):   Physical Activity:   . Days of Exercise per Week:   . Minutes of Exercise per Session:   Stress:   . Feeling of Stress :   Social Connections:   . Frequency of Communication with Friends and Family:   . Frequency of Social Gatherings with Friends and Family:   . Attends Religious Services:   . Active Member of Clubs or Organizations:   . Attends Archivist Meetings:   Marland Kitchen Marital Status:   Intimate Partner Violence:   . Fear of Current or Ex-Partner:   . Emotionally Abused:   Marland Kitchen Physically Abused:   . Sexually Abused:     Past Surgical History:  Procedure Laterality Date  . BUNIONECTOMY  1986  . CRANIOTOMY N/A 06/17/2016   Procedure: RIGHT CRANIOTOMY HEMATOMA EVACUATION SUBDURAL;  Surgeon: Eustace Moore, MD;  Location: [facility];  Service: Neurosurgery;  Laterality: N/A;  . CRANIOTOMY Right 06/18/2016   Procedure: CRANIOTOMY HEMATOMA EVACUATION SUBDURAL RIGHT;  Surgeon: Eustace Moore, MD;  Location: [facility];  Service: Neurosurgery;  Laterality: Right;  . DILATION AND CURETTAGE OF UTERUS  09/15/2010  . HYSTEROSCOPY WITH D & C  09/28/2010  . MOUTH SURGERY    . NOVASURE ABLATION  09/28/2010  . REFRACTIVE SURGERY     for vision  . TONSILLECTOMY  1965    Family History  Problem Relation Age of Onset  . Diabetes Mother   . Cancer Mother 31       ovarian ca  . Hypertension Other   . Prostate cancer Other        grandfather  . Hypertension Father   . Cancer Maternal Uncle        leukemia  . Colon cancer Neg Hx   . Esophageal cancer Neg Hx   . Rectal cancer Neg Hx   . Stomach cancer Neg Hx   . Colon polyps Neg Hx     No Known Allergies  Current Outpatient Medications on File Prior to Visit  Medication Sig Dispense Refill  . dasatinib (SPRYCEL) 50 MG tablet TAKE 1 TABLET (50 MG TOTAL) BY  MOUTH DAILY. 90 tablet 3  . fluocinonide cream (LIDEX) [id number] % Apply 1 application topically 2 (two) times daily. For eczema 30 g 1  . losartan (COZAAR) 50 MG tablet TAKE 1 TABLET BY MOUTH DAILY. 90 tablet 1  . SYNTHROID 175 MCG tablet Take 1 tablet (175 mcg total) by mouth daily before breakfast. 90 tablet 2  . verapamil (CALAN-SR) 240 MG CR tablet TAKE 1 TABLET BY MOUTH AT BEDTIME. 90 tablet 1  . zolpidem (AMBIEN) 10 MG tablet TAKE 1 TABLET BY MOUTH AT BEDTIME AS NEEDED FOR SLEEP 90 tablet 0   No current facility-administered medications on file prior to visit.    BP 110/68   Temp 98.4 F (36.9 C)   Wt 161 lb (73 kg)   BMI 25.22 kg/m       Objective:   Physical Exam Vitals and nursing note reviewed.  Constitutional:      Appearance: Normal appearance.  Musculoskeletal:        General: No swelling, tenderness or deformity. Normal range of motion.  Skin:    General: Skin is warm and dry.     Capillary Refill: Capillary refill takes less than 2 seconds.  Neurological:     General: No focal deficit present.     Mental Status: She is alert and oriented to person, place, and time.       Assessment & Plan:  1. Acute bilateral low back pain without sciatica - Xray showed  No fracture or subluxation. Mild multilevel degenerative changes. - DG Lumbar Spine Complete; Future - likely bruise.  - Advised conservative measures   Dorothyann Peng, NP

## 2020-01-07 MED FILL — VERAPAMIL ER 240 MG TABLET: 240 | 90 days supply | Qty: 90 | Fill #1

## 2020-01-16 LAB — BCR/ABL

## 2020-01-29 ENCOUNTER — Telehealth: Payer: Self-pay

## 2020-01-29 NOTE — Telephone Encounter (Signed)
Oral Oncology Patient Advocate Encounter  Received notification from [place] that prior authorization for Sprycel is required.  PA submitted on CoverMyMeds Key [id number] Status is pending  Oral Oncology Clinic will continue to follow.  [place] Patient [place] Phone [phone] Fax [phone] 01/29/2020 12:43 PM

## 2020-01-30 MED FILL — SPRYCEL 50 MG TABLET: 50 | 30 days supply | Qty: 30 | Fill #7

## 2020-01-30 NOTE — Telephone Encounter (Signed)
Oral Oncology Patient Advocate Encounter  Prior Authorization for Sprycel has been approved.    PA# [id number] Effective dates: 01/30/20 through 01/28/21  Patients co-pay is $500  Oral Oncology Clinic will continue to follow.   [place] Patient [place] Phone [phone] Fax [phone] 01/30/2020 10:55 AM

## 2020-02-03 MED FILL — SYNTHROID 175 MCG TABLET: 175 | 90 days supply | Qty: 90 | Fill #1

## 2020-02-24 MED FILL — SPRYCEL 50 MG TABLET: 50 | 30 days supply | Qty: 30 | Fill #8

## 2020-03-03 ENCOUNTER — Inpatient Hospital Stay: Payer: 59 | Attending: Hematology and Oncology

## 2020-03-03 ENCOUNTER — Other Ambulatory Visit: Payer: Self-pay

## 2020-03-03 DIAGNOSIS — C9210 Chronic myeloid leukemia, BCR/ABL-positive, not having achieved remission: Secondary | ICD-10-CM | POA: Diagnosis not present

## 2020-03-03 LAB — COMPREHENSIVE METABOLIC PANEL
ALT: 17 U/L (ref 0–44)
AST: 17 U/L (ref 15–41)
Albumin: 3.7 g/dL (ref 3.5–5.0)
Alkaline Phosphatase: 43 U/L (ref 38–126)
Anion gap: 8 (ref 5–15)
BUN: 21 mg/dL — ABNORMAL HIGH (ref 6–20)
CO2: 24 mmol/L (ref 22–32)
Calcium: 9.8 mg/dL (ref 8.9–10.3)
Chloride: 106 mmol/L (ref 98–111)
Creatinine, Ser: 0.85 mg/dL (ref 0.44–1.00)
GFR calc Af Amer: >60 mL/min (ref >60)
GFR calc non Af Amer: >60 mL/min (ref >60)
Glucose, Bld: 81 mg/dL (ref 70–99)
Potassium: 4.2 mmol/L (ref 3.5–5.1)
Sodium: 138 mmol/L (ref 135–145)
Total Bilirubin: 0.4 mg/dL (ref 0.3–1.2)
Total Protein: 6.7 g/dL (ref 6.5–8.1)

## 2020-03-03 LAB — CBC WITH DIFFERENTIAL/PLATELET
Abs Immature Granulocytes: 0.01 10*3/uL (ref 0.00–0.07)
Basophils Absolute: 0.1 10*3/uL (ref 0.0–0.1)
Basophils Relative: 1 %
Eosinophils Absolute: 0.3 10*3/uL (ref 0.0–0.5)
Eosinophils Relative: 6 %
HCT: 37.9 % (ref 36.0–46.0)
Hemoglobin: 12.7 g/dL (ref 12.0–15.0)
Immature Granulocytes: 0 %
Lymphocytes Relative: 29 %
Lymphs Abs: 1.5 10*3/uL (ref 0.7–4.0)
MCH: 30.9 pg (ref 26.0–34.0)
MCHC: 33.5 g/dL (ref 30.0–36.0)
MCV: 92.2 fL (ref 80.0–100.0)
Monocytes Absolute: 0.4 10*3/uL (ref 0.1–1.0)
Monocytes Relative: 8 %
Neutro Abs: 2.9 10*3/uL (ref 1.7–7.7)
Neutrophils Relative %: 56 %
Platelets: 241 10*3/uL (ref 150–400)
RBC: 4.11 MIL/uL (ref 3.87–5.11)
RDW: 13.2 % (ref 11.5–15.5)
WBC: 5.3 10*3/uL (ref 4.0–10.5)
nRBC: 0 % (ref 0.0–0.2)

## 2020-03-06 ENCOUNTER — Other Ambulatory Visit: Payer: Self-pay | Admitting: Internal Medicine

## 2020-03-06 MED FILL — ZOLPIDEM TARTRATE 10 MG TAB: 10 | 90 days supply | Qty: 90 | Fill #0

## 2020-03-09 ENCOUNTER — Other Ambulatory Visit: Payer: Self-pay

## 2020-03-09 NOTE — Telephone Encounter (Signed)
Not sure why they are sending this again as it was filled on 03/06/2020.

## 2020-03-10 LAB — BCR/ABL

## 2020-03-13 ENCOUNTER — Encounter: Payer: Self-pay | Admitting: Hematology and Oncology

## 2020-03-13 ENCOUNTER — Telehealth: Payer: Self-pay | Admitting: Hematology and Oncology

## 2020-03-13 ENCOUNTER — Inpatient Hospital Stay: Payer: 59 | Attending: Hematology and Oncology | Admitting: Hematology and Oncology

## 2020-03-13 ENCOUNTER — Other Ambulatory Visit: Payer: Self-pay

## 2020-03-13 DIAGNOSIS — C9210 Chronic myeloid leukemia, BCR/ABL-positive, not having achieved remission: Secondary | ICD-10-CM | POA: Insufficient documentation

## 2020-03-13 DIAGNOSIS — Z79899 Other long term (current) drug therapy: Secondary | ICD-10-CM | POA: Insufficient documentation

## 2020-03-13 DIAGNOSIS — Z9221 Personal history of antineoplastic chemotherapy: Secondary | ICD-10-CM | POA: Insufficient documentation

## 2020-03-13 NOTE — Progress Notes (Signed)
[facility] OFFICE PROGRESS NOTE  Patient Care Team: Panosh, Standley Brooking, MD as PCP - General [name], MD (Endocrinology) [name], MD (Internal Medicine) Everett Graff, MD as Attending Physician (Obstetrics and Gynecology) Heath Lark, MD as Consulting Physician (Hematology and Oncology) Chesley Mires, MD as Consulting Physician (Pulmonary Disease)  ASSESSMENT & PLAN:  CML (chronic myeloid leukemia) ([place]) The patient has achieved major molecular response without detectable BCR/ABL transcripts She is comfortable to continue on reduced dose Sprycel 50 mg daily She tolerated treatment very well with no major side effects She will continue the same treatment without dose adjustment.   I will continue to see her every 3 months with blood work to be done ahead of time The patient is educated to watch for signs and symptoms of shortness of breath/fluid retention   No orders of the defined types were placed in this encounter.   All questions were answered. The patient knows to call the clinic with any problems, questions or concerns. The total time spent in the appointment was 15 minutes encounter with patients including review of chart and various tests results, discussions about plan of care and coordination of care plan   Heath Lark, MD 03/13/2020 1:25 PM  INTERVAL HISTORY: Please see below for problem oriented charting. She returns for further follow-up She is doing very well She missed 4 pills recently due to delay in getting medication refill but now has signed up for automated refill for her Sprycel No recent infection, fever or chills No shortness of breath or cough  SUMMARY OF ONCOLOGIC HISTORY: Oncology History  CML (chronic myeloid leukemia) ([place])  05/15/2015 Bone Marrow Biopsy   BM biopsy and FISH confirmed CML [id number]   05/15/2015 - 05/20/2015 Chemotherapy   She started taking Hydrea 1500 mg daily   05/15/2015 Tumor Marker   BCR/ABL b2a2  87.5%, IS 49%   05/21/2015 -  Chemotherapy   She started taking Sprycel 100 mg daily   06/29/2015 Adverse Reaction   She had diarrhea. Dose of Srycel is reduced to 50 mg daily   09/10/2015 Tumor Marker   BCR/ABL b2a2 0.21%, IS 0.1743%   12/11/2015 Pathology Results   BCR/ABL b2a2 0.19%, IS 0.1577%   04/13/2016 Pathology Results   BCR/ABL b2a2 0.29%, IS 0.2407%   07/14/2016 Pathology Results   BCR/ABL b2a2 0.004%, IS 0.0092%. She is in MMR   07/21/2016 Miscellaneous   Dose of Sprycel is reduced to 50 mg daily   09/08/2016 Pathology Results   BCR/ABL b2a2 0.001%, IS 0.0023%. She is in MMR   06/14/2017 Pathology Results   BCR/ABL not detected. She is in MMR   10/11/2017 Pathology Results   BCR/ABL is not detected. She is in MMR   02/14/2018 Pathology Results   BCR/ABL is not detected. She is in MMR   05/17/2018 Pathology Results   BCR/ABL is not detected. She is in MMR   08/22/2018 Pathology Results   BCR/ABL is not detected. She is in MMR   11/30/2018 Pathology Results   BCR/ABL is not detected. She is in MMR   06/03/2019 Pathology Results   BCR/ABL is not detected. She is in MMR   09/03/2019 Pathology Results   BCR/ABL is not detected. She is in MMR   12/05/2019 Pathology Results   BCR/ABL is not detected. She is in MMR     REVIEW OF SYSTEMS:   Constitutional: Denies fevers, chills or abnormal weight loss Eyes: Denies blurriness of vision Ears, nose, mouth, throat, and  face: Denies mucositis or sore throat Respiratory: Denies cough, dyspnea or wheezes Cardiovascular: Denies palpitation, chest discomfort or lower extremity swelling Gastrointestinal:  Denies nausea, heartburn or change in bowel habits Skin: Denies abnormal skin rashes Lymphatics: Denies new lymphadenopathy or easy bruising Neurological:Denies numbness, tingling or new weaknesses Behavioral/Psych: Mood is stable, no new changes  All other systems were reviewed with the patient and are negative.  I have  reviewed the past medical history, past surgical history, social history and family history with the patient and they are unchanged from previous note.  ALLERGIES:  has No Known Allergies.  MEDICATIONS:  Current Outpatient Medications  Medication Sig Dispense Refill  . dasatinib (SPRYCEL) 50 MG tablet TAKE 1 TABLET (50 MG TOTAL) BY MOUTH DAILY. 90 tablet 3  . fluocinonide cream (LIDEX) 5.02 % Apply 1 application topically 2 (two) times daily. For eczema 30 g 1  . losartan (COZAAR) 50 MG tablet TAKE 1 TABLET BY MOUTH DAILY. 90 tablet 1  . SYNTHROID 175 MCG tablet Take 1 tablet (175 mcg total) by mouth daily before breakfast. 90 tablet 2  . verapamil (CALAN-SR) 240 MG CR tablet TAKE 1 TABLET BY MOUTH AT BEDTIME. 90 tablet 1  . zolpidem (AMBIEN) 10 MG tablet TAKE 1 TABLET BY MOUTH AT BEDTIME AS NEEDED FOR SLEEP 90 tablet 0   No current facility-administered medications for this visit.    PHYSICAL EXAMINATION: ECOG PERFORMANCE STATUS: 0 - Asymptomatic  Vitals:   03/13/20 0758  BP: 123/73  Pulse: 66  Resp: 18  Temp: 98.7 F (37.1 C)  SpO2: 98%   There were no vitals filed for this visit.  GENERAL:alert, no distress and comfortable SKIN: skin color, texture, turgor are normal, no rashes or significant lesions EYES: normal, Conjunctiva are pink and non-injected, sclera clear OROPHARYNX:no exudate, no erythema and lips, buccal mucosa, and tongue normal  NECK: supple, thyroid normal size, non-tender, without nodularity LYMPH:  no palpable lymphadenopathy in the cervical, axillary or inguinal LUNGS: clear to auscultation and percussion with normal breathing effort HEART: regular rate & rhythm and no murmurs and no lower extremity edema ABDOMEN:abdomen soft, non-tender and normal bowel sounds Musculoskeletal:no cyanosis of digits and no clubbing  NEURO: alert & oriented x 3 with fluent speech, no focal motor/sensory deficits  LABORATORY DATA:  I have reviewed the data as listed     Component Value Date/Time   NA 138 03/03/2020 0747   NA 138 02/07/2017 0836   K 4.2 03/03/2020 0747   K 4.2 02/07/2017 0836   CL 106 03/03/2020 0747   CO2 24 03/03/2020 0747   CO2 23 02/07/2017 0836   GLUCOSE 81 03/03/2020 0747   GLUCOSE 85 02/07/2017 0836   BUN 21 (H) 03/03/2020 0747   BUN 15.5 02/07/2017 0836   CREATININE 0.85 03/03/2020 0747   CREATININE 0.9 02/07/2017 0836   CALCIUM 9.8 03/03/2020 0747   CALCIUM 9.5 02/07/2017 0836   PROT 6.7 03/03/2020 0747   PROT 7.0 02/07/2017 0836   ALBUMIN 3.7 03/03/2020 0747   ALBUMIN 3.8 02/07/2017 0836   AST 17 03/03/2020 0747   AST 25 02/07/2017 0836   ALT 17 03/03/2020 0747   ALT 36 02/07/2017 0836   ALKPHOS 43 03/03/2020 0747   ALKPHOS 55 02/07/2017 0836   BILITOT 0.4 03/03/2020 0747   BILITOT 0.31 02/07/2017 0836   GFRNONAA >60 03/03/2020 0747   GFRAA >60 03/03/2020 0747    No results found for: SPEP, UPEP  Lab Results  Component Value Date  WBC 5.3 03/03/2020   NEUTROABS 2.9 03/03/2020   HGB 12.7 03/03/2020   HCT 37.9 03/03/2020   MCV 92.2 03/03/2020   PLT 241 03/03/2020      Chemistry      Component Value Date/Time   NA 138 03/03/2020 0747   NA 138 02/07/2017 0836   K 4.2 03/03/2020 0747   K 4.2 02/07/2017 0836   CL 106 03/03/2020 0747   CO2 24 03/03/2020 0747   CO2 23 02/07/2017 0836   BUN 21 (H) 03/03/2020 0747   BUN 15.5 02/07/2017 0836   CREATININE 0.85 03/03/2020 0747   CREATININE 0.9 02/07/2017 0836      Component Value Date/Time   CALCIUM 9.8 03/03/2020 0747   CALCIUM 9.5 02/07/2017 0836   ALKPHOS 43 03/03/2020 0747   ALKPHOS 55 02/07/2017 0836   AST 17 03/03/2020 0747   AST 25 02/07/2017 0836   ALT 17 03/03/2020 0747   ALT 36 02/07/2017 0836   BILITOT 0.4 03/03/2020 0747   BILITOT 0.31 02/07/2017 0836

## 2020-03-13 NOTE — Telephone Encounter (Signed)
Scheduled per 8/6 sch message. Pt requested Wednesday for f/u. No avs or calendar needed to be printed.

## 2020-03-13 NOTE — Assessment & Plan Note (Signed)
The patient has achieved major molecular response without detectable BCR/ABL transcripts She is comfortable to continue on reduced dose Sprycel 50 mg daily She tolerated treatment very well with no major side effects She will continue the same treatment without dose adjustment.   I will continue to see her every 3 months with blood work to be done ahead of time The patient is educated to watch for signs and symptoms of shortness of breath/fluid retention

## 2020-03-25 MED FILL — SPRYCEL 50 MG TABLET: 50 | 30 days supply | Qty: 30 | Fill #9

## 2020-03-26 DIAGNOSIS — M67912 Unspecified disorder of synovium and tendon, left shoulder: Secondary | ICD-10-CM | POA: Diagnosis not present

## 2020-04-06 ENCOUNTER — Other Ambulatory Visit: Payer: Self-pay | Admitting: Internal Medicine

## 2020-04-06 MED FILL — VERAPAMIL ER 240 MG TABLET: 240 | 90 days supply | Qty: 90 | Fill #0

## 2020-04-20 ENCOUNTER — Other Ambulatory Visit: Payer: Self-pay | Admitting: Internal Medicine

## 2020-04-20 MED FILL — LOSARTAN POTASSIUM 50 MG TA: 50 | 90 days supply | Qty: 90 | Fill #0

## 2020-04-21 MED FILL — SPRYCEL 50 MG TABLET: 50 | 30 days supply | Qty: 30 | Fill #10

## 2020-05-05 MED FILL — SYNTHROID 175 MCG TABLET: 175 | 90 days supply | Qty: 90 | Fill #2

## 2020-05-18 MED FILL — SPRYCEL 50 MG TABLET: 50 | 30 days supply | Qty: 30 | Fill #11

## 2020-05-26 DIAGNOSIS — M25562 Pain in left knee: Secondary | ICD-10-CM | POA: Diagnosis not present

## 2020-05-26 DIAGNOSIS — M1712 Unilateral primary osteoarthritis, left knee: Secondary | ICD-10-CM | POA: Diagnosis not present

## 2020-05-26 DIAGNOSIS — M25512 Pain in left shoulder: Secondary | ICD-10-CM | POA: Diagnosis not present

## 2020-06-01 ENCOUNTER — Inpatient Hospital Stay: Payer: 59 | Attending: Hematology and Oncology

## 2020-06-01 DIAGNOSIS — C9210 Chronic myeloid leukemia, BCR/ABL-positive, not having achieved remission: Secondary | ICD-10-CM | POA: Insufficient documentation

## 2020-06-01 LAB — COMPREHENSIVE METABOLIC PANEL
ALT: 17 U/L (ref 0–44)
AST: 16 U/L (ref 15–41)
Albumin: 3.8 g/dL (ref 3.5–5.0)
Alkaline Phosphatase: 43 U/L (ref 38–126)
Anion gap: 7 (ref 5–15)
BUN: 24 mg/dL — ABNORMAL HIGH (ref 6–20)
CO2: 23 mmol/L (ref 22–32)
Calcium: 9.4 mg/dL (ref 8.9–10.3)
Chloride: 108 mmol/L (ref 98–111)
Creatinine, Ser: 0.81 mg/dL (ref 0.44–1.00)
GFR, Estimated: >60 mL/min (ref >60)
Glucose, Bld: 98 mg/dL (ref 70–99)
Potassium: 4.3 mmol/L (ref 3.5–5.1)
Sodium: 138 mmol/L (ref 135–145)
Total Bilirubin: 0.3 mg/dL (ref 0.3–1.2)
Total Protein: 6.5 g/dL (ref 6.5–8.1)

## 2020-06-01 LAB — CBC WITH DIFFERENTIAL/PLATELET
Abs Immature Granulocytes: 0.02 10*3/uL (ref 0.00–0.07)
Basophils Absolute: 0.1 10*3/uL (ref 0.0–0.1)
Basophils Relative: 2 %
Eosinophils Absolute: 0.4 10*3/uL (ref 0.0–0.5)
Eosinophils Relative: 8 %
HCT: 37.8 % (ref 36.0–46.0)
Hemoglobin: 12.9 g/dL (ref 12.0–15.0)
Immature Granulocytes: 0 %
Lymphocytes Relative: 33 %
Lymphs Abs: 1.8 10*3/uL (ref 0.7–4.0)
MCH: 31.5 pg (ref 26.0–34.0)
MCHC: 34.1 g/dL (ref 30.0–36.0)
MCV: 92.2 fL (ref 80.0–100.0)
Monocytes Absolute: 0.6 10*3/uL (ref 0.1–1.0)
Monocytes Relative: 11 %
Neutro Abs: 2.5 10*3/uL (ref 1.7–7.7)
Neutrophils Relative %: 46 %
Platelets: 277 10*3/uL (ref 150–400)
RBC: 4.1 MIL/uL (ref 3.87–5.11)
RDW: 12.9 % (ref 11.5–15.5)
WBC: 5.5 10*3/uL (ref 4.0–10.5)
nRBC: 0 % (ref 0.0–0.2)

## 2020-06-02 NOTE — Progress Notes (Signed)
Chief Complaint  Patient presents with  . Follow-up  . Medication Management  . Hypothyroidism    HPI: Christina [name] 59 y.o. come in for Chronic disease management  And med check  Thyroid: higher dose  Last check in spring cml :remission on meds gets labs every 3 mos.  Healthy eating  No gi gu sx.  ambien :most nights but trying to not use much . Had a fall on left shoulder moving  Stuff  Still hurts under ortho care may end up with mri  Aleve helps  Needs flu vaccine HT: doing well.  ROS: See pertinent positives and negatives per HPI.  Past Medical History:  Diagnosis Date  . Allergic rhinitis   . Arthritis   . Cleft palate   . COVID-19 07/31/2019   tested positive, states mild aches/pains, fatigue  . Diarrhea due to drug 06/30/2015  . Fibroid   . Grave's disease   . H/O insomnia   . H/O menorrhagia 2012  . History of chicken pox   . Hypertension   . Hyperthyroidism    s/p rai 8/06 now hypothyroid Dr. Suzette Battiest  . Infertility, female   . Leukemia ([place])   . Menorrhagia    Had Novasure  . 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 OSA (obstructive sleep apnea) 05/02/2019  . Polyp of cervix    Endometriel polyp. JO  . Sleep apnea    has cpap does not use  . Vitamin D deficiency     Family History  Problem Relation Age of Onset  . Diabetes Mother   . Cancer Mother 69       ovarian ca  . Hypertension Other   . Prostate cancer Other        grandfather  . Hypertension Father   . Cancer Maternal Uncle        leukemia  . Colon cancer Neg Hx   . Esophageal cancer Neg Hx   . Rectal cancer Neg Hx   . Stomach cancer Neg Hx   . Colon polyps Neg Hx     Social History   Socioeconomic History  . Marital status: Married    Spouse name: Not on file  . Number of children: Not on file  . Years of education: Not on file  . Highest education level: Not on file  Occupational History  . Not on file  Tobacco Use  . Smoking status: Never Smoker  . Smokeless  tobacco: Never Used  Substance and Sexual Activity  . Alcohol use: Yes    Comment: socially  . Drug use: No  . Sexual activity: Yes  Other Topics Concern  . 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 Difficulty of Paying Living Expenses: Not on file  Food Insecurity:   . Worried About Charity fundraiser in the Last Year: Not on file  . Ran Out of Food in the Last Year: Not on file  Transportation Needs:   . Lack of Transportation (Medical): Not on file  . Lack of Transportation (Non-Medical): Not on file  Physical Activity:   . Days of Exercise per Week: Not on file  . Minutes of Exercise per Session: Not on file  Stress:   . Feeling of Stress : Not on file  Social Connections:   .  Frequency of Communication with Friends and Family: Not on file  . Frequency of Social Gatherings with Friends and Family: Not on file  . Attends Religious Services: Not on file  . Active Member of Clubs or Organizations: Not on file  . Attends Archivist Meetings: Not on file  . Marital Status: Not on file    Outpatient Medications Prior to Visit  Medication Sig Dispense Refill  . dasatinib (SPRYCEL) 50 MG tablet TAKE 1 TABLET (50 MG TOTAL) BY MOUTH DAILY. 90 tablet 3  . losartan (COZAAR) 50 MG tablet TAKE 1 TABLET BY MOUTH DAILY. 90 tablet 0  . SYNTHROID 175 MCG tablet Take 1 tablet (175 mcg total) by mouth daily before breakfast. 90 tablet 2  . verapamil (CALAN-SR) 240 MG CR tablet TAKE 1 TABLET BY MOUTH AT BEDTIME. 90 tablet 0  . zolpidem (AMBIEN) 10 MG tablet TAKE 1 TABLET BY MOUTH AT BEDTIME AS NEEDED FOR SLEEP 90 tablet 0  . fluocinonide cream (LIDEX) 5.95 % Apply 1 application topically 2 (two) times daily. For eczema (Patient not taking: Reported on 06/03/2020) 30 g 1   No facility-administered medications prior to visit.     EXAM:  BP 126/78  (BP Location: Left Arm, Patient Position: Sitting, Cuff Size: Normal)   Pulse 62   Temp 97.7 F (36.5 C) (Oral)   Ht 5\' 8"  (1.727 m)   Wt 160 lb (72.6 kg)   SpO2 98%   BMI 24.33 kg/m   Body mass index is 24.33 kg/m. Wt Readings from Last 3 Encounters:  06/03/20 160 lb (72.6 kg)  12/27/19 161 lb (73 kg)  12/12/19 165 lb 6.4 oz (75 kg)    GENERAL: vitals reviewed and listed above, alert, oriented, appears well hydrated and in no acute distress HEENT: atraumatic, conjunctiva  clear, no obvious abnormalities on inspection of external nose and ears OP : masked  NECK: no obvious masses on inspection palpation  LUNGS: clear to auscultation bilaterally, no wheezes, rales or rhonchi, good air movement CV: HRRR, no clubbing cyanosis or  peripheral edema nl cap refill  Abdomen:  Sof,t normal bowel sounds without hepatosplenomegaly, no guarding rebound or masses no CVA tenderness Skin dryness  No rash  MS: moves all extremities without noticeable focal  Abnormality left shoulder  Dec rom  Pain  PSYCH: pleasant and cooperative disc   Loss of child grief and  Emotional appropriate insight No obv neuro  focal defects   Lab Results  Component Value Date   WBC 5.5 06/01/2020   HGB 12.9 06/01/2020   HCT 37.8 06/01/2020   PLT 277 06/01/2020   GLUCOSE 98 06/01/2020   CHOL 199 10/29/2019   TRIG 113.0 10/29/2019   HDL 53.50 10/29/2019   LDLDIRECT 162.0 04/24/2019   LDLCALC 123 (H) 10/29/2019   ALT 17 06/01/2020   AST 16 06/01/2020   NA 138 06/01/2020   K 4.3 06/01/2020   CL 108 06/01/2020   CREATININE 0.81 06/01/2020   BUN 24 (H) 06/01/2020   CO2 23 06/01/2020   TSH 7.81 (H) 10/29/2019   INR 0.90 06/17/2016   HGBA1C 5.6 04/24/2019   BP Readings from Last 3 Encounters:  06/03/20 126/78  03/13/20 123/73  12/27/19 110/68    ASSESSMENT AND PLAN:  Discussed the following assessment and plan:  Hypothyroidism, unspecified type - Plan: TSH, T4, free, T4, free, TSH  Medication  management - Plan: TSH, T4, free, T4, free, TSH  Essential hypertension - controlled  - Plan: TSH,  T4, free, T4, free, TSH  Need for influenza vaccination - Plan: Flu Vaccine QUAD 6+ mos PF IM (Fluarix Quad PF)  Left shoulder pain, unspecified chronicity - from trauma fall in July   History of COVID-19   Bereavement    Disc  support  Healthy  Grieving  Update thyroid labs  Fu with shoulder for ortho Disc balance exercises  With distraction     No noted underlying path noted   -Patient advised to return or notify health care team  if  new concerns arise.  Patient Instructions  1. Advise balance exercises   caution with ambien .  Thyroid testing today .    [facility]org    There is a local chapter but also on line and good resources for help through    Https://www.[facility].org/#  There may be  Support groups for parents   That may be helpful .   Plan visit every 6-12 months   Kalob Bergen K. Enzley Kitchens M.D.

## 2020-06-03 ENCOUNTER — Ambulatory Visit (INDEPENDENT_AMBULATORY_CARE_PROVIDER_SITE_OTHER): Payer: 59 | Admitting: Internal Medicine

## 2020-06-03 ENCOUNTER — Encounter: Payer: Self-pay | Admitting: Internal Medicine

## 2020-06-03 ENCOUNTER — Other Ambulatory Visit: Payer: Self-pay

## 2020-06-03 VITALS — BP 126/78 | HR 62 | Temp 97.7°F | Ht 68.0 in | Wt 160.0 lb

## 2020-06-03 DIAGNOSIS — I10 Essential (primary) hypertension: Secondary | ICD-10-CM | POA: Diagnosis not present

## 2020-06-03 DIAGNOSIS — M25512 Pain in left shoulder: Secondary | ICD-10-CM | POA: Diagnosis not present

## 2020-06-03 DIAGNOSIS — E039 Hypothyroidism, unspecified: Secondary | ICD-10-CM | POA: Diagnosis not present

## 2020-06-03 DIAGNOSIS — Z79899 Other long term (current) drug therapy: Secondary | ICD-10-CM | POA: Diagnosis not present

## 2020-06-03 DIAGNOSIS — Z23 Encounter for immunization: Secondary | ICD-10-CM | POA: Diagnosis not present

## 2020-06-03 DIAGNOSIS — Z8616 Personal history of COVID-19: Secondary | ICD-10-CM

## 2020-06-03 LAB — TSH: TSH: 3.57 mIU/L (ref 0.40–4.50)

## 2020-06-03 LAB — T4, FREE: Free T4: 1.4 ng/dL (ref 0.8–1.8)

## 2020-06-03 NOTE — Patient Instructions (Addendum)
1. Advise balance exercises   caution with ambien .  Thyroid testing today .    [facility]nz[facility]org    There is a local chapter but also on line and good resources for help through    Https://www.[facility].org/#  There may be  Support groups for parents   That may be helpful .   Plan visit every 6-12 months

## 2020-06-04 NOTE — Progress Notes (Signed)
Thyroid  level now in range    continue same dosing

## 2020-06-05 LAB — BCR/ABL

## 2020-06-06 ENCOUNTER — Other Ambulatory Visit: Payer: Self-pay | Admitting: Internal Medicine

## 2020-06-08 ENCOUNTER — Other Ambulatory Visit: Payer: Self-pay | Admitting: Internal Medicine

## 2020-06-08 MED FILL — ZOLPIDEM TARTRATE 10 MG TAB: 10 | 90 days supply | Qty: 90 | Fill #0

## 2020-06-10 ENCOUNTER — Other Ambulatory Visit: Payer: Self-pay

## 2020-06-10 ENCOUNTER — Telehealth: Payer: Self-pay | Admitting: Hematology and Oncology

## 2020-06-10 ENCOUNTER — Inpatient Hospital Stay: Payer: 59 | Attending: Hematology and Oncology | Admitting: Hematology and Oncology

## 2020-06-10 ENCOUNTER — Encounter: Payer: Self-pay | Admitting: Hematology and Oncology

## 2020-06-10 DIAGNOSIS — Z79899 Other long term (current) drug therapy: Secondary | ICD-10-CM | POA: Diagnosis not present

## 2020-06-10 DIAGNOSIS — Z9221 Personal history of antineoplastic chemotherapy: Secondary | ICD-10-CM | POA: Insufficient documentation

## 2020-06-10 DIAGNOSIS — Z299 Encounter for prophylactic measures, unspecified: Secondary | ICD-10-CM | POA: Diagnosis not present

## 2020-06-10 DIAGNOSIS — C9210 Chronic myeloid leukemia, BCR/ABL-positive, not having achieved remission: Secondary | ICD-10-CM | POA: Diagnosis not present

## 2020-06-10 NOTE — Assessment & Plan Note (Signed)
The patient has achieved major molecular response without detectable BCR/ABL transcripts She is comfortable to continue on reduced dose Sprycel 50 mg daily She tolerated treatment very well with no major side effects She will continue the same treatment without dose adjustment.   I will continue to see her every 3 months with blood work to be done ahead of time The patient is educated to watch for signs and symptoms of shortness of breath/fluid retention

## 2020-06-10 NOTE — Assessment & Plan Note (Signed)
She has received her recent influenza vaccination She will arrange for booster injection for Covid vaccine in the near future

## 2020-06-10 NOTE — Progress Notes (Signed)
[facility] OFFICE PROGRESS NOTE  Patient Care Team: Panosh, Standley Brooking, MD as PCP - General [name], MD (Endocrinology) [name], MD (Internal Medicine) Everett Graff, MD as Attending Physician (Obstetrics and Gynecology) Heath Lark, MD as Consulting Physician (Hematology and Oncology) Chesley Mires, MD as Consulting Physician (Pulmonary Disease)  ASSESSMENT & PLAN:  CML (chronic myeloid leukemia) ([place]) The patient has achieved major molecular response without detectable BCR/ABL transcripts She is comfortable to continue on reduced dose Sprycel 50 mg daily She tolerated treatment very well with no major side effects She will continue the same treatment without dose adjustment.   I will continue to see her every 3 months with blood work to be done ahead of time The patient is educated to watch for signs and symptoms of shortness of breath/fluid retention  Preventive measure She has received her recent influenza vaccination She will arrange for booster injection for Covid vaccine in the near future   No orders of the defined types were placed in this encounter.   All questions were answered. The patient knows to call the clinic with any problems, questions or concerns. The total time spent in the appointment was 20 minutes encounter with patients including review of chart and various tests results, discussions about plan of care and coordination of care plan   Heath Lark, MD 06/10/2020 9:12 AM  INTERVAL HISTORY: Please see below for problem oriented charting. She returns for treatment and follow-up She denies recent side effects from therapy She has no problem getting her medications refilled She had received influenza vaccination recently No recent weight gain/fluid retention No shortness of breath or recent infection  SUMMARY OF ONCOLOGIC HISTORY: Oncology History  CML (chronic myeloid leukemia) ([place])  05/15/2015 Bone Marrow Biopsy   BM  biopsy and FISH confirmed CML [id number]   05/15/2015 - 05/20/2015 Chemotherapy   She started taking Hydrea 1500 mg daily   05/15/2015 Tumor Marker   BCR/ABL b2a2 87.5%, IS 49%   05/21/2015 -  Chemotherapy   She started taking Sprycel 100 mg daily   06/29/2015 Adverse Reaction   She had diarrhea. Dose of Srycel is reduced to 50 mg daily   09/10/2015 Tumor Marker   BCR/ABL b2a2 0.21%, IS 0.1743%   12/11/2015 Pathology Results   BCR/ABL b2a2 0.19%, IS 0.1577%   04/13/2016 Pathology Results   BCR/ABL b2a2 0.29%, IS 0.2407%   07/14/2016 Pathology Results   BCR/ABL b2a2 0.004%, IS 0.0092%. She is in MMR   07/21/2016 Miscellaneous   Dose of Sprycel is reduced to 50 mg daily   09/08/2016 Pathology Results   BCR/ABL b2a2 0.001%, IS 0.0023%. She is in MMR   06/14/2017 Pathology Results   BCR/ABL not detected. She is in MMR   10/11/2017 Pathology Results   BCR/ABL is not detected. She is in MMR   02/14/2018 Pathology Results   BCR/ABL is not detected. She is in MMR   05/17/2018 Pathology Results   BCR/ABL is not detected. She is in MMR   08/22/2018 Pathology Results   BCR/ABL is not detected. She is in MMR   11/30/2018 Pathology Results   BCR/ABL is not detected. She is in MMR   06/03/2019 Pathology Results   BCR/ABL is not detected. She is in MMR   09/03/2019 Pathology Results   BCR/ABL is not detected. She is in MMR   12/05/2019 Pathology Results   BCR/ABL is not detected. She is in MMR   06/01/2020 Pathology Results   BCR/ABL is not  detected. She is in MMR     REVIEW OF SYSTEMS:   Constitutional: Denies fevers, chills or abnormal weight loss Eyes: Denies blurriness of vision Ears, nose, mouth, throat, and face: Denies mucositis or sore throat Respiratory: Denies cough, dyspnea or wheezes Cardiovascular: Denies palpitation, chest discomfort or lower extremity swelling Gastrointestinal:  Denies nausea, heartburn or change in bowel habits Skin: Denies abnormal skin  rashes Lymphatics: Denies new lymphadenopathy or easy bruising Neurological:Denies numbness, tingling or new weaknesses Behavioral/Psych: Mood is stable, no new changes  All other systems were reviewed with the patient and are negative.  I have reviewed the past medical history, past surgical history, social history and family history with the patient and they are unchanged from previous note.  ALLERGIES:  has No Known Allergies.  MEDICATIONS:  Current Outpatient Medications  Medication Sig Dispense Refill  . zolpidem (AMBIEN) 10 MG tablet TAKE 1 TABLET BY MOUTH AT BEDTIME AS NEEDED FOR SLEEP 90 tablet 0  . dasatinib (SPRYCEL) 50 MG tablet TAKE 1 TABLET (50 MG TOTAL) BY MOUTH DAILY. 90 tablet 3  . fluocinonide cream (LIDEX) 6.75 % Apply 1 application topically 2 (two) times daily. For eczema (Patient not taking: Reported on 06/03/2020) 30 g 1  . losartan (COZAAR) 50 MG tablet TAKE 1 TABLET BY MOUTH DAILY. 90 tablet 0  . SYNTHROID 175 MCG tablet Take 1 tablet (175 mcg total) by mouth daily before breakfast. 90 tablet 2  . verapamil (CALAN-SR) 240 MG CR tablet TAKE 1 TABLET BY MOUTH AT BEDTIME. 90 tablet 0   No current facility-administered medications for this visit.    PHYSICAL EXAMINATION: ECOG PERFORMANCE STATUS: 0 - Asymptomatic  Vitals:   06/10/20 0756  BP: 120/67  Pulse: 62  Resp: 17  Temp: (!) 97.3 F (36.3 C)  SpO2: 100%   Filed Weights   06/10/20 0756  Weight: 161 lb 12.8 oz (73.4 kg)    GENERAL:alert, no distress and comfortable SKIN: skin color, texture, turgor are normal, no rashes or significant lesions EYES: normal, Conjunctiva are pink and non-injected, sclera clear OROPHARYNX:no exudate, no erythema and lips, buccal mucosa, and tongue normal  NECK: supple, thyroid normal size, non-tender, without nodularity LYMPH:  no palpable lymphadenopathy in the cervical, axillary or inguinal LUNGS: clear to auscultation and percussion with normal breathing  effort HEART: regular rate & rhythm and no murmurs and no lower extremity edema ABDOMEN:abdomen soft, non-tender and normal bowel sounds Musculoskeletal:no cyanosis of digits and no clubbing  NEURO: alert & oriented x 3 with fluent speech, no focal motor/sensory deficits  LABORATORY DATA:  I have reviewed the data as listed    Component Value Date/Time   NA 138 06/01/2020 0723   NA 138 02/07/2017 0836   K 4.3 06/01/2020 0723   K 4.2 02/07/2017 0836   CL 108 06/01/2020 0723   CO2 23 06/01/2020 0723   CO2 23 02/07/2017 0836   GLUCOSE 98 06/01/2020 0723   GLUCOSE 85 02/07/2017 0836   BUN 24 (H) 06/01/2020 0723   BUN 15.5 02/07/2017 0836   CREATININE 0.81 06/01/2020 0723   CREATININE 0.9 02/07/2017 0836   CALCIUM 9.4 06/01/2020 0723   CALCIUM 9.5 02/07/2017 0836   PROT 6.5 06/01/2020 0723   PROT 7.0 02/07/2017 0836   ALBUMIN 3.8 06/01/2020 0723   ALBUMIN 3.8 02/07/2017 0836   AST 16 06/01/2020 0723   AST 25 02/07/2017 0836   ALT 17 06/01/2020 0723   ALT 36 02/07/2017 0836   ALKPHOS 43 06/01/2020 0723  ALKPHOS 55 02/07/2017 0836   BILITOT 0.3 06/01/2020 0723   BILITOT 0.31 02/07/2017 0836   GFRNONAA >60 06/01/2020 0723   GFRAA >60 03/03/2020 0747    No results found for: SPEP, UPEP  Lab Results  Component Value Date   WBC 5.5 06/01/2020   NEUTROABS 2.5 06/01/2020   HGB 12.9 06/01/2020   HCT 37.8 06/01/2020   MCV 92.2 06/01/2020   PLT 277 06/01/2020      Chemistry      Component Value Date/Time   NA 138 06/01/2020 0723   NA 138 02/07/2017 0836   K 4.3 06/01/2020 0723   K 4.2 02/07/2017 0836   CL 108 06/01/2020 0723   CO2 23 06/01/2020 0723   CO2 23 02/07/2017 0836   BUN 24 (H) 06/01/2020 0723   BUN 15.5 02/07/2017 0836   CREATININE 0.81 06/01/2020 0723   CREATININE 0.9 02/07/2017 0836      Component Value Date/Time   CALCIUM 9.4 06/01/2020 0723   CALCIUM 9.5 02/07/2017 0836   ALKPHOS 43 06/01/2020 0723   ALKPHOS 55 02/07/2017 0836   AST 16  06/01/2020 0723   AST 25 02/07/2017 0836   ALT 17 06/01/2020 0723   ALT 36 02/07/2017 0836   BILITOT 0.3 06/01/2020 0723   BILITOT 0.31 02/07/2017 0836

## 2020-06-10 NOTE — Telephone Encounter (Signed)
Scheduled appointments per 11/3 los. Spoke to patient who is aware of appointment dates and times. Gave patient calendar print out.

## 2020-06-15 ENCOUNTER — Other Ambulatory Visit: Payer: Self-pay | Admitting: Hematology and Oncology

## 2020-06-18 MED FILL — SPRYCEL 50 MG TABLET: 50 | 30 days supply | Qty: 30 | Fill #0

## 2020-06-26 ENCOUNTER — Other Ambulatory Visit (HOSPITAL_COMMUNITY): Payer: Self-pay | Admitting: Orthopedic Surgery

## 2020-06-26 DIAGNOSIS — M25512 Pain in left shoulder: Secondary | ICD-10-CM

## 2020-07-06 ENCOUNTER — Other Ambulatory Visit: Payer: Self-pay | Admitting: Internal Medicine

## 2020-07-06 MED FILL — VERAPAMIL ER 240 MG TABLET: 240 | 90 days supply | Qty: 90 | Fill #0

## 2020-07-06 MED FILL — diazePAM 5 MG TABS: 5 | 1 days supply | Qty: 2 | Fill #0

## 2020-07-15 ENCOUNTER — Other Ambulatory Visit: Payer: Self-pay | Admitting: Internal Medicine

## 2020-07-15 MED FILL — LOSARTAN POTASSIUM 50 MG TA: 50 | 90 days supply | Qty: 90 | Fill #0

## 2020-07-16 ENCOUNTER — Other Ambulatory Visit: Payer: Self-pay

## 2020-07-16 ENCOUNTER — Ambulatory Visit (HOSPITAL_COMMUNITY)
Admission: RE | Admit: 2020-07-16 | Discharge: 2020-07-16 | Disposition: A | Discharge status: Home / Self Care | Payer: 59 | Source: Ambulatory Visit | Attending: Orthopedic Surgery | Admitting: Orthopedic Surgery

## 2020-07-16 DIAGNOSIS — M25512 Pain in left shoulder: Secondary | ICD-10-CM | POA: Diagnosis not present

## 2020-07-16 DIAGNOSIS — M7552 Bursitis of left shoulder: Secondary | ICD-10-CM | POA: Diagnosis not present

## 2020-07-16 DIAGNOSIS — M75102 Unspecified rotator cuff tear or rupture of left shoulder, not specified as traumatic: Secondary | ICD-10-CM | POA: Diagnosis not present

## 2020-07-16 DIAGNOSIS — M19012 Primary osteoarthritis, left shoulder: Secondary | ICD-10-CM | POA: Diagnosis not present

## 2020-07-16 IMAGING — MR MR SHOULDER*L* W/O CM
4 of 5 series · 19 of 40 positions shown · non-contrast
Comparison: Radiographs [DATE].

CLINICAL DATA: Left shoulder pain, unspecified chronicity.

EXAM:
MRI OF THE LEFT SHOULDER WITHOUT CONTRAST
TECHNIQUE: Multiplanar, multisequence MR imaging of the shoulder was performed.
No intravenous contrast was administered.

[Series 2: T2 fat-sat · axial · 4.0mm · 0.27mm/px · z∈[-86,-11]mm · 5 of 21 slices shown (1 of 3)]
[im 1/21]
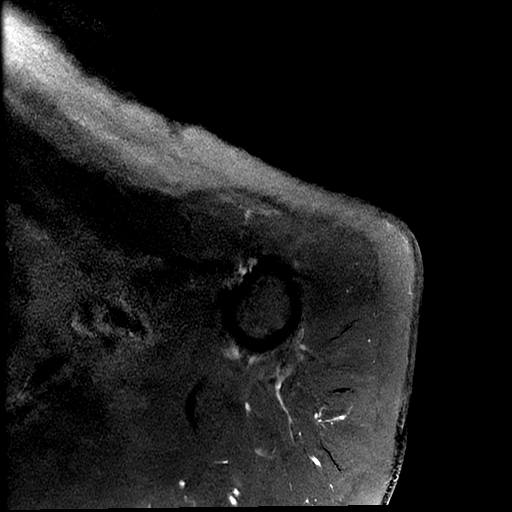
[im 3/21]
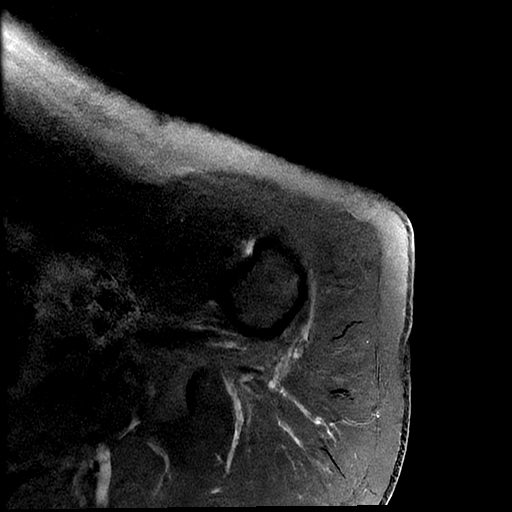
[im 6/21]
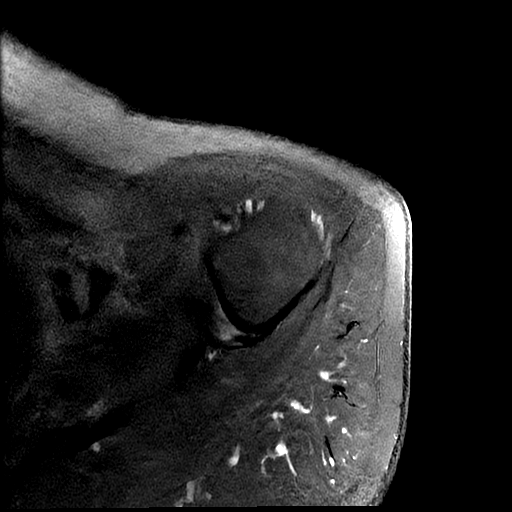
[im 12/21]
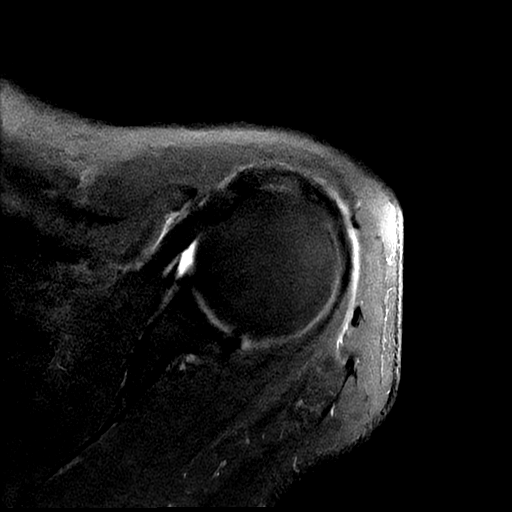
[im 18/21]
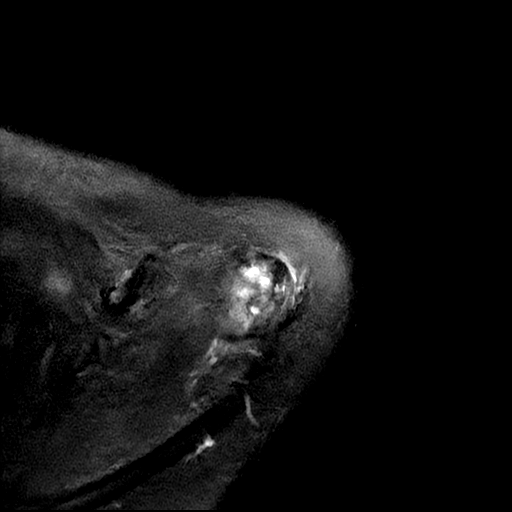

[Series 5: T2 fat-sat · coronal · 4.0mm · 0.31mm/px · 3 of 21 slices shown (2 of 3)]
[im 3/21]
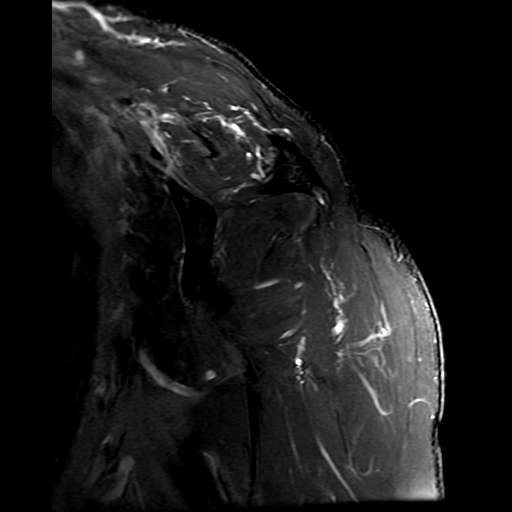
[im 12/21]
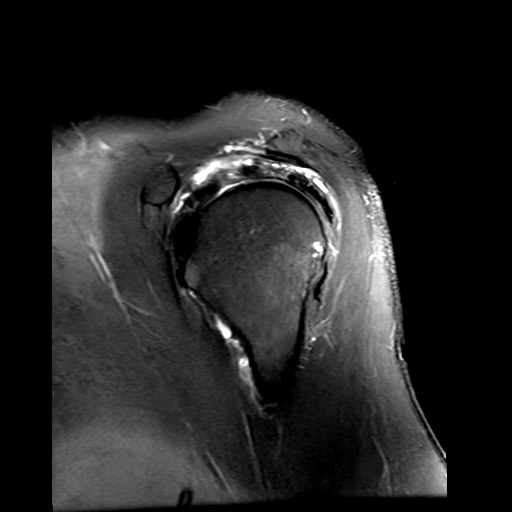
[im 18/21]
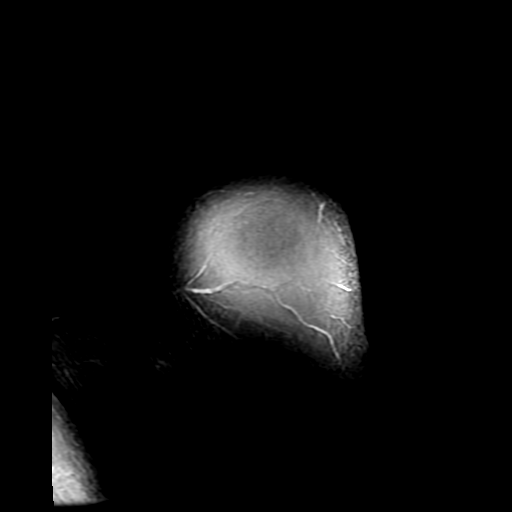

[Series 8: PD fat-sat · coronal · 4.0mm · 0.27mm/px · 8 of 21 slices shown]
[im 1/21]
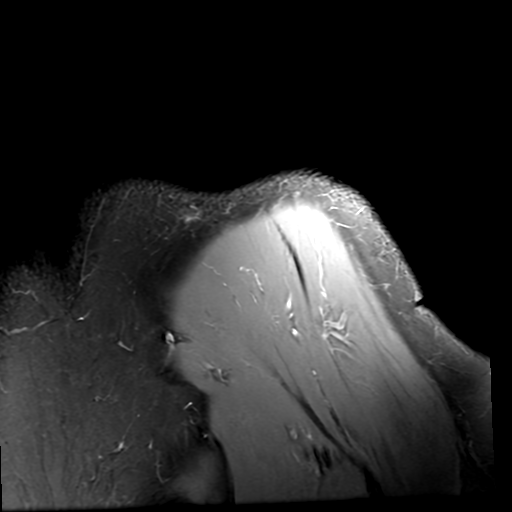
[im 3/21]
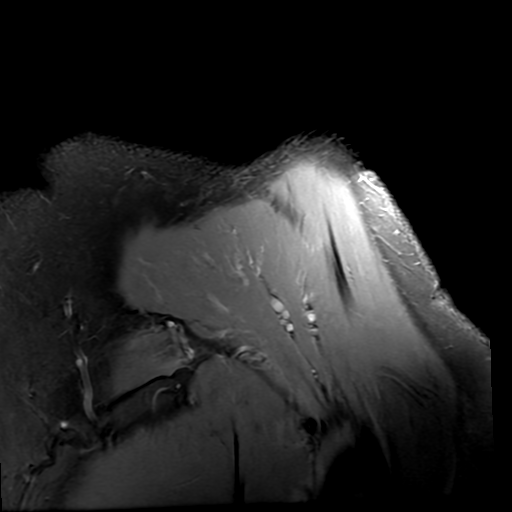
[im 6/21]
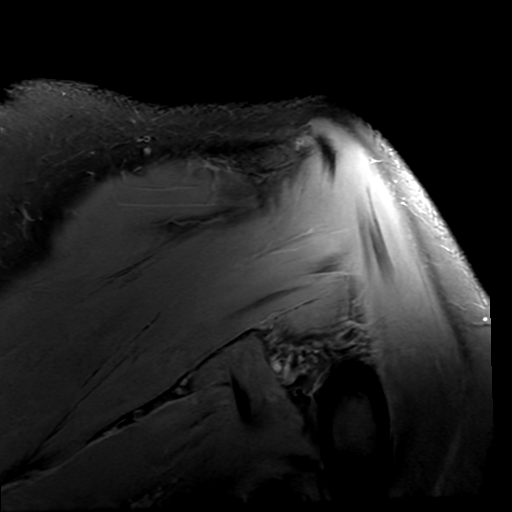
[im 9/21]
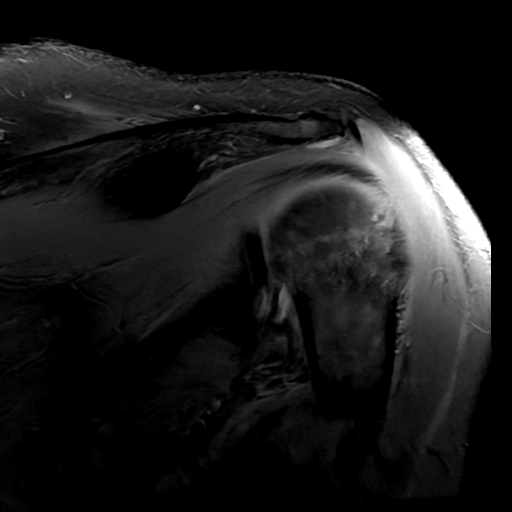
[im 12/21]
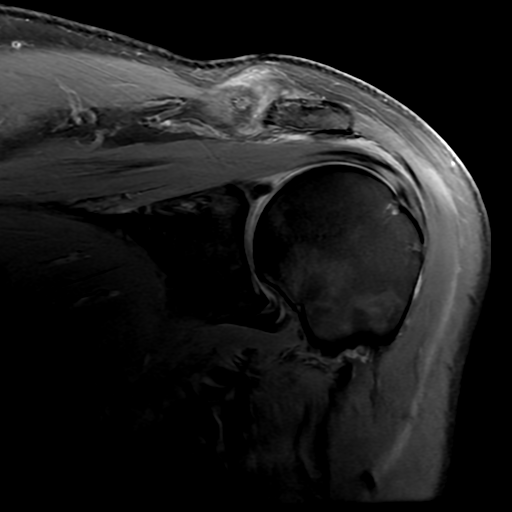
[im 15/21]
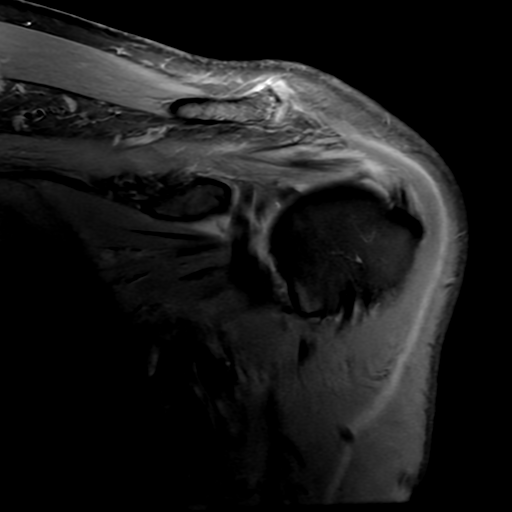
[im 18/21]
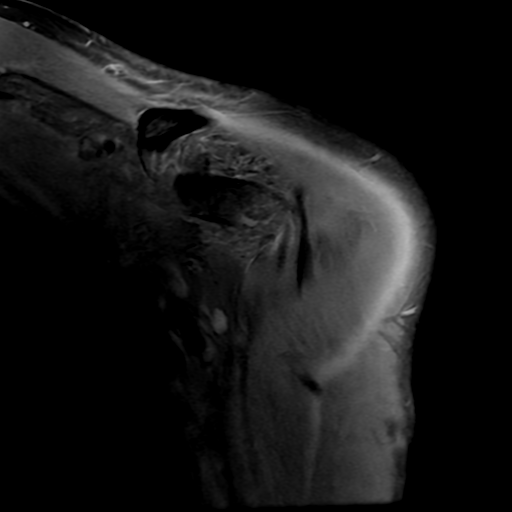
[im 21/21]
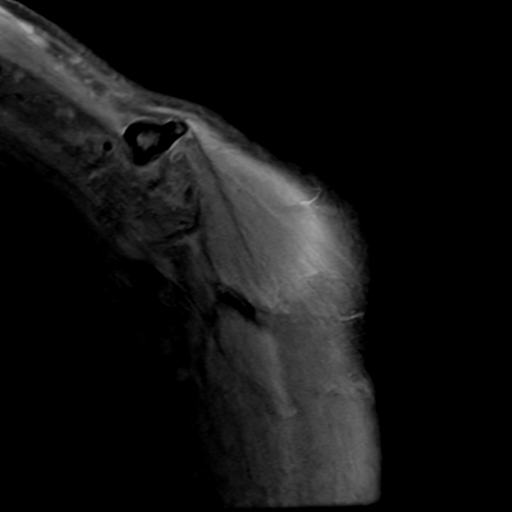

[Series 9: T2 fat-sat · coronal · 4.0mm · 0.27mm/px · 3 of 21 slices shown (3 of 3)]
[im 3/21]
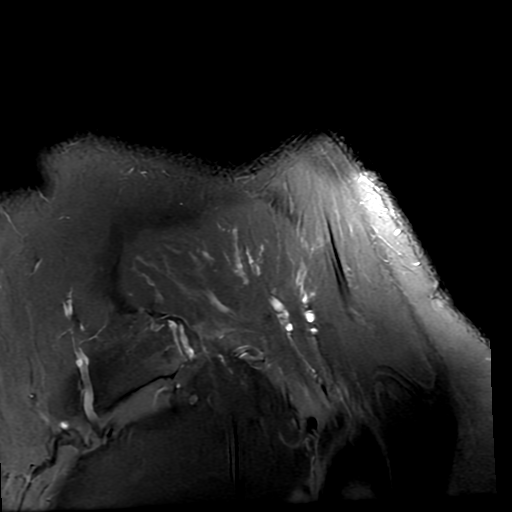
[im 12/21]
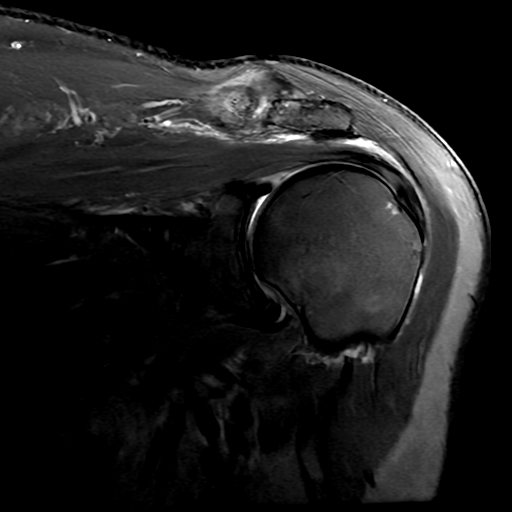
[im 18/21]
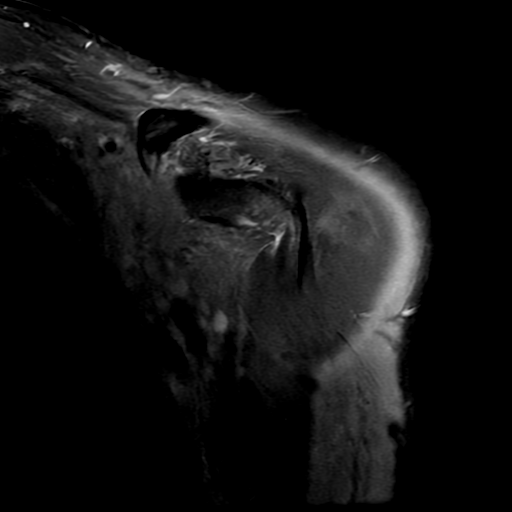

[19 of 40 positions shown; findings below may reference images not displayed]

FINDINGS: Rotator cuff: Supraspinatus tendinosis with bursal surface fraying
and partial tearing, best seen on the sagittal images. There is a
possible tiny full-thickness component, best seen on sagittal image
[DATE]. No tendon retraction. The infraspinatus, subscapularis and
teres minor tendons are intact.

Muscles:  No focal muscular atrophy or edema.

Biceps long head:  Intact and normally positioned.

Acromioclavicular Joint: The acromion is type 1. There are moderate
acromioclavicular degenerative changes and a small amount of fluid
in the subacromial-subdeltoid bursa.

Glenohumeral Joint: No significant shoulder joint effusion or
glenohumeral arthropathy.

Labrum:  No evidence of labral tear or paralabral cyst.

Bones: No acute or significant extra-articular osseous findings.

Other: No significant soft tissue findings.
IMPRESSION: 1. Supraspinatus tendinosis with bursal surface fraying and partial
tearing. There is a possible tiny full-thickness component. No
tendon retraction or muscular atrophy.
2. The additional components of the rotator cuff, biceps tendon and
labrum appear intact.
3. Moderate acromioclavicular degenerative changes and mild
subacromial-subdeltoid bursitis.

## 2020-07-17 ENCOUNTER — Encounter: Payer: Self-pay | Admitting: Adult Health

## 2020-07-22 MED FILL — SPRYCEL 50 MG TABLET: 50 | 30 days supply | Qty: 30 | Fill #1

## 2020-08-06 DIAGNOSIS — M67912 Unspecified disorder of synovium and tendon, left shoulder: Secondary | ICD-10-CM | POA: Diagnosis not present

## 2020-08-06 DIAGNOSIS — M4692 Unspecified inflammatory spondylopathy, cervical region: Secondary | ICD-10-CM | POA: Diagnosis not present

## 2020-08-11 DIAGNOSIS — M75112 Incomplete rotator cuff tear or rupture of left shoulder, not specified as traumatic: Secondary | ICD-10-CM | POA: Diagnosis not present

## 2020-08-11 DIAGNOSIS — M542 Cervicalgia: Secondary | ICD-10-CM | POA: Diagnosis not present

## 2020-08-13 DIAGNOSIS — M75112 Incomplete rotator cuff tear or rupture of left shoulder, not specified as traumatic: Secondary | ICD-10-CM | POA: Diagnosis not present

## 2020-08-13 DIAGNOSIS — M542 Cervicalgia: Secondary | ICD-10-CM | POA: Diagnosis not present

## 2020-08-14 ENCOUNTER — Other Ambulatory Visit: Payer: Self-pay | Admitting: Internal Medicine

## 2020-08-14 ENCOUNTER — Other Ambulatory Visit: Payer: Self-pay | Admitting: Family Medicine

## 2020-08-14 MED ORDER — SYNTHROID 175 MCG PO TABS
175.0000 ug | ORAL_TABLET | Freq: Every day | ORAL | 2 refills | Status: DC
Start: 2020-08-14 — End: 2020-08-14

## 2020-08-14 MED FILL — SYNTHROID 175 MCG TABLET: 175 | 90 days supply | Qty: 90 | Fill #0

## 2020-08-14 NOTE — Telephone Encounter (Signed)
Sent to the pharmacy by e-scribe. 

## 2020-08-18 DIAGNOSIS — M542 Cervicalgia: Secondary | ICD-10-CM | POA: Diagnosis not present

## 2020-08-18 DIAGNOSIS — M75112 Incomplete rotator cuff tear or rupture of left shoulder, not specified as traumatic: Secondary | ICD-10-CM | POA: Diagnosis not present

## 2020-08-20 DIAGNOSIS — M542 Cervicalgia: Secondary | ICD-10-CM | POA: Diagnosis not present

## 2020-08-20 DIAGNOSIS — M75112 Incomplete rotator cuff tear or rupture of left shoulder, not specified as traumatic: Secondary | ICD-10-CM | POA: Diagnosis not present

## 2020-08-20 MED FILL — SPRYCEL 50 MG TABLET: 50 | 30 days supply | Qty: 30 | Fill #2

## 2020-08-27 DIAGNOSIS — M67912 Unspecified disorder of synovium and tendon, left shoulder: Secondary | ICD-10-CM | POA: Diagnosis not present

## 2020-08-27 DIAGNOSIS — M542 Cervicalgia: Secondary | ICD-10-CM | POA: Diagnosis not present

## 2020-08-27 DIAGNOSIS — M75112 Incomplete rotator cuff tear or rupture of left shoulder, not specified as traumatic: Secondary | ICD-10-CM | POA: Diagnosis not present

## 2020-08-31 DIAGNOSIS — M542 Cervicalgia: Secondary | ICD-10-CM | POA: Diagnosis not present

## 2020-08-31 DIAGNOSIS — M75112 Incomplete rotator cuff tear or rupture of left shoulder, not specified as traumatic: Secondary | ICD-10-CM | POA: Diagnosis not present

## 2020-09-01 ENCOUNTER — Other Ambulatory Visit: Payer: Self-pay | Admitting: Internal Medicine

## 2020-09-01 ENCOUNTER — Inpatient Hospital Stay: Payer: 59 | Attending: Hematology and Oncology

## 2020-09-01 ENCOUNTER — Other Ambulatory Visit: Payer: Self-pay

## 2020-09-01 DIAGNOSIS — Z1231 Encounter for screening mammogram for malignant neoplasm of breast: Secondary | ICD-10-CM

## 2020-09-01 DIAGNOSIS — C9210 Chronic myeloid leukemia, BCR/ABL-positive, not having achieved remission: Secondary | ICD-10-CM | POA: Diagnosis not present

## 2020-09-01 LAB — COMPREHENSIVE METABOLIC PANEL
ALT: 18 U/L (ref 0–44)
AST: 17 U/L (ref 15–41)
Albumin: 3.8 g/dL (ref 3.5–5.0)
Alkaline Phosphatase: 40 U/L (ref 38–126)
Anion gap: 9 (ref 5–15)
BUN: 25 mg/dL — ABNORMAL HIGH (ref 6–20)
CO2: 22 mmol/L (ref 22–32)
Calcium: 9.1 mg/dL (ref 8.9–10.3)
Chloride: 106 mmol/L (ref 98–111)
Creatinine, Ser: 0.84 mg/dL (ref 0.44–1.00)
GFR, Estimated: >60 mL/min (ref >60)
Glucose, Bld: 92 mg/dL (ref 70–99)
Potassium: 4.1 mmol/L (ref 3.5–5.1)
Sodium: 137 mmol/L (ref 135–145)
Total Bilirubin: 0.4 mg/dL (ref 0.3–1.2)
Total Protein: 6.7 g/dL (ref 6.5–8.1)

## 2020-09-01 LAB — CBC WITH DIFFERENTIAL/PLATELET
Abs Immature Granulocytes: 0.01 10*3/uL (ref 0.00–0.07)
Basophils Absolute: 0.1 10*3/uL (ref 0.0–0.1)
Basophils Relative: 2 %
Eosinophils Absolute: 0.5 10*3/uL (ref 0.0–0.5)
Eosinophils Relative: 8 %
HCT: 38.6 % (ref 36.0–46.0)
Hemoglobin: 13 g/dL (ref 12.0–15.0)
Immature Granulocytes: 0 %
Lymphocytes Relative: 30 %
Lymphs Abs: 1.7 10*3/uL (ref 0.7–4.0)
MCH: 31.3 pg (ref 26.0–34.0)
MCHC: 33.7 g/dL (ref 30.0–36.0)
MCV: 92.8 fL (ref 80.0–100.0)
Monocytes Absolute: 0.4 10*3/uL (ref 0.1–1.0)
Monocytes Relative: 7 %
Neutro Abs: 3.1 10*3/uL (ref 1.7–7.7)
Neutrophils Relative %: 53 %
Platelets: 265 10*3/uL (ref 150–400)
RBC: 4.16 MIL/uL (ref 3.87–5.11)
RDW: 13.1 % (ref 11.5–15.5)
WBC: 5.8 10*3/uL (ref 4.0–10.5)
nRBC: 0 % (ref 0.0–0.2)

## 2020-09-01 NOTE — Telephone Encounter (Signed)
Last office visit- 06/03/2020 Last refill- 06/08/2020

## 2020-09-02 ENCOUNTER — Other Ambulatory Visit: Payer: Self-pay | Admitting: Internal Medicine

## 2020-09-02 DIAGNOSIS — M75112 Incomplete rotator cuff tear or rupture of left shoulder, not specified as traumatic: Secondary | ICD-10-CM | POA: Diagnosis not present

## 2020-09-02 DIAGNOSIS — M542 Cervicalgia: Secondary | ICD-10-CM | POA: Diagnosis not present

## 2020-09-02 NOTE — Telephone Encounter (Signed)
She sees Dr. Panosh  

## 2020-09-05 MED FILL — ZOLPIDEM TARTRATE 10 MG TAB: 10 | 90 days supply | Qty: 90 | Fill #0

## 2020-09-07 DIAGNOSIS — M542 Cervicalgia: Secondary | ICD-10-CM | POA: Diagnosis not present

## 2020-09-07 DIAGNOSIS — M75112 Incomplete rotator cuff tear or rupture of left shoulder, not specified as traumatic: Secondary | ICD-10-CM | POA: Diagnosis not present

## 2020-09-07 LAB — BCR/ABL

## 2020-09-10 DIAGNOSIS — M75112 Incomplete rotator cuff tear or rupture of left shoulder, not specified as traumatic: Secondary | ICD-10-CM | POA: Diagnosis not present

## 2020-09-10 DIAGNOSIS — M542 Cervicalgia: Secondary | ICD-10-CM | POA: Diagnosis not present

## 2020-09-11 ENCOUNTER — Encounter: Payer: Self-pay | Admitting: Hematology and Oncology

## 2020-09-11 ENCOUNTER — Other Ambulatory Visit: Payer: Self-pay

## 2020-09-11 ENCOUNTER — Inpatient Hospital Stay: Payer: 59 | Attending: Hematology and Oncology | Admitting: Hematology and Oncology

## 2020-09-11 ENCOUNTER — Telehealth: Payer: Self-pay | Admitting: Hematology and Oncology

## 2020-09-11 DIAGNOSIS — C9210 Chronic myeloid leukemia, BCR/ABL-positive, not having achieved remission: Secondary | ICD-10-CM | POA: Insufficient documentation

## 2020-09-11 NOTE — Telephone Encounter (Signed)
Scheduled appointments per 2/4 sch msg. Spoke to patient who is aware of appointments dates and times. Gave patient calendar print out.

## 2020-09-11 NOTE — Progress Notes (Signed)
[facility] OFFICE PROGRESS NOTE  Patient Care Team: Panosh, Wanda K, MD as PCP - General Balan, Bindubal, MD (Endocrinology) Anderson, Anthony, MD (Internal Medicine) Roberts, Angela, MD as Attending Physician (Obstetrics and Gynecology) Gorsuch, Ni, MD as Consulting Physician (Hematology and Oncology) Sood, Vineet, MD as Consulting Physician (Pulmonary Disease)  ASSESSMENT & PLAN:  CML (chronic myeloid leukemia) (HCC) The patient has achieved major molecular response without detectable BCR/ABL transcripts She is comfortable to continue on reduced dose Sprycel 50 mg daily She tolerated treatment very well with no major side effects She will continue the same treatment without dose adjustment.   I will continue to see her every 3 months with blood work to be done ahead of time The patient is educated to watch for signs and symptoms of shortness of breath/fluid retention We discussed possibilities of discontinuation of treatment with active surveillance but for now, she is comfortable continuing treatment indefinitely   No orders of the defined types were placed in this encounter.   All questions were answered. The patient knows to call the clinic with any problems, questions or concerns. The total time spent in the appointment was 20 minutes encounter with patients including review of chart and various tests results, discussions about plan of care and coordination of care plan   Ni Gorsuch, MD 09/11/2020 8:16 AM  INTERVAL HISTORY: Please see below for problem oriented charting. She returns for further follow-up She is doing well No side effects from treatment so far such as shortness of breath No recent infection, fever or chills She is compliant taking Sprycel as directed and has no problem refilling her prescription  SUMMARY OF ONCOLOGIC HISTORY: Oncology History  CML (chronic myeloid leukemia) (HCC)  05/15/2015 Bone Marrow Biopsy   BM biopsy and FISH confirmed  CML [id number]   05/15/2015 - 05/20/2015 Chemotherapy   She started taking Hydrea 1500 mg daily   05/15/2015 Tumor Marker   BCR/ABL b2a2 87.5%, IS 49%   05/21/2015 -  Chemotherapy   She started taking Sprycel 100 mg daily   06/29/2015 Adverse Reaction   She had diarrhea. Dose of Srycel is reduced to 50 mg daily   09/10/2015 Tumor Marker   BCR/ABL b2a2 0.21%, IS 0.1743%   12/11/2015 Pathology Results   BCR/ABL b2a2 0.19%, IS 0.1577%   04/13/2016 Pathology Results   BCR/ABL b2a2 0.29%, IS 0.2407%   07/14/2016 Pathology Results   BCR/ABL b2a2 0.004%, IS 0.0092%. She is in MMR   07/21/2016 Miscellaneous   Dose of Sprycel is reduced to 50 mg daily   09/08/2016 Pathology Results   BCR/ABL b2a2 0.001%, IS 0.0023%. She is in MMR   06/14/2017 Pathology Results   BCR/ABL not detected. She is in MMR   10/11/2017 Pathology Results   BCR/ABL is not detected. She is in MMR   02/14/2018 Pathology Results   BCR/ABL is not detected. She is in MMR   05/17/2018 Pathology Results   BCR/ABL is not detected. She is in MMR   08/22/2018 Pathology Results   BCR/ABL is not detected. She is in MMR   11/30/2018 Pathology Results   BCR/ABL is not detected. She is in MMR   06/03/2019 Pathology Results   BCR/ABL is not detected. She is in MMR   09/03/2019 Pathology Results   BCR/ABL is not detected. She is in MMR   12/05/2019 Pathology Results   BCR/ABL is not detected. She is in MMR   06/01/2020 Pathology Results   BCR/ABL is not detected. She   is in MMR   09/01/2020 Pathology Results   BCR/ABL is not detected. She is in MMR     REVIEW OF SYSTEMS:   Constitutional: Denies fevers, chills or abnormal weight loss Eyes: Denies blurriness of vision Ears, nose, mouth, throat, and face: Denies mucositis or sore throat Respiratory: Denies cough, dyspnea or wheezes Cardiovascular: Denies palpitation, chest discomfort or lower extremity swelling Gastrointestinal:  Denies nausea, heartburn or change in  bowel habits Skin: Denies abnormal skin rashes Lymphatics: Denies new lymphadenopathy or easy bruising Neurological:Denies numbness, tingling or new weaknesses Behavioral/Psych: Mood is stable, no new changes  All other systems were reviewed with the patient and are negative.  I have reviewed the past medical history, past surgical history, social history and family history with the patient and they are unchanged from previous note.  ALLERGIES:  has No Known Allergies.  MEDICATIONS:  Current Outpatient Medications  Medication Sig Dispense Refill  . cholecalciferol (VITAMIN D3) 25 MCG (1000 UNIT) tablet Take 6,000 Units by mouth daily.    Marland Kitchen zolpidem (AMBIEN) 10 MG tablet TAKE 1 TABLET BY MOUTH AT BEDTIME AS NEEDED FOR SLEEP 90 tablet 0  . dasatinib (SPRYCEL) 50 MG tablet TAKE 1 TABLET BY MOUTH ONCE DAILY 30 tablet 11  . fluocinonide cream (LIDEX) 3.15 % Apply 1 application topically 2 (two) times daily. For eczema (Patient not taking: Reported on 06/03/2020) 30 g 1  . losartan (COZAAR) 50 MG tablet TAKE 1 TABLET BY MOUTH DAILY. 90 tablet 0  . SYNTHROID 175 MCG tablet Take 1 tablet (175 mcg total) by mouth daily before breakfast. 90 tablet 2  . verapamil (CALAN-SR) 240 MG CR tablet TAKE 1 TABLET BY MOUTH AT BEDTIME. 90 tablet 3   No current facility-administered medications for this visit.    PHYSICAL EXAMINATION: ECOG PERFORMANCE STATUS: 0 - Asymptomatic  Vitals:   09/11/20 0800  BP: 120/62  Pulse: 72  Resp: 18  Temp: 97.9 F (36.6 C)  SpO2: 100%   Filed Weights   09/11/20 0800  Weight: 166 lb 9.6 oz (75.6 kg)    GENERAL:alert, no distress and comfortable SKIN: skin color, texture, turgor are normal, no rashes or significant lesions EYES: normal, Conjunctiva are pink and non-injected, sclera clear OROPHARYNX:no exudate, no erythema and lips, buccal mucosa, and tongue normal  NECK: supple, thyroid normal size, non-tender, without nodularity LYMPH:  no palpable  lymphadenopathy in the cervical, axillary or inguinal LUNGS: clear to auscultation and percussion with normal breathing effort HEART: regular rate & rhythm and no murmurs and no lower extremity edema ABDOMEN:abdomen soft, non-tender and normal bowel sounds Musculoskeletal:no cyanosis of digits and no clubbing  NEURO: alert & oriented x 3 with fluent speech, no focal motor/sensory deficits  LABORATORY DATA:  I have reviewed the data as listed    Component Value Date/Time   NA 137 09/01/2020 0721   NA 138 02/07/2017 0836   K 4.1 09/01/2020 0721   K 4.2 02/07/2017 0836   CL 106 09/01/2020 0721   CO2 22 09/01/2020 0721   CO2 23 02/07/2017 0836   GLUCOSE 92 09/01/2020 0721   GLUCOSE 85 02/07/2017 0836   BUN 25 (H) 09/01/2020 0721   BUN 15.5 02/07/2017 0836   CREATININE 0.84 09/01/2020 0721   CREATININE 0.9 02/07/2017 0836   CALCIUM 9.1 09/01/2020 0721   CALCIUM 9.5 02/07/2017 0836   PROT 6.7 09/01/2020 0721   PROT 7.0 02/07/2017 0836   ALBUMIN 3.8 09/01/2020 0721   ALBUMIN 3.8 02/07/2017 0836  AST 17 09/01/2020 0721   AST 25 02/07/2017 0836   ALT 18 09/01/2020 0721   ALT 36 02/07/2017 0836   ALKPHOS 40 09/01/2020 0721   ALKPHOS 55 02/07/2017 0836   BILITOT 0.4 09/01/2020 0721   BILITOT 0.31 02/07/2017 0836   GFRNONAA >60 09/01/2020 0721   GFRAA >60 03/03/2020 0747    No results found for: SPEP, UPEP  Lab Results  Component Value Date   WBC 5.8 09/01/2020   NEUTROABS 3.1 09/01/2020   HGB 13.0 09/01/2020   HCT 38.6 09/01/2020   MCV 92.8 09/01/2020   PLT 265 09/01/2020      Chemistry      Component Value Date/Time   NA 137 09/01/2020 0721   NA 138 02/07/2017 0836   K 4.1 09/01/2020 0721   K 4.2 02/07/2017 0836   CL 106 09/01/2020 0721   CO2 22 09/01/2020 0721   CO2 23 02/07/2017 0836   BUN 25 (H) 09/01/2020 0721   BUN 15.5 02/07/2017 0836   CREATININE 0.84 09/01/2020 0721   CREATININE 0.9 02/07/2017 0836      Component Value Date/Time   CALCIUM 9.1  09/01/2020 0721   CALCIUM 9.5 02/07/2017 0836   ALKPHOS 40 09/01/2020 0721   ALKPHOS 55 02/07/2017 0836   AST 17 09/01/2020 0721   AST 25 02/07/2017 0836   ALT 18 09/01/2020 0721   ALT 36 02/07/2017 0836   BILITOT 0.4 09/01/2020 0721   BILITOT 0.31 02/07/2017 0836       

## 2020-09-11 NOTE — Assessment & Plan Note (Signed)
The patient has achieved major molecular response without detectable BCR/ABL transcripts She is comfortable to continue on reduced dose Sprycel 50 mg daily She tolerated treatment very well with no major side effects She will continue the same treatment without dose adjustment.   I will continue to see her every 3 months with blood work to be done ahead of time The patient is educated to watch for signs and symptoms of shortness of breath/fluid retention We discussed possibilities of discontinuation of treatment with active surveillance but for now, she is comfortable continuing treatment indefinitely

## 2020-09-14 DIAGNOSIS — M75112 Incomplete rotator cuff tear or rupture of left shoulder, not specified as traumatic: Secondary | ICD-10-CM | POA: Diagnosis not present

## 2020-09-14 DIAGNOSIS — M542 Cervicalgia: Secondary | ICD-10-CM | POA: Diagnosis not present

## 2020-09-16 DIAGNOSIS — M75112 Incomplete rotator cuff tear or rupture of left shoulder, not specified as traumatic: Secondary | ICD-10-CM | POA: Diagnosis not present

## 2020-09-16 DIAGNOSIS — M542 Cervicalgia: Secondary | ICD-10-CM | POA: Diagnosis not present

## 2020-09-22 MED FILL — SPRYCEL 50 MG TABLET: 50 | 30 days supply | Qty: 30 | Fill #3

## 2020-10-05 MED FILL — VERAPAMIL ER 240 MG TABLET: 240 | 90 days supply | Qty: 90 | Fill #1

## 2020-10-08 DIAGNOSIS — M25512 Pain in left shoulder: Secondary | ICD-10-CM | POA: Diagnosis not present

## 2020-10-08 DIAGNOSIS — M542 Cervicalgia: Secondary | ICD-10-CM | POA: Diagnosis not present

## 2020-10-08 DIAGNOSIS — M67912 Unspecified disorder of synovium and tendon, left shoulder: Secondary | ICD-10-CM | POA: Diagnosis not present

## 2020-10-13 DIAGNOSIS — M75112 Incomplete rotator cuff tear or rupture of left shoulder, not specified as traumatic: Secondary | ICD-10-CM | POA: Diagnosis not present

## 2020-10-13 DIAGNOSIS — M542 Cervicalgia: Secondary | ICD-10-CM | POA: Diagnosis not present

## 2020-10-15 ENCOUNTER — Ambulatory Visit: Payer: 59

## 2020-10-15 DIAGNOSIS — M75112 Incomplete rotator cuff tear or rupture of left shoulder, not specified as traumatic: Secondary | ICD-10-CM | POA: Diagnosis not present

## 2020-10-15 DIAGNOSIS — M542 Cervicalgia: Secondary | ICD-10-CM | POA: Diagnosis not present

## 2020-10-19 ENCOUNTER — Telehealth: Payer: Self-pay

## 2020-10-19 DIAGNOSIS — M75112 Incomplete rotator cuff tear or rupture of left shoulder, not specified as traumatic: Secondary | ICD-10-CM | POA: Diagnosis not present

## 2020-10-19 DIAGNOSIS — M542 Cervicalgia: Secondary | ICD-10-CM | POA: Diagnosis not present

## 2020-10-19 NOTE — Telephone Encounter (Signed)
Obtained a Sprycel copay card for patient to make Sprycel copay $0.  I shared the info with [facility] [id number] PCN [id number] ID [id number] [place]  [place] Patient [place] Phone [phone] Fax [phone] 10/19/2020 9:11 AM

## 2020-10-22 ENCOUNTER — Other Ambulatory Visit: Payer: Self-pay

## 2020-10-22 ENCOUNTER — Ambulatory Visit
Admission: RE | Admit: 2020-10-22 | Discharge: 2020-10-22 | Disposition: A | Discharge status: Home / Self Care | Payer: 59 | Source: Ambulatory Visit | Attending: Internal Medicine | Admitting: Internal Medicine

## 2020-10-22 DIAGNOSIS — M542 Cervicalgia: Secondary | ICD-10-CM | POA: Diagnosis not present

## 2020-10-22 DIAGNOSIS — M75112 Incomplete rotator cuff tear or rupture of left shoulder, not specified as traumatic: Secondary | ICD-10-CM | POA: Diagnosis not present

## 2020-10-22 DIAGNOSIS — Z1231 Encounter for screening mammogram for malignant neoplasm of breast: Secondary | ICD-10-CM

## 2020-10-22 IMAGING — MG MM DIGITAL SCREENING BILAT W/ TOMO AND CAD
8 series · 8 of 24 positions shown · non-contrast
Comparison: Previous exam(s).

CLINICAL DATA: Screening.

EXAM:
DIGITAL SCREENING BILATERAL MAMMOGRAM WITH TOMOSYNTHESIS AND CAD
TECHNIQUE: Bilateral screening digital craniocaudal and mediolateral oblique
mammograms were obtained. Bilateral screening digital breast
tomosynthesis was performed. The images were evaluated with
computer-aided detection.

[R MLO synth-2D]
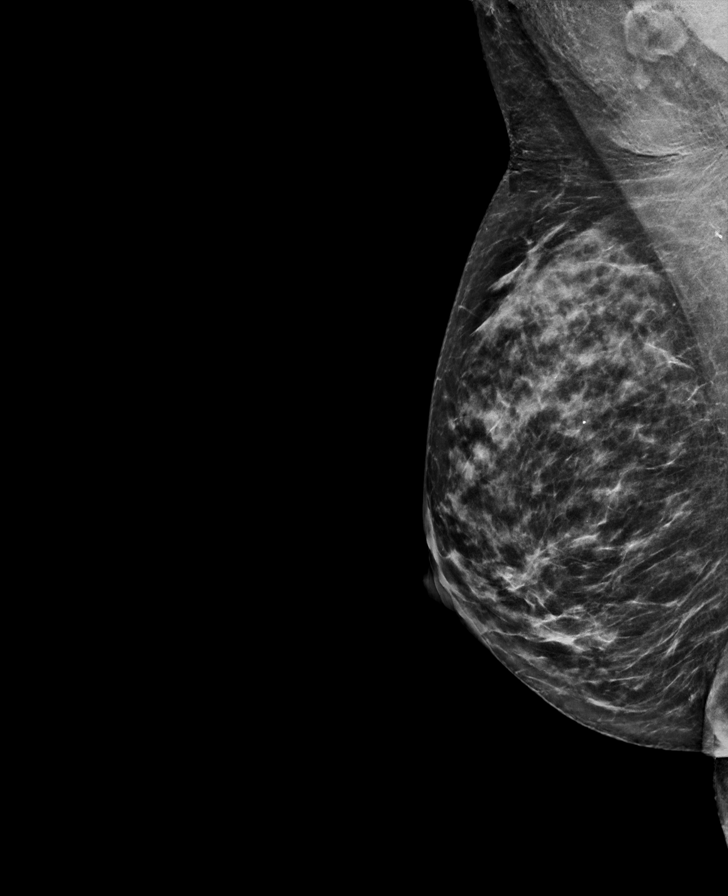

[L CC synth-2D]
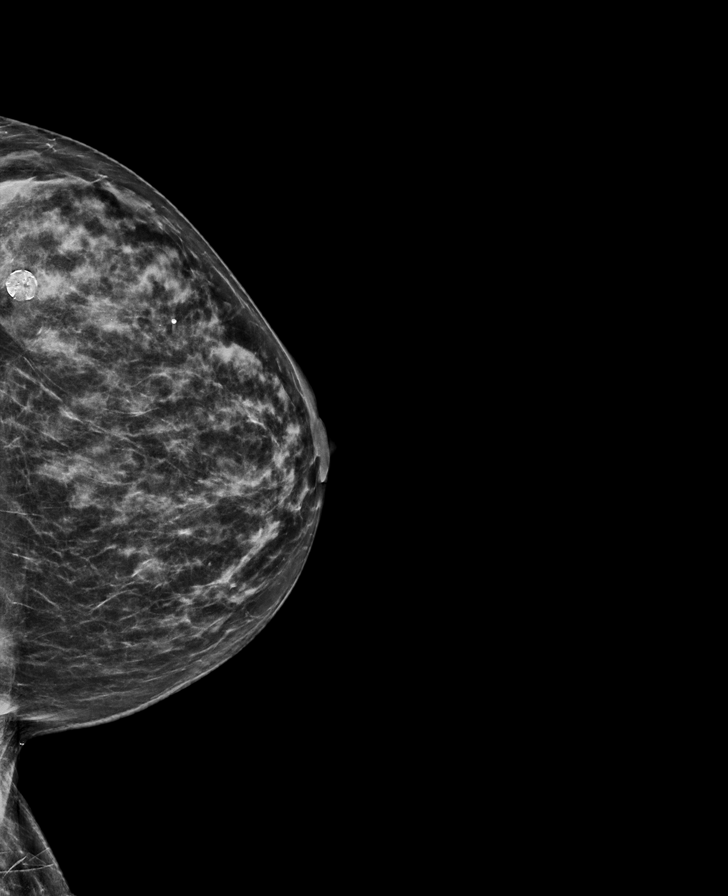

[R CC synth-2D]
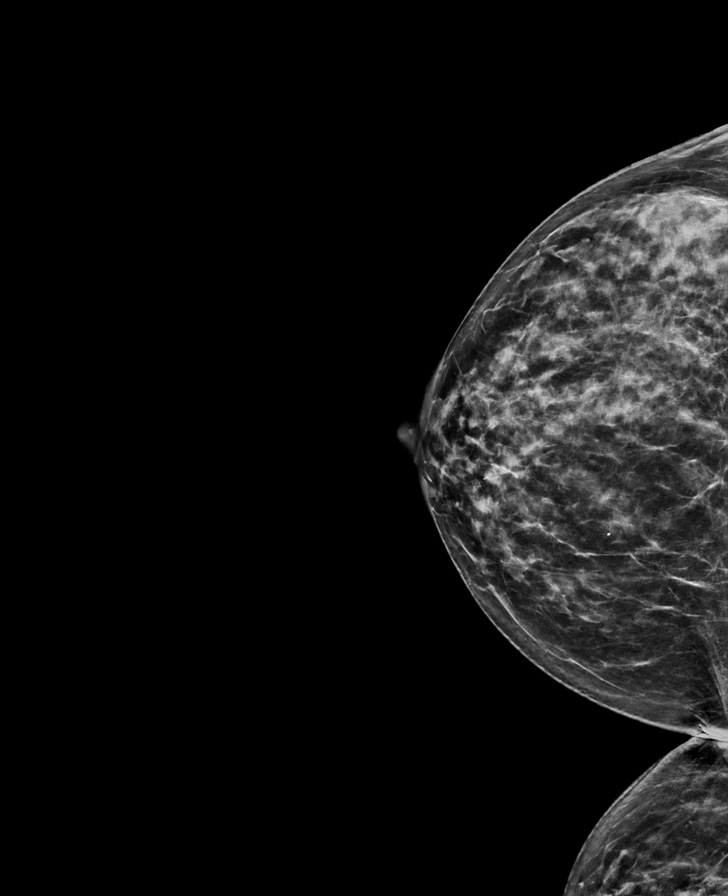

[L MLO synth-2D]
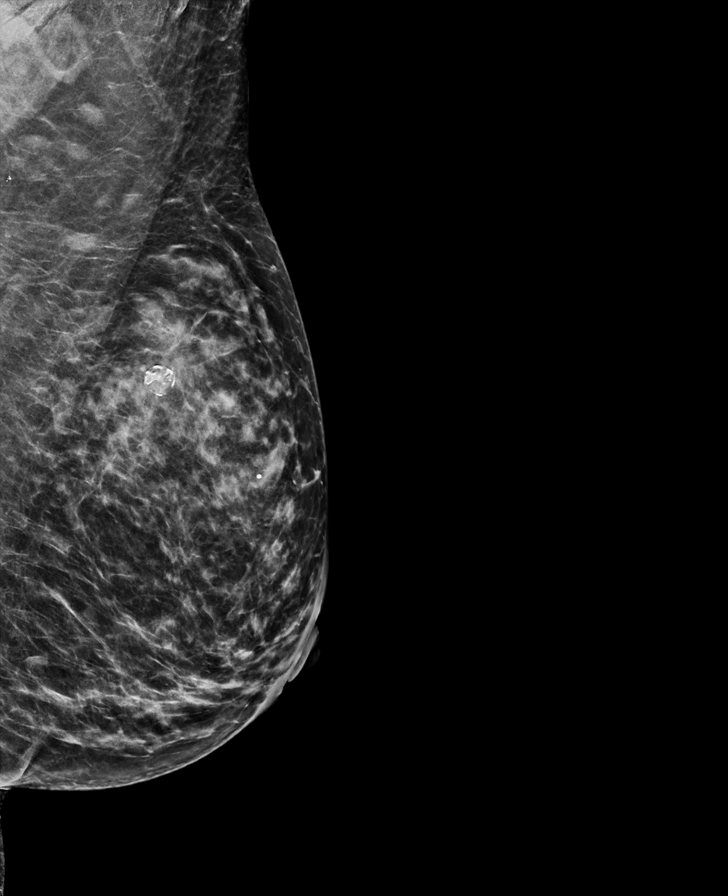

[L CC tomo · tomo slice 31/62.0]
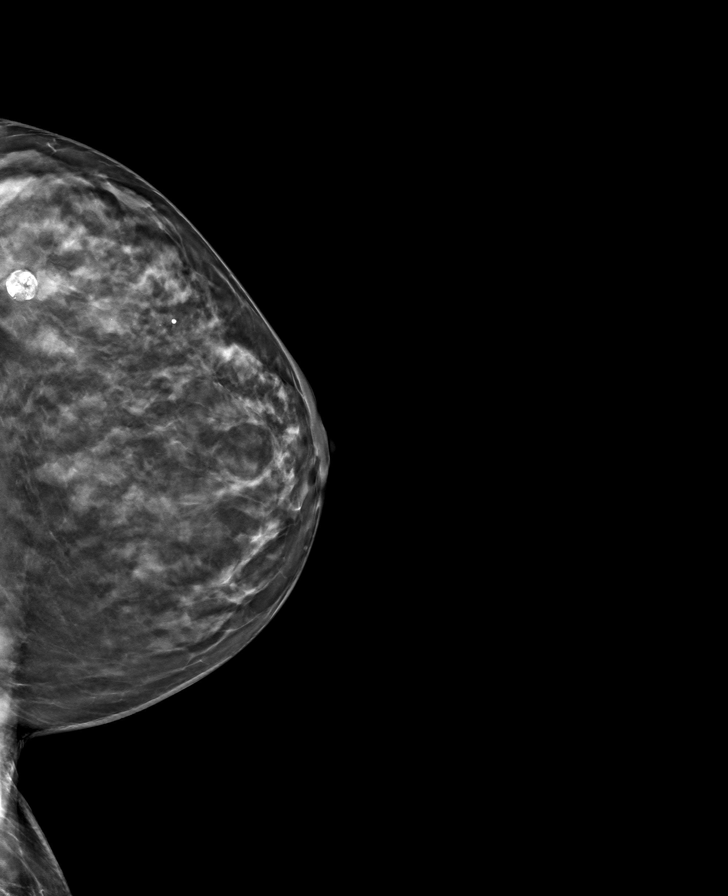

[R MLO tomo · tomo slice 33/66.0]
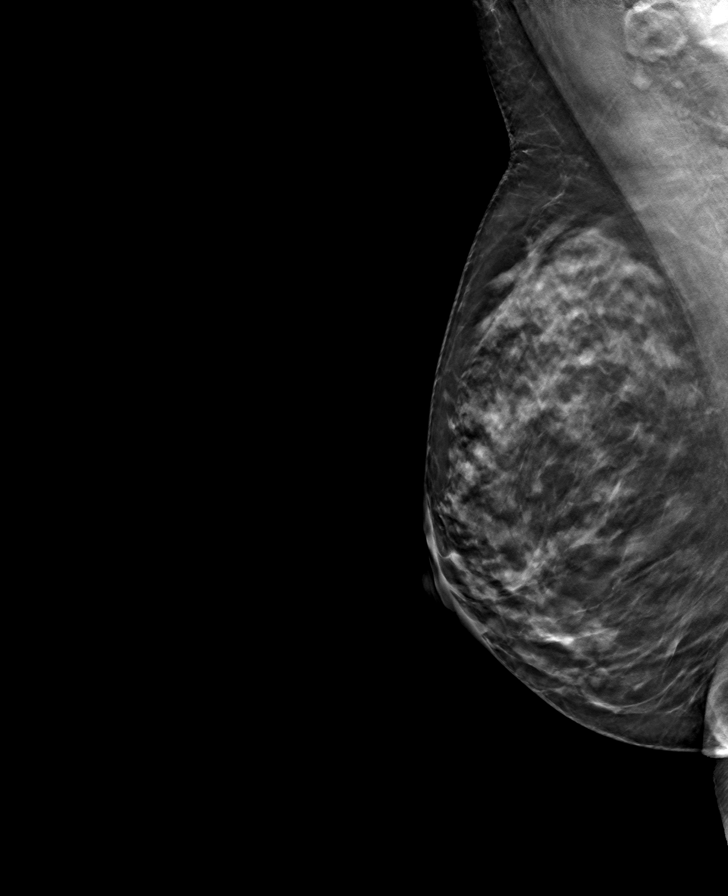

[R CC tomo · tomo slice 33/64.0]
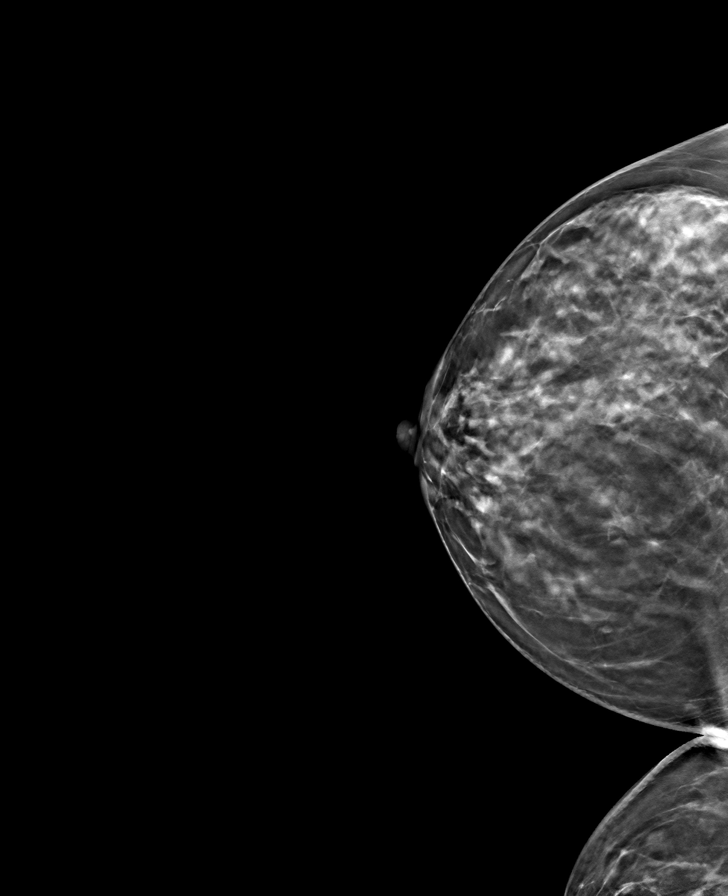

[L MLO tomo · tomo slice 32/63.0]
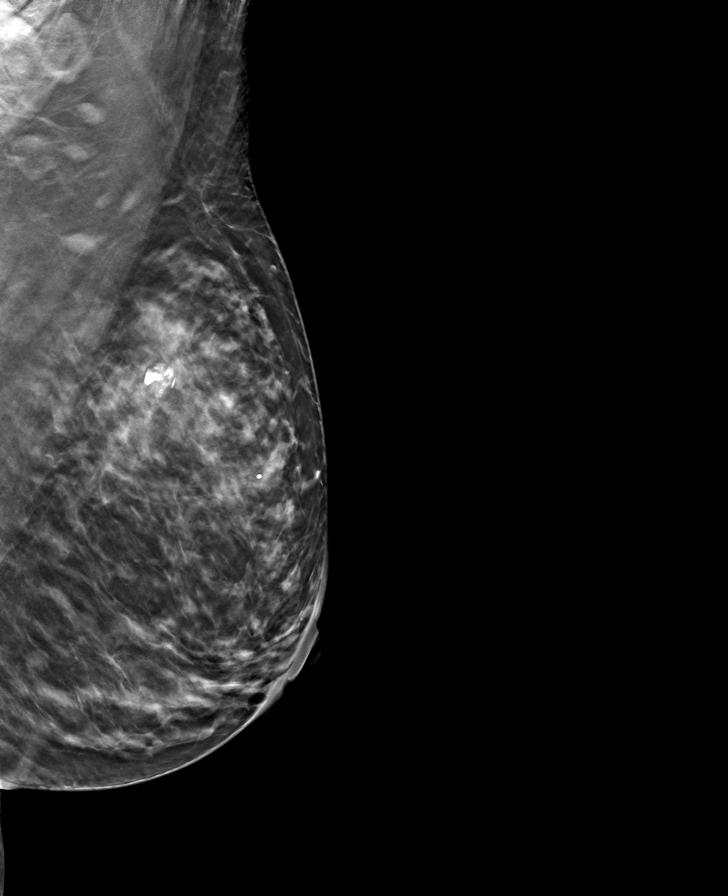

[8 of 24 positions shown; findings below may reference images not displayed]

ACR Breast Density Category c: The breast tissue is heterogeneously
dense, which may obscure small masses.
FINDINGS: There are no findings suspicious for malignancy. The images were
evaluated with computer-aided detection.
IMPRESSION: No mammographic evidence of malignancy. A result letter of this
screening mammogram will be mailed directly to the patient.

RECOMMENDATION:
Screening mammogram in one year. (Code:[6J])

BI-RADS CATEGORY  1: Negative.

## 2020-10-26 ENCOUNTER — Other Ambulatory Visit: Payer: Self-pay | Admitting: Internal Medicine

## 2020-10-26 MED FILL — LOSARTAN POTASSIUM 50 MG TA: 50 | 30 days supply | Qty: 30 | Fill #0

## 2020-10-28 DIAGNOSIS — M542 Cervicalgia: Secondary | ICD-10-CM | POA: Diagnosis not present

## 2020-10-28 DIAGNOSIS — M75112 Incomplete rotator cuff tear or rupture of left shoulder, not specified as traumatic: Secondary | ICD-10-CM | POA: Diagnosis not present

## 2020-11-02 DIAGNOSIS — M75112 Incomplete rotator cuff tear or rupture of left shoulder, not specified as traumatic: Secondary | ICD-10-CM | POA: Diagnosis not present

## 2020-11-02 DIAGNOSIS — M542 Cervicalgia: Secondary | ICD-10-CM | POA: Diagnosis not present

## 2020-11-03 ENCOUNTER — Other Ambulatory Visit (HOSPITAL_COMMUNITY): Payer: Self-pay

## 2020-11-04 DIAGNOSIS — M75112 Incomplete rotator cuff tear or rupture of left shoulder, not specified as traumatic: Secondary | ICD-10-CM | POA: Diagnosis not present

## 2020-11-04 DIAGNOSIS — M542 Cervicalgia: Secondary | ICD-10-CM | POA: Diagnosis not present

## 2020-11-05 ENCOUNTER — Telehealth: Payer: Self-pay | Admitting: Hematology and Oncology

## 2020-11-05 DIAGNOSIS — M4692 Unspecified inflammatory spondylopathy, cervical region: Secondary | ICD-10-CM | POA: Diagnosis not present

## 2020-11-05 NOTE — Telephone Encounter (Signed)
R/s appts per 3/31 sch msg. Pt aware.  

## 2020-11-12 ENCOUNTER — Other Ambulatory Visit (HOSPITAL_COMMUNITY): Payer: Self-pay

## 2020-11-12 MED FILL — Dasatinib Tab 50 MG: ORAL | 30 days supply | Qty: 30 | Fill #0 | Status: AC

## 2020-11-12 MED FILL — Levothyroxine Sodium Tab 175 MCG: ORAL | 90 days supply | Qty: 90 | Fill #0 | Status: AC

## 2020-11-16 ENCOUNTER — Other Ambulatory Visit (HOSPITAL_COMMUNITY): Payer: Self-pay

## 2020-11-23 ENCOUNTER — Other Ambulatory Visit (HOSPITAL_COMMUNITY): Payer: Self-pay

## 2020-11-23 MED FILL — Losartan Potassium Tab 50 MG: ORAL | 60 days supply | Qty: 60 | Fill #0 | Status: AC

## 2020-12-01 ENCOUNTER — Other Ambulatory Visit (HOSPITAL_COMMUNITY): Payer: Self-pay

## 2020-12-01 ENCOUNTER — Other Ambulatory Visit: Payer: Self-pay | Admitting: Internal Medicine

## 2020-12-01 MED ORDER — ZOLPIDEM TARTRATE 10 MG PO TABS
ORAL_TABLET | Freq: Every evening | ORAL | 0 refills | Status: DC | PRN
  Filled 2020-12-01: qty 90, 90d supply, fill #0

## 2020-12-01 NOTE — Telephone Encounter (Signed)
Last office visit- 06/03/20 Last refill-09/02/20-90 tabs, 0 refills  No future visit scheduled

## 2020-12-02 ENCOUNTER — Other Ambulatory Visit (HOSPITAL_COMMUNITY): Payer: Self-pay

## 2020-12-07 ENCOUNTER — Other Ambulatory Visit: Payer: 59

## 2020-12-09 ENCOUNTER — Other Ambulatory Visit (HOSPITAL_COMMUNITY): Payer: Self-pay

## 2020-12-09 MED FILL — Dasatinib Tab 50 MG: ORAL | 30 days supply | Qty: 30 | Fill #1 | Status: AC

## 2020-12-14 ENCOUNTER — Other Ambulatory Visit (HOSPITAL_COMMUNITY): Payer: Self-pay

## 2020-12-17 ENCOUNTER — Ambulatory Visit: Payer: 59 | Admitting: Hematology and Oncology

## 2020-12-21 ENCOUNTER — Inpatient Hospital Stay: Payer: 59 | Attending: Hematology and Oncology

## 2020-12-21 ENCOUNTER — Other Ambulatory Visit: Payer: Self-pay

## 2020-12-21 DIAGNOSIS — Z79899 Other long term (current) drug therapy: Secondary | ICD-10-CM | POA: Diagnosis not present

## 2020-12-21 DIAGNOSIS — C9210 Chronic myeloid leukemia, BCR/ABL-positive, not having achieved remission: Secondary | ICD-10-CM | POA: Insufficient documentation

## 2020-12-21 LAB — CBC WITH DIFFERENTIAL/PLATELET
Abs Immature Granulocytes: 0.03 10*3/uL (ref 0.00–0.07)
Basophils Absolute: 0.1 10*3/uL (ref 0.0–0.1)
Basophils Relative: 1 %
Eosinophils Absolute: 0.4 10*3/uL (ref 0.0–0.5)
Eosinophils Relative: 6 %
HCT: 37.7 % (ref 36.0–46.0)
Hemoglobin: 12.9 g/dL (ref 12.0–15.0)
Immature Granulocytes: 0 %
Lymphocytes Relative: 25 %
Lymphs Abs: 1.8 10*3/uL (ref 0.7–4.0)
MCH: 30.8 pg (ref 26.0–34.0)
MCHC: 34.2 g/dL (ref 30.0–36.0)
MCV: 90 fL (ref 80.0–100.0)
Monocytes Absolute: 0.9 10*3/uL (ref 0.1–1.0)
Monocytes Relative: 13 %
Neutro Abs: 4 10*3/uL (ref 1.7–7.7)
Neutrophils Relative %: 55 %
Platelets: 239 10*3/uL (ref 150–400)
RBC: 4.19 MIL/uL (ref 3.87–5.11)
RDW: 13.2 % (ref 11.5–15.5)
WBC: 7.2 10*3/uL (ref 4.0–10.5)
nRBC: 0 % (ref 0.0–0.2)

## 2020-12-21 LAB — COMPREHENSIVE METABOLIC PANEL
ALT: 24 U/L (ref 0–44)
AST: 25 U/L (ref 15–41)
Albumin: 3.6 g/dL (ref 3.5–5.0)
Alkaline Phosphatase: 56 U/L (ref 38–126)
Anion gap: 10 (ref 5–15)
BUN: 15 mg/dL (ref 6–20)
CO2: 20 mmol/L — ABNORMAL LOW (ref 22–32)
Calcium: 8.9 mg/dL (ref 8.9–10.3)
Chloride: 107 mmol/L (ref 98–111)
Creatinine, Ser: 0.8 mg/dL (ref 0.44–1.00)
GFR, Estimated: >60 mL/min (ref >60)
Glucose, Bld: 97 mg/dL (ref 70–99)
Potassium: 4 mmol/L (ref 3.5–5.1)
Sodium: 137 mmol/L (ref 135–145)
Total Bilirubin: 0.3 mg/dL (ref 0.3–1.2)
Total Protein: 6.6 g/dL (ref 6.5–8.1)

## 2020-12-25 LAB — BCR/ABL

## 2020-12-31 ENCOUNTER — Inpatient Hospital Stay (HOSPITAL_BASED_OUTPATIENT_CLINIC_OR_DEPARTMENT_OTHER): Payer: 59 | Admitting: Hematology and Oncology

## 2020-12-31 ENCOUNTER — Other Ambulatory Visit: Payer: Self-pay

## 2020-12-31 ENCOUNTER — Telehealth: Payer: Self-pay | Admitting: Hematology and Oncology

## 2020-12-31 ENCOUNTER — Encounter: Payer: Self-pay | Admitting: Hematology and Oncology

## 2020-12-31 DIAGNOSIS — Z299 Encounter for prophylactic measures, unspecified: Secondary | ICD-10-CM | POA: Diagnosis not present

## 2020-12-31 DIAGNOSIS — C9210 Chronic myeloid leukemia, BCR/ABL-positive, not having achieved remission: Secondary | ICD-10-CM

## 2020-12-31 DIAGNOSIS — Z79899 Other long term (current) drug therapy: Secondary | ICD-10-CM | POA: Diagnosis not present

## 2020-12-31 NOTE — Assessment & Plan Note (Signed)
The patient has achieved major molecular response without detectable BCR/ABL transcripts She is comfortable to continue on reduced dose Sprycel 50 mg daily She tolerated treatment very well with no major side effects She will continue the same treatment without dose adjustment.   I will continue to see her every 3 months with blood work to be done ahead of time The patient is educated to watch for signs and symptoms of shortness of breath/fluid retention

## 2020-12-31 NOTE — Assessment & Plan Note (Signed)
We discussed preventive health with vitamin D supplement

## 2020-12-31 NOTE — Progress Notes (Signed)
[facility] OFFICE PROGRESS NOTE  Patient Care Team: Panosh, Standley Brooking, MD as PCP - General [name], MD (Endocrinology) [name], MD (Internal Medicine) Everett Graff, MD as Attending Physician (Obstetrics and Gynecology) Heath Lark, MD as Consulting Physician (Hematology and Oncology) Chesley Mires, MD as Consulting Physician (Pulmonary Disease)  ASSESSMENT & PLAN:  CML (chronic myeloid leukemia) ([place]) The patient has achieved major molecular response without detectable BCR/ABL transcripts She is comfortable to continue on reduced dose Sprycel 50 mg daily She tolerated treatment very well with no major side effects She will continue the same treatment without dose adjustment.   I will continue to see her every 3 months with blood work to be done ahead of time The patient is educated to watch for signs and symptoms of shortness of breath/fluid retention  Preventive measure We discussed preventive health with vitamin D supplement   Orders Placed This Encounter  Procedures  . CBC with Differential/Platelet    Standing Status:   Standing    Number of Occurrences:   22    Standing Expiration Date:   12/31/2021  . Comprehensive metabolic panel    Standing Status:   Standing    Number of Occurrences:   33    Standing Expiration Date:   12/31/2021  . BCR-ABL    With RT-PCR technique    Standing Status:   Standing    Number of Occurrences:   22    Standing Expiration Date:   12/31/2021    All questions were answered. The patient knows to call the clinic with any problems, questions or concerns. The total time spent in the appointment was 20 minutes encounter with patients including review of chart and various tests results, discussions about plan of care and coordination of care plan   Heath Lark, MD 12/31/2020 10:01 AM  INTERVAL HISTORY: Please see below for problem oriented charting. She returns for further follow-up for CML She tolerated  treatment well and denies missing doses No recent infection, fever or chills No signs of fluid retention, shortness of breath or leg swelling She tripped and fell recently and bruised her face but denies other injuries  SUMMARY OF ONCOLOGIC HISTORY: Oncology History  CML (chronic myeloid leukemia) ([place])  05/15/2015 Bone Marrow Biopsy   BM biopsy and FISH confirmed CML [id number]   05/15/2015 - 05/20/2015 Chemotherapy   She started taking Hydrea 1500 mg daily   05/15/2015 Tumor Marker   BCR/ABL b2a2 87.5%, IS 49%   05/21/2015 -  Chemotherapy   She started taking Sprycel 100 mg daily   06/29/2015 Adverse Reaction   She had diarrhea. Dose of Srycel is reduced to 50 mg daily   09/10/2015 Tumor Marker   BCR/ABL b2a2 0.21%, IS 0.1743%   12/11/2015 Pathology Results   BCR/ABL b2a2 0.19%, IS 0.1577%   04/13/2016 Pathology Results   BCR/ABL b2a2 0.29%, IS 0.2407%   07/14/2016 Pathology Results   BCR/ABL b2a2 0.004%, IS 0.0092%. She is in MMR   07/21/2016 Miscellaneous   Dose of Sprycel is reduced to 50 mg daily   09/08/2016 Pathology Results   BCR/ABL b2a2 0.001%, IS 0.0023%. She is in MMR   06/14/2017 Pathology Results   BCR/ABL not detected. She is in MMR   10/11/2017 Pathology Results   BCR/ABL is not detected. She is in MMR   02/14/2018 Pathology Results   BCR/ABL is not detected. She is in MMR   05/17/2018 Pathology Results   BCR/ABL is not detected. She is  in MMR   08/22/2018 Pathology Results   BCR/ABL is not detected. She is in MMR   11/30/2018 Pathology Results   BCR/ABL is not detected. She is in MMR   06/03/2019 Pathology Results   BCR/ABL is not detected. She is in MMR   09/03/2019 Pathology Results   BCR/ABL is not detected. She is in MMR   12/05/2019 Pathology Results   BCR/ABL is not detected. She is in MMR   06/01/2020 Pathology Results   BCR/ABL is not detected. She is in MMR   09/01/2020 Pathology Results   BCR/ABL is not detected. She is in MMR    12/21/2020 Pathology Results   BCR/ABL is not detected. She is in MMR     REVIEW OF SYSTEMS:   Constitutional: Denies fevers, chills or abnormal weight loss Eyes: Denies blurriness of vision Ears, nose, mouth, throat, and face: Denies mucositis or sore throat Respiratory: Denies cough, dyspnea or wheezes Cardiovascular: Denies palpitation, chest discomfort or lower extremity swelling Gastrointestinal:  Denies nausea, heartburn or change in bowel habits Skin: Denies abnormal skin rashes Lymphatics: Denies new lymphadenopathy or easy bruising Neurological:Denies numbness, tingling or new weaknesses Behavioral/Psych: Mood is stable, no new changes  All other systems were reviewed with the patient and are negative.  I have reviewed the past medical history, past surgical history, social history and family history with the patient and they are unchanged from previous note.  ALLERGIES:  has No Known Allergies.  MEDICATIONS:  Current Outpatient Medications  Medication Sig Dispense Refill  . zolpidem (AMBIEN) 10 MG tablet TAKE 1 TABLET BY MOUTH AT BEDTIME AS NEEDED FOR SLEEP 90 tablet 0  . dasatinib (SPRYCEL) 50 MG tablet TAKE 1 TABLET BY MOUTH ONCE DAILY 30 tablet 11  . losartan (COZAAR) 50 MG tablet TAKE 1 TABLET BY MOUTH DAILY. 90 tablet 0  . SYNTHROID 175 MCG tablet TAKE 1 TABLET (175 MCG TOTAL) BY MOUTH DAILY BEFORE BREAKFAST. 90 tablet 2  . verapamil (CALAN-SR) 240 MG CR tablet TAKE 1 TABLET BY MOUTH AT BEDTIME. 90 tablet 3   No current facility-administered medications for this visit.    PHYSICAL EXAMINATION: ECOG PERFORMANCE STATUS: 0 - Asymptomatic  Vitals:   12/31/20 0807  BP: 131/63  Pulse: 69  Resp: 18  Temp: 98.5 F (36.9 C)  SpO2: 99%   Filed Weights   12/31/20 0807  Weight: 163 lb 6.4 oz (74.1 kg)    GENERAL:alert, no distress and comfortable SKIN: Noted minor skin bruising over her face EYES: normal, Conjunctiva are pink and non-injected, sclera  clear OROPHARYNX:no exudate, no erythema and lips, buccal mucosa, and tongue normal  NECK: supple, thyroid normal size, non-tender, without nodularity LYMPH:  no palpable lymphadenopathy in the cervical, axillary or inguinal LUNGS: clear to auscultation and percussion with normal breathing effort HEART: regular rate & rhythm and no murmurs and no lower extremity edema ABDOMEN:abdomen soft, non-tender and normal bowel sounds Musculoskeletal:no cyanosis of digits and no clubbing  NEURO: alert & oriented x 3 with fluent speech, no focal motor/sensory deficits  LABORATORY DATA:  I have reviewed the data as listed    Component Value Date/Time   NA 137 12/21/2020 0738   NA 138 02/07/2017 0836   K 4.0 12/21/2020 0738   K 4.2 02/07/2017 0836   CL 107 12/21/2020 0738   CO2 20 (L) 12/21/2020 0738   CO2 23 02/07/2017 0836   GLUCOSE 97 12/21/2020 0738   GLUCOSE 85 02/07/2017 0836   BUN 15 12/21/2020 0738  BUN 15.5 02/07/2017 0836   CREATININE 0.80 12/21/2020 0738   CREATININE 0.9 02/07/2017 0836   CALCIUM 8.9 12/21/2020 0738   CALCIUM 9.5 02/07/2017 0836   PROT 6.6 12/21/2020 0738   PROT 7.0 02/07/2017 0836   ALBUMIN 3.6 12/21/2020 0738   ALBUMIN 3.8 02/07/2017 0836   AST 25 12/21/2020 0738   AST 25 02/07/2017 0836   ALT 24 12/21/2020 0738   ALT 36 02/07/2017 0836   ALKPHOS 56 12/21/2020 0738   ALKPHOS 55 02/07/2017 0836   BILITOT 0.3 12/21/2020 0738   BILITOT 0.31 02/07/2017 0836   GFRNONAA >60 12/21/2020 0738   GFRAA >60 03/03/2020 0747    No results found for: SPEP, UPEP  Lab Results  Component Value Date   WBC 7.2 12/21/2020   NEUTROABS 4.0 12/21/2020   HGB 12.9 12/21/2020   HCT 37.7 12/21/2020   MCV 90.0 12/21/2020   PLT 239 12/21/2020      Chemistry      Component Value Date/Time   NA 137 12/21/2020 0738   NA 138 02/07/2017 0836   K 4.0 12/21/2020 0738   K 4.2 02/07/2017 0836   CL 107 12/21/2020 0738   CO2 20 (L) 12/21/2020 0738   CO2 23 02/07/2017 0836    BUN 15 12/21/2020 0738   BUN 15.5 02/07/2017 0836   CREATININE 0.80 12/21/2020 0738   CREATININE 0.9 02/07/2017 0836      Component Value Date/Time   CALCIUM 8.9 12/21/2020 0738   CALCIUM 9.5 02/07/2017 0836   ALKPHOS 56 12/21/2020 0738   ALKPHOS 55 02/07/2017 0836   AST 25 12/21/2020 0738   AST 25 02/07/2017 0836   ALT 24 12/21/2020 0738   ALT 36 02/07/2017 0836   BILITOT 0.3 12/21/2020 0738   BILITOT 0.31 02/07/2017 0836

## 2020-12-31 NOTE — Telephone Encounter (Signed)
Scheduled appointment per 05/26 schedule message. Patient is aware.

## 2021-01-01 ENCOUNTER — Other Ambulatory Visit (HOSPITAL_COMMUNITY): Payer: Self-pay

## 2021-01-01 MED FILL — Verapamil HCl Tab ER 240 MG: ORAL | 90 days supply | Qty: 90 | Fill #0 | Status: AC

## 2021-01-06 ENCOUNTER — Other Ambulatory Visit (HOSPITAL_COMMUNITY): Payer: Self-pay

## 2021-01-08 ENCOUNTER — Other Ambulatory Visit (HOSPITAL_COMMUNITY): Payer: Self-pay

## 2021-01-08 MED FILL — Dasatinib Tab 50 MG: ORAL | 30 days supply | Qty: 30 | Fill #2 | Status: AC

## 2021-01-11 ENCOUNTER — Telehealth: Payer: Self-pay

## 2021-01-11 ENCOUNTER — Other Ambulatory Visit (HOSPITAL_COMMUNITY): Payer: Self-pay

## 2021-01-11 NOTE — Telephone Encounter (Signed)
Oral Oncology Patient Advocate Encounter  Received notification from [place] that prior authorization for Sprycel is required.  PA submitted on CoverMyMeds Key [id number] Status is pending  Oral Oncology Clinic will continue to follow.  [place] Patient [place] Phone [phone] Fax [phone] 01/11/2021 9:17 AM

## 2021-01-18 ENCOUNTER — Other Ambulatory Visit: Payer: Self-pay | Admitting: Internal Medicine

## 2021-01-19 ENCOUNTER — Other Ambulatory Visit (HOSPITAL_COMMUNITY): Payer: Self-pay

## 2021-01-19 MED ORDER — LOSARTAN POTASSIUM 50 MG PO TABS
50.0000 mg | ORAL_TABLET | Freq: Every day | ORAL | 0 refills | Status: DC
  Filled 2021-01-19: qty 90, 90d supply, fill #0

## 2021-01-26 NOTE — Telephone Encounter (Signed)
Oral Oncology Patient Advocate Encounter  Prior Authorization for Sprycel has been approved.    PA# [id number] Effective dates: 01/11/21 through 01/11/22  Oral Oncology Clinic will continue to follow.   [place] Patient [place] Phone [phone] Fax [phone] 01/26/2021 11:03 AM

## 2021-02-02 ENCOUNTER — Other Ambulatory Visit (HOSPITAL_COMMUNITY): Payer: Self-pay

## 2021-02-02 MED FILL — Dasatinib Tab 50 MG: ORAL | 30 days supply | Qty: 30 | Fill #3 | Status: AC

## 2021-02-07 MED FILL — Levothyroxine Sodium Tab 175 MCG: ORAL | 90 days supply | Qty: 90 | Fill #1 | Status: AC

## 2021-02-09 ENCOUNTER — Other Ambulatory Visit (HOSPITAL_COMMUNITY): Payer: Self-pay

## 2021-02-25 ENCOUNTER — Encounter: Payer: Self-pay | Admitting: Internal Medicine

## 2021-02-25 ENCOUNTER — Telehealth (INDEPENDENT_AMBULATORY_CARE_PROVIDER_SITE_OTHER): Payer: 59 | Admitting: Internal Medicine

## 2021-02-25 VITALS — Ht 68.0 in | Wt 163.4 lb

## 2021-02-25 DIAGNOSIS — R42 Dizziness and giddiness: Secondary | ICD-10-CM | POA: Diagnosis not present

## 2021-02-25 DIAGNOSIS — S060X0A Concussion without loss of consciousness, initial encounter: Secondary | ICD-10-CM

## 2021-02-25 DIAGNOSIS — Z9889 Other specified postprocedural states: Secondary | ICD-10-CM | POA: Diagnosis not present

## 2021-02-25 NOTE — Progress Notes (Signed)
Virtual Visit via Video Note  I connected withNAME@ on 02/25/21 at  9:30 AM EDT by a video enabled telemedicine application and verified that I am speaking with the correct person using two identifiers. Location patient: home Location provider:home office Persons participating in the virtual visit: patient, provider  [place]th national recommendations  regarding COVID 19 pandemic   video visit is advised over in office visit for this patient.  Patient aware  of the limitations of evaluation and management by telemedicine and  availability of in person appointments. and agreed to proceed.   HPI: Christina [name] presents for video visit because of episodic phono photophobia imbalance when exposed to bright lights and sounds at work ever since she had a closed head injury without loss of consciousness. Third Friday in May was [place] hotel  with family, tripped over daughter's suitcase in the dark when getting up to use the bathroom ,hit left frontal head on a desk chair :had significant swelling, hematoma but no loss of consciousness vision changes.  It took weeks to have the bruising go away.  Never had double vision or focal neurologic signs at that time.  However when going back to work at [employer] where she is exposed to bright lights and loud noises at the same time she has persistent transient imbalance episodes like she is going to fall or pass out.  There is no other associated symptoms and she is not getting any symptoms during any other life activities driving etc. No visual disturbance or auditory disturbance. Of note she has a past history of craniotomy for subdural.  Has had no post injury seizures. 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 No alterations in smell. Or distortion of senses No sig ha  No hx migraines.  ROS: See pertinent positives and negatives per HPI.  Past Medical History:  Diagnosis Date   Allergic rhinitis    Arthritis    Cleft palate    COVID-19 07/31/2019   tested positive, states mild aches/pains, fatigue   Diarrhea due to drug 06/30/2015   Fibroid    Grave's disease    H/O insomnia    H/O menorrhagia 2012   History of chicken pox    Hypertension    Hyperthyroidism    s/p rai 8/06 now hypothyroid Dr. 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 JO   Sleep apnea    has cpap does not use   Vitamin D deficiency     Past Surgical History:  Procedure Laterality Date   BUNIONECTOMY  1986   CRANIOTOMY N/A 06/17/2016   Procedure: RIGHT CRANIOTOMY HEMATOMA EVACUATION SUBDURAL;  Surgeon: Eustace Moore, MD;  Location: [facility];  Service: Neurosurgery;  Laterality: N/A;   CRANIOTOMY Right 06/18/2016   Procedure: CRANIOTOMY HEMATOMA EVACUATION SUBDURAL RIGHT;  Surgeon: Eustace Moore, MD;  Location: [facility];  Service: Neurosurgery;  Laterality: Right;   DILATION AND CURETTAGE OF UTERUS  09/15/2010   HYSTEROSCOPY WITH D & C  09/28/2010   MOUTH SURGERY     NOVASURE  ABLATION  09/28/2010   REFRACTIVE SURGERY     for vision   TONSILLECTOMY  1965    Family History  Problem Relation Age of Onset   Diabetes Mother    Cancer Mother 75       ovarian ca   Breast cancer Mother    Hypertension Other    Prostate cancer Other        grandfather   Hypertension Father    Cancer Maternal Uncle        leukemia   Colon cancer Neg Hx    Esophageal cancer Neg Hx    Rectal cancer Neg Hx    Stomach cancer Neg Hx    Colon polyps Neg Hx     Social History   Tobacco Use   Smoking status: Never   Smokeless tobacco: Never  Substance  Use Topics   Alcohol use: Yes    Comment: socially   Drug use: No      Current Outpatient Medications:    dasatinib (SPRYCEL) 50 MG tablet, TAKE 1 TABLET BY MOUTH ONCE DAILY, Disp: 30 tablet, Rfl: 11   losartan (COZAAR) 50 MG tablet, Take 1 tablet (50 mg total) by mouth daily., Disp: 90 tablet, Rfl: 0   SYNTHROID 175 MCG tablet, TAKE 1 TABLET (175 MCG TOTAL) BY MOUTH DAILY BEFORE BREAKFAST., Disp: 90 tablet, Rfl: 2   verapamil (CALAN-SR) 240 MG CR tablet, TAKE 1 TABLET BY MOUTH AT BEDTIME., Disp: 90 tablet, Rfl: 3   zolpidem (AMBIEN) 10 MG tablet, TAKE 1 TABLET BY MOUTH AT BEDTIME AS NEEDED FOR SLEEP, Disp: 90 tablet, Rfl: 0  EXAM: BP Readings from Last 3 Encounters:  12/31/20 131/63  09/11/20 120/62  06/10/20 120/67    VITALS per patient if applicable:  GENERAL: alert, oriented, appears well and in no acute distress  HEENT: atraumatic, conjunttiva clear, no obvious abnormalities on inspection of external nose and ears  NECK: normal movements of the head and neck  LUNGS: on inspection no signs of respiratory distress, breathing rate appears normal, no obvious gross SOB, gasping or wheezing  CV: no obvious cyanosis  MS: moves all visible extremities without noticeable abnormality  PSYCH/NEURO: pleasant and cooperative, no obvious depression or anxiety, speech and thought processing grossly intact Husband physician  has reported no change in gait of noted physical findings and no other findings he has noted  Lab Results  Component Value Date   WBC 7.2 12/21/2020   HGB 12.9 12/21/2020   HCT 37.7 12/21/2020   PLT 239 12/21/2020   GLUCOSE 97 12/21/2020   CHOL 199 10/29/2019   TRIG 113.0 10/29/2019   HDL 53.50 10/29/2019   LDLDIRECT 162.0 04/24/2019   LDLCALC 123 (H) 10/29/2019   ALT 24 12/21/2020   AST 25 12/21/2020   NA 137 12/21/2020   K 4.0 12/21/2020   CL 107 12/21/2020   CREATININE 0.80 12/21/2020   BUN 15 12/21/2020   CO2 20 (L) 12/21/2020   TSH 3.57  06/03/2020   INR 0.90 06/17/2016   HGBA1C 5.6 04/24/2019    ASSESSMENT AND PLAN:  Discussed the following assessment and plan:    ICD-10-CM   1. Closed head injury with concussion, without loss of consciousness, initial encounter  S06.0X0A CT Head Wo Contrast   see text  around May 20    2. Episodes of imbalance photo phonophobia  R42 CT Head Wo Contrast   brief seconds  triggered by bright lights and loud sounds at work setting only. Feels has  to hold on at those time.     3. S/P craniotomy  Z98.890 CT Head Wo Contrast    Suspect post closed head injury related symptoms concussive type triggered by significant sensory stimuli with very brief ..could be a variant of post concussive migraine. Less likely seizure ..limited only to work environ and lasting seconds not minutes.and no other neuro findings  Plan ct head   and observe   if  persistent or progressive would involve neuro consult  other evaluation.  Counseled.   Expectant management and discussion of plan and treatment with opportunity to ask questions and all were answered. The patient agreed with the plan and demonstrated an understanding of the instructions.   Advised to call back or seek an in-person evaluation if worsening  or having  further concerns . Return for depending on results and how doing. Shanon Ace, MD

## 2021-03-01 ENCOUNTER — Other Ambulatory Visit: Payer: Self-pay | Admitting: Internal Medicine

## 2021-03-01 ENCOUNTER — Other Ambulatory Visit (HOSPITAL_COMMUNITY): Payer: Self-pay

## 2021-03-02 ENCOUNTER — Other Ambulatory Visit (HOSPITAL_COMMUNITY): Payer: Self-pay

## 2021-03-02 MED ORDER — ZOLPIDEM TARTRATE 10 MG PO TABS
ORAL_TABLET | Freq: Every evening | ORAL | 0 refills | Status: DC | PRN
  Filled 2021-03-02: qty 90, 90d supply, fill #0

## 2021-03-03 ENCOUNTER — Other Ambulatory Visit (HOSPITAL_COMMUNITY): Payer: Self-pay

## 2021-03-04 ENCOUNTER — Other Ambulatory Visit (HOSPITAL_COMMUNITY): Payer: Self-pay

## 2021-03-09 ENCOUNTER — Other Ambulatory Visit (HOSPITAL_COMMUNITY): Payer: Self-pay

## 2021-03-09 MED FILL — Dasatinib Tab 50 MG: ORAL | 30 days supply | Qty: 30 | Fill #4 | Status: AC

## 2021-03-10 ENCOUNTER — Other Ambulatory Visit: Payer: 59

## 2021-03-10 ENCOUNTER — Other Ambulatory Visit (HOSPITAL_COMMUNITY): Payer: Self-pay

## 2021-03-10 DIAGNOSIS — D2371 Other benign neoplasm of skin of right lower limb, including hip: Secondary | ICD-10-CM | POA: Diagnosis not present

## 2021-03-10 DIAGNOSIS — D2362 Other benign neoplasm of skin of left upper limb, including shoulder: Secondary | ICD-10-CM | POA: Diagnosis not present

## 2021-03-10 DIAGNOSIS — L719 Rosacea, unspecified: Secondary | ICD-10-CM | POA: Diagnosis not present

## 2021-03-10 MED ORDER — METRONIDAZOLE 0.75 % EX CREA
1.0000 "application " | TOPICAL_CREAM | Freq: Every day | CUTANEOUS | 3 refills | Status: DC
  Filled 2021-03-10: qty 45, 30d supply, fill #0

## 2021-03-11 ENCOUNTER — Other Ambulatory Visit (HOSPITAL_COMMUNITY): Payer: Self-pay

## 2021-03-18 ENCOUNTER — Other Ambulatory Visit: Payer: 59

## 2021-03-24 ENCOUNTER — Other Ambulatory Visit: Payer: 59

## 2021-03-28 MED FILL — Verapamil HCl Tab ER 240 MG: ORAL | 90 days supply | Qty: 90 | Fill #1 | Status: AC

## 2021-03-29 ENCOUNTER — Other Ambulatory Visit (HOSPITAL_COMMUNITY): Payer: Self-pay

## 2021-04-02 ENCOUNTER — Other Ambulatory Visit (HOSPITAL_COMMUNITY): Payer: Self-pay

## 2021-04-02 ENCOUNTER — Ambulatory Visit: Payer: 59 | Admitting: Hematology and Oncology

## 2021-04-05 ENCOUNTER — Other Ambulatory Visit (HOSPITAL_COMMUNITY): Payer: Self-pay

## 2021-04-05 ENCOUNTER — Other Ambulatory Visit: Payer: 59

## 2021-04-05 MED FILL — Dasatinib Tab 50 MG: ORAL | 30 days supply | Qty: 30 | Fill #5 | Status: AC

## 2021-04-06 ENCOUNTER — Other Ambulatory Visit: Payer: Self-pay

## 2021-04-06 ENCOUNTER — Inpatient Hospital Stay: Payer: 59 | Attending: Hematology and Oncology

## 2021-04-06 DIAGNOSIS — C9210 Chronic myeloid leukemia, BCR/ABL-positive, not having achieved remission: Secondary | ICD-10-CM | POA: Diagnosis not present

## 2021-04-06 DIAGNOSIS — Z79899 Other long term (current) drug therapy: Secondary | ICD-10-CM | POA: Diagnosis not present

## 2021-04-06 LAB — COMPREHENSIVE METABOLIC PANEL WITH GFR
ALT: 22 U/L (ref 0–44)
AST: 19 U/L (ref 15–41)
Albumin: 3.9 g/dL (ref 3.5–5.0)
Alkaline Phosphatase: 47 U/L (ref 38–126)
Anion gap: 9 (ref 5–15)
BUN: 28 mg/dL — ABNORMAL HIGH (ref 6–20)
CO2: 23 mmol/L (ref 22–32)
Calcium: 9.6 mg/dL (ref 8.9–10.3)
Chloride: 105 mmol/L (ref 98–111)
Creatinine, Ser: 0.87 mg/dL (ref 0.44–1.00)
GFR, Estimated: >60 mL/min
Glucose, Bld: 84 mg/dL (ref 70–99)
Potassium: 4.2 mmol/L (ref 3.5–5.1)
Sodium: 137 mmol/L (ref 135–145)
Total Bilirubin: 0.4 mg/dL (ref 0.3–1.2)
Total Protein: 7.1 g/dL (ref 6.5–8.1)

## 2021-04-06 LAB — CBC WITH DIFFERENTIAL/PLATELET
Abs Immature Granulocytes: 0.01 10*3/uL (ref 0.00–0.07)
Basophils Absolute: 0.1 10*3/uL (ref 0.0–0.1)
Basophils Relative: 1 %
Eosinophils Absolute: 0.3 10*3/uL (ref 0.0–0.5)
Eosinophils Relative: 6 %
HCT: 38.7 % (ref 36.0–46.0)
Hemoglobin: 13.4 g/dL (ref 12.0–15.0)
Immature Granulocytes: 0 %
Lymphocytes Relative: 26 %
Lymphs Abs: 1.2 10*3/uL (ref 0.7–4.0)
MCH: 31.2 pg (ref 26.0–34.0)
MCHC: 34.6 g/dL (ref 30.0–36.0)
MCV: 90 fL (ref 80.0–100.0)
Monocytes Absolute: 0.5 10*3/uL (ref 0.1–1.0)
Monocytes Relative: 10 %
Neutro Abs: 2.6 10*3/uL (ref 1.7–7.7)
Neutrophils Relative %: 57 %
Platelets: 287 10*3/uL (ref 150–400)
RBC: 4.3 MIL/uL (ref 3.87–5.11)
RDW: 13 % (ref 11.5–15.5)
WBC: 4.6 10*3/uL (ref 4.0–10.5)
nRBC: 0 % (ref 0.0–0.2)

## 2021-04-07 ENCOUNTER — Other Ambulatory Visit (HOSPITAL_COMMUNITY): Payer: Self-pay

## 2021-04-08 ENCOUNTER — Ambulatory Visit
Admission: RE | Admit: 2021-04-08 | Discharge: 2021-04-08 | Disposition: A | Discharge status: Home / Self Care | Payer: 59 | Source: Ambulatory Visit | Attending: Internal Medicine | Admitting: Internal Medicine

## 2021-04-08 DIAGNOSIS — R42 Dizziness and giddiness: Secondary | ICD-10-CM

## 2021-04-08 DIAGNOSIS — S060X0A Concussion without loss of consciousness, initial encounter: Secondary | ICD-10-CM

## 2021-04-08 DIAGNOSIS — Z9889 Other specified postprocedural states: Secondary | ICD-10-CM

## 2021-04-08 IMAGING — CT CT HEAD W/O CM
4 series · 16 of 47 positions shown, 18 images · non-contrast
Comparison: None.

CLINICAL DATA: Head trauma, skull fracture and hematoma

EXAM:
CT HEAD WITHOUT CONTRAST
TECHNIQUE: Contiguous axial images were obtained from the base of the skull
through the vertex without intravenous contrast.

[Series 2: head 5.00 hr40 s3 axial ibhc · axial · 0.43mm/px · z∈[-576,-451]mm · 7 of 35 slices shown, 9 images]
[im 5/35  brain]
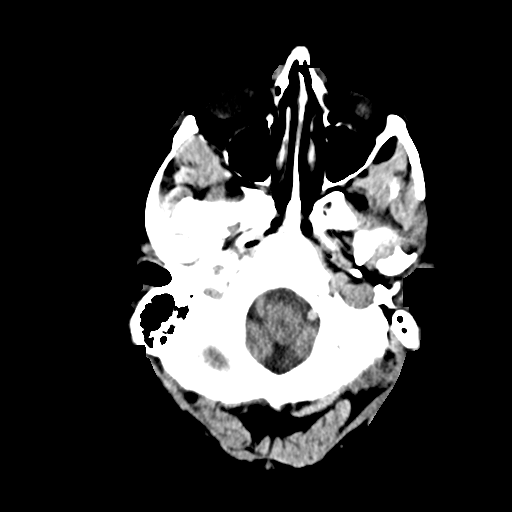
[im 5/35  bone]
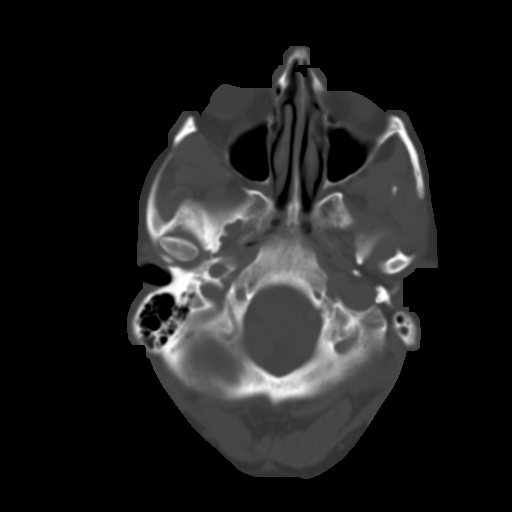
[im 9/35  brain]
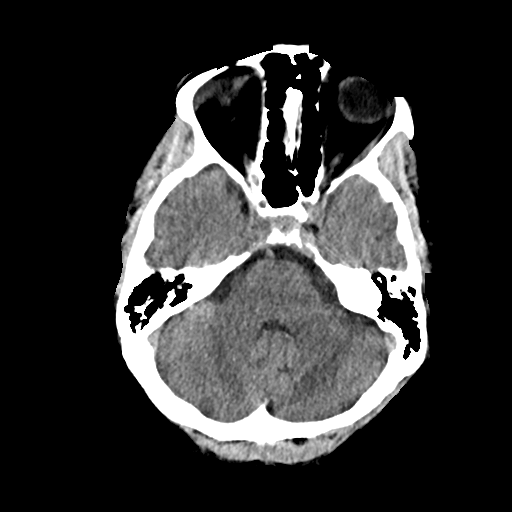
[im 13/35  brain]
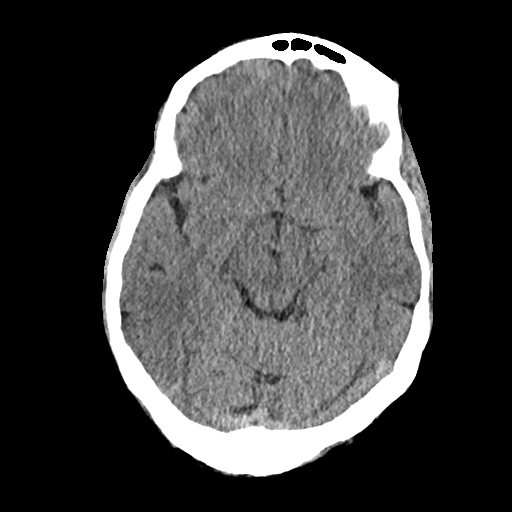
[im 18/35  brain]
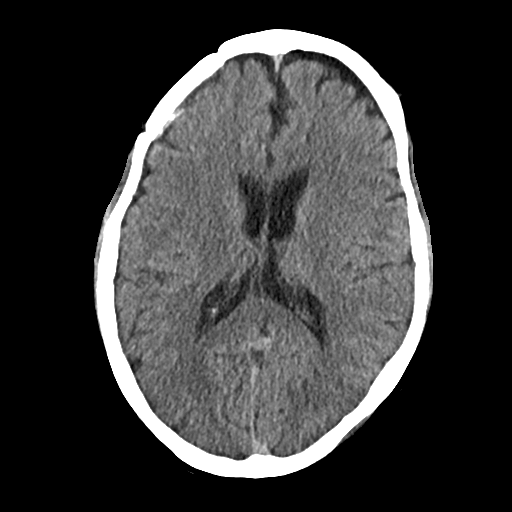
[im 22/35  brain]
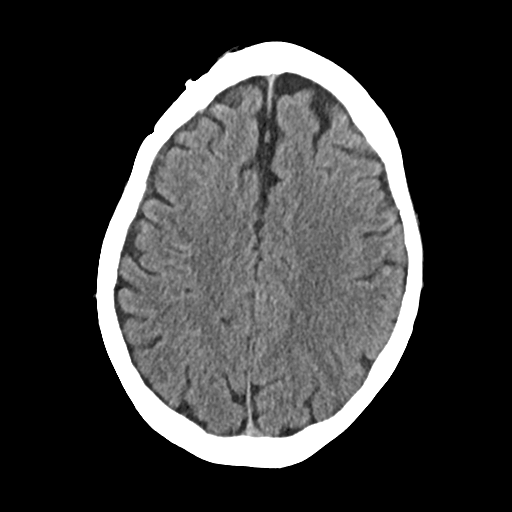
[im 22/35  bone]
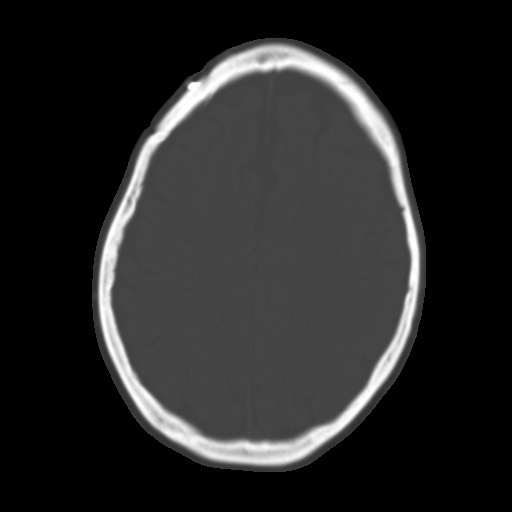
[im 26/35  brain]
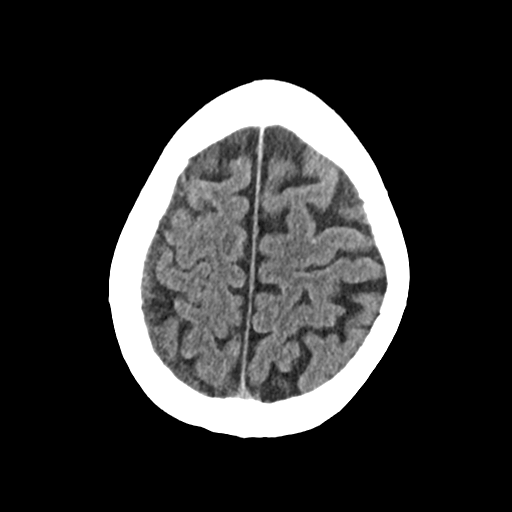
[im 30/35  brain]
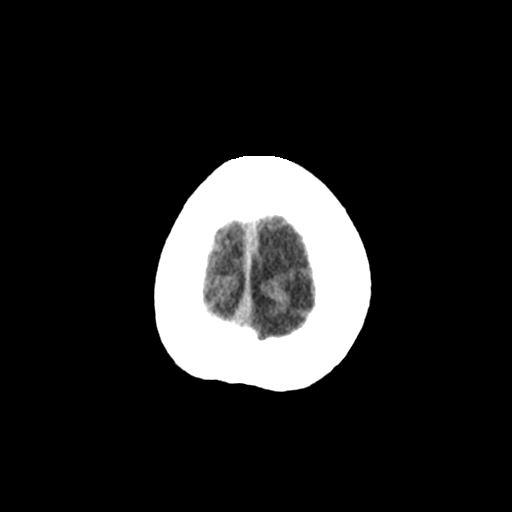

[Series 3: head 2.00 hr60 s3 axial bone · axial · 0.43mm/px · z∈[-582,-546]mm · 3 of 88 slices shown]
[im 9/88  bone]
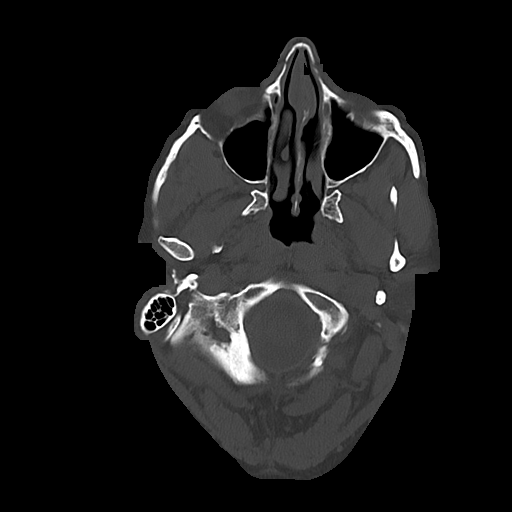
[im 18/88  bone]
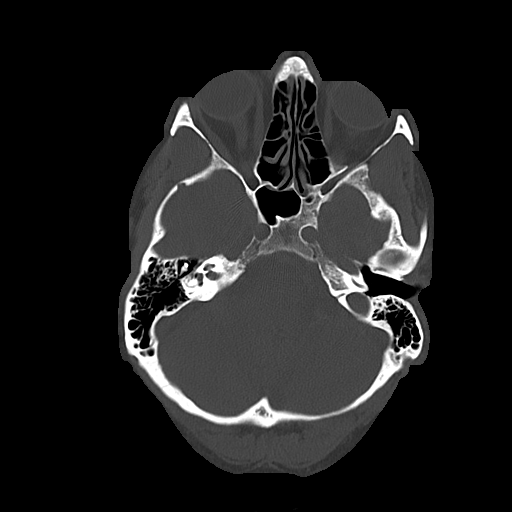
[im 27/88  bone]
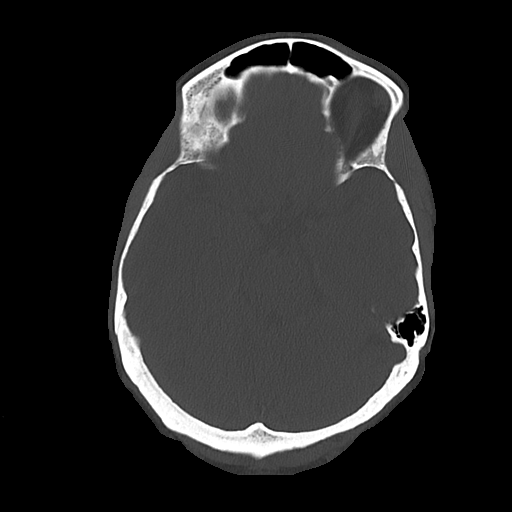

[Series 4: head 3.00 hr40 s3 sag · sagittal · 0.35mm/px · 3 of 73 slices shown]
[im 25/73  brain]
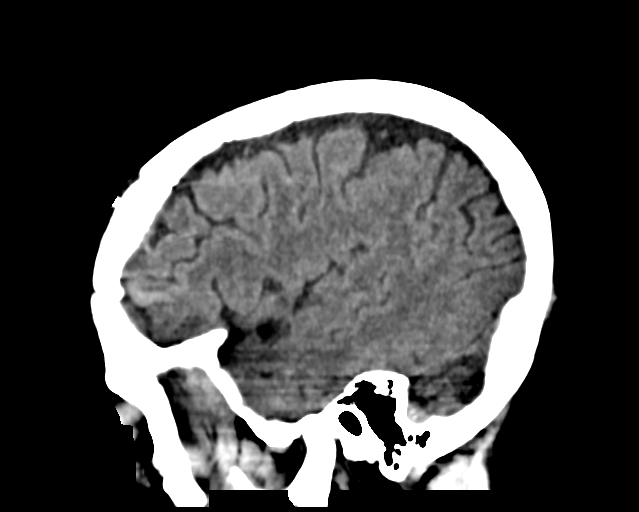
[im 37/73  brain]
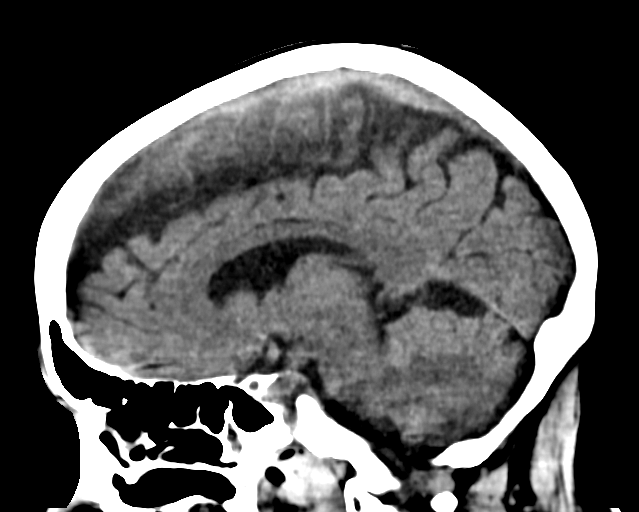
[im 49/73  brain]
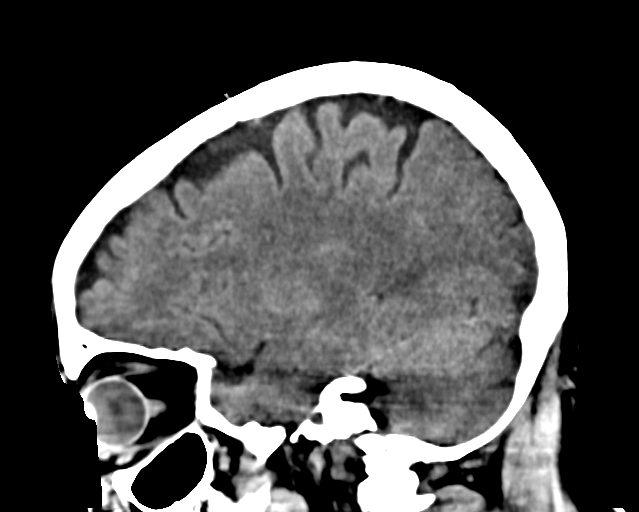

[Series 6: head 3.00 hr40 s3 cor · coronal · 0.35mm/px · 3 of 73 slices shown]
[im 25/73  brain]
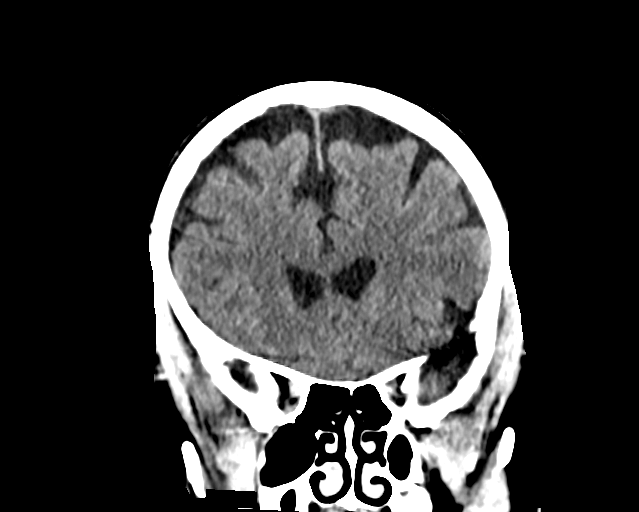
[im 33/73  brain]
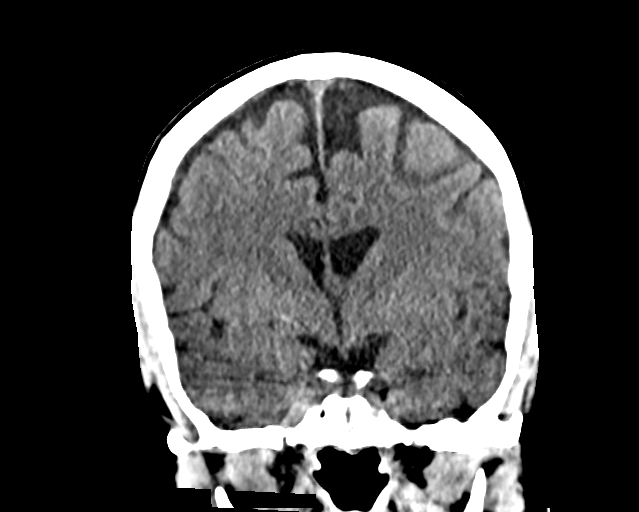
[im 41/73  brain]
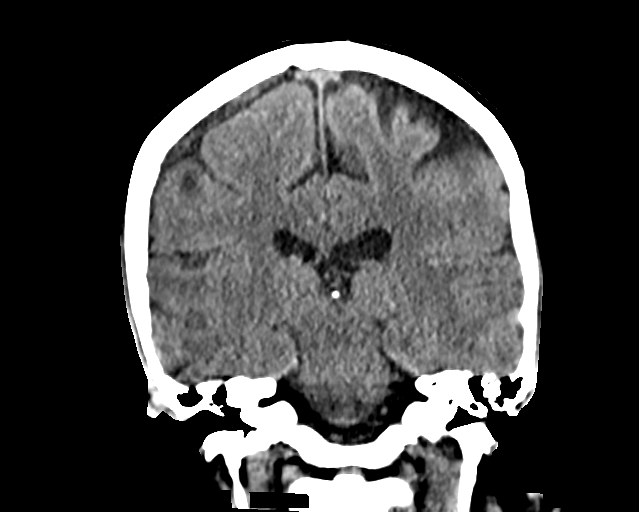

[16 of 47 positions shown; findings below may reference images not displayed]

FINDINGS: Brain: No acute intracranial hemorrhage. No focal mass lesion. No CT
evidence of acute infarction. No midline shift or mass effect. No
hydrocephalus. Basilar cisterns are patent.

Vascular: No hyperdense vessel or unexpected calcification.

Skull: Plate fixation over the RIGHT frontal bone. No associated
extra-axial collections.

Sinuses/Orbits: Paranasal sinuses and mastoid air cells are clear.
Orbits are clear.

Other: None.
IMPRESSION: No intracranial findings.

Plate fixation over the RIGHT frontal bone.

## 2021-04-08 NOTE — Progress Notes (Signed)
No acute findings  .  No sinusitis

## 2021-04-13 ENCOUNTER — Telehealth: Payer: Self-pay

## 2021-04-13 DIAGNOSIS — R519 Headache, unspecified: Secondary | ICD-10-CM

## 2021-04-13 DIAGNOSIS — Z87828 Personal history of other (healed) physical injury and trauma: Secondary | ICD-10-CM

## 2021-04-13 NOTE — Telephone Encounter (Signed)
Per pt request the pt would like to be referred to Dr. Tomi Likens with Nerology for headaches.

## 2021-04-13 NOTE — Telephone Encounter (Signed)
Yes place referral to dr Tomi Likens dx new headaches  (hx of  head injury  and subdural in past)

## 2021-04-13 NOTE — Addendum Note (Signed)
Addended by: Nilda Riggs on: 04/13/2021 11:12 AM   Modules accepted: Orders

## 2021-04-13 NOTE — Addendum Note (Signed)
Addended by: Rosalee Kaufman L on: 04/13/2021 11:13 AM   Modules accepted: Orders

## 2021-04-13 NOTE — Telephone Encounter (Signed)
Referral has been placed. 

## 2021-04-15 LAB — BCR/ABL

## 2021-04-16 ENCOUNTER — Other Ambulatory Visit: Payer: Self-pay

## 2021-04-16 ENCOUNTER — Encounter: Payer: Self-pay | Admitting: Hematology and Oncology

## 2021-04-16 ENCOUNTER — Inpatient Hospital Stay: Payer: 59 | Attending: Hematology and Oncology | Admitting: Hematology and Oncology

## 2021-04-16 DIAGNOSIS — Z79899 Other long term (current) drug therapy: Secondary | ICD-10-CM | POA: Diagnosis not present

## 2021-04-16 DIAGNOSIS — R519 Headache, unspecified: Secondary | ICD-10-CM

## 2021-04-16 DIAGNOSIS — C9210 Chronic myeloid leukemia, BCR/ABL-positive, not having achieved remission: Secondary | ICD-10-CM

## 2021-04-16 NOTE — Assessment & Plan Note (Signed)
The patient has achieved major molecular response without detectable BCR/ABL transcripts She is comfortable to continue on reduced dose Sprycel 50 mg daily She tolerated treatment very well with no major side effects She will continue the same treatment without dose adjustment.   I will continue to see her every 3 months with blood work to be done ahead of time The patient is educated to watch for signs and symptoms of shortness of breath/fluid retention

## 2021-04-16 NOTE — Assessment & Plan Note (Signed)
She had recent CT head done, imaging showed nothing new to explain her headaches I do not believe Sprycel is the cause She is already on verapamil She is schedule to see Dr. Tomi Likens next week

## 2021-04-16 NOTE — Progress Notes (Signed)
[name] OFFICE PROGRESS NOTE  Patient Care Team: Panosh, Standley Brooking, MD as PCP - General [name], MD (Endocrinology) Christina Bailey, MD (Internal Medicine) Everett Graff, MD as Attending Physician (Obstetrics and Gynecology) Christina Lark, MD as Consulting Physician (Hematology and Oncology) Chesley Mires, MD as Consulting Physician (Pulmonary Disease)  ASSESSMENT & PLAN:  CML (chronic myeloid leukemia) ([place]) The patient has achieved major molecular response without detectable BCR/ABL transcripts She is comfortable to continue on reduced dose Sprycel 50 mg daily She tolerated treatment very well with no major side effects She will continue the same treatment without dose adjustment.   I will continue to see her every 3 months with blood work to be done ahead of time The patient is educated to watch for signs and symptoms of shortness of breath/fluid retention  Increased severity of headaches She had recent CT head done, imaging showed nothing new to explain her headaches I do not believe Sprycel is the cause She is already on verapamil She is schedule to see Dr. Tomi Likens next week  No orders of the defined types were placed in this encounter.   All questions were answered. The patient knows to call the clinic with any problems, questions or concerns. The total time spent in the appointment was 20 minutes encounter with patients including review of chart and various tests results, discussions about plan of care and coordination of care plan   Christina Lark, MD 04/16/2021 8:42 AM  INTERVAL HISTORY: Please see below for problem oriented charting. she returns for treatment follow-up, on Sprycel for CML She is doing well She has been complaining of intermittent headaches, slightly worse recently She had CT imaging of the head done recently without anything new to suggest the cause of her headaches She is scheduled to see a neurologist next week In the meantime,  she denies recent infection She have no difficulties obtaining her supply of Sprycel Denies any shortness of breath or fluid retention  REVIEW OF SYSTEMS:   Constitutional: Denies fevers, chills or abnormal weight loss Eyes: Denies blurriness of vision Ears, nose, mouth, throat, and face: Denies mucositis or sore throat Respiratory: Denies cough, dyspnea or wheezes Cardiovascular: Denies palpitation, chest discomfort or lower extremity swelling Gastrointestinal:  Denies nausea, heartburn or change in bowel habits Skin: Denies abnormal skin rashes Lymphatics: Denies new lymphadenopathy or easy bruising Neurological:Denies numbness, tingling or new weaknesses Behavioral/Psych: Mood is stable, no new changes  All other systems were reviewed with the patient and are negative.  I have reviewed the past medical history, past surgical history, social history and family history with the patient and they are unchanged from previous note.  ALLERGIES:  has No Known Allergies.  MEDICATIONS:  Current Outpatient Medications  Medication Sig Dispense Refill   dasatinib (SPRYCEL) 50 MG tablet TAKE 1 TABLET BY MOUTH ONCE DAILY 30 tablet 11   losartan (COZAAR) 50 MG tablet Take 1 tablet (50 mg total) by mouth daily. 90 tablet 0   metroNIDAZOLE (METROCREAM) 0.75 % cream Apply 1 application topically daily. 45 g 3   SYNTHROID 175 MCG tablet TAKE 1 TABLET (175 MCG TOTAL) BY MOUTH DAILY BEFORE BREAKFAST. 90 tablet 2   verapamil (CALAN-SR) 240 MG CR tablet TAKE 1 TABLET BY MOUTH AT BEDTIME. 90 tablet 3   zolpidem (AMBIEN) 10 MG tablet TAKE 1 TABLET BY MOUTH AT BEDTIME AS NEEDED FOR SLEEP 90 tablet 0   No current facility-administered medications for this visit.    SUMMARY OF ONCOLOGIC HISTORY:  Oncology History  CML (chronic myeloid leukemia) ([place])  05/15/2015 Bone Marrow Biopsy   BM biopsy and FISH confirmed CML [id number]   05/15/2015 - 05/20/2015 Chemotherapy   She started taking Hydrea 1500 mg  daily   05/15/2015 Tumor Marker   BCR/ABL b2a2 87.5%, IS 49%   05/21/2015 -  Chemotherapy   She started taking Sprycel 100 mg daily   06/29/2015 Adverse Reaction   She had diarrhea. Dose of Srycel is reduced to 50 mg daily   09/10/2015 Tumor Marker   BCR/ABL b2a2 0.21%, IS 0.1743%   12/11/2015 Pathology Results   BCR/ABL b2a2 0.19%, IS 0.1577%   04/13/2016 Pathology Results   BCR/ABL b2a2 0.29%, IS 0.2407%   07/14/2016 Pathology Results   BCR/ABL b2a2 0.004%, IS 0.0092%. She is in MMR   07/21/2016 Miscellaneous   Dose of Sprycel is reduced to 50 mg daily   09/08/2016 Pathology Results   BCR/ABL b2a2 0.001%, IS 0.0023%. She is in MMR   06/14/2017 Pathology Results   BCR/ABL not detected. She is in MMR   10/11/2017 Pathology Results   BCR/ABL is not detected. She is in MMR   02/14/2018 Pathology Results   BCR/ABL is not detected. She is in MMR   05/17/2018 Pathology Results   BCR/ABL is not detected. She is in MMR   08/22/2018 Pathology Results   BCR/ABL is not detected. She is in MMR   11/30/2018 Pathology Results   BCR/ABL is not detected. She is in MMR   06/03/2019 Pathology Results   BCR/ABL is not detected. She is in MMR   09/03/2019 Pathology Results   BCR/ABL is not detected. She is in MMR   12/05/2019 Pathology Results   BCR/ABL is not detected. She is in MMR   06/01/2020 Pathology Results   BCR/ABL is not detected. She is in MMR   09/01/2020 Pathology Results   BCR/ABL is not detected. She is in MMR   12/21/2020 Pathology Results   BCR/ABL is not detected. She is in MMR   04/06/2021 Pathology Results   BCR/ABL is not detected. She is in MMR     PHYSICAL EXAMINATION: ECOG PERFORMANCE STATUS: 1 - Symptomatic but completely ambulatory  Vitals:   04/16/21 0821  BP: 117/67  Pulse: 64  Resp: 18  Temp: 98.2 F (36.8 C)  SpO2: 100%   Filed Weights   04/16/21 0821  Weight: 163 lb 12.8 oz (74.3 kg)    GENERAL:alert, no distress and comfortable SKIN:  skin color, texture, turgor are normal, no rashes or significant lesions EYES: normal, Conjunctiva are pink and non-injected, sclera clear OROPHARYNX:no exudate, no erythema and lips, buccal mucosa, and tongue normal  NECK: supple, thyroid normal size, non-tender, without nodularity LYMPH:  no palpable lymphadenopathy in the cervical, axillary or inguinal LUNGS: clear to auscultation and percussion with normal breathing effort HEART: regular rate & rhythm and no murmurs and no lower extremity edema ABDOMEN:abdomen soft, non-tender and normal bowel sounds Musculoskeletal:no cyanosis of digits and no clubbing  NEURO: alert & oriented x 3 with fluent speech, no focal motor/sensory deficits  LABORATORY DATA:  I have reviewed the data as listed    Component Value Date/Time   NA 137 04/06/2021 0741   NA 138 02/07/2017 0836   K 4.2 04/06/2021 0741   K 4.2 02/07/2017 0836   CL 105 04/06/2021 0741   CO2 23 04/06/2021 0741   CO2 23 02/07/2017 0836   GLUCOSE 84 04/06/2021 0741   GLUCOSE 85 02/07/2017 0836  BUN 28 (H) 04/06/2021 0741   BUN 15.5 02/07/2017 0836   CREATININE 0.87 04/06/2021 0741   CREATININE 0.9 02/07/2017 0836   CALCIUM 9.6 04/06/2021 0741   CALCIUM 9.5 02/07/2017 0836   PROT 7.1 04/06/2021 0741   PROT 7.0 02/07/2017 0836   ALBUMIN 3.9 04/06/2021 0741   ALBUMIN 3.8 02/07/2017 0836   AST 19 04/06/2021 0741   AST 25 02/07/2017 0836   ALT 22 04/06/2021 0741   ALT 36 02/07/2017 0836   ALKPHOS 47 04/06/2021 0741   ALKPHOS 55 02/07/2017 0836   BILITOT 0.4 04/06/2021 0741   BILITOT 0.31 02/07/2017 0836   GFRNONAA >60 04/06/2021 0741   GFRAA >60 03/03/2020 0747    No results found for: SPEP, UPEP  Lab Results  Component Value Date   WBC 4.6 04/06/2021   NEUTROABS 2.6 04/06/2021   HGB 13.4 04/06/2021   HCT 38.7 04/06/2021   MCV 90.0 04/06/2021   PLT 287 04/06/2021      Chemistry      Component Value Date/Time   NA 137 04/06/2021 0741   NA 138 02/07/2017 0836    K 4.2 04/06/2021 0741   K 4.2 02/07/2017 0836   CL 105 04/06/2021 0741   CO2 23 04/06/2021 0741   CO2 23 02/07/2017 0836   BUN 28 (H) 04/06/2021 0741   BUN 15.5 02/07/2017 0836   CREATININE 0.87 04/06/2021 0741   CREATININE 0.9 02/07/2017 0836      Component Value Date/Time   CALCIUM 9.6 04/06/2021 0741   CALCIUM 9.5 02/07/2017 0836   ALKPHOS 47 04/06/2021 0741   ALKPHOS 55 02/07/2017 0836   AST 19 04/06/2021 0741   AST 25 02/07/2017 0836   ALT 22 04/06/2021 0741   ALT 36 02/07/2017 0836   BILITOT 0.4 04/06/2021 0741   BILITOT 0.31 02/07/2017 0836       RADIOGRAPHIC STUDIES: I have personally reviewed the radiological images as listed and agreed with the findings in the report. CT Head Wo Contrast  Result Date: 04/08/2021 CLINICAL DATA:  Head trauma, skull fracture and hematoma EXAM: CT HEAD WITHOUT CONTRAST TECHNIQUE: Contiguous axial images were obtained from the base of the skull through the vertex without intravenous contrast. COMPARISON:  None. FINDINGS: Brain: No acute intracranial hemorrhage. No focal mass lesion. No CT evidence of acute infarction. No midline shift or mass effect. No hydrocephalus. Basilar cisterns are patent. Vascular: No hyperdense vessel or unexpected calcification. Skull: Plate fixation over the RIGHT frontal bone. No associated extra-axial collections. Sinuses/Orbits: Paranasal sinuses and mastoid air cells are clear. Orbits are clear. Other: None. IMPRESSION: No intracranial findings. Plate fixation over the RIGHT frontal bone. Electronically Signed   By: Suzy Bouchard M.D.   On: 04/08/2021 12:18

## 2021-04-18 NOTE — Progress Notes (Signed)
NEUROLOGY CONSULTATION NOTE  Christina [name] MRN: [id number] DOB: July 28, 1961  Referring provider: Self-referral Primary care provider: Shanon Ace, MD  Reason for consult:  headache  Assessment/Plan:   New-onset headache - significantly increased with valsalva (coughing) or bending over - CT head does not reveal bleeding, tumors or Chiari malformation but I would like to evaluate for cerebral aneurysm or findings of intracranial hypotension.  MRI of brain with and without contrast and MRA of head.  Due to claustrophobia, will provide Valium (must need driver to and from MRI). Start nortriptyline '10mg'$  at bedtime.  We can increase to '25mg'$  at bedtime in 4 weeks if needed. Limit use of pain relievers to no more than 2 days out of week to prevent risk of rebound or medication-overuse headache. Keep headache diary Further recommendations pending results of MRI.  Otherwise, follow up in 3 months.   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  History supplemented by PCP note.   She has a history of medication-related non-traumatic subdural hematoma for which she underwent right craniotomy in November 2017.    She sustained a head injury in May 2022 when she tripped over a suitcase while walking in the dark at night to use the bathroom and striking the left forehead on the table.  No loss of consciousness, headache, nausea, vomiting or vision changes.  Sustained external hematoma and swelling.  She felt fine until July when she developed increased photophobia and phonophobia.  In August, she developed a new onset headache.  She describes a moderate bifrontal head pressure that becomes severe when coughing or bending over.  Lasts about 30 minutes and returns to baseline moderate headache.  No nausea, vomiting, or visual disturbance.  Seems to be worse at night but cannot say that it is worse or better when supine or  upright.  CT head on 04/08/2021 personally reviewed showed plate fixation over the right frontal bone but no acute intracranial abnormalities.  She has been treating with Advil daily.     PAST MEDICAL HISTORY: Past Medical History:  Diagnosis Date   Allergic rhinitis    Arthritis    Cleft palate    COVID-19 07/31/2019   tested positive, states mild aches/pains, fatigue   Diarrhea due to drug 06/30/2015   Fibroid    Grave's disease    H/O insomnia    H/O menorrhagia 2012   History of chicken pox    Hypertension    Hyperthyroidism    s/p rai 8/06 now hypothyroid Dr. 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 JO   Sleep apnea    has cpap does not use   Vitamin D deficiency     PAST SURGICAL HISTORY: Past Surgical History:  Procedure Laterality Date   BUNIONECTOMY  1986   CRANIOTOMY N/A 06/17/2016   Procedure: RIGHT CRANIOTOMY HEMATOMA EVACUATION SUBDURAL;  Surgeon: Eustace Moore, MD;  Location: [facility];  Service: Neurosurgery;  Laterality: N/A;   CRANIOTOMY Right 06/18/2016   Procedure: CRANIOTOMY HEMATOMA EVACUATION SUBDURAL RIGHT;  Surgeon: Eustace Moore, MD;  Location: [facility];  Service: Neurosurgery;  Laterality: Right;   DILATION AND CURETTAGE OF UTERUS  09/15/2010   HYSTEROSCOPY WITH D &  C  09/28/2010   MOUTH SURGERY     NOVASURE ABLATION  09/28/2010   REFRACTIVE SURGERY     for vision   TONSILLECTOMY  1965    MEDICATIONS: Current Outpatient Medications on File Prior to Visit  Medication Sig Dispense Refill   dasatinib (SPRYCEL) 50 MG tablet TAKE 1 TABLET BY MOUTH ONCE DAILY 30 tablet 11   losartan (COZAAR) 50 MG tablet Take 1 tablet (50 mg total) by mouth daily. 90 tablet 0   metroNIDAZOLE (METROCREAM) 0.75 % cream Apply 1 application topically daily. 45 g 3   SYNTHROID 175 MCG tablet TAKE 1 TABLET  (175 MCG TOTAL) BY MOUTH DAILY BEFORE BREAKFAST. 90 tablet 2   verapamil (CALAN-SR) 240 MG CR tablet TAKE 1 TABLET BY MOUTH AT BEDTIME. 90 tablet 3   zolpidem (AMBIEN) 10 MG tablet TAKE 1 TABLET BY MOUTH AT BEDTIME AS NEEDED FOR SLEEP 90 tablet 0   No current facility-administered medications on file prior to visit.    ALLERGIES: No Known Allergies  FAMILY HISTORY: Family History  Problem Relation Age of Onset   Diabetes Mother    Cancer Mother 87       ovarian ca   Breast cancer Mother    Hypertension Other    Prostate cancer Other        grandfather   Hypertension Father    Cancer Maternal Uncle        leukemia   Colon cancer Neg Hx    Esophageal cancer Neg Hx    Rectal cancer Neg Hx    Stomach cancer Neg Hx    Colon polyps Neg Hx     Objective:  Blood pressure (!) 160/82, pulse 66, height '5\' 8"'$  (1.727 m), weight 165 lb 6.4 oz (75 kg), SpO2 96 %. General: No acute distress.  Patient appears well-groomed.   Head:  Normocephalic/atraumatic Eyes:  fundi examined but not visualized Neck: supple, no paraspinal tenderness, full range of motion Back: No paraspinal tenderness Heart: regular rate and rhythm Lungs: Clear to auscultation bilaterally. Vascular: No carotid bruits. Neurological Exam: Mental status: alert and oriented to person, place, and time, recent and remote memory intact, fund of knowledge intact, attention and concentration intact, speech fluent and not dysarthric, language intact. Cranial nerves: CN I: not tested CN II: pupils equal, round and reactive to light, visual fields intact CN III, IV, VI:  full range of motion, no nystagmus, no ptosis CN V: facial sensation intact. CN VII: upper and lower face symmetric CN VIII: hearing intact CN IX, X: gag intact, uvula midline CN XI: sternocleidomastoid and trapezius muscles intact CN XII: tongue midline Bulk & Tone: normal, no fasciculations. Motor:  muscle strength 5/5 throughout Sensation:  Pinprick,  temperature and vibratory sensation intact. Deep Tendon Reflexes:  2+ throughout,  toes downgoing.   Finger to nose testing:  Without dysmetria.   Heel to shin:  Without dysmetria.   Gait:  Normal station and stride.  Romberg negative.    Thank you for allowing me to take part in the care of this patient.  Metta Clines, DO  CC: Shanon Ace, MD

## 2021-04-19 ENCOUNTER — Encounter: Payer: Self-pay | Admitting: Neurology

## 2021-04-19 ENCOUNTER — Ambulatory Visit (INDEPENDENT_AMBULATORY_CARE_PROVIDER_SITE_OTHER): Payer: 59 | Admitting: Neurology

## 2021-04-19 ENCOUNTER — Other Ambulatory Visit (HOSPITAL_COMMUNITY): Payer: Self-pay

## 2021-04-19 ENCOUNTER — Other Ambulatory Visit: Payer: Self-pay

## 2021-04-19 VITALS — BP 160/82 | HR 66 | Ht 68.0 in | Wt 165.4 lb

## 2021-04-19 DIAGNOSIS — R51 Headache with orthostatic component, not elsewhere classified: Secondary | ICD-10-CM | POA: Diagnosis not present

## 2021-04-19 DIAGNOSIS — Z8679 Personal history of other diseases of the circulatory system: Secondary | ICD-10-CM | POA: Diagnosis not present

## 2021-04-19 DIAGNOSIS — G4483 Primary cough headache: Secondary | ICD-10-CM

## 2021-04-19 MED ORDER — NORTRIPTYLINE HCL 10 MG PO CAPS
10.0000 mg | ORAL_CAPSULE | Freq: Every day | ORAL | 5 refills | Status: DC
  Filled 2021-04-19: qty 30, 30d supply, fill #0
  Filled 2021-05-16: qty 30, 30d supply, fill #1

## 2021-04-19 MED ORDER — DIAZEPAM 5 MG PO TABS
ORAL_TABLET | ORAL | 0 refills | Status: DC
  Filled 2021-04-19: qty 1, 1d supply, fill #0

## 2021-04-19 NOTE — Patient Instructions (Signed)
Start nortriptyline '10mg'$  at bedtime.  If no improvement in headaches by end of bottle, contact me and we can increase dose. Limit use of pain relievers (such as Advil) to no more than 2 days out of week to prevent risk of rebound or medication-overuse headache. Check MRI of brain with and without contrast and MRA head.  Further recommendations pending results. Keep headache diary Further recommendations pending results of MRI.  Otherwise, follow up in 3 months.

## 2021-04-22 ENCOUNTER — Other Ambulatory Visit: Payer: 59

## 2021-04-29 ENCOUNTER — Other Ambulatory Visit (HOSPITAL_COMMUNITY): Payer: Self-pay

## 2021-04-29 MED FILL — Dasatinib Tab 50 MG: ORAL | 30 days supply | Qty: 30 | Fill #6 | Status: AC

## 2021-04-29 MED FILL — Dasatinib Tab 50 MG: ORAL | 30 days supply | Qty: 30 | Fill #6 | Status: CN

## 2021-05-02 ENCOUNTER — Other Ambulatory Visit: Payer: Self-pay | Admitting: Internal Medicine

## 2021-05-02 ENCOUNTER — Ambulatory Visit
Admission: RE | Admit: 2021-05-02 | Discharge: 2021-05-02 | Disposition: A | Discharge status: Home / Self Care | Payer: 59 | Source: Ambulatory Visit | Attending: Neurology | Admitting: Neurology

## 2021-05-02 ENCOUNTER — Other Ambulatory Visit: Payer: Self-pay

## 2021-05-02 DIAGNOSIS — Z8679 Personal history of other diseases of the circulatory system: Secondary | ICD-10-CM

## 2021-05-02 DIAGNOSIS — G3189 Other specified degenerative diseases of nervous system: Secondary | ICD-10-CM | POA: Diagnosis not present

## 2021-05-02 DIAGNOSIS — R51 Headache with orthostatic component, not elsewhere classified: Secondary | ICD-10-CM

## 2021-05-02 DIAGNOSIS — J341 Cyst and mucocele of nose and nasal sinus: Secondary | ICD-10-CM | POA: Diagnosis not present

## 2021-05-02 DIAGNOSIS — R519 Headache, unspecified: Secondary | ICD-10-CM | POA: Diagnosis not present

## 2021-05-02 IMAGING — MR MR MRA HEAD W/O CM
2 series · 10 of 48 positions shown · non-contrast
Comparison: No pertinent prior exam.

CLINICAL DATA: Positional headache. History of subdural hematoma.
Subdural hematoma; positional headache, history of subdural
hematoma.

EXAM:
MRI HEAD WITHOUT CONTRAST
MRA HEAD WITHOUT CONTRAST
TECHNIQUE: Multiplanar, multi-echo pulse sequences of the brain and surrounding
structures were acquired without intravenous contrast. Angiographic
images of the Circle of Willis were acquired using MRA technique
without intravenous contrast.

[Series 5: [id number] · axial · 0.6mm · 0.26mm/px · z∈[-76,+1]mm · 9 of 162 slices shown]
[im 8/162]
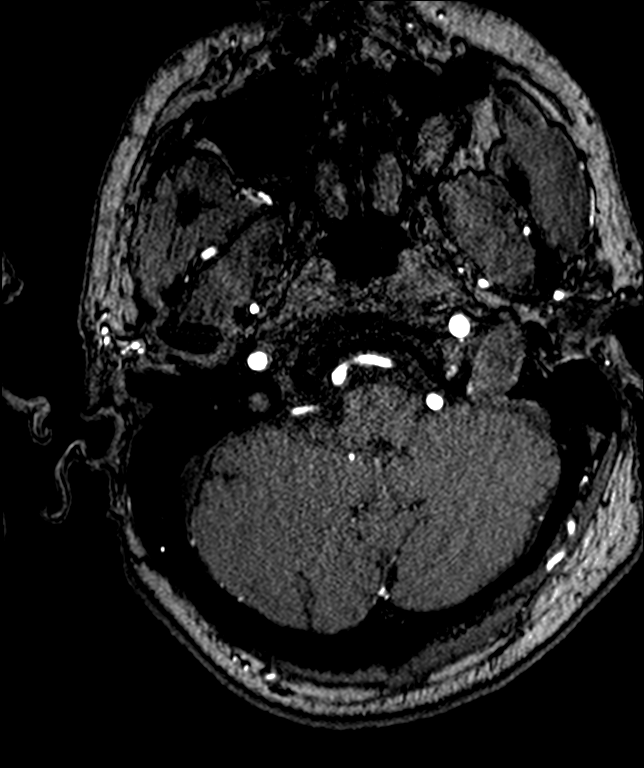
[im 25/162]
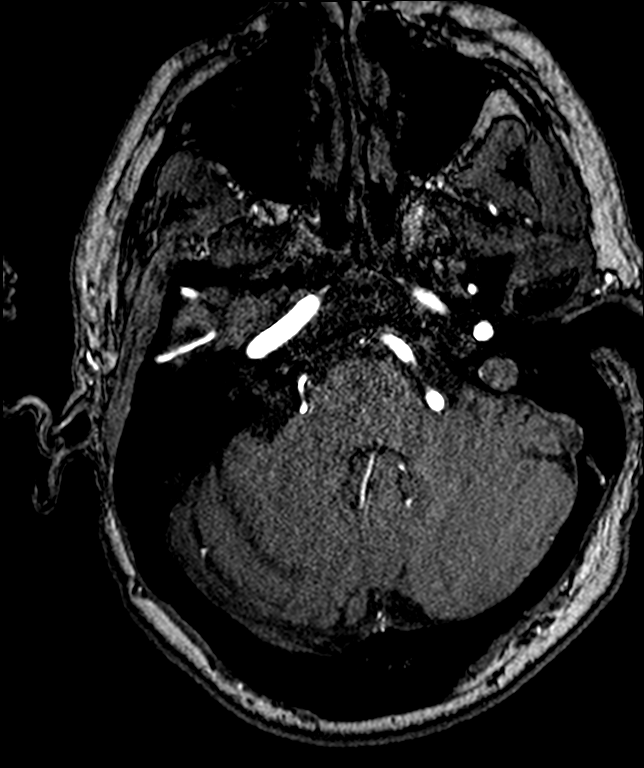
[im 29/162]
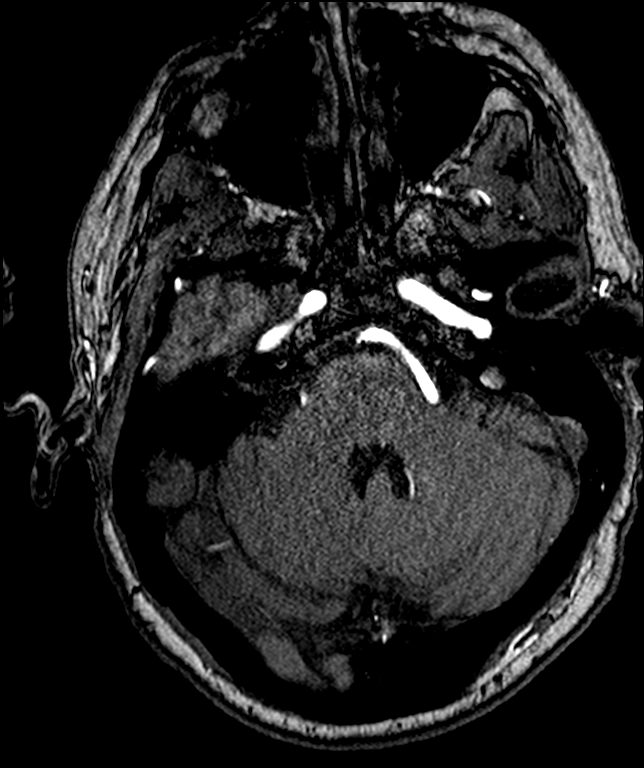
[im 50/162]
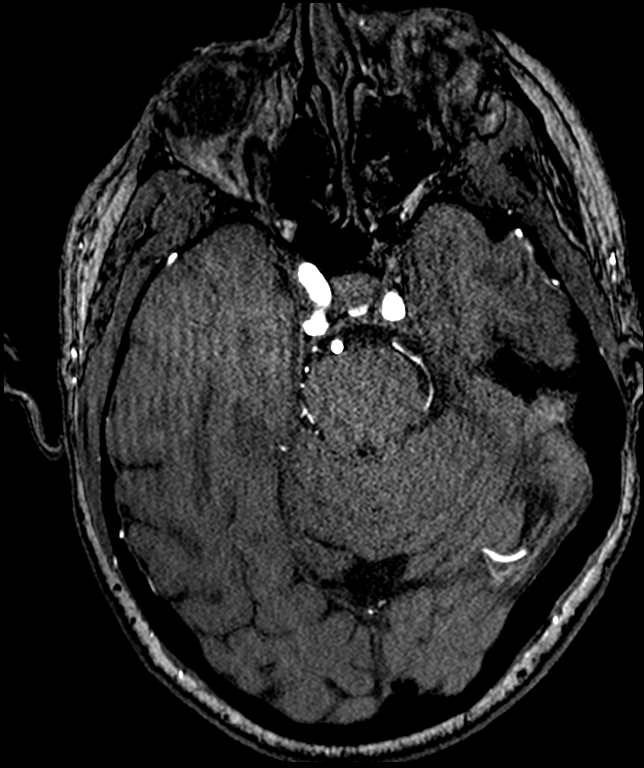
[im 71/162]
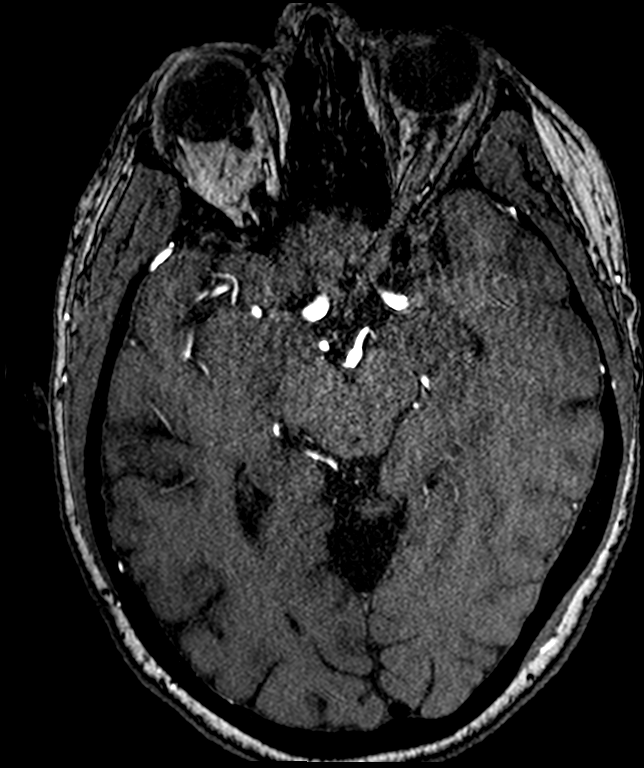
[im 81/162]
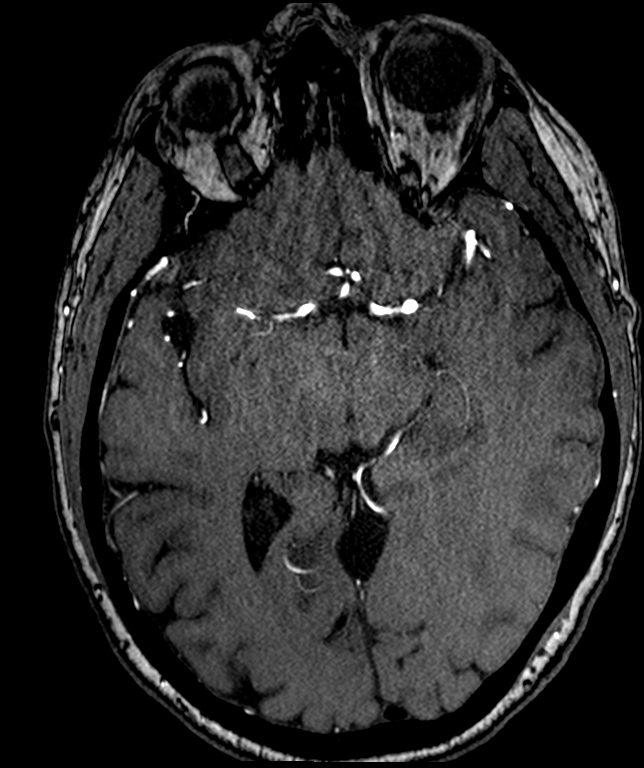
[im 92/162]
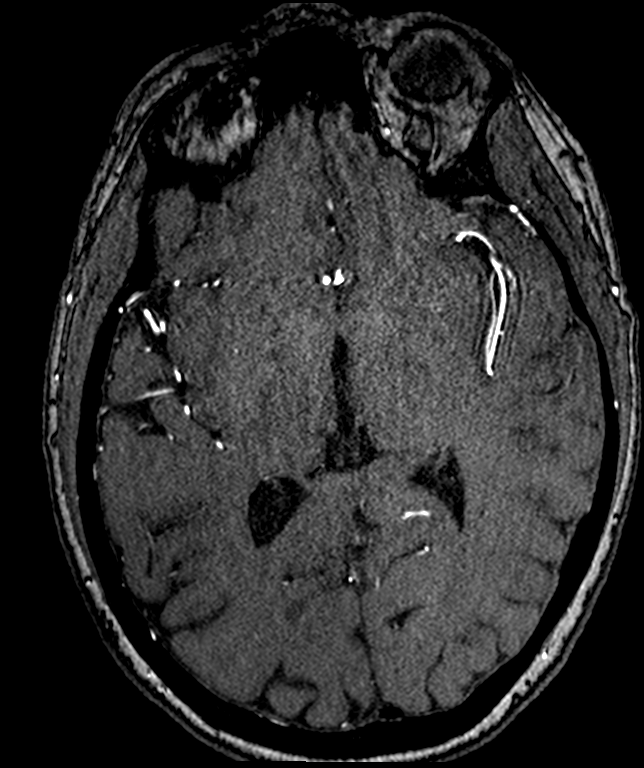
[im 113/162]
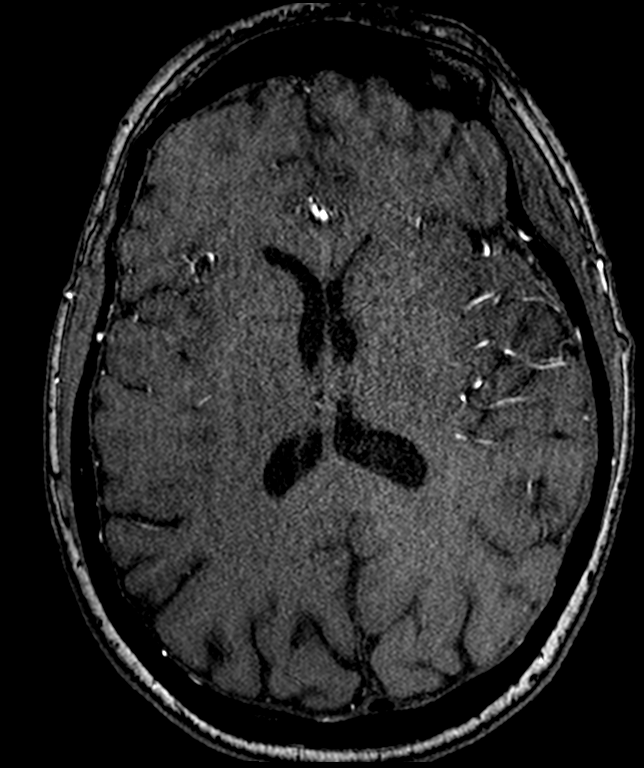
[im 137/162]
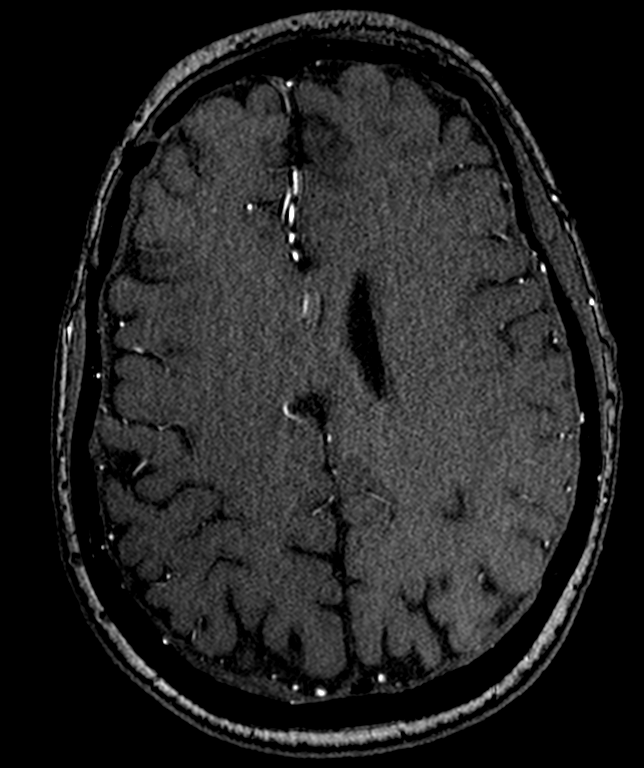

[Series 104: posterior tumble · axial · 0.26mm/px · 1 of 2 slices shown]
[im 1/2]
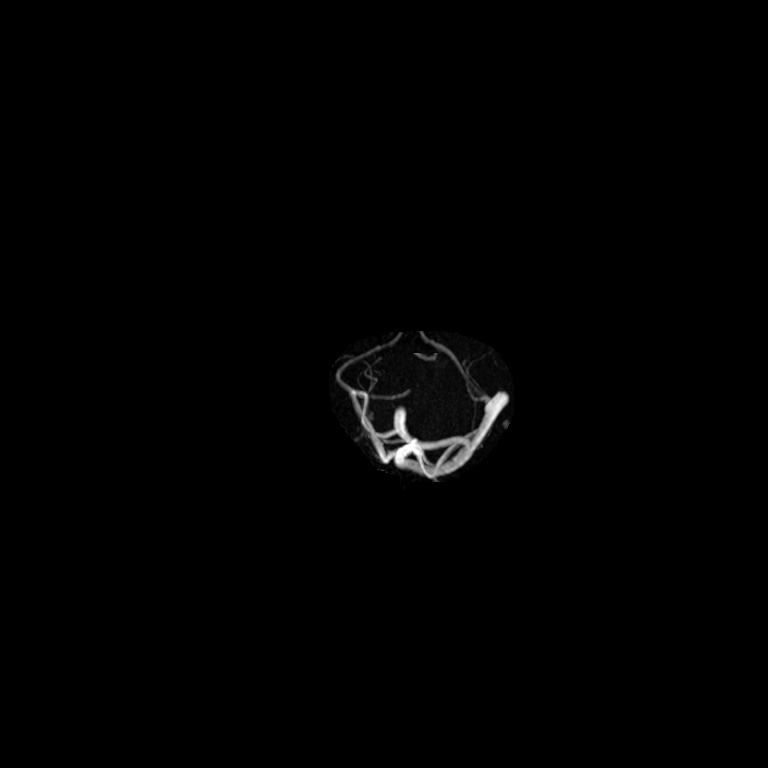

[10 of 48 positions shown; findings below may reference images not displayed]

FINDINGS: MRI HEAD FINDINGS

Brain:

Mild-to-moderate motion degradation.

Mild generalized cerebral atrophy.

There are small chronic subdural hematomas overlying the bilateral
cerebral hemispheres, in retrospect present on the prior head CT of
[DATE] and not appreciably changed since that time. On the
right, the chronic subdural hematoma measures up to 4 mm in
thickness. The left chronic left subdural hematoma is trace.
Associated dural thickening and enhancement overlying both cerebral
hemispheres, likely reactive.

There is SWI signal loss along the mid to posterior right cerebral
hemisphere, compatible with sequela of remote subarachnoid
hemorrhage.

Suspected small arachnoid cyst above the cerebellum at midline,
unchanged.

No cortical encephalomalacia is identified. No significant cerebral
white matter disease for age.

There is no acute infarct.

No evidence of an intracranial mass.

No extra-axial fluid collection.

No midline shift.

Vascular: Maintained flow voids within the proximal large arterial
vessels.

Skull and upper cervical spine: Right frontal cranioplasty. No focal
suspicious marrow lesion.

Sinuses/Orbits: Visualized orbits show no acute finding. Small right
maxillary sinus mucous retention cyst.

MRA HEAD FINDINGS

Anterior circulation:

The intracranial internal carotid arteries are patent. The M1 middle
cerebral arteries are patent. No M2 proximal branch occlusion or
high-grade proximal stenosis is identified. The anterior cerebral
arteries are patent. 2 mm inferiorly projecting conical vascular
protrusion arising from the supraclinoid right ICA, with a vessel
emerging from its apex, compatible with an infundibulum. No
intracranial aneurysm is identified.

Posterior circulation:

The intracranial vertebral arteries are patent. The basilar artery
is patent. The posterior cerebral arteries are patent. A left
posterior communicating artery is present. The right posterior
communicating artery is hypoplastic or absent.

Anatomic variants: As described.
IMPRESSION: MRI brain:

1. Motion degraded examination.
2. Small chronic subdural hematomas overlying the bilateral cerebral
hemispheres (measuring up to 4 mm in thickness on the right, and
trace on the left), unchanged as compared to the head CT of
[DATE]. No significant mass effect. Associated bilateral dural
thickening and enhancement, likely reactive.
3. Hemosiderin deposition along the mid-to-posterior right cerebral
hemisphere, compatible with sequela of remote subarachnoid
hemorrhage.
4. Mild generalized cerebral atrophy.
5. Small right maxillary sinus mucous retention cyst.

MRA head:

1. No intracranial large vessel occlusion or proximal high-grade
arterial stenosis.
2. No intracranial aneurysm is identified.

## 2021-05-02 IMAGING — MR MR HEAD WO/W CM
13 of 16 series · 35 of 48 positions shown · non-contrast
Comparison: No pertinent prior exam.

CLINICAL DATA: Positional headache. History of subdural hematoma.
Subdural hematoma; positional headache, history of subdural
hematoma.

EXAM:
MRI HEAD WITHOUT CONTRAST
MRA HEAD WITHOUT CONTRAST
TECHNIQUE: Multiplanar, multi-echo pulse sequences of the brain and surrounding
structures were acquired without intravenous contrast. Angiographic
images of the Circle of Willis were acquired using MRA technique
without intravenous contrast.

[Series 5: T1 · sagittal · 4.0mm · 0.75mm/px · 1 of 32 slices shown (1 of 3)]
[im 1/32]
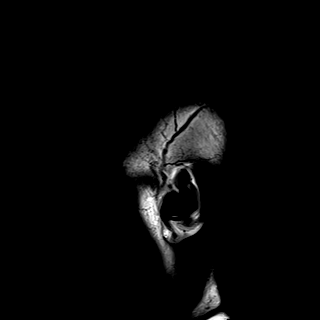

[Series 6: DWI · axial · 3.0mm · 0.94mm/px · z∈[-91,+72]mm · 7 of 187 slices shown (1 of 3)]
[im 1/187]
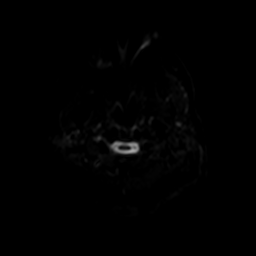
[im 32/187]
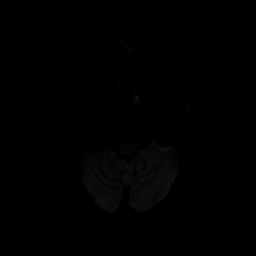
[im 63/187]
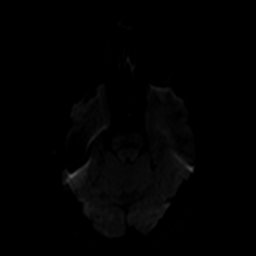
[im 94/187]
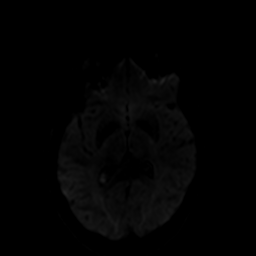
[im 125/187]
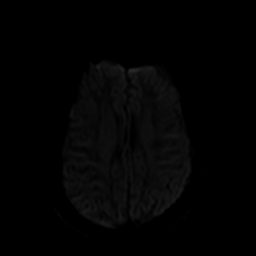
[im 156/187]
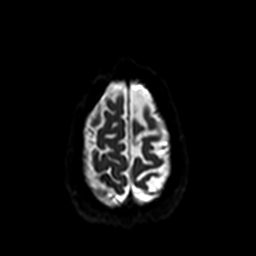
[im 187/187]
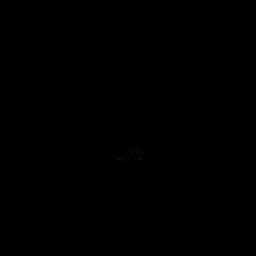

[Series 9: DWI · coronal · 5.0mm · 1.44mm/px · 4 of 86 slices shown (2 of 3)]
[im 1/86]
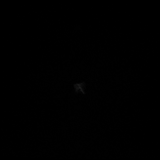
[im 29/86]
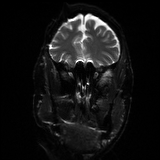
[im 57/86]
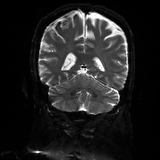
[im 86/86]
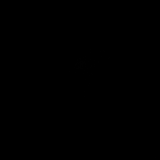

[Series 10: DWI · coronal · 5.0mm · 1.44mm/px · 2 of 42 slices shown (3 of 3)]
[im 1/42]
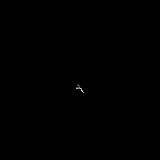
[im 42/42]
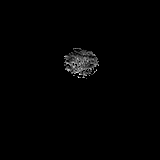

[Series 11: T2 · axial · 4.0mm · 0.38mm/px · z∈[-105,+84]mm · 2 of 38 slices shown (1 of 3)]
[im 1/38]
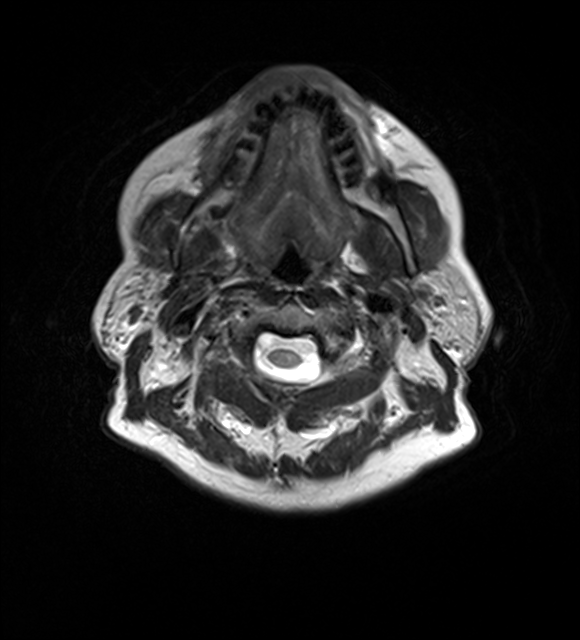
[im 38/38]
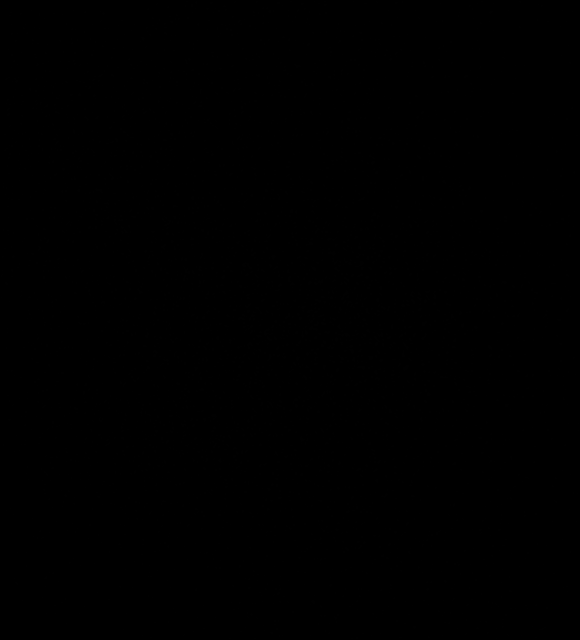

[Series 12: FLAIR · axial · 3.0mm · 0.72mm/px · 1 of 26 slices shown]
[im 1/26]
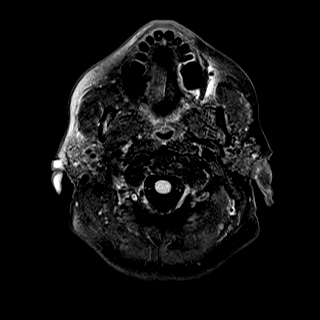

[Series 13: T2 · axial · 4.0mm · 0.38mm/px · z∈[-105,+84]mm · 2 of 38 slices shown (2 of 3)]
[im 1/38]
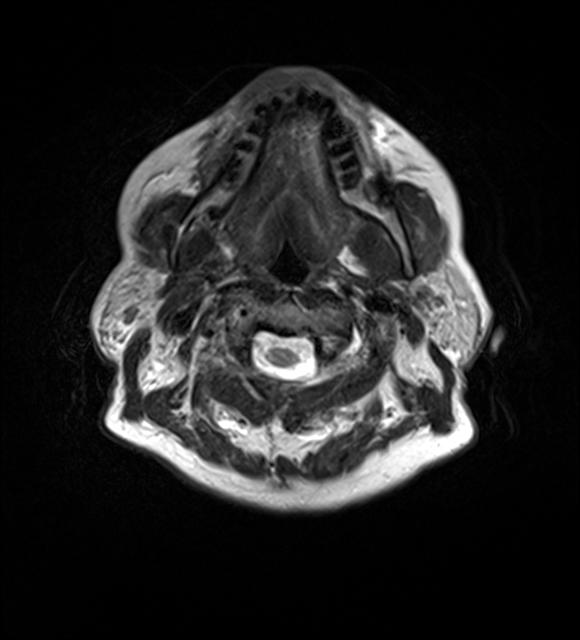
[im 38/38]
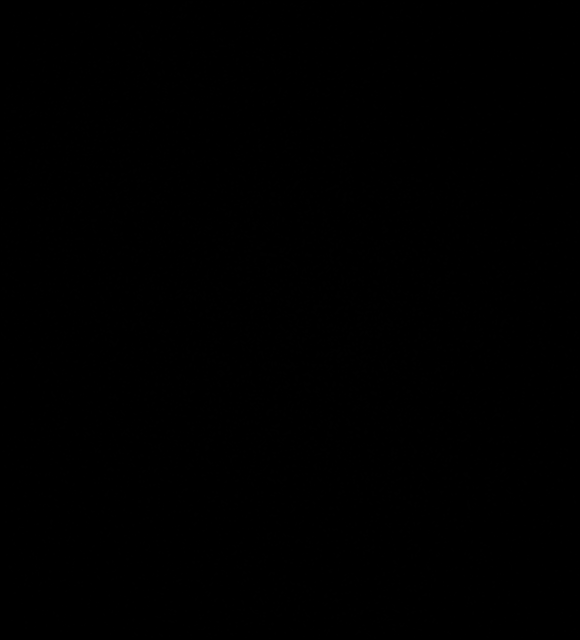

[Series 16: T2 · axial · 4.0mm · 0.38mm/px · z∈[-105,+84]mm · 2 of 38 slices shown (3 of 3)]
[im 1/38]
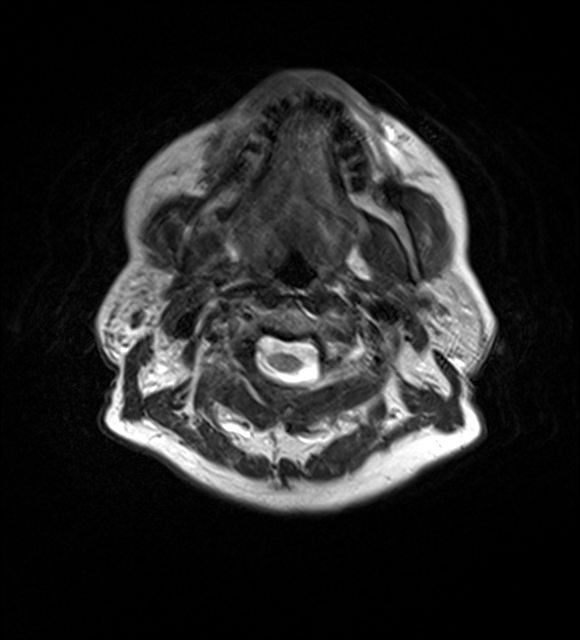
[im 38/38]
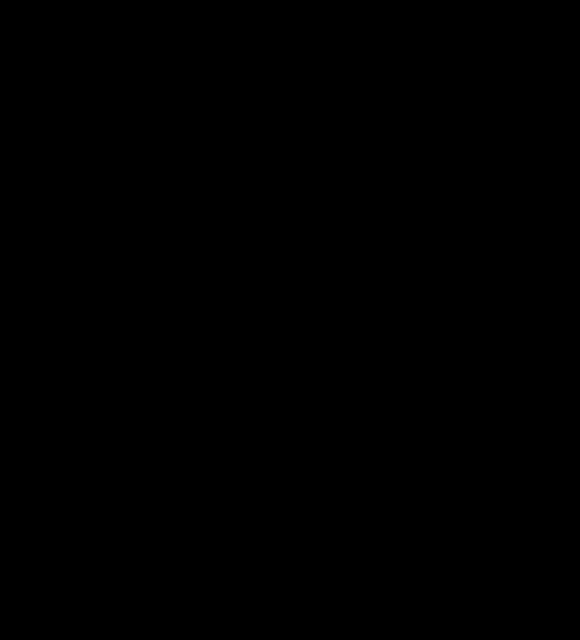

[Series 17: T1 · axial · 1.0mm · 0.94mm/px · z∈[-88,+68]mm · 7 of 160 slices shown (2 of 3)]
[im 1/160]
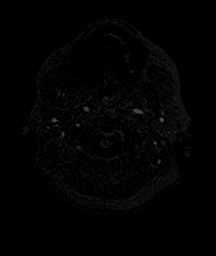
[im 27/160]
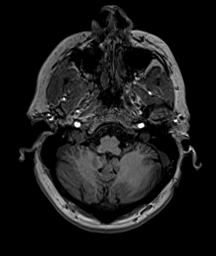
[im 54/160]
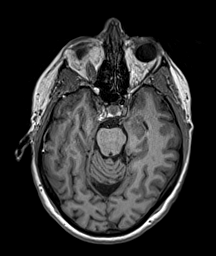
[im 80/160]
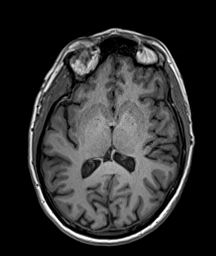
[im 107/160]
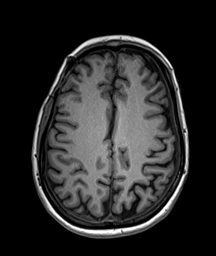
[im 133/160]
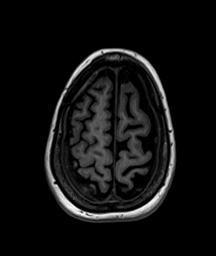
[im 160/160]
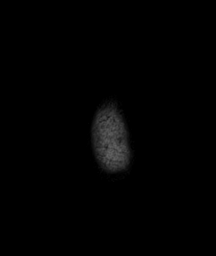

[Series 18: T2 post-contrast · coronal · 4.5mm · 0.36mm/px · 2 of 38 slices shown]
[im 1/38]
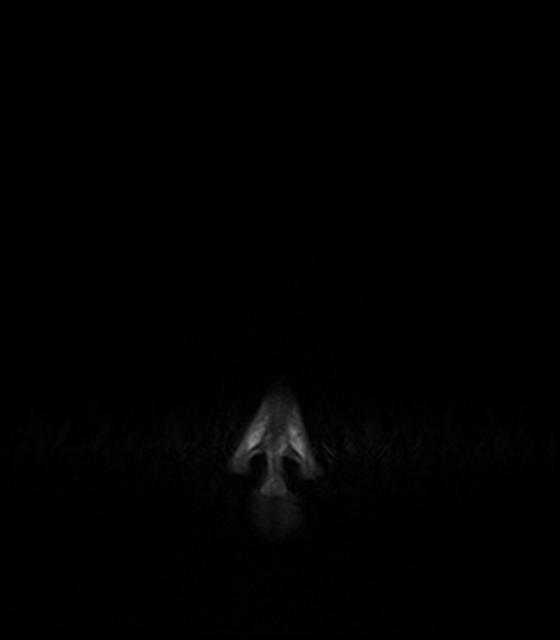
[im 38/38]
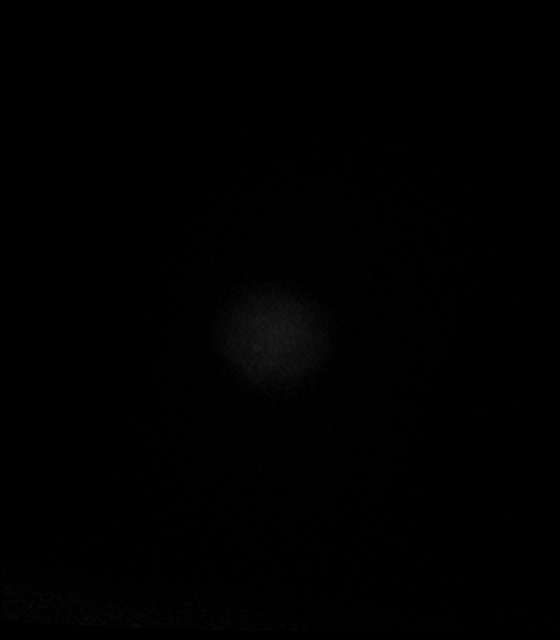

[Series 19: T1 · axial · 1.0mm · 0.94mm/px · z∈[-88,-63]mm · 2 of 160 slices shown (3 of 3)]
[im 1/160]
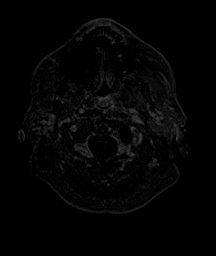
[im 27/160]
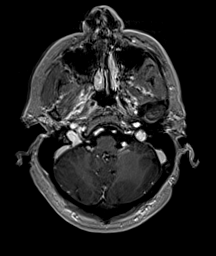

[Series 20: T1 post-contrast · coronal · 4.5mm · 0.72mm/px · 2 of 38 slices shown (1 of 2)]
[im 1/38]
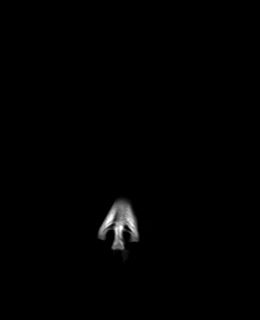
[im 38/38]
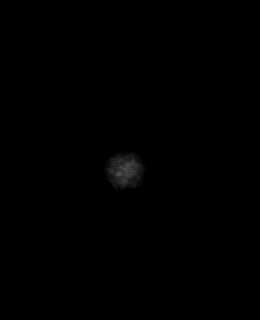

[Series 21: T1 post-contrast · sagittal · 4.0mm · 0.75mm/px · 1 of 32 slices shown (2 of 2)]
[im 1/32]
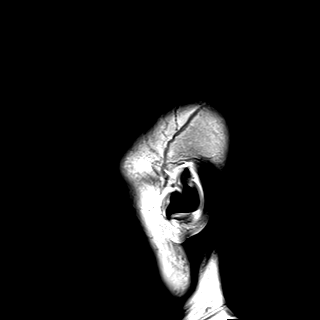

[35 of 48 positions shown; findings below may reference images not displayed]

FINDINGS: MRI HEAD FINDINGS

Brain:

Mild-to-moderate motion degradation.

Mild generalized cerebral atrophy.

There are small chronic subdural hematomas overlying the bilateral
cerebral hemispheres, in retrospect present on the prior head CT of
[DATE] and not appreciably changed since that time. On the
right, the chronic subdural hematoma measures up to 4 mm in
thickness. The left chronic left subdural hematoma is trace.
Associated dural thickening and enhancement overlying both cerebral
hemispheres, likely reactive.

There is SWI signal loss along the mid to posterior right cerebral
hemisphere, compatible with sequela of remote subarachnoid
hemorrhage.

Suspected small arachnoid cyst above the cerebellum at midline,
unchanged.

No cortical encephalomalacia is identified. No significant cerebral
white matter disease for age.

There is no acute infarct.

No evidence of an intracranial mass.

No extra-axial fluid collection.

No midline shift.

Vascular: Maintained flow voids within the proximal large arterial
vessels.

Skull and upper cervical spine: Right frontal cranioplasty. No focal
suspicious marrow lesion.

Sinuses/Orbits: Visualized orbits show no acute finding. Small right
maxillary sinus mucous retention cyst.

MRA HEAD FINDINGS

Anterior circulation:

The intracranial internal carotid arteries are patent. The M1 middle
cerebral arteries are patent. No M2 proximal branch occlusion or
high-grade proximal stenosis is identified. The anterior cerebral
arteries are patent. 2 mm inferiorly projecting conical vascular
protrusion arising from the supraclinoid right ICA, with a vessel
emerging from its apex, compatible with an infundibulum. No
intracranial aneurysm is identified.

Posterior circulation:

The intracranial vertebral arteries are patent. The basilar artery
is patent. The posterior cerebral arteries are patent. A left
posterior communicating artery is present. The right posterior
communicating artery is hypoplastic or absent.

Anatomic variants: As described.
IMPRESSION: MRI brain:

1. Motion degraded examination.
2. Small chronic subdural hematomas overlying the bilateral cerebral
hemispheres (measuring up to 4 mm in thickness on the right, and
trace on the left), unchanged as compared to the head CT of
[DATE]. No significant mass effect. Associated bilateral dural
thickening and enhancement, likely reactive.
3. Hemosiderin deposition along the mid-to-posterior right cerebral
hemisphere, compatible with sequela of remote subarachnoid
hemorrhage.
4. Mild generalized cerebral atrophy.
5. Small right maxillary sinus mucous retention cyst.

MRA head:

1. No intracranial large vessel occlusion or proximal high-grade
arterial stenosis.
2. No intracranial aneurysm is identified.

## 2021-05-02 MED ORDER — GADOBENATE DIMEGLUMINE 529 MG/ML IV SOLN
15.0000 mL | Freq: Once | INTRAVENOUS | Status: AC | PRN
  Administered 2021-05-02: 15 mL via INTRAVENOUS

## 2021-05-02 MED ORDER — LOSARTAN POTASSIUM 50 MG PO TABS
50.0000 mg | ORAL_TABLET | Freq: Every day | ORAL | 2 refills | Status: DC
  Filled 2021-05-02: qty 90, 90d supply, fill #0
  Filled 2021-08-23: qty 90, 90d supply, fill #1
  Filled 2021-11-20: qty 90, 90d supply, fill #2

## 2021-05-03 ENCOUNTER — Other Ambulatory Visit (HOSPITAL_COMMUNITY): Payer: Self-pay

## 2021-05-04 ENCOUNTER — Other Ambulatory Visit (HOSPITAL_COMMUNITY): Payer: Self-pay

## 2021-05-05 ENCOUNTER — Telehealth: Payer: Self-pay | Admitting: Neurology

## 2021-05-05 NOTE — Telephone Encounter (Signed)
Patient called and said she was returning Dr. Georgie Chard call for MRI results.

## 2021-05-06 NOTE — Telephone Encounter (Signed)
Called and left VM for patient to call back

## 2021-05-10 NOTE — Telephone Encounter (Signed)
Patient returned call to Dr. Tomi Likens for results.

## 2021-05-10 NOTE — Telephone Encounter (Signed)
Left message on VM to call back the office

## 2021-05-11 NOTE — Telephone Encounter (Signed)
Patient returned call to Dr. Tomi Likens.

## 2021-05-13 ENCOUNTER — Telehealth: Payer: Self-pay | Admitting: Neurology

## 2021-05-13 NOTE — Telephone Encounter (Signed)
Pt is returning a call to sheena 

## 2021-05-13 NOTE — Telephone Encounter (Signed)
Discussed MRI results with Christina [name].  Evidence of old subdural.  Probably residual from prior injury.  Nothing acute.  She reports headaches are improving, now manageable.  Would like to remain on nortriptyline at current dose for now (10mg  at bedtime)

## 2021-05-16 MED FILL — Levothyroxine Sodium Tab 175 MCG: ORAL | 90 days supply | Qty: 90 | Fill #2 | Status: AC

## 2021-05-17 ENCOUNTER — Other Ambulatory Visit (HOSPITAL_COMMUNITY): Payer: Self-pay

## 2021-05-19 ENCOUNTER — Other Ambulatory Visit: Payer: Self-pay

## 2021-05-19 DIAGNOSIS — Z23 Encounter for immunization: Secondary | ICD-10-CM

## 2021-05-28 ENCOUNTER — Other Ambulatory Visit (HOSPITAL_COMMUNITY): Payer: Self-pay

## 2021-05-28 ENCOUNTER — Other Ambulatory Visit: Payer: Self-pay | Admitting: Internal Medicine

## 2021-05-31 ENCOUNTER — Telehealth: Payer: Self-pay | Admitting: Neurology

## 2021-05-31 ENCOUNTER — Other Ambulatory Visit (HOSPITAL_COMMUNITY): Payer: Self-pay

## 2021-05-31 ENCOUNTER — Other Ambulatory Visit: Payer: Self-pay | Admitting: Hematology and Oncology

## 2021-05-31 MED ORDER — ZOLPIDEM TARTRATE 10 MG PO TABS
ORAL_TABLET | Freq: Every evening | ORAL | 0 refills | Status: DC | PRN
  Filled 2021-05-31: qty 90, 90d supply, fill #0

## 2021-05-31 MED ORDER — NORTRIPTYLINE HCL 25 MG PO CAPS
25.0000 mg | ORAL_CAPSULE | Freq: Every day | ORAL | 3 refills | Status: DC
  Filled 2021-05-31: qty 30, 30d supply, fill #0

## 2021-05-31 NOTE — Telephone Encounter (Signed)
Over past 2 weeks, patient has had increase in headache frequency. Will increase nortriptyline to 25mg  at bedtime

## 2021-05-31 NOTE — Telephone Encounter (Signed)
Patient's spouse Dr. Sarajane Jews called about his wife. He is asking to speak with Dr. Tomi Likens directly, if possible.

## 2021-05-31 NOTE — Telephone Encounter (Signed)
Sent in electronically .  

## 2021-06-01 ENCOUNTER — Other Ambulatory Visit (HOSPITAL_COMMUNITY): Payer: Self-pay

## 2021-06-01 MED ORDER — DASATINIB 50 MG PO TABS
ORAL_TABLET | Freq: Every day | ORAL | 11 refills | Status: DC
  Filled 2021-06-01: qty 30, 30d supply, fill #0

## 2021-06-02 ENCOUNTER — Other Ambulatory Visit (HOSPITAL_COMMUNITY): Payer: Self-pay

## 2021-06-02 ENCOUNTER — Other Ambulatory Visit: Payer: Self-pay | Admitting: Adult Health

## 2021-06-02 MED ORDER — METHYLPREDNISOLONE 4 MG PO TBPK
ORAL_TABLET | ORAL | 0 refills | Status: DC
  Filled 2021-06-02: qty 21, 6d supply, fill #0

## 2021-06-02 MED ORDER — AZITHROMYCIN 250 MG PO TABS
ORAL_TABLET | ORAL | 0 refills | Status: AC
  Filled 2021-06-02: qty 6, 5d supply, fill #0

## 2021-06-04 ENCOUNTER — Ambulatory Visit (INDEPENDENT_AMBULATORY_CARE_PROVIDER_SITE_OTHER): Payer: 59 | Admitting: Neurology

## 2021-06-04 ENCOUNTER — Inpatient Hospital Stay (HOSPITAL_COMMUNITY)
Admission: EM | Admit: 2021-06-04 | Discharge: 2021-06-05 | DRG: 027 | Disposition: A | Discharge status: Home / Self Care | Payer: 59 | Attending: Neurological Surgery | Admitting: Neurological Surgery

## 2021-06-04 ENCOUNTER — Encounter (HOSPITAL_COMMUNITY): Payer: Self-pay | Admitting: Certified Registered"

## 2021-06-04 ENCOUNTER — Emergency Department (HOSPITAL_COMMUNITY): Payer: 59 | Admitting: Anesthesiology

## 2021-06-04 ENCOUNTER — Other Ambulatory Visit: Payer: Self-pay

## 2021-06-04 ENCOUNTER — Ambulatory Visit
Admission: RE | Admit: 2021-06-04 | Discharge: 2021-06-04 | Disposition: A | Discharge status: Home / Self Care | Payer: 59 | Source: Ambulatory Visit | Attending: Neurology | Admitting: Neurology

## 2021-06-04 ENCOUNTER — Encounter (HOSPITAL_COMMUNITY)
Admission: EM | Disposition: A | Discharge status: Home / Self Care | Payer: Self-pay | Source: Home / Self Care | Attending: Neurological Surgery

## 2021-06-04 ENCOUNTER — Encounter: Payer: Self-pay | Admitting: Neurology

## 2021-06-04 VITALS — BP 145/82 | HR 67 | Ht 67.0 in | Wt 165.0 lb

## 2021-06-04 VITALS — BP 145/82 | HR 67 | Ht 66.0 in | Wt 165.0 lb

## 2021-06-04 DIAGNOSIS — S065XAA Traumatic subdural hemorrhage with loss of consciousness status unknown, initial encounter: Secondary | ICD-10-CM | POA: Diagnosis not present

## 2021-06-04 DIAGNOSIS — R51 Headache with orthostatic component, not elsewhere classified: Secondary | ICD-10-CM

## 2021-06-04 DIAGNOSIS — I10 Essential (primary) hypertension: Secondary | ICD-10-CM | POA: Diagnosis not present

## 2021-06-04 DIAGNOSIS — Z8041 Family history of malignant neoplasm of ovary: Secondary | ICD-10-CM | POA: Diagnosis not present

## 2021-06-04 DIAGNOSIS — U071 COVID-19: Secondary | ICD-10-CM

## 2021-06-04 DIAGNOSIS — G9389 Other specified disorders of brain: Secondary | ICD-10-CM | POA: Diagnosis not present

## 2021-06-04 DIAGNOSIS — Z8249 Family history of ischemic heart disease and other diseases of the circulatory system: Secondary | ICD-10-CM | POA: Diagnosis not present

## 2021-06-04 DIAGNOSIS — Z806 Family history of leukemia: Secondary | ICD-10-CM

## 2021-06-04 DIAGNOSIS — Z9889 Other specified postprocedural states: Secondary | ICD-10-CM

## 2021-06-04 DIAGNOSIS — Z8616 Personal history of COVID-19: Secondary | ICD-10-CM | POA: Diagnosis not present

## 2021-06-04 DIAGNOSIS — R519 Headache, unspecified: Secondary | ICD-10-CM

## 2021-06-04 DIAGNOSIS — E782 Mixed hyperlipidemia: Secondary | ICD-10-CM | POA: Diagnosis not present

## 2021-06-04 DIAGNOSIS — I6200 Nontraumatic subdural hemorrhage, unspecified: Secondary | ICD-10-CM | POA: Diagnosis not present

## 2021-06-04 DIAGNOSIS — Z833 Family history of diabetes mellitus: Secondary | ICD-10-CM | POA: Diagnosis not present

## 2021-06-04 DIAGNOSIS — Z803 Family history of malignant neoplasm of breast: Secondary | ICD-10-CM | POA: Diagnosis not present

## 2021-06-04 DIAGNOSIS — Z9989 Dependence on other enabling machines and devices: Secondary | ICD-10-CM | POA: Diagnosis not present

## 2021-06-04 DIAGNOSIS — E559 Vitamin D deficiency, unspecified: Secondary | ICD-10-CM | POA: Diagnosis present

## 2021-06-04 DIAGNOSIS — E039 Hypothyroidism, unspecified: Secondary | ICD-10-CM | POA: Diagnosis not present

## 2021-06-04 DIAGNOSIS — Z856 Personal history of leukemia: Secondary | ICD-10-CM

## 2021-06-04 DIAGNOSIS — W19XXXA Unspecified fall, initial encounter: Secondary | ICD-10-CM | POA: Diagnosis present

## 2021-06-04 DIAGNOSIS — G4733 Obstructive sleep apnea (adult) (pediatric): Secondary | ICD-10-CM | POA: Diagnosis present

## 2021-06-04 DIAGNOSIS — I6203 Nontraumatic chronic subdural hemorrhage: Secondary | ICD-10-CM | POA: Diagnosis not present

## 2021-06-04 HISTORY — PX: CRANIOTOMY: SHX93

## 2021-06-04 HISTORY — DX: COVID-19: U07.1

## 2021-06-04 LAB — CBC WITH DIFFERENTIAL/PLATELET
Abs Immature Granulocytes: 0.02 10*3/uL (ref 0.00–0.07)
Basophils Absolute: 0.1 10*3/uL (ref 0.0–0.1)
Basophils Relative: 1 %
Eosinophils Absolute: 0 10*3/uL (ref 0.0–0.5)
Eosinophils Relative: 0 %
HCT: 39.2 % (ref 36.0–46.0)
Hemoglobin: 13.5 g/dL (ref 12.0–15.0)
Immature Granulocytes: 0 %
Lymphocytes Relative: 10 %
Lymphs Abs: 0.9 10*3/uL (ref 0.7–4.0)
MCH: 31.8 pg (ref 26.0–34.0)
MCHC: 34.4 g/dL (ref 30.0–36.0)
MCV: 92.5 fL (ref 80.0–100.0)
Monocytes Absolute: 0.5 10*3/uL (ref 0.1–1.0)
Monocytes Relative: 6 %
Neutro Abs: 8.1 10*3/uL — ABNORMAL HIGH (ref 1.7–7.7)
Neutrophils Relative %: 83 %
Platelets: 292 10*3/uL (ref 150–400)
RBC: 4.24 MIL/uL (ref 3.87–5.11)
RDW: 13.5 % (ref 11.5–15.5)
WBC: 9.6 10*3/uL (ref 4.0–10.5)
nRBC: 0 % (ref 0.0–0.2)

## 2021-06-04 LAB — COMPREHENSIVE METABOLIC PANEL
ALT: 19 U/L (ref 0–44)
AST: 18 U/L (ref 15–41)
Albumin: 4.1 g/dL (ref 3.5–5.0)
Alkaline Phosphatase: 44 U/L (ref 38–126)
Anion gap: 8 (ref 5–15)
BUN: 21 mg/dL — ABNORMAL HIGH (ref 6–20)
CO2: 22 mmol/L (ref 22–32)
Calcium: 9.1 mg/dL (ref 8.9–10.3)
Chloride: 98 mmol/L (ref 98–111)
Creatinine, Ser: 0.64 mg/dL (ref 0.44–1.00)
GFR, Estimated: >60 mL/min (ref >60)
Glucose, Bld: 108 mg/dL — ABNORMAL HIGH (ref 70–99)
Potassium: 3.7 mmol/L (ref 3.5–5.1)
Sodium: 128 mmol/L — ABNORMAL LOW (ref 135–145)
Total Bilirubin: 0.5 mg/dL (ref 0.3–1.2)
Total Protein: 6.9 g/dL (ref 6.5–8.1)

## 2021-06-04 LAB — MRSA NEXT GEN BY PCR, NASAL: MRSA by PCR Next Gen: NOT DETECTED

## 2021-06-04 LAB — PROTIME-INR
INR: 0.9 (ref 0.8–1.2)
Prothrombin Time: 11.9 seconds (ref 11.4–15.2)

## 2021-06-04 LAB — TYPE AND SCREEN
ABO/RH(D): O POS
Antibody Screen: NEGATIVE

## 2021-06-04 LAB — APTT: aPTT: 31 seconds (ref 24–36)

## 2021-06-04 LAB — RESP PANEL BY RT-PCR (FLU A&B, COVID) ARPGX2
Influenza A by PCR: NEGATIVE
Influenza B by PCR: NEGATIVE
SARS Coronavirus 2 by RT PCR: POSITIVE — AB

## 2021-06-04 LAB — ABO/RH: ABO/RH(D): O POS

## 2021-06-04 IMAGING — CT CT HEAD W/O CM
4 series · 16 of 47 positions shown, 18 images · non-contrast
Comparison: Brain MRI [DATE]

CLINICAL DATA: Headache for 3 days, injury to the head in [DATE],
history of craniotomy

EXAM:
CT HEAD WITHOUT CONTRAST
TECHNIQUE: Contiguous axial images were obtained from the base of the skull
through the vertex without intravenous contrast.

[Series 2: head 5.00 hr40 s3 axial ibhc · axial · 0.45mm/px · z∈[-608,-483]mm · 6 of 35 slices shown, 8 images]
[im 5/35  brain]
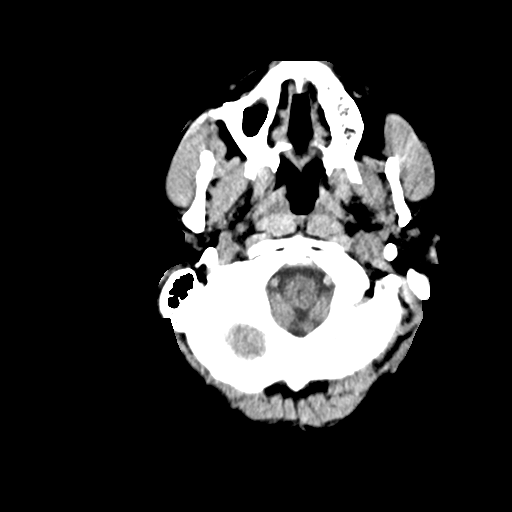
[im 5/35  bone]
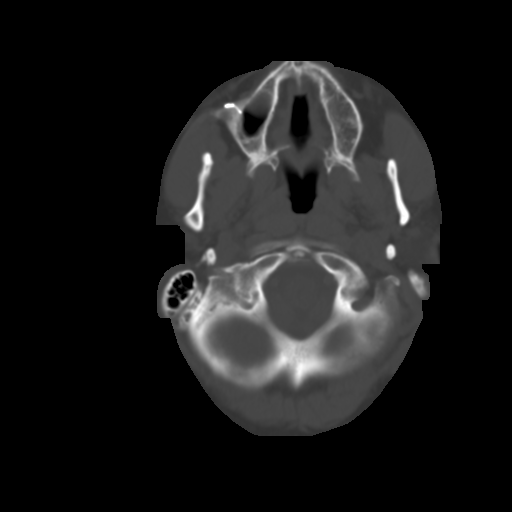
[im 10/35  brain]
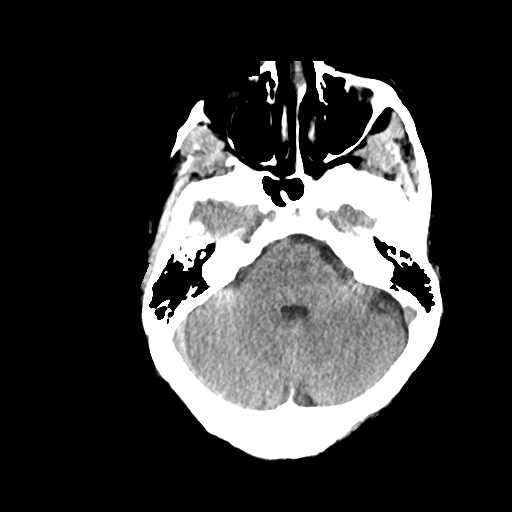
[im 15/35  brain]
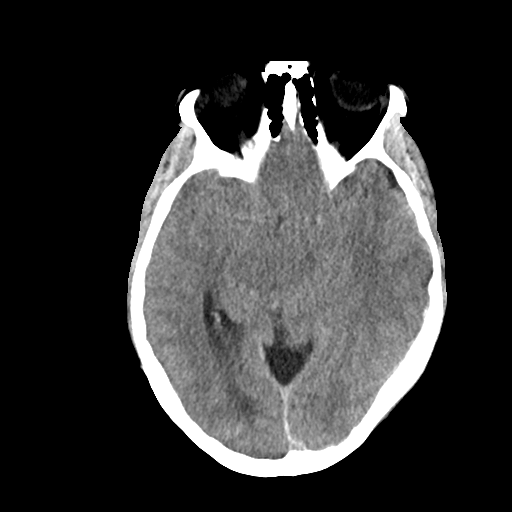
[im 20/35  brain]
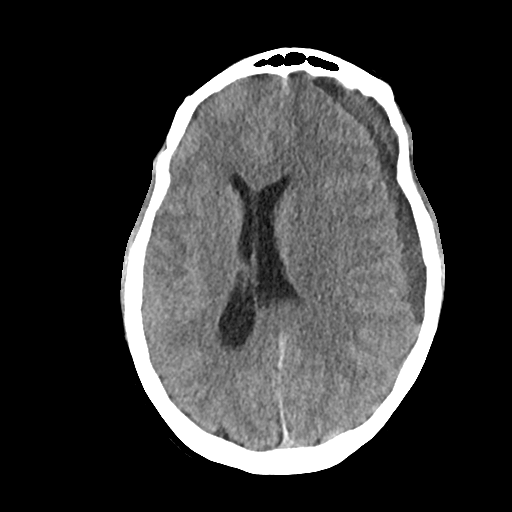
[im 25/35  brain]
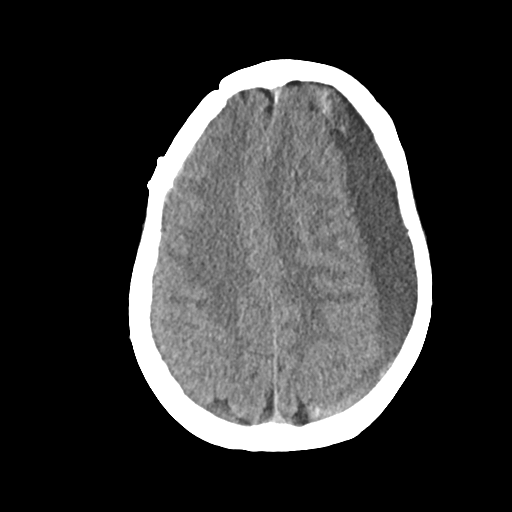
[im 25/35  bone]
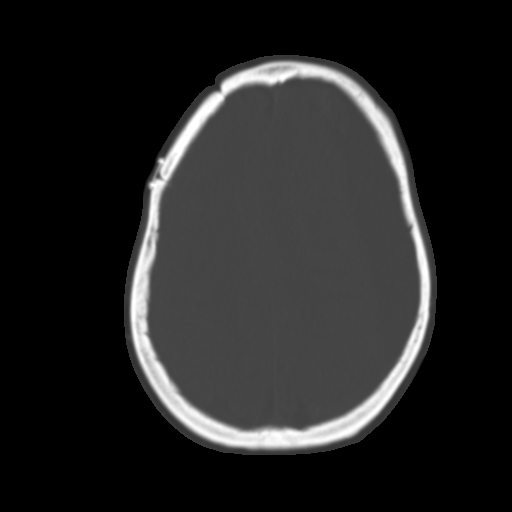
[im 30/35  brain]
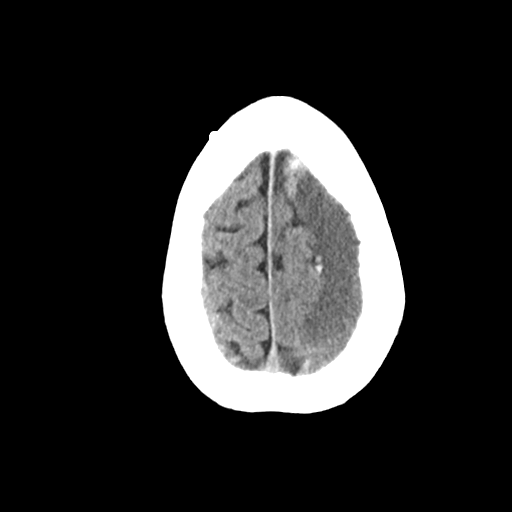

[Series 3: head 2.00 hr60 s3 axial bone · axial · 0.45mm/px · z∈[-614,-556]mm · 4 of 89 slices shown]
[im 9/89  bone]
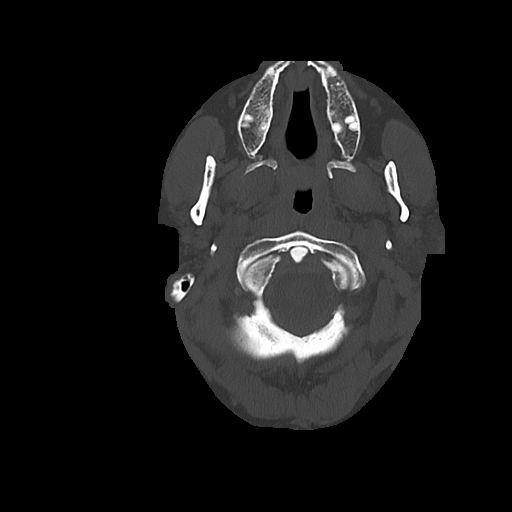
[im 17/89  bone]
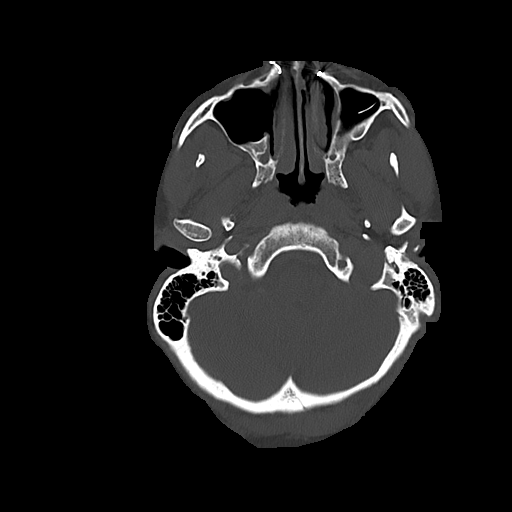
[im 30/89  bone]
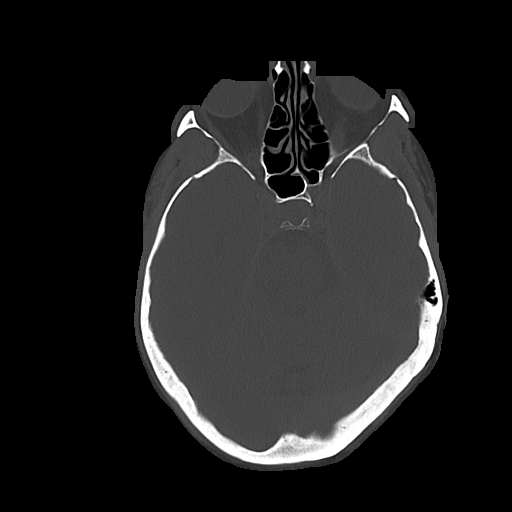
[im 38/89  bone]
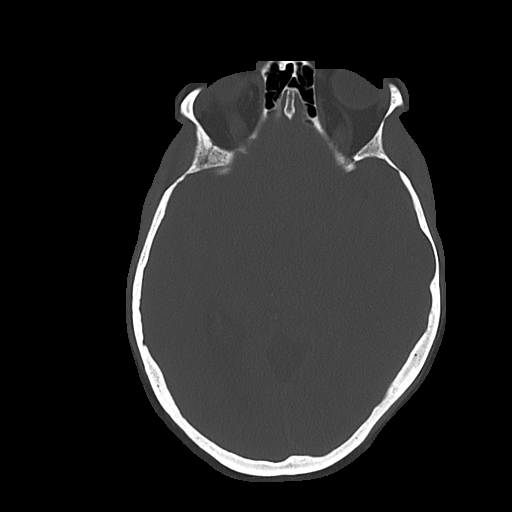

[Series 4: head 3.00 hr40 s3 sag · sagittal · 0.37mm/px · 3 of 59 slices shown]
[im 20/59  brain]
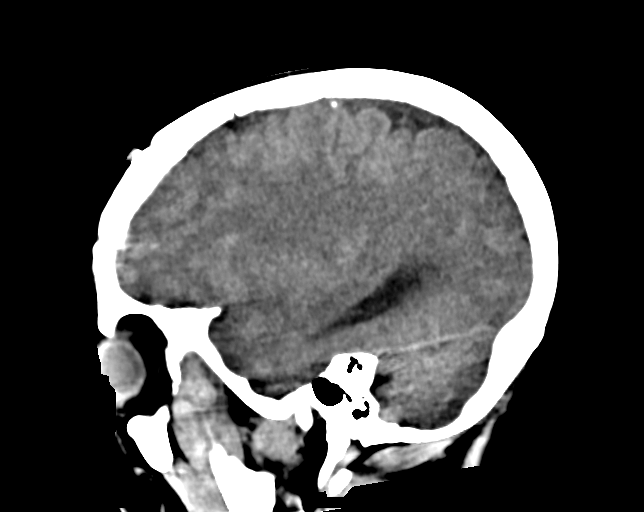
[im 30/59  brain]
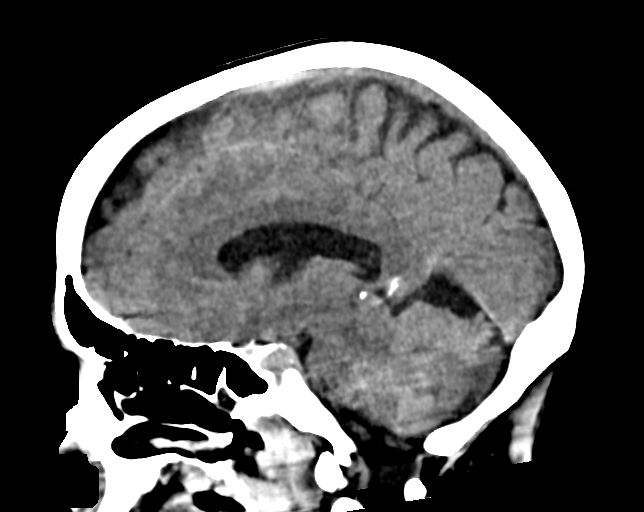
[im 39/59  brain]
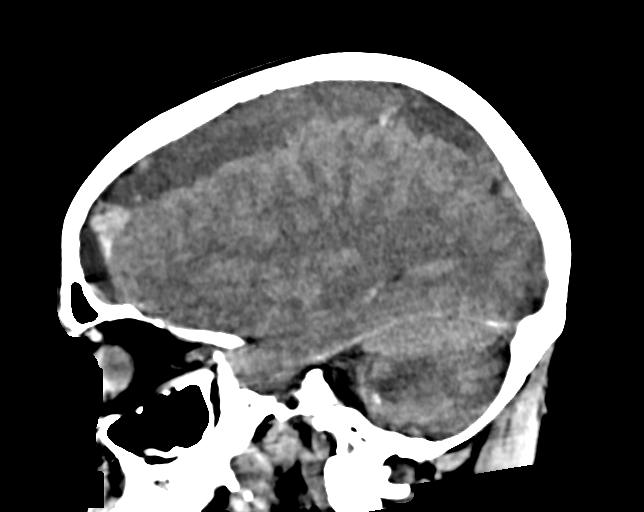

[Series 6: head 3.00 hr40 s3 cor · coronal · 0.35mm/px · 3 of 78 slices shown]
[im 26/78  brain]
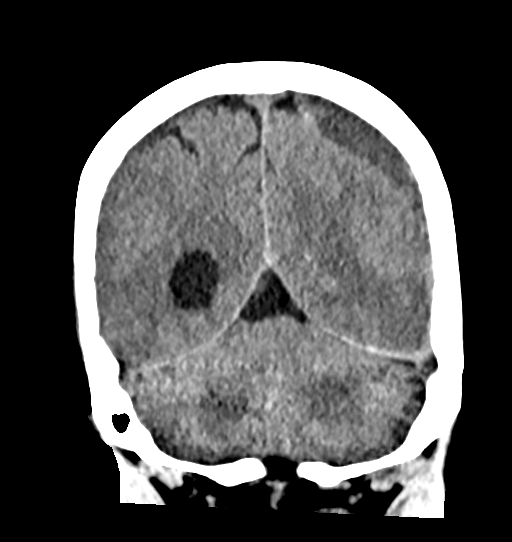
[im 35/78  brain]
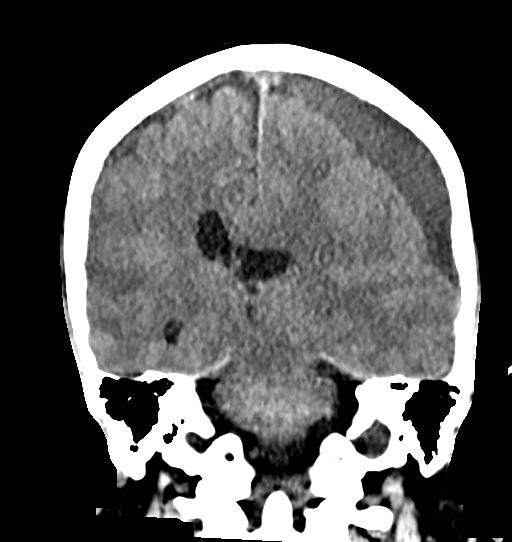
[im 43/78  brain]
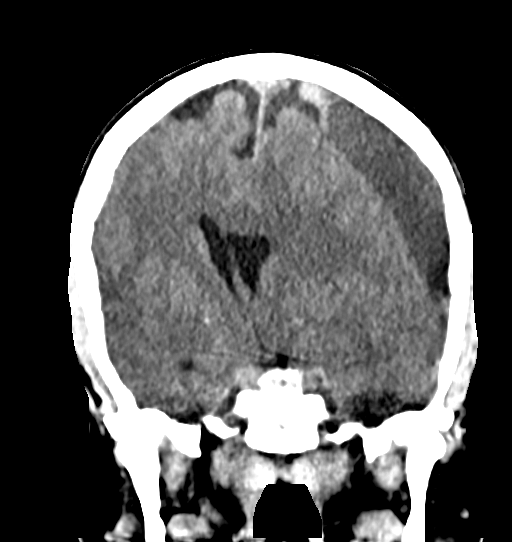

[16 of 47 positions shown; findings below may reference images not displayed]

FINDINGS: Brain: There is a 1.8 cm maximally thick predominantly hypodense
subdural collection overlying the left cerebral convexity. There are
hyperdense blood products within the collection anteriorly
suggesting acute on subacute/chronic hematoma. A collection was
present on the brain MRI from [DATE], but this collection is
significantly increased in size.

The collection exerts significant mass effect on the brain
parenchyma with partial effacement of the left lateral ventricle and
up to 1.1 cm rightward midline shift.

There is a trace residual subdural collection on the right measuring
up to approximately 3 mm in thickness, not significantly changed
compared to the prior MRI.

There is no evidence of acute infarct.  There is no solid lesion.

Vascular: No hyperdense vessel or unexpected calcification.

Skull: Postsurgical changes reflecting right frontal craniotomy are
noted. There is no calvarial fracture. There is no suspicious
osseous lesion. Postsurgical changes are also noted in the bilateral
maxillary sinuses.

Sinuses/Orbits: The paranasal sinuses are clear. The globes and
orbits are unremarkable.

Other: None.
IMPRESSION: Compared to the brain MRI from [DATE].

[DATE]. Significant interval increase in size of a left subdural hematoma
with acute blood products consistent with acute on subacute/chronic
subdural hematoma, with significant mass effect on the brain
parenchyma resulting in 1.1 cm rightward midline shift.
2. Trace right subdural collection, not significantly changed in
size.

These results were called by telephone at the time of interpretation
on [DATE] at [DATE] to provider ETOIL , who verbally
acknowledged these results.

## 2021-06-04 SURGERY — CRANIOTOMY HEMATOMA EVACUATION SUBDURAL
Anesthesia: General | Laterality: Left

## 2021-06-04 MED ORDER — ONDANSETRON HCL 4 MG PO TABS: 4.0000 mg | ORAL_TABLET | ORAL | Status: DC | PRN

## 2021-06-04 MED ORDER — FENTANYL CITRATE (PF) 250 MCG/5ML IJ SOLN
INTRAMUSCULAR | Status: AC
  Filled 2021-06-04: qty 5

## 2021-06-04 MED ORDER — ONDANSETRON HCL 4 MG/2ML IJ SOLN: 4.0000 mg | INTRAMUSCULAR | Status: DC | PRN

## 2021-06-04 MED ORDER — BACITRACIN ZINC 500 UNIT/GM EX OINT
TOPICAL_OINTMENT | CUTANEOUS | Status: DC | PRN
  Administered 2021-06-04 (×2): 1 via TOPICAL

## 2021-06-04 MED ORDER — DASATINIB 50 MG PO TABS: 50.0000 mg | ORAL_TABLET | Freq: Every day | ORAL | Status: DC

## 2021-06-04 MED ORDER — CEFAZOLIN SODIUM-DEXTROSE 2-4 GM/100ML-% IV SOLN
2.0000 g | Freq: Once | INTRAVENOUS | Status: AC
  Administered 2021-06-04: 2 g via INTRAVENOUS

## 2021-06-04 MED ORDER — THROMBIN 20000 UNITS EX SOLR
CUTANEOUS | Status: AC
  Filled 2021-06-04: qty 20000

## 2021-06-04 MED ORDER — LEVETIRACETAM IN NACL 500 MG/100ML IV SOLN
500.0000 mg | Freq: Once | INTRAVENOUS | Status: AC
  Administered 2021-06-04: 500 mg via INTRAVENOUS
  Filled 2021-06-04: qty 100

## 2021-06-04 MED ORDER — DEXAMETHASONE SODIUM PHOSPHATE 10 MG/ML IJ SOLN
6.0000 mg | Freq: Four times a day (QID) | INTRAMUSCULAR | Status: AC
  Administered 2021-06-04 – 2021-06-05 (×4): 6 mg via INTRAVENOUS
  Filled 2021-06-04 (×4): qty 1

## 2021-06-04 MED ORDER — ORAL CARE MOUTH RINSE: 15.0000 mL | Freq: Once | OROMUCOSAL | Status: AC

## 2021-06-04 MED ORDER — PHENYLEPHRINE 40 MCG/ML (10ML) SYRINGE FOR IV PUSH (FOR BLOOD PRESSURE SUPPORT)
PREFILLED_SYRINGE | INTRAVENOUS | Status: DC | PRN
  Administered 2021-06-04: 80 ug via INTRAVENOUS

## 2021-06-04 MED ORDER — PROPOFOL 10 MG/ML IV BOLUS
INTRAVENOUS | Status: DC | PRN
Start: 2021-06-04 — End: 2021-06-04
  Administered 2021-06-04: 150 mg via INTRAVENOUS

## 2021-06-04 MED ORDER — ACETAMINOPHEN 650 MG RE SUPP: 650.0000 mg | RECTAL | Status: DC | PRN

## 2021-06-04 MED ORDER — DEXAMETHASONE SODIUM PHOSPHATE 4 MG/ML IJ SOLN
4.0000 mg | Freq: Four times a day (QID) | INTRAMUSCULAR | Status: DC
  Administered 2021-06-05: 4 mg via INTRAVENOUS
  Filled 2021-06-04: qty 1

## 2021-06-04 MED ORDER — VERAPAMIL HCL ER 240 MG PO TBCR
240.0000 mg | EXTENDED_RELEASE_TABLET | Freq: Every day | ORAL | Status: DC
  Administered 2021-06-04: 240 mg via ORAL
  Filled 2021-06-04 (×2): qty 1

## 2021-06-04 MED ORDER — CHLORHEXIDINE GLUCONATE 0.12 % MT SOLN: 15.0000 mL | Freq: Once | OROMUCOSAL | Status: AC

## 2021-06-04 MED ORDER — SENNA 8.6 MG PO TABS
1.0000 | ORAL_TABLET | Freq: Two times a day (BID) | ORAL | Status: DC
  Administered 2021-06-04 – 2021-06-05 (×2): 8.6 mg via ORAL
  Filled 2021-06-04 (×2): qty 1

## 2021-06-04 MED ORDER — THROMBIN 20000 UNITS EX SOLR
CUTANEOUS | Status: DC | PRN
  Administered 2021-06-04: 20 mL via TOPICAL

## 2021-06-04 MED ORDER — HEMOSTATIC AGENTS (NO CHARGE) OPTIME
TOPICAL | Status: DC | PRN
Start: 2021-06-04 — End: 2021-06-04
  Administered 2021-06-04: 1 via TOPICAL

## 2021-06-04 MED ORDER — LEVOTHYROXINE SODIUM 75 MCG PO TABS
175.0000 ug | ORAL_TABLET | Freq: Every day | ORAL | Status: DC
  Administered 2021-06-05: 175 ug via ORAL
  Filled 2021-06-04: qty 1

## 2021-06-04 MED ORDER — DEXAMETHASONE SODIUM PHOSPHATE 4 MG/ML IJ SOLN: 4.0000 mg | Freq: Three times a day (TID) | INTRAMUSCULAR | Status: DC

## 2021-06-04 MED ORDER — PROPOFOL 10 MG/ML IV BOLUS
INTRAVENOUS | Status: AC
  Filled 2021-06-04: qty 20

## 2021-06-04 MED ORDER — SODIUM CHLORIDE 0.9 % IV SOLN: INTRAVENOUS | Status: DC

## 2021-06-04 MED ORDER — LABETALOL HCL 5 MG/ML IV SOLN
10.0000 mg | INTRAVENOUS | Status: DC | PRN
  Administered 2021-06-04 (×2): 20 mg via INTRAVENOUS
  Filled 2021-06-04 (×2): qty 4

## 2021-06-04 MED ORDER — LIDOCAINE 2% (20 MG/ML) 5 ML SYRINGE
INTRAMUSCULAR | Status: DC | PRN
  Administered 2021-06-04: 60 mg via INTRAVENOUS

## 2021-06-04 MED ORDER — DEXAMETHASONE SODIUM PHOSPHATE 10 MG/ML IJ SOLN
INTRAMUSCULAR | Status: DC | PRN
Start: 2021-06-04 — End: 2021-06-04
  Administered 2021-06-04: 10 mg via INTRAVENOUS

## 2021-06-04 MED ORDER — THROMBIN 5000 UNITS EX SOLR
CUTANEOUS | Status: AC
  Filled 2021-06-04: qty 5000

## 2021-06-04 MED ORDER — ACETAMINOPHEN 325 MG PO TABS: 650.0000 mg | ORAL_TABLET | ORAL | Status: DC | PRN

## 2021-06-04 MED ORDER — THROMBIN 5000 UNITS EX SOLR
OROMUCOSAL | Status: DC | PRN
  Administered 2021-06-04: 5 mL via TOPICAL

## 2021-06-04 MED ORDER — CHLORHEXIDINE GLUCONATE 0.12 % MT SOLN
OROMUCOSAL | Status: AC
  Administered 2021-06-04: 15 mL via OROMUCOSAL
  Filled 2021-06-04: qty 15

## 2021-06-04 MED ORDER — SUGAMMADEX SODIUM 200 MG/2ML IV SOLN
INTRAVENOUS | Status: DC | PRN
  Administered 2021-06-04: 200 mg via INTRAVENOUS

## 2021-06-04 MED ORDER — ROCURONIUM BROMIDE 10 MG/ML (PF) SYRINGE
PREFILLED_SYRINGE | INTRAVENOUS | Status: AC
  Filled 2021-06-04: qty 20

## 2021-06-04 MED ORDER — FENTANYL CITRATE (PF) 250 MCG/5ML IJ SOLN
INTRAMUSCULAR | Status: DC | PRN
  Administered 2021-06-04: 100 ug via INTRAVENOUS
  Administered 2021-06-04: 50 ug via INTRAVENOUS

## 2021-06-04 MED ORDER — PROMETHAZINE HCL 25 MG PO TABS: 12.5000 mg | ORAL_TABLET | ORAL | Status: DC | PRN

## 2021-06-04 MED ORDER — LEVETIRACETAM IN NACL 500 MG/100ML IV SOLN
500.0000 mg | Freq: Two times a day (BID) | INTRAVENOUS | Status: DC
  Administered 2021-06-05: 500 mg via INTRAVENOUS
  Filled 2021-06-04 (×2): qty 100

## 2021-06-04 MED ORDER — ROCURONIUM BROMIDE 10 MG/ML (PF) SYRINGE
PREFILLED_SYRINGE | INTRAVENOUS | Status: DC | PRN
  Administered 2021-06-04: 30 mg via INTRAVENOUS
  Administered 2021-06-04: 70 mg via INTRAVENOUS
  Administered 2021-06-04: 20 mg via INTRAVENOUS

## 2021-06-04 MED ORDER — CEFAZOLIN SODIUM-DEXTROSE 1-4 GM/50ML-% IV SOLN
1.0000 g | Freq: Three times a day (TID) | INTRAVENOUS | Status: AC
  Administered 2021-06-05 (×2): 1 g via INTRAVENOUS
  Filled 2021-06-04 (×2): qty 50

## 2021-06-04 MED ORDER — MORPHINE SULFATE (PF) 2 MG/ML IV SOLN
1.0000 mg | INTRAVENOUS | Status: DC | PRN
  Administered 2021-06-04: 2 mg via INTRAVENOUS
  Filled 2021-06-04: qty 1

## 2021-06-04 MED ORDER — POTASSIUM CHLORIDE IN NACL 20-0.9 MEQ/L-% IV SOLN
INTRAVENOUS | Status: DC
  Filled 2021-06-04 (×3): qty 1000

## 2021-06-04 MED ORDER — LOSARTAN POTASSIUM 50 MG PO TABS
50.0000 mg | ORAL_TABLET | Freq: Every day | ORAL | Status: DC
  Administered 2021-06-05: 50 mg via ORAL
  Filled 2021-06-04: qty 1

## 2021-06-04 MED ORDER — PHENYLEPHRINE HCL-NACL 20-0.9 MG/250ML-% IV SOLN
INTRAVENOUS | Status: DC | PRN
  Administered 2021-06-04: 25 ug/min via INTRAVENOUS

## 2021-06-04 MED ORDER — LIDOCAINE 2% (20 MG/ML) 5 ML SYRINGE
INTRAMUSCULAR | Status: AC
  Filled 2021-06-04: qty 5

## 2021-06-04 MED ORDER — BACITRACIN ZINC 500 UNIT/GM EX OINT
TOPICAL_OINTMENT | CUTANEOUS | Status: AC
  Filled 2021-06-04: qty 28.35

## 2021-06-04 MED ORDER — 0.9 % SODIUM CHLORIDE (POUR BTL) OPTIME
TOPICAL | Status: DC | PRN
  Administered 2021-06-04: 3000 mL

## 2021-06-04 MED ORDER — ONDANSETRON HCL 4 MG/2ML IJ SOLN
INTRAMUSCULAR | Status: AC
  Filled 2021-06-04: qty 2

## 2021-06-04 MED ORDER — THIAMINE HCL 100 MG PO TABS
100.0000 mg | ORAL_TABLET | Freq: Every day | ORAL | Status: DC
  Filled 2021-06-04: qty 1

## 2021-06-04 MED ORDER — ONDANSETRON HCL 4 MG/2ML IJ SOLN
INTRAMUSCULAR | Status: DC | PRN
  Administered 2021-06-04: 4 mg via INTRAVENOUS

## 2021-06-04 MED ORDER — LIDOCAINE-EPINEPHRINE 1 %-1:100000 IJ SOLN
INTRAMUSCULAR | Status: DC | PRN
  Administered 2021-06-04: 10 mL

## 2021-06-04 MED ORDER — HYDROCODONE-ACETAMINOPHEN 5-325 MG PO TABS
1.0000 | ORAL_TABLET | ORAL | Status: DC | PRN
  Administered 2021-06-04 – 2021-06-05 (×2): 1 via ORAL
  Filled 2021-06-04 (×2): qty 1

## 2021-06-04 MED ORDER — LIDOCAINE-EPINEPHRINE 1 %-1:100000 IJ SOLN
INTRAMUSCULAR | Status: AC
  Filled 2021-06-04: qty 1

## 2021-06-04 MED ORDER — ALUM & MAG HYDROXIDE-SIMETH 200-200-20 MG/5ML PO SUSP
30.0000 mL | ORAL | Status: DC | PRN
  Filled 2021-06-04: qty 30

## 2021-06-04 MED ORDER — CEFAZOLIN SODIUM-DEXTROSE 2-4 GM/100ML-% IV SOLN
INTRAVENOUS | Status: AC
  Filled 2021-06-04: qty 100

## 2021-06-04 MED ORDER — PANTOPRAZOLE SODIUM 40 MG IV SOLR
40.0000 mg | Freq: Every day | INTRAVENOUS | Status: DC
  Administered 2021-06-04: 40 mg via INTRAVENOUS
  Filled 2021-06-04: qty 40

## 2021-06-04 SURGICAL SUPPLY — 57 items
BAG COUNTER SPONGE SURGICOUNT (BAG) ×6 IMPLANT
BAND RUBBER #18 3X1/16 STRL (MISCELLANEOUS) IMPLANT
BUR SPIRAL ROUTER 2.3 (BUR) ×2 IMPLANT
CANISTER SUCT 3000ML PPV (MISCELLANEOUS) ×2 IMPLANT
CATH VENTRIC 35X38 W/TROCAR LG (CATHETERS) ×2 IMPLANT
CLIP VESOCCLUDE MED 6/CT (CLIP) IMPLANT
DRAPE MICROSCOPE LEICA (MISCELLANEOUS) IMPLANT
DRAPE NEUROLOGICAL W/INCISE (DRAPES) ×2 IMPLANT
DRAPE SURG 17X23 STRL (DRAPES) IMPLANT
DRAPE WARM FLUID 44X44 (DRAPES) ×2 IMPLANT
DURAPREP 6ML APPLICATOR 50/CS (WOUND CARE) ×2 IMPLANT
ELECT REM PT RETURN 9FT ADLT (ELECTROSURGICAL) ×2
ELECTRODE REM PT RTRN 9FT ADLT (ELECTROSURGICAL) ×1 IMPLANT
EVACUATOR 1/8 PVC DRAIN (DRAIN) IMPLANT
GAUZE 4X4 16PLY ~~LOC~~+RFID DBL (SPONGE) ×4 IMPLANT
GAUZE SPONGE 4X4 12PLY STRL (GAUZE/BANDAGES/DRESSINGS) ×2 IMPLANT
GAUZE SPONGE 4X4 12PLY STRL LF (GAUZE/BANDAGES/DRESSINGS) ×2 IMPLANT
GLOVE SURG ENC MOIS LTX SZ7 (GLOVE) IMPLANT
GLOVE SURG ENC MOIS LTX SZ8 (GLOVE) ×2 IMPLANT
GLOVE SURG UNDER POLY LF SZ7 (GLOVE) IMPLANT
GOWN STRL REUS W/ TWL LRG LVL3 (GOWN DISPOSABLE) IMPLANT
GOWN STRL REUS W/ TWL XL LVL3 (GOWN DISPOSABLE) IMPLANT
GOWN STRL REUS W/TWL 2XL LVL3 (GOWN DISPOSABLE) IMPLANT
GOWN STRL REUS W/TWL LRG LVL3 (GOWN DISPOSABLE)
GOWN STRL REUS W/TWL XL LVL3 (GOWN DISPOSABLE)
HEMOSTAT POWDER KIT SURGIFOAM (HEMOSTASIS) ×2 IMPLANT
KIT BASIN OR (CUSTOM PROCEDURE TRAY) ×2 IMPLANT
KIT DRAIN CSF ACCUDRAIN (MISCELLANEOUS) ×2 IMPLANT
KIT TURNOVER KIT B (KITS) ×2 IMPLANT
NEEDLE HYPO 22GX1.5 SAFETY (NEEDLE) ×2 IMPLANT
NS IRRIG 1000ML POUR BTL (IV SOLUTION) ×6 IMPLANT
PACK CRANIOTOMY CUSTOM (CUSTOM PROCEDURE TRAY) ×2 IMPLANT
PAD ARMBOARD 7.5X6 YLW CONV (MISCELLANEOUS) ×2 IMPLANT
PATTIES SURGICAL .25X.25 (GAUZE/BANDAGES/DRESSINGS) IMPLANT
PATTIES SURGICAL .5 X.5 (GAUZE/BANDAGES/DRESSINGS) IMPLANT
PATTIES SURGICAL .5 X3 (DISPOSABLE) IMPLANT
PATTIES SURGICAL 1X1 (DISPOSABLE) IMPLANT
PERFORATOR LRG  14-11MM (BIT) ×2
PERFORATOR LRG 14-11MM (BIT) ×1 IMPLANT
PIN MAYFIELD SKULL DISP (PIN) ×2 IMPLANT
PLATE CRANIAL CMF 20 (Plate) ×2 IMPLANT
PLATE UNIV CMF 16 2H (Plate) ×6 IMPLANT
SCREW UNIII AXS SD 1.5X4 (Screw) ×22 IMPLANT
SPONGE NEURO XRAY DETECT 1X3 (DISPOSABLE) IMPLANT
SPONGE SURGIFOAM ABS GEL 100 (HEMOSTASIS) ×4 IMPLANT
SPONGE T-LAP 4X18 ~~LOC~~+RFID (SPONGE) ×2 IMPLANT
STAPLER VISISTAT 35W (STAPLE) ×2 IMPLANT
SUT ETHILON 3 0 FSL (SUTURE) IMPLANT
SUT NURALON 4 0 TR CR/8 (SUTURE) ×4 IMPLANT
SUT VIC AB 2-0 CP2 18 (SUTURE) ×4 IMPLANT
SYR CONTROL 10ML LL (SYRINGE) ×2 IMPLANT
TAPE PAPER 3X10 WHT MICROPORE (GAUZE/BANDAGES/DRESSINGS) ×2 IMPLANT
TOWEL GREEN STERILE (TOWEL DISPOSABLE) ×2 IMPLANT
TOWEL GREEN STERILE FF (TOWEL DISPOSABLE) ×2 IMPLANT
TRAY FOLEY MTR SLVR 16FR STAT (SET/KITS/TRAYS/PACK) ×2 IMPLANT
UNDERPAD 30X36 HEAVY ABSORB (UNDERPADS AND DIAPERS) IMPLANT
WATER STERILE IRR 1000ML POUR (IV SOLUTION) ×2 IMPLANT

## 2021-06-04 NOTE — Progress Notes (Addendum)
NEUROLOGY FOLLOW UP OFFICE NOTE  Christina [name] [id number]  Assessment/Plan:   Worsening headaches - Small chronic subdural blood appeared stable on prior imaging, but due to the significant worsening of her headaches, as well as new symptoms such as nausea and blurred vision, must evaluate for new/worsening of subdural hematoma. History of traumatic subdural hematoma  Ordered STAT CT head, personally reviewed, which revealed increased left chronic/subacute subdural hematoma with acute blood causing significant mass effect and rightward midline shift.  Patient and her husband returned to the office to discuss results.  Patient and husband declined EMS transport and husband will take patient to [facility] ED across the street immediately.  ED has been contacted and aware of patient's arrival.    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  UPDATE: MRI brain with and without contrast on 05/02/2021 personally reviewed showed stable bilateral small chronic subdural hematomas (up to 52mm on right, trace on left), which appeared stable compared to prior CT on 04/08/2021, but no acute intracranial abnormalities.  MRA of head personally reviewed was unremarkable.  She was started on nortriptyline 10mg  at bedtime last month.  Headaches seemed to improve.  Over the past couple of weeks, they have gotten worse.  They are quite severe, throbbing across the forehead.  They are aggravated with bending and valsalva but still quite severe even when still or laying down.  She now notes some blurred vision and nausea.  She feels unsteady on her feet and has trouble ambulating.  Nortriptyline was increased to 25mg  earlier this week.  She started a Medrol dose pak but stopped after a couple of days as it was ineffective.  She has been treating headaches with Tylenol.   HISTORY: She has a history of medication-related non-traumatic  subdural hematoma for which she underwent right craniotomy in November 2017.     She sustained a head injury in May 2022 when she tripped over a suitcase while walking in the dark at night to use the bathroom and striking the left forehead on the table.  No loss of consciousness, headache, nausea, vomiting or vision changes.  Sustained external hematoma and swelling.  She felt fine until July when she developed increased photophobia and phonophobia.  In August, she developed a new onset headache.  She describes a moderate bifrontal head pressure that becomes severe when coughing or bending over.  Lasts about 30 minutes and returns to baseline moderate headache.  No nausea, vomiting, or visual disturbance.  Seems to be worse at night but cannot say that it is worse or better when supine or upright.  CT head on 04/08/2021 showed plate fixation over the right frontal bone and small chronic bilateral subdural blood but no acute intracranial abnormalities.  She has been treating with Advil daily.    PAST MEDICAL HISTORY: Past Medical History:  Diagnosis Date   Allergic rhinitis    Arthritis    Cleft palate    COVID-19 07/31/2019   tested positive, states mild aches/pains, fatigue   Diarrhea due to drug 06/30/2015   Fibroid    Grave's disease    H/O insomnia    H/O menorrhagia 2012   History of chicken pox    Hypertension    Hyperthyroidism    s/p rai 8/06 now hypothyroid Dr. 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 JO   Sleep apnea    has cpap does not use   Vitamin D deficiency     MEDICATIONS: Current Outpatient Medications on File Prior to Visit  Medication Sig Dispense Refill   azithromycin (ZITHROMAX) 250 MG tablet Take 2 tablets on day 1, then 1 tablet daily on days 2 through 5 6 tablet 0   dasatinib (SPRYCEL)  50 MG tablet TAKE 1 TABLET BY MOUTH ONCE DAILY 30 tablet 11   diazepam (VALIUM) 5 MG tablet Take 1 tablet by mouth 30-40 minutes prior to MRI 1 tablet 0   losartan (COZAAR) 50 MG tablet Take 1 tablet (50 mg total) by mouth daily. 90 tablet 2   methylPREDNISolone (MEDROL DOSEPAK) 4 MG TBPK tablet Take as directed 21 tablet 0   metroNIDAZOLE (METROCREAM) 0.75 % cream Apply 1 application topically daily. 45 g 3   nortriptyline (PAMELOR) 25 MG capsule Take 1 capsule (25 mg total) by mouth at bedtime. 30 capsule 3   SYNTHROID 175 MCG tablet TAKE 1 TABLET (175 MCG TOTAL) BY MOUTH DAILY BEFORE BREAKFAST. 90 tablet 2   verapamil (CALAN-SR) 240 MG CR tablet TAKE 1 TABLET BY MOUTH AT BEDTIME. 90 tablet 3   zolpidem (AMBIEN) 10 MG tablet TAKE 1 TABLET BY MOUTH AT BEDTIME AS NEEDED FOR SLEEP 90 tablet 0   No current facility-administered medications on file prior to visit.    ALLERGIES: No Known Allergies  FAMILY HISTORY: Family History  Problem Relation Age of Onset   Diabetes Mother    Cancer Mother 50       ovarian ca   Breast cancer Mother    Hypertension Other    Prostate cancer Other        grandfather   Hypertension Father    Cancer Maternal Uncle        leukemia   Colon cancer Neg Hx    Esophageal cancer Neg Hx    Rectal cancer Neg Hx    Stomach cancer Neg Hx    Colon polyps Neg Hx       Objective:  Blood pressure (!) 145/82, pulse 67, height 5\' 7"  (1.702 m), weight 165 lb (74.8 kg), SpO2 97 %. General: Mild distress  Patient appears well-groomed.   Head:  Normocephalic/atraumatic Eyes:  Fundi examined but not visualized Neck: supple, no paraspinal tenderness, full range of motion Heart:  Regular rate and rhythm Lungs:  Clear to auscultation bilaterally Back: No paraspinal tenderness Neurological Exam: alert and oriented to person, place, and time.  Speech fluent and not dysarthric, language intact.  CN II-XII intact. Bulk and tone normal, muscle strength 5/5 throughout.   Finger-thumb tapping speed slower on right.  Sensation to pinprick and vibration intact.  Deep tendon reflexes 2+ throughout, toes downgoing.  Finger to nose testing intact.  Cautious and unsteady broad-based gait.  Romberg with sway.     Metta Clines, DO  CC: Shanon Ace, MD

## 2021-06-04 NOTE — ED Notes (Signed)
Going to SS [facility]

## 2021-06-04 NOTE — Progress Notes (Signed)
Spoke with Thurmond Butts the Camera operator at [facility] ER and informed him that Mrs Christina [name] 60 year old female would be coming over via Car by her husband Dr Sarajane Jews. She had a SAT CT done today and she has a subdural bleed. Thurmond Butts stated that they would be looking out for her to show up,

## 2021-06-04 NOTE — ED Notes (Signed)
Not in room

## 2021-06-04 NOTE — ED Triage Notes (Signed)
Patient sent to [facility] for further evaluation of subdural hematoma, patient reports a suffering a head injury a few months ago and has headaches every since. No LOC. Pupils equal, round, and reactive to light. Patient alert, oriented, and in no apparent distress at this time.

## 2021-06-04 NOTE — H&P (Signed)
Christina [name] is an 60 y.o. female.   HPI:  59 year old female presented to the ED today after having severe headaches over the last couple months that got progressively worse in the last two days. She had a SDH in 2017 on the right when she had a craniotomy. She did have a reacumulation the next day when she was taken back for a repeat craniotomy for evacuation. She has a history of leukemia. She complains of headaches today but no blurry vision, NV or dizziness. Denies any recent falls or trauma to her head. Denies taking any blood thinners. She had a recent CT scan and MRI of her head just a month ago which maybe showed a very small bilateral chronic sdh about 107mm in size.   Past Medical History:  Diagnosis Date   Allergic rhinitis    Arthritis    Cleft palate    COVID-19 07/31/2019   tested positive, states mild aches/pains, fatigue   Diarrhea due to drug 06/30/2015   Fibroid    Grave's disease    H/O insomnia    H/O menorrhagia 2012   History of chicken pox    Hypertension    Hyperthyroidism    s/p rai 8/06 now hypothyroid Dr. 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 JO   Sleep apnea    has cpap does not use   Vitamin D deficiency     Past Surgical History:  Procedure Laterality Date   BUNIONECTOMY  1986   CRANIOTOMY N/A 06/17/2016   Procedure: RIGHT CRANIOTOMY HEMATOMA EVACUATION SUBDURAL;  Surgeon: Eustace Moore, MD;  Location: [place];  Service: Neurosurgery;  Laterality: N/A;   CRANIOTOMY Right 06/18/2016   Procedure: CRANIOTOMY HEMATOMA EVACUATION SUBDURAL RIGHT;  Surgeon: Eustace Moore, MD;  Location: [place];  Service: Neurosurgery;  Laterality: Right;   DILATION AND CURETTAGE OF UTERUS  09/15/2010   HYSTEROSCOPY WITH D & C  09/28/2010   MOUTH SURGERY     NOVASURE ABLATION  09/28/2010   REFRACTIVE  SURGERY     for vision   TONSILLECTOMY  1965    No Known Allergies  Social History   Tobacco Use   Smoking status: Never   Smokeless tobacco: Never  Substance Use Topics   Alcohol use: Yes    Comment: socially    Family History  Problem Relation Age of Onset   Diabetes Mother    Cancer Mother 79       ovarian ca   Breast cancer Mother    Hypertension Other    Prostate cancer Other        grandfather   Hypertension Father    Cancer Maternal Uncle        leukemia   Colon cancer Neg Hx    Esophageal cancer Neg Hx    Rectal cancer Neg Hx    Stomach cancer Neg Hx    Colon polyps Neg Hx      Review of Systems  Positive ROS: as above  All other systems have been reviewed and were otherwise negative with the exception of those mentioned in the HPI and as above.  Objective: Vital signs in last 24 hours: Temp:  [97.4 F (36.3 C)] 97.4 F (36.3 C) (10/28 1252) Pulse Rate:  [67-76] 76 (  10/28 1330) Resp:  [15-18] 17 (10/28 1330) BP: (145-167)/(82-94) 167/88 (10/28 1330) SpO2:  [97 %-100 %] 99 % (10/28 1330) Weight:  [74.8 kg] 74.8 kg (10/28 1125)  General Appearance: Alert, cooperative, no distress, appears stated age Head: Normocephalic, without obvious abnormality, atraumatic Eyes: PERRL, conjunctiva/corneas clear, EOM's intact, fundi benign, both eyes      Neck: Supple, symmetrical, trachea midline, no adenopathy; thyroid: No enlargement/tenderness/nodules; no carotid bruit or JVD Lungs: respirations unlabored Heart: Regular rate and rhythm Extremities: Extremities normal, atraumatic, no cyanosis or edema Pulses: 2+ and symmetric all extremities Skin: Skin color, texture, turgor normal, no rashes or lesions  NEUROLOGIC:   Mental status: A&O x4, no aphasia, good attention span, Memory and fund of knowledge Motor Exam - grossly normal, normal tone and bulk Sensory Exam - grossly normal Reflexes: symmetric, no pathologic reflexes, No Hoffman's, No  clonus Coordination - grossly normal Gait - grossly normal Balance - grossly normal Cranial Nerves: I: smell Not tested  II: visual acuity  OS: na    OD: na  II: visual fields Full to confrontation  II: pupils Equal, round, reactive to light  III,VII: ptosis None  III,IV,VI: extraocular muscles  Full ROM  V: mastication Normal  V: facial light touch sensation  Normal  V,VII: corneal reflex  Present  VII: facial muscle function - upper  Normal  VII: facial muscle function - lower Normal  VIII: hearing Not tested  IX: soft palate elevation  Normal  IX,X: gag reflex Present  XI: trapezius strength  5/5  XI: sternocleidomastoid strength 5/5  XI: neck flexion strength  5/5  XII: tongue strength  Normal    Data Review Lab Results  Component Value Date   WBC 9.6 06/04/2021   HGB 13.5 06/04/2021   HCT 39.2 06/04/2021   MCV 92.5 06/04/2021   PLT 292 06/04/2021   Lab Results  Component Value Date   NA 128 (L) 06/04/2021   K 3.7 06/04/2021   CL 98 06/04/2021   CO2 22 06/04/2021   BUN 21 (H) 06/04/2021   CREATININE 0.64 06/04/2021   GLUCOSE 108 (H) 06/04/2021   Lab Results  Component Value Date   INR 0.9 06/04/2021    Radiology: CT HEAD WO CONTRAST (5MM)  Result Date: 06/04/2021 CLINICAL DATA:  Headache for 3 days, injury to the head in May 2022, history of craniotomy EXAM: CT HEAD WITHOUT CONTRAST TECHNIQUE: Contiguous axial images were obtained from the base of the skull through the vertex without intravenous contrast. COMPARISON:  Brain MRI 05/02/2021 FINDINGS: Brain: There is a 1.8 cm maximally thick predominantly hypodense subdural collection overlying the left cerebral convexity. There are hyperdense blood products within the collection anteriorly suggesting acute on subacute/chronic hematoma. A collection was present on the brain MRI from 05/02/2021, but this collection is significantly increased in size. The collection exerts significant mass effect on the brain  parenchyma with partial effacement of the left lateral ventricle and up to 1.1 cm rightward midline shift. There is a trace residual subdural collection on the right measuring up to approximately 3 mm in thickness, not significantly changed compared to the prior MRI. There is no evidence of acute infarct.  There is no solid lesion. Vascular: No hyperdense vessel or unexpected calcification. Skull: Postsurgical changes reflecting right frontal craniotomy are noted. There is no calvarial fracture. There is no suspicious osseous lesion. Postsurgical changes are also noted in the bilateral maxillary sinuses. Sinuses/Orbits: The paranasal sinuses are clear. The globes and orbits are unremarkable.  Other: None. IMPRESSION: Compared to the brain MRI from 05/02/2021. 1. 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 2. Trace right subdural collection, not significantly changed in size. These results were called by telephone at the time of interpretation on 06/04/2021 at 12:21 pm to provider ADAM JAFFE , who verbally acknowledged these results. Electronically Signed   By: Valetta Mole M.D.   On: 06/04/2021 12:22     Assessment/Plan: 60 year old female presented to the ED today after having progressive headaches. CT head shows a very large left sided chronic holohemispheric SDH causing midline shift of about a centimeter and mass effect. I do think that she needs to have a craniotomy for evacuation of the SDH. We discussed all the risks and benefits associated with the surgery and she agrees to move forward. Risks include reacumulation of the sdh, infection, difficulty with speech, failure of relief from symptoms, right sided weakness. Her husband is at the bedside as well.    Christina [name] Miriya Cloer 06/04/2021 2:27  PM

## 2021-06-04 NOTE — Transfer of Care (Signed)
Immediate Anesthesia Transfer of Care Note  Patient: Christina [name]  Procedure(s) Performed: CRANIOTOMY HEMATOMA EVACUATION SUBDURAL (Left)  Patient Location: PACU  Anesthesia Type:General  Level of Consciousness: awake, alert , oriented and patient cooperative  Airway & Oxygen Therapy: Patient Spontanous Breathing  Post-op Assessment: Report given to RN, Post -op Vital signs reviewed and stable and Patient moving all extremities  Post vital signs: Reviewed and stable  Last Vitals:  Vitals Value Taken Time  BP    Temp    Pulse    Resp    SpO2      Last Pain:  Vitals:   06/04/21 1520  TempSrc:   PainSc: 5          Complications: No notable events documented.

## 2021-06-04 NOTE — Progress Notes (Signed)
2030 Keppra dose was sent up with patient from pharmacy and given shortly after arrival at [place]. Notified pharmacy of duplicate dose in MAR. See notes in [facility].

## 2021-06-04 NOTE — ED Notes (Signed)
Pt alert, NAD, calm, interactive, resps e/u, speaking clearly, husband at [facility]. Admits HA and nausea, both improved and fluctuate.

## 2021-06-04 NOTE — Progress Notes (Signed)
Pt changed to a visit.

## 2021-06-04 NOTE — Anesthesia Preprocedure Evaluation (Addendum)
Anesthesia Evaluation  Patient identified by MRN, date of birth, ID band Patient awake    Reviewed: Allergy & Precautions, NPO status , Patient's Chart, lab work & pertinent test results  Airway Mallampati: III  TM Distance: >3 FB Neck ROM: Full  Mouth opening: Limited Mouth Opening  Dental no notable dental hx. (+) Dental Advisory Given   Pulmonary sleep apnea and Continuous Positive Airway Pressure Ventilation ,    Pulmonary exam normal breath sounds clear to auscultation       Cardiovascular hypertension, Pt. on medications Normal cardiovascular exam Rhythm:Regular Rate:Normal     Neuro/Psych  Headaches, negative psych ROS   GI/Hepatic negative GI ROS, Neg liver ROS,   Endo/Other  Hypothyroidism   Renal/GU negative Renal ROS  negative genitourinary   Musculoskeletal negative musculoskeletal ROS (+)   Abdominal   Peds  Hematology  (+) Blood dyscrasia, , CML   Anesthesia Other Findings Covid positive  60yo presented with HA found to have SDH. Had a SDH in 2017 requiring evacuation.   Reproductive/Obstetrics                           Anesthesia Physical Anesthesia Plan  ASA: 3 and emergent  Anesthesia Plan: General   Post-op Pain Management:    Induction: Intravenous and Rapid sequence  PONV Risk Score and Plan: 3 and Midazolam, Dexamethasone and Ondansetron  Airway Management Planned: Oral ETT and Video Laryngoscope Planned  Additional Equipment: Arterial line  Intra-op Plan:   Post-operative Plan: Extubation in OR  Informed Consent: I have reviewed the patients History and Physical, chart, labs and discussed the procedure including the risks, benefits and alternatives for the proposed anesthesia with the patient or authorized representative who has indicated his/her understanding and acceptance.     Dental advisory given  Plan Discussed with: CRNA  Anesthesia Plan  Comments:        Anesthesia Quick Evaluation

## 2021-06-04 NOTE — Anesthesia Procedure Notes (Signed)
Procedure Name: Intubation Date/Time: 06/04/2021 4:15 PM Performed by: Myna Bright, CRNA Pre-anesthesia Checklist: Patient identified, Emergency Drugs available, Suction available and Patient being monitored Patient Re-evaluated:Patient Re-evaluated prior to induction Oxygen Delivery Method: Circle system utilized Preoxygenation: Pre-oxygenation with 100% oxygen Induction Type: IV induction Ventilation: Mask ventilation without difficulty Laryngoscope Size: Glidescope and 3 Tube type: Oral Tube size: 7.0 mm Number of attempts: 1 Airway Equipment and Method: Rigid stylet and Video-laryngoscopy Placement Confirmation: ETT inserted through vocal cords under direct vision, positive ETCO2 and breath sounds checked- equal and bilateral Secured at: 21 cm Tube secured with: Tape Dental Injury: Teeth and Oropharynx as per pre-operative assessment  Difficulty Due To: Difficulty was anticipated and Difficult Airway- due to limited oral opening Future Recommendations: Recommend- induction with short-acting agent, and alternative techniques readily available Comments: Easy mask airway. Glidescope used d/t anticipated difficult airway and patient covid positive. Good view with Glide. Atraumatic oral intubation.

## 2021-06-04 NOTE — ED Provider Notes (Signed)
[facility] EMERGENCY DEPARTMENT Provider Note   CSN: [id number] Arrival date & time: 06/04/21  1246     History Chief Complaint  Patient presents with   Head Injury    Christina [name] is a 60 y.o. female.  HPI 60 year old female presents with headache.  She has been dealing with headaches ever since a fall in May 2022.  She had a fairly benign CT and MRI for these headaches last month.  However over the last 3 or 4 days the headaches have been much more severe.  Tylenol seems to partially help.  Headache is mostly frontal and throbbing.  No vision changes or vomiting.  No altered mental status according to husband.  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  Denies any focal weakness or numbness.  Headache is tolerable right now.  Past Medical History:  Diagnosis Date   Allergic rhinitis    Arthritis    Cleft palate    COVID-19 07/31/2019   tested positive, states mild aches/pains, fatigue   Diarrhea due to drug 06/30/2015   Fibroid    Grave's disease    H/O insomnia    H/O menorrhagia 2012   History of chicken pox    Hypertension    Hyperthyroidism    s/p rai 8/06 now hypothyroid Dr. 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   Encounter for chemotherapy management 05/18/2015   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   Essential hypertension 11/27/2014   Shortness of breath 12/03/2013   Hx of Graves' disease 12/03/2013   Special screening for malignant neoplasms, colon 08/09/2013   Insomnia 04/29/2012   Medication management 04/29/2012   Polyp of cervix    Uterine leiomyoma    Vitamin D deficiency    Cubital tunnel syndrome 09/05/2011   TENDINITIS 12/19/2008   HYPERTHYROIDISM 03/06/2007   Hand joint pain 03/05/2007   INSOMNIA 03/05/2007   GRAVE'S DISEASE 03/02/2007   LOSS, HEARING NOS 03/02/2007   Allergic rhinitis 03/02/2007    Past Surgical History:  Procedure Laterality Date   BUNIONECTOMY  1986   CRANIOTOMY N/A 06/17/2016   Procedure: RIGHT CRANIOTOMY HEMATOMA EVACUATION SUBDURAL;  Surgeon: Eustace Moore, MD;  Location: [place];  Service: Neurosurgery;  Laterality: N/A;   CRANIOTOMY Right 06/18/2016   Procedure: CRANIOTOMY HEMATOMA EVACUATION SUBDURAL RIGHT;  Surgeon: Eustace Moore, MD;  Location: [place];  Service: Neurosurgery;  Laterality: Right;   DILATION AND CURETTAGE OF UTERUS  09/15/2010   HYSTEROSCOPY WITH D & C  09/28/2010   MOUTH SURGERY     NOVASURE ABLATION  09/28/2010  REFRACTIVE SURGERY     for vision   TONSILLECTOMY  1965     OB History   No obstetric history on file.     Family History  Problem Relation Age of Onset   Diabetes Mother    Cancer Mother 69       ovarian ca   Breast cancer Mother    Hypertension Other    Prostate cancer Other        grandfather   Hypertension Father    Cancer Maternal Uncle        leukemia   Colon cancer Neg Hx    Esophageal cancer Neg Hx    Rectal cancer Neg Hx    Stomach cancer Neg Hx    Colon polyps Neg Hx     Social History   Tobacco Use   Smoking status: Never   Smokeless tobacco: Never  Substance Use Topics   Alcohol  use: Yes    Comment: socially   Drug use: No    Home Medications Prior to Admission medications   Medication Sig Start Date End Date Taking? Authorizing Provider  acetaminophen (TYLENOL) 325 MG tablet Take 650 mg by mouth every 6 (six) hours as needed for moderate pain or headache.   Yes [provider]  dasatinib (SPRYCEL) 50 MG tablet TAKE 1 TABLET BY MOUTH ONCE DAILY Patient taking differently: Take 50 mg by mouth at bedtime. 06/01/21 06/01/22 Yes Gorsuch, Ni, MD  diazepam (VALIUM) 5 MG tablet Take 1 tablet by mouth 30-40 minutes prior to MRI Patient taking differently: Take 5 mg by mouth See admin instructions. Take 1 tablet by mouth 30-40 minutes prior to MRI 04/19/21  Yes Jaffe, Adam R, DO  losartan (COZAAR) 50 MG tablet Take 1 tablet (50 mg total) by mouth daily. 05/02/21  Yes Panosh, Standley Brooking, MD  metroNIDAZOLE (METROCREAM) 0.75 % cream Apply 1 application topically daily. Patient taking differently: Apply 1 application topically daily as needed (dry skin). 03/10/21  Yes   nortriptyline (PAMELOR) 25 MG capsule Take 1 capsule (25 mg total) by mouth at bedtime. 05/31/21  Yes Jaffe, Adam R, DO  SYNTHROID 175 MCG tablet TAKE 1 TABLET (175 MCG TOTAL) BY MOUTH DAILY BEFORE BREAKFAST. Patient taking differently: Take 175 mcg by mouth daily. 08/14/20 08/16/21 Yes Panosh, Standley Brooking, MD  thiamine (VITAMIN B-1) 100 MG tablet Take 100 mg by mouth daily.   Yes [provider]  verapamil (CALAN-SR) 240 MG CR tablet TAKE 1 TABLET BY MOUTH AT BEDTIME. Patient taking differently: Take 240 mg by mouth at bedtime. 07/06/20 07/06/21 Yes Panosh, Standley Brooking, MD  zolpidem (AMBIEN) 10 MG tablet TAKE 1 TABLET BY MOUTH AT BEDTIME AS NEEDED FOR SLEEP Patient taking differently: Take 10 mg by mouth at bedtime. 05/31/21 11/27/21 Yes Panosh, Standley Brooking, MD  azithromycin (ZITHROMAX) 250 MG tablet Take 2 tablets on day 1, then 1 tablet daily on days 2 through 5 Patient not taking: Reported on 06/04/2021 06/02/21  06/07/21  Dorothyann Peng, NP  methylPREDNISolone (MEDROL DOSEPAK) 4 MG TBPK tablet Take as directed Patient not taking: Reported on 06/04/2021 06/02/21   Dorothyann Peng, NP    Allergies    Patient has no known allergies.  Review of Systems   Review of Systems  Eyes:  Negative for visual disturbance.  Gastrointestinal:  Negative for vomiting.  Neurological:  Positive for headaches. Negative for weakness and numbness.  Psychiatric/Behavioral:  Negative for confusion.   All other systems reviewed and are negative.  Physical  Exam Updated Vital Signs BP (!) 175/93   Pulse 73   Temp (!) 97.4 F (36.3 C) (Oral)   Resp 12   SpO2 99%   Physical Exam Vitals and nursing note reviewed.  Constitutional:      General: She is not in acute distress.    Appearance: She is well-developed. She is not ill-appearing or diaphoretic.  HENT:     Head: Normocephalic and atraumatic.     Right Ear: External ear normal.     Left Ear: External ear normal.     Nose: Nose normal.  Eyes:     General:        Right eye: No discharge.        Left eye: No discharge.     Extraocular Movements: Extraocular movements intact.     Pupils: Pupils are equal, round, and reactive to light.  Cardiovascular:     Rate and Rhythm: Normal rate and regular rhythm.     Heart sounds: Normal heart sounds.  Pulmonary:     Effort: Pulmonary effort is normal.     Breath sounds: Normal breath sounds.  Abdominal:     Palpations: Abdomen is soft.     Tenderness: There is no abdominal tenderness.  Skin:    General: Skin is warm and dry.  Neurological:     Mental Status: She is alert.     Comments: CN 3-12 grossly intact. 5/5 strength in all 4 extremities. Grossly normal sensation. Normal finger to nose.   Psychiatric:        Mood and Affect: Mood is not anxious.    ED Results / Procedures / Treatments   Labs (all labs ordered are listed, but only abnormal results are displayed) Labs Reviewed  RESP PANEL BY RT-PCR  (FLU A&B, COVID) ARPGX2 - Abnormal; Notable for the following components:      Result Value   SARS Coronavirus 2 by RT PCR POSITIVE (*)    All other components within normal limits  COMPREHENSIVE METABOLIC PANEL - Abnormal; Notable for the following components:   Sodium 128 (*)    Glucose, Bld 108 (*)    BUN 21 (*)    All other components within normal limits  CBC WITH DIFFERENTIAL/PLATELET - Abnormal; Notable for the following components:   Neutro Abs 8.1 (*)    All other components within normal limits  PROTIME-INR  APTT  TYPE AND SCREEN  ABO/RH    EKG None  Radiology CT HEAD WO CONTRAST (5MM)  Result Date: 06/04/2021 CLINICAL DATA:  Headache for 3 days, injury to the head in May 2022, history of craniotomy EXAM: CT HEAD WITHOUT CONTRAST TECHNIQUE: Contiguous axial images were obtained from the base of the skull through the vertex without intravenous contrast. COMPARISON:  Brain MRI 05/02/2021 FINDINGS: Brain: There is a 1.8 cm maximally thick predominantly hypodense subdural collection overlying the left cerebral convexity. There are hyperdense blood products within the collection anteriorly suggesting acute on subacute/chronic hematoma. A collection was present on the brain MRI from 05/02/2021, but this collection is significantly increased in size. The collection exerts significant mass effect on the brain parenchyma with partial effacement of the left lateral ventricle and up to 1.1 cm rightward midline shift. There is a trace residual subdural collection on the right measuring up to approximately 3 mm in thickness, not significantly changed compared to the prior MRI. There is no evidence of acute infarct.  There is no solid lesion. Vascular: No hyperdense vessel or unexpected calcification. Skull:  Postsurgical changes reflecting right frontal craniotomy are noted. There is no calvarial fracture. There is no suspicious osseous lesion. Postsurgical changes are also noted in the  bilateral maxillary sinuses. Sinuses/Orbits: The paranasal sinuses are clear. The globes and orbits are unremarkable. Other: None. IMPRESSION: Compared to the brain MRI from 05/02/2021. 1. 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 2. Trace right subdural collection, not significantly changed in size. These results were called by telephone at the time of interpretation on 06/04/2021 at 12:21 pm to provider ADAM JAFFE , who verbally acknowledged these results. Electronically Signed   By: Valetta Mole M.D.   On: 06/04/2021 12:22    Procedures .Critical Care Performed by: Sherwood Gambler, MD Authorized by: Sherwood Gambler, MD   Critical care provider statement:    Critical care time (minutes):  30   Critical care time was exclusive of:  Separately billable procedures and treating other patients   Critical care was necessary to treat or prevent imminent or life-threatening deterioration of the following conditions:  CNS failure or compromise   Critical care was time spent personally by me on the following activities:  Development of treatment plan with patient or surrogate, discussions with consultants, evaluation of patient's response to treatment, examination of patient, obtaining history from patient or surrogate, ordering and performing treatments and interventions, ordering and review of laboratory studies, ordering and review of radiographic studies, pulse oximetry, re-evaluation of patient's condition and review of old charts   Medications Ordered in ED Medications  chlorhexidine (PERIDEX) 0.12 % solution 15 mL (has no administration in time range)    Or  MEDLINE mouth rinse (has no administration in time range)  0.9 %  sodium chloride infusion (has no administration in time range)   chlorhexidine (PERIDEX) 0.12 % solution (has no administration in time range)  ceFAZolin (ANCEF) IVPB 2g/100 mL premix (has no administration in time range)  ceFAZolin (ANCEF) 2-4 GM/100ML-% IVPB (has no administration in time range)    ED Course  I have reviewed the triage vital signs and the nursing notes.  Pertinent labs & imaging results that were available during my care of the patient were reviewed by me and considered in my medical decision making (see chart for details).    MDM Rules/Calculators/A&P                           Patient has no neurodeficits but does have sizable subdural hematoma with mass-effect.  Will be kept NPO.  Neurosurgery consulted and will take to the OR.  Incidentally her COVID test is positive though she does endorse some congestion recently. Final Clinical Impression(s) / ED Diagnoses Final diagnoses:  Subdural hematoma    Rx / DC Orders ED Discharge Orders     None        Sherwood Gambler, MD 06/04/21 1525

## 2021-06-04 NOTE — Op Note (Signed)
06/04/2021  5:50 PM  PATIENT:  Christina [name]  60 y.o. female  PRE-OPERATIVE DIAGNOSIS: Large left chronic subdural hematoma  POST-OPERATIVE DIAGNOSIS:  same  PROCEDURE: Left craniotomy for evacuation of subdural hematoma  SURGEON:  Sherley Bounds, MD  ASSISTANTS: Glenford Peers FNP  ANESTHESIA:   General  EBL: Less than 50 ml  Total I/O In: -  Out: 700 [Urine:400; Blood:300]  BLOOD ADMINISTERED: none  DRAINS: Subdural drain  SPECIMEN:  none  INDICATION FOR PROCEDURE: This patient presented with significant headaches. Imaging showed large left chronic subdural hematoma with mass-effect and shift. The patient tried conservative measures without relief. Recommended left craniotomy for evacuation of the subdural hematoma. Patient understood the risks, benefits, and alternatives and potential outcomes and wished to proceed.  PROCEDURE DETAILS: The patient was taken to the operating room and after induction of adequate generalized endotracheal anesthesia, the head was affixed in a 3 point [place] head rest, and turned to the right to expose the left frontotemporal parietal region. The head was shaved and then cleaned and then prepped with DuraPrep and draped in the usual sterile fashion. 10 cc of local anesthetic was injected, and a curvilinear incision was made on the left at the superior temporal line of the head. Raney clips were placed to establish hemostasis of the scalp, the muscle was reflected with the scalp flap, to expose the left frontotemporal parietal region. A burr hole was placed, and a craniotomy flap was turned utilizing the high-speed, air powered drill. The flap was then placed in bacitracin-containing saline solution, and the dura was opened to expose a large left sided subdural hematoma with a large membrane with subdural blood underlying that. A hematoma was then removed with a combination of irrigation and suction.  We fenestrated the membrane with bipolar cautery.  I  continued to irrigate until the irrigant was clear to, and dried any bleeding with bipolar cautery. I then placed a subdural drain through separate stab incision and close the dura with a running 4-0 Nurolon suture. Dural tack up sutures were placed. The dura was lined with Gelfoam, and the craniotomy flap was replaced with doggie-bone plates. The wound was copiously irrigated. the galea was then closed with interrupted 2-0 Vicryl suture. The skin was then closed with staples a sterile dressing was applied. The patient was then taken out of the 3-point May[place] headrest and awakened from general anesthesia, and transported to the recovery room in stable condition.  Nurse practitioner was scrubbed for the entire case and assisted in the exposure, the craniotomy, and the removal of the subdural hematoma, and the closure. at the end of the procedure all sponge, needle, and instrument counts were correct.    PLAN OF CARE: Admit to inpatient   PATIENT DISPOSITION:  PACU - hemodynamically stable.   Delay start of Pharmacological VTE agent (>24hrs) due to surgical blood loss or risk of bleeding:  yes

## 2021-06-05 ENCOUNTER — Inpatient Hospital Stay (HOSPITAL_COMMUNITY): Payer: 59

## 2021-06-05 IMAGING — CT CT HEAD W/O CM
4 series · 17 of 47 positions shown, 19 images · non-contrast
Comparison: Head CT from yesterday

CLINICAL DATA: Follow-up intracranial hemorrhage.

EXAM:
CT HEAD WITHOUT CONTRAST
TECHNIQUE: Contiguous axial images were obtained from the base of the skull
through the vertex without intravenous contrast.

[Series 3: head wo · axial · 0.47mm/px · z∈[-48,+72]mm · 7 of 32 slices shown, 9 images]
[im 4/32  brain]
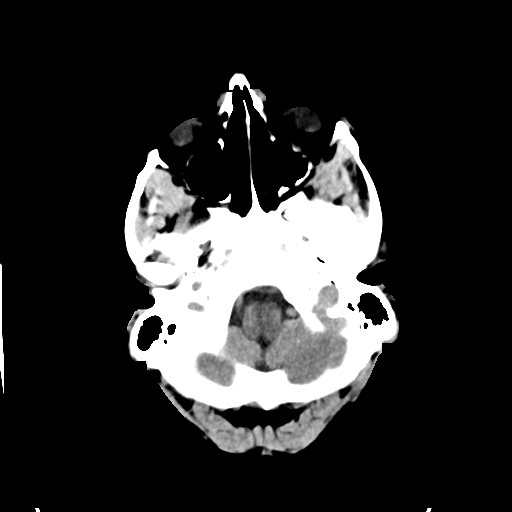
[im 4/32  bone]
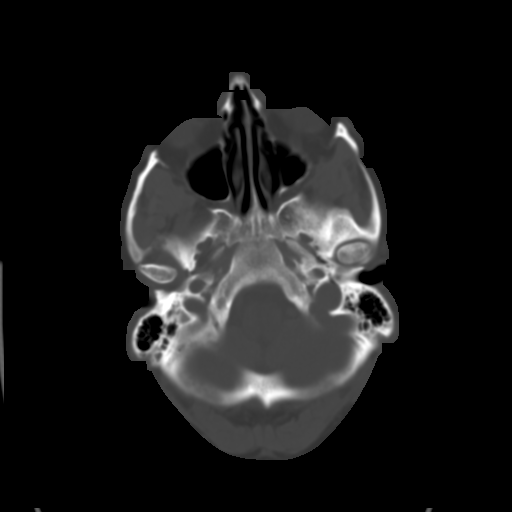
[im 8/32  brain]
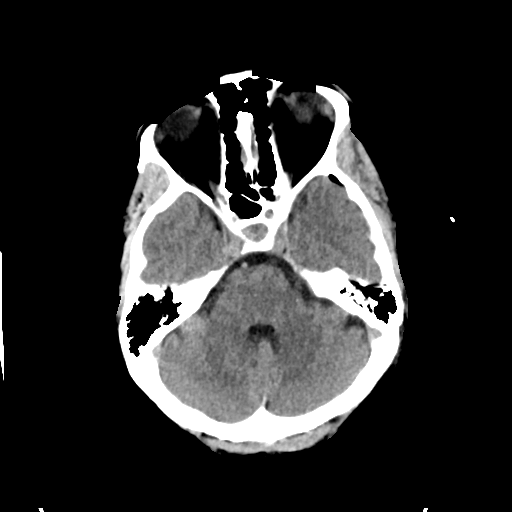
[im 12/32  brain]
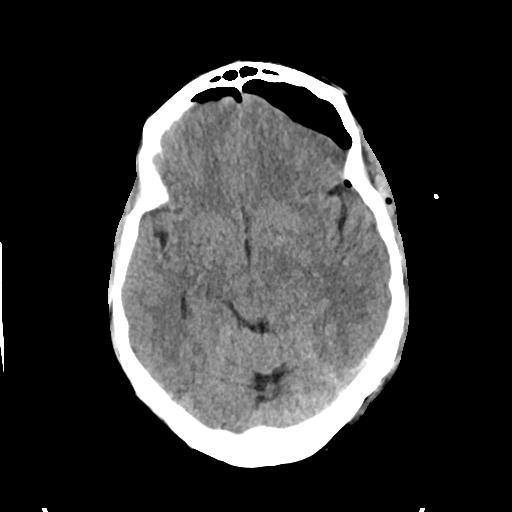
[im 16/32  brain]
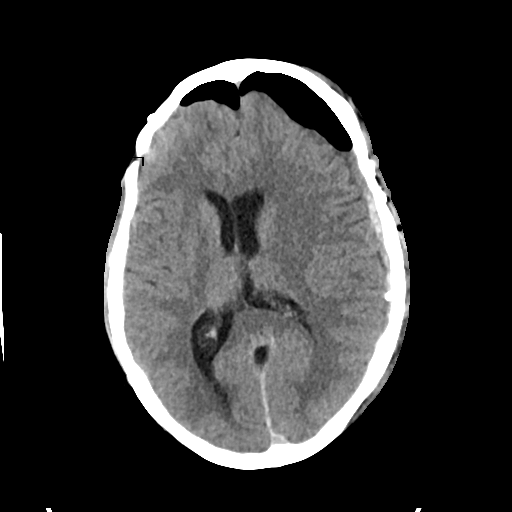
[im 20/32  brain]
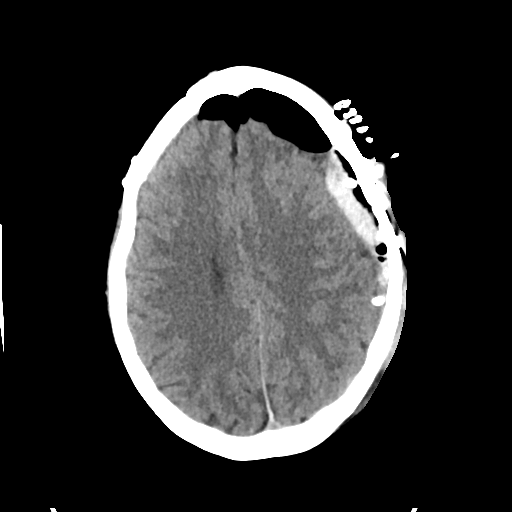
[im 20/32  bone]
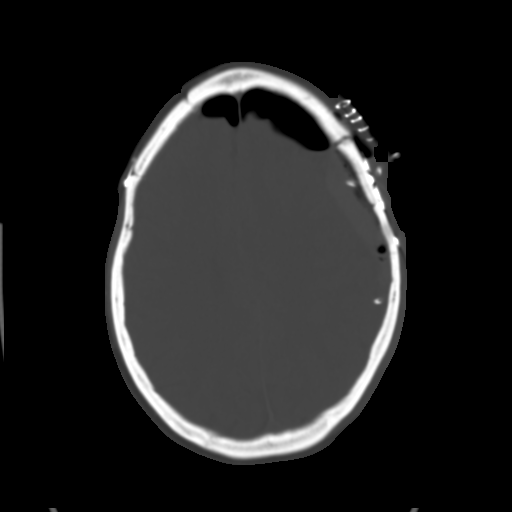
[im 24/32  brain]
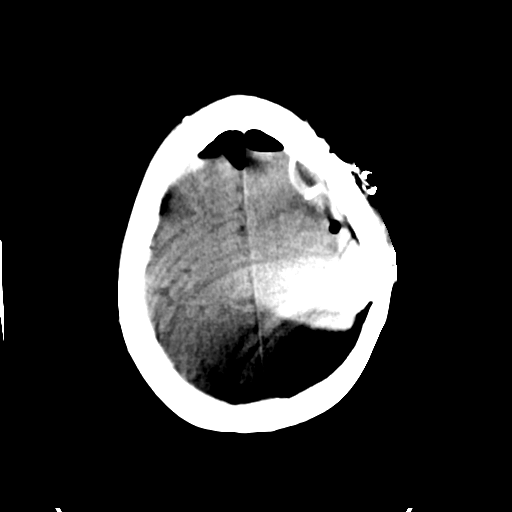
[im 28/32  brain]
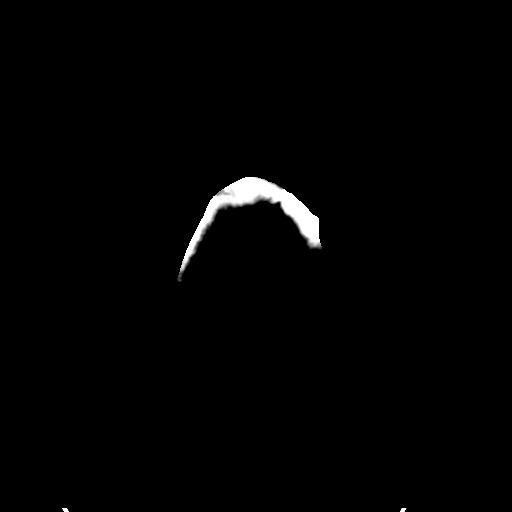

[Series 4: head bone · axial · 0.47mm/px · z∈[-50,+4]mm · 4 of 78 slices shown]
[im 8/78  bone]
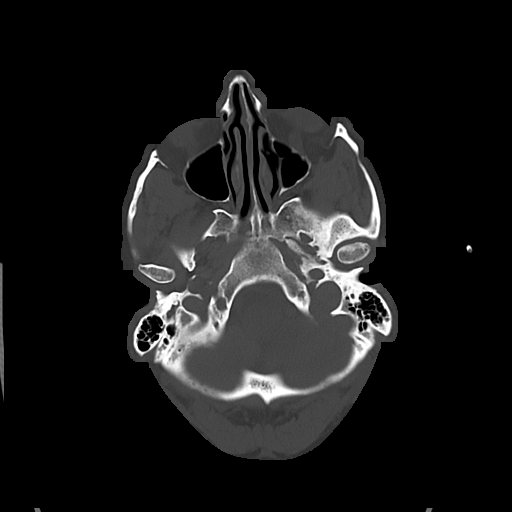
[im 16/78  bone]
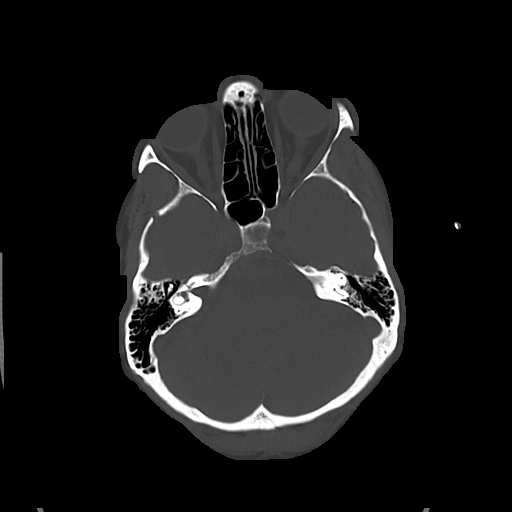
[im 24/78  bone]
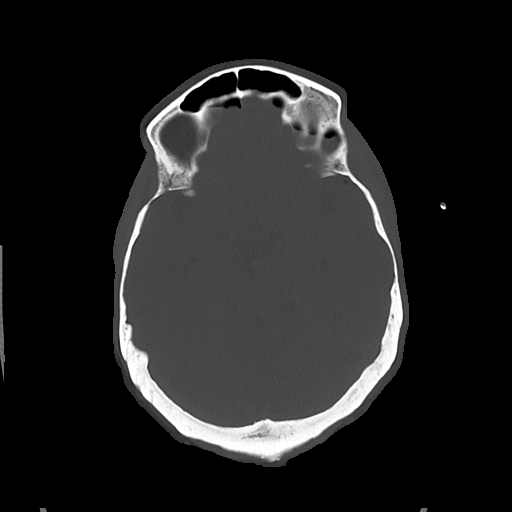
[im 35/78  bone]
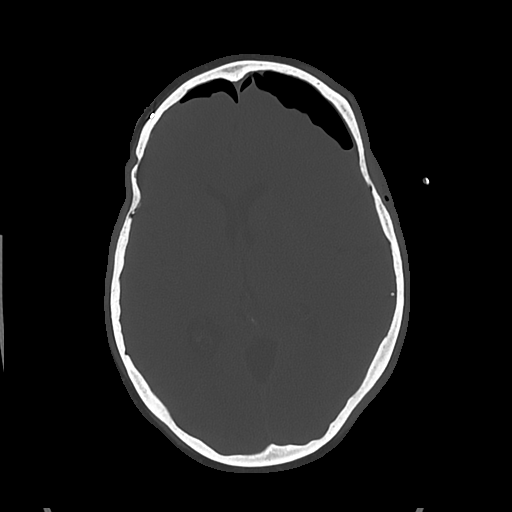

[Series 5: cor soft · coronal · 0.31mm/px · 3 of 68 slices shown]
[im 23/68  brain]
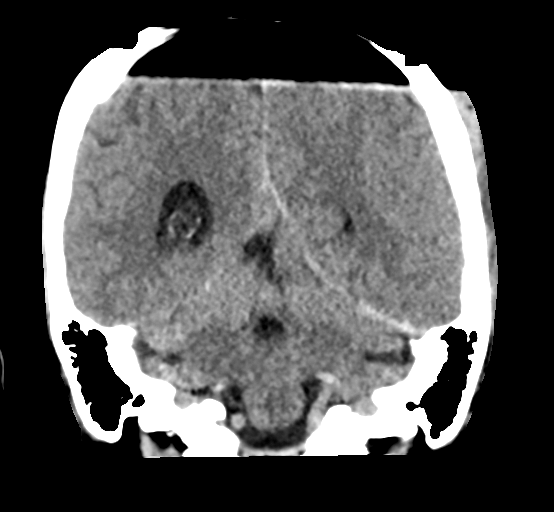
[im 30/68  brain]
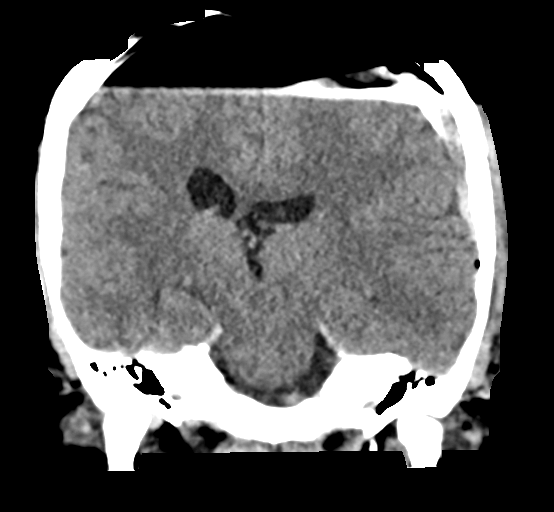
[im 38/68  brain]
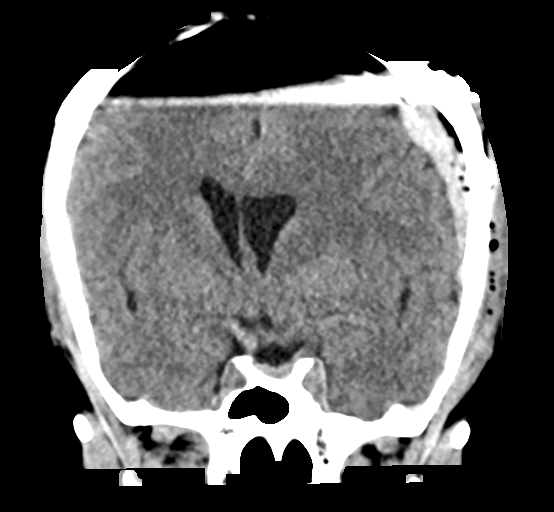

[Series 6: sag soft · sagittal · 0.33mm/px · 3 of 55 slices shown]
[im 19/55  brain]
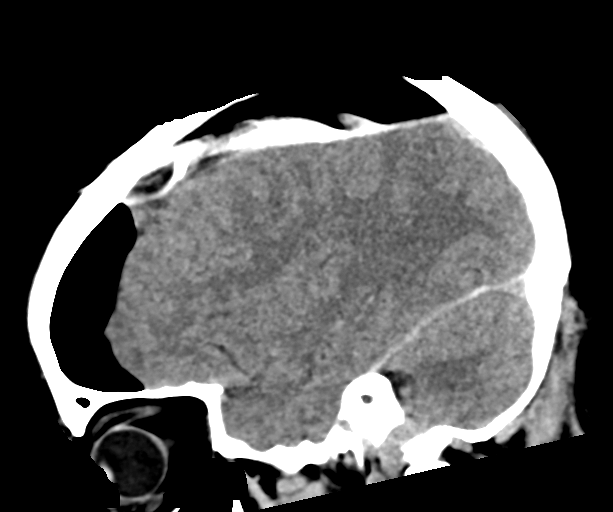
[im 28/55  brain]
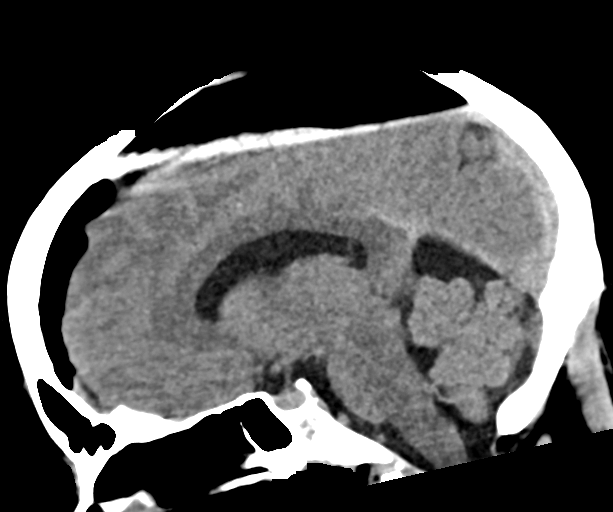
[im 37/55  brain]
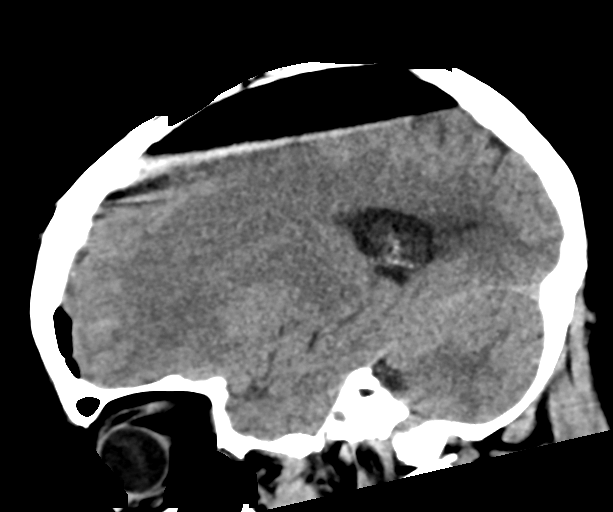

[17 of 47 positions shown; findings below may reference images not displayed]

FINDINGS: Brain: Interval drainage of subdural hematoma on the left with drain
in place. The collection is now mainly gas that is anteriorly
accumulated. Blood clot is present primarily deep to the bone flap
and measuring up to 1 cm in thickness. The gas collection measures
up to 17 mm thick with frontal lobe mass effect. Milder
pneumocephalus along the right anterior frontal lobe. No
hydrocephalus or acute infarct. Midline shift measures 7 mm.

Vascular: No hyperdense vessel or unexpected calcification.

Skull: Unremarkable left-sided craniotomy.

Sinuses/Orbits: Negative

Other: Motion degradation at the vertex.
IMPRESSION: Subdural hematoma decompression on the left with improved midline
shift now measuring 7 mm. The left-sided collection is now primarily
gas and blood clot deep to the bone flap.

## 2021-06-05 MED ORDER — CHLORHEXIDINE GLUCONATE CLOTH 2 % EX PADS: 6.0000 | MEDICATED_PAD | Freq: Every day | CUTANEOUS | Status: DC

## 2021-06-05 MED ORDER — LEVETIRACETAM 500 MG PO TABS: 500.0000 mg | ORAL_TABLET | Freq: Two times a day (BID) | ORAL | 0 refills | Status: DC

## 2021-06-05 MED ORDER — HYDROCODONE-ACETAMINOPHEN 5-325 MG PO TABS: 1.0000 | ORAL_TABLET | ORAL | 0 refills | Status: DC | PRN

## 2021-06-05 MED ORDER — LEVETIRACETAM 500 MG PO TABS: 500.0000 mg | ORAL_TABLET | Freq: Two times a day (BID) | ORAL | Status: DC

## 2021-06-05 NOTE — Progress Notes (Signed)
CT head looks good, mobilize today, PT/OT

## 2021-06-05 NOTE — Anesthesia Postprocedure Evaluation (Signed)
Anesthesia Post Note  Patient: Christina [name]  Procedure(s) Performed: CRANIOTOMY HEMATOMA EVACUATION SUBDURAL (Left)     Patient location during evaluation: PACU Anesthesia Type: General Level of consciousness: awake and alert Pain management: pain level controlled Vital Signs Assessment: post-procedure vital signs reviewed and stable Respiratory status: spontaneous breathing, nonlabored ventilation, respiratory function stable and patient connected to nasal cannula oxygen Cardiovascular status: blood pressure returned to baseline and stable Postop Assessment: no apparent nausea or vomiting Anesthetic complications: no   No notable events documented.  Last Vitals:  Vitals:   06/05/21 0600 06/05/21 0700  BP: 139/69 (!) 143/77  Pulse: 68 72  Resp: 13 15  Temp:    SpO2: 95% 96%    Last Pain:  Vitals:   06/05/21 0000  TempSrc: Oral  PainSc:                  [name]

## 2021-06-05 NOTE — Progress Notes (Signed)
She looks really good this morning.  She has no headache.  She slept well.  She is awake and alert and pleasant and conversant.  No pronator drift.  No aphasia.  Moves all extremities well.  Drain is patent putting out dark red blood.  Awaiting follow-up CT scan.  Vitals are stable.  Not tachycardic.

## 2021-06-05 NOTE — Evaluation (Signed)
Physical Therapy Evaluation Patient Details Name: Christina [name] MRN: [id number] DOB: 1961-02-05 Today's Date: 06/05/2021  History of Present Illness  60 y/o female presented to ED on 10/28 for severe headaches over last couple of months that got progressively worse in past 2 days. CT revealed significant increase in size of L SDH with significant mass effect on the brain parenchyma resulting in 1.1 cm rightward midline shift. S/p L crani for evacuation of SDH on 10/28. Tested positive for COVID. PMH: R crani in 2017 for SDH evacuation, HTN, grave's disease, hyperthyroidism, leukemia, OSA, sleep apnea.  Clinical Impression  PTA, patient lives with husband and reports independence and working as Nurse, children's. Patient presents with weakness, impaired balance, and pain. Patient functioning at supervision level for ambulation with no AD. Patient feels at baseline cognitively, husband agrees. Appears some slow processing functionally. Educated patient on importance of mobility with recovery, patient verbalized understanding. Patient will benefit from skilled PT services during acute stay to address listed deficits. No PT follow up recommended at this time.        Recommendations for follow up therapy are one component of a multi-disciplinary discharge planning process, led by the attending physician.  Recommendations may be updated based on patient status, additional functional criteria and insurance authorization.  Follow Up Recommendations No PT follow up    Assistance Recommended at Discharge Intermittent Supervision/Assistance  Functional Status Assessment Patient has had a recent decline in their functional status and demonstrates the ability to make significant improvements in function in a reasonable and predictable amount of time.  Equipment Recommendations  None recommended by PT    Recommendations for Other Services       Precautions / Restrictions Precautions Precautions: Fall Precaution  Comments: subdural drain Restrictions Weight Bearing Restrictions: No      Mobility  Bed Mobility Overal bed mobility: Modified Independent                  Transfers Overall transfer level: Modified independent Equipment used: None                    Ambulation/Gait Ambulation/Gait assistance: Supervision Gait Distance (Feet): 30 Feet Assistive device: None Gait Pattern/deviations: Step-through pattern;Decreased stride length Gait velocity: decreased   General Gait Details: slow hesitant gait pattern. No overt LOB. Supervision for safety  Stairs            Wheelchair Mobility    Modified Rankin (Stroke Patients Only)       Balance Overall balance assessment: Mild deficits observed, not formally tested                                           Pertinent Vitals/Pain Pain Assessment: Faces Faces Pain Scale: Hurts little more Pain Location: head  - drain placement Pain Descriptors / Indicators: Discomfort;Grimacing Pain Intervention(s): Monitored during session;Repositioned    Home Living Family/patient expects to be discharged to:: Private residence Living Arrangements: Spouse/significant other;Children Available Help at Discharge: Family;Available 24 hours/day Type of Home: House Home Access: Stairs to enter   [employer] of Steps: 2   Home Layout: One level Home Equipment: None      Prior Function Prior Level of Function : Independent/Modified Independent;Driving;Working/employed             Mobility Comments: works as Nurse, 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 [employer]  Extremity/Trunk Assessment   Upper Extremity Assessment Upper Extremity Assessment: Defer to OT evaluation    Lower Extremity Assessment Lower Extremity Assessment: Generalized weakness    Cervical / Trunk Assessment Cervical / Trunk Assessment: Normal  Communication   Communication: No difficulties  Cognition  Arousal/Alertness: Awake/alert Behavior During Therapy: WFL for tasks assessed/performed Overall Cognitive Status: Within Functional Limits for tasks assessed                                 General Comments: seems at baseline per husband        General Comments      Exercises     Assessment/Plan    PT Assessment Patient needs continued PT services  PT Problem List Decreased balance;Decreased activity tolerance       PT Treatment Interventions Gait training;Stair training;Functional mobility training;Therapeutic activities;Therapeutic exercise;Balance training    PT Goals (Current goals can be found in the Care Plan section)  Acute Rehab PT Goals Patient Stated Goal: to go home PT Goal Formulation: With patient/family Time For Goal Achievement: 06/19/21 Potential to Achieve Goals: Good    Frequency Min 3X/week   Barriers to discharge        Co-evaluation               AM-PAC PT "6 Clicks" Mobility  Outcome Measure Help needed turning from your back to your side while in a flat bed without using bedrails?: None Help needed moving from lying on your back to sitting on the side of a flat bed without using bedrails?: None Help needed moving to and from a bed to a chair (including a wheelchair)?: None Help needed standing up from a chair using your arms (e.g., wheelchair or bedside chair)?: None Help needed to walk in hospital room?: A Little Help needed climbing 3-5 steps with a railing? : A Little 6 Click Score: 22    End of Session   Activity Tolerance: Patient tolerated treatment well Patient left: in bed;with call bell/phone within reach;with family/visitor present Nurse Communication: Mobility status PT Visit Diagnosis: Unsteadiness on feet (R26.81)    Time: 1440-1500 PT Time Calculation (min) (ACUTE ONLY): 20 min   Charges:   PT Evaluation $PT Eval Moderate Complexity: 1 Mod          Julieanne Hadsall A. Gilford Rile PT, DPT Acute  Rehabilitation Services Pager [phone] Office [phone]   Linna Hoff 06/05/2021, 3:17 PM

## 2021-06-05 NOTE — Discharge Summary (Signed)
Physician Discharge Summary  Patient ID: Christina [name] MRN: [id number] DOB/AGE: June 26, 1961 60 y.o.  Admit date: 06/04/2021 Discharge date: 06/05/2021  Admission Diagnoses: left SDH   Discharge Diagnoses: same   Discharged Condition: good  Hospital Course: The patient was admitted on 06/04/2021 and taken to the operating room where the patient underwent craniotomy for evacuation of SDH. The patient tolerated the procedure well and was taken to the recovery room and then to the ICU in stable condition. The hospital course was routine. There were no complications. The wound remained clean dry and intact. Pt had appropriate head soreness. No complaints of new pain or new N/T/W. The patient remained afebrile with stable vital signs, and tolerated a regular diet. The patient continued to increase activities, and pain was well controlled with oral pain medications.   Consults: None  Significant Diagnostic Studies:  Results for orders placed or performed during the hospital encounter of 06/04/21  Resp Panel by RT-PCR (Flu A&B, Covid) Nasopharyngeal Swab   Specimen: Nasopharyngeal Swab; Nasopharyngeal(NP) swabs in vial transport medium  Result Value Ref Range   SARS Coronavirus 2 by RT PCR POSITIVE (A) NEGATIVE   Influenza A by PCR NEGATIVE NEGATIVE   Influenza B by PCR NEGATIVE NEGATIVE  MRSA Next Gen by PCR, Nasal   Specimen: Nasal Mucosa; Nasal Swab  Result Value Ref Range   MRSA by PCR Next Gen NOT DETECTED NOT DETECTED  Comprehensive metabolic panel  Result Value Ref Range   Sodium 128 (L) 135 - 145 mmol/L   Potassium 3.7 3.5 - 5.1 mmol/L   Chloride 98 98 - 111 mmol/L   CO2 22 22 - 32 mmol/L   Glucose, Bld 108 (H) 70 - 99 mg/dL   BUN 21 (H) 6 - 20 mg/dL   Creatinine, Ser 0.64 0.44 - 1.00 mg/dL   Calcium 9.1 8.9 - 10.3 mg/dL   Total Protein 6.9 6.5 - 8.1 g/dL   Albumin 4.1 3.5 - 5.0 g/dL   AST 18 15 - 41 U/L   ALT 19 0 - 44 U/L   Alkaline Phosphatase 44 38 - 126 U/L   Total  Bilirubin 0.5 0.3 - 1.2 mg/dL   GFR, Estimated >60 >60 mL/min   Anion gap 8 5 - 15  CBC with Differential  Result Value Ref Range   WBC 9.6 4.0 - 10.5 K/uL   RBC 4.24 3.87 - 5.11 MIL/uL   Hemoglobin 13.5 12.0 - 15.0 g/dL   HCT 39.2 36.0 - 46.0 %   MCV 92.5 80.0 - 100.0 fL   MCH 31.8 26.0 - 34.0 pg   MCHC 34.4 30.0 - 36.0 g/dL   RDW 13.5 11.5 - 15.5 %   Platelets 292 150 - 400 K/uL   nRBC 0.0 0.0 - 0.2 %   Neutrophils Relative % 83 %   Neutro Abs 8.1 (H) 1.7 - 7.7 K/uL   Lymphocytes Relative 10 %   Lymphs Abs 0.9 0.7 - 4.0 K/uL   Monocytes Relative 6 %   Monocytes Absolute 0.5 0.1 - 1.0 K/uL   Eosinophils Relative 0 %   Eosinophils Absolute 0.0 0.0 - 0.5 K/uL   Basophils Relative 1 %   Basophils Absolute 0.1 0.0 - 0.1 K/uL   Immature Granulocytes 0 %   Abs Immature Granulocytes 0.02 0.00 - 0.07 K/uL  Protime-INR  Result Value Ref Range   Prothrombin Time 11.9 11.4 - 15.2 seconds   INR 0.9 0.8 - 1.2  APTT  Result Value Ref Range  aPTT 31 24 - 36 seconds  Type and screen [place]  Result Value Ref Range   ABO/RH(D) O POS    Antibody Screen NEG    Sample Expiration      06/07/2021,2359 Performed at [facility], [place] [address], [place], [place] [address]   ABO/Rh  Result Value Ref Range   ABO/RH(D)      O POS Performed at [facility] [facility] [address] [place], [place] [address]     CT HEAD WO CONTRAST  Result Date: 06/05/2021 CLINICAL DATA:  Follow-up intracranial hemorrhage. EXAM: CT HEAD WITHOUT CONTRAST TECHNIQUE: Contiguous axial images were obtained from the base of the skull through the vertex without intravenous contrast. COMPARISON:  Head CT from yesterday FINDINGS: Brain: Interval drainage of subdural hematoma on the left with drain in place. The collection is now mainly gas that is anteriorly accumulated. Blood clot is present primarily deep to the bone flap and measuring up to 1 cm in thickness. The gas collection measures  up to 17 mm thick with frontal lobe mass effect. Milder pneumocephalus along the right anterior frontal lobe. No hydrocephalus or acute infarct. Midline shift measures 7 mm. Vascular: No hyperdense vessel or unexpected calcification. Skull: Unremarkable left-sided craniotomy. Sinuses/Orbits: Negative Other: Motion degradation at the vertex. IMPRESSION: Subdural hematoma decompression on the left with improved midline shift now measuring 7 mm. The left-sided collection is now primarily gas and blood clot deep to the bone flap. Electronically Signed   By: Jorje Guild M.D.   On: 06/05/2021 08:48   CT HEAD WO CONTRAST (5MM)  Result Date: 06/04/2021 CLINICAL DATA:  Headache for 3 days, injury to the head in May 2022, history of craniotomy EXAM: CT HEAD WITHOUT CONTRAST TECHNIQUE: Contiguous axial images were obtained from the base of the skull through the vertex without intravenous contrast. COMPARISON:  Brain MRI 05/02/2021 FINDINGS: Brain: There is a 1.8 cm maximally thick predominantly hypodense subdural collection overlying the left cerebral convexity. There are hyperdense blood products within the collection anteriorly suggesting acute on subacute/chronic hematoma. A collection was present on the brain MRI from 05/02/2021, but this collection is significantly increased in size. The collection exerts significant mass effect on the brain parenchyma with partial effacement of the left lateral ventricle and up to 1.1 cm rightward midline shift. There is a trace residual subdural collection on the right measuring up to approximately 3 mm in thickness, not significantly changed compared to the prior MRI. There is no evidence of acute infarct.  There is no solid lesion. Vascular: No hyperdense vessel or unexpected calcification. Skull: Postsurgical changes reflecting right frontal craniotomy are noted. There is no calvarial fracture. There is no suspicious osseous lesion. Postsurgical changes are also noted in the  bilateral maxillary sinuses. Sinuses/Orbits: The paranasal sinuses are clear. The globes and orbits are unremarkable. Other: None. IMPRESSION: Compared to the brain MRI from 05/02/2021. 1. 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 2. Trace right subdural collection, not significantly changed in size. These results were called by telephone at the time of interpretation on 06/04/2021 at 12:21 pm to provider ADAM JAFFE , who verbally acknowledged these results. Electronically Signed   By: Valetta Mole M.D.   On: 06/04/2021 12:22    Antibiotics:  Anti-infectives (From admission, onward)  Start     Dose/Rate Route Frequency Ordered Stop   06/05/21 0030  ceFAZolin (ANCEF) IVPB 1 g/50 mL premix        1 g 100 mL/hr over 30 Minutes Intravenous Every 8 hours 06/04/21 1942 06/05/21 0948   06/04/21 1515  ceFAZolin (ANCEF) IVPB 2g/100 mL premix        2 g 200 mL/hr over 30 Minutes Intravenous  Once 06/04/21 1507 06/04/21 1622   06/04/21 1509  ceFAZolin (ANCEF) 2-4 GM/100ML-% IVPB       Note to Pharmacy: Cameron Sprang   : cabinet override      06/04/21 1509 06/04/21 1645       Discharge Exam: Blood pressure (!) 143/79, pulse 81, temperature 98.2 F (36.8 C), resp. rate (!) 24, SpO2 95 %. Neurologic: Grossly normal Ambulating and voiding well incision cdi   Discharge Medications:   Allergies as of 06/05/2021   No Known Allergies      Medication List     STOP taking these medications    Sprycel 50 MG tablet Generic drug: dasatinib       TAKE these medications    acetaminophen 325 MG tablet Commonly known as: TYLENOL Take 650 mg by mouth every 6 (six) hours as needed for moderate pain or headache.   azithromycin 250 MG tablet Commonly known as: ZITHROMAX Take 2 tablets on  day 1, then 1 tablet daily on days 2 through 5   diazepam 5 MG tablet Commonly known as: Valium Take 1 tablet by mouth 30-40 minutes prior to MRI What changed:  how much to take how to take this when to take this   HYDROcodone-acetaminophen 5-325 MG tablet Commonly known as: NORCO/VICODIN Take 1 tablet by mouth every 4 (four) hours as needed for moderate pain.   levETIRAcetam 500 MG tablet Commonly known as: KEPPRA Take 1 tablet (500 mg total) by mouth every 12 (twelve) hours.   losartan 50 MG tablet Commonly known as: COZAAR Take 1 tablet (50 mg total) by mouth daily.   methylPREDNISolone 4 MG Tbpk tablet Commonly known as: MEDROL DOSEPAK Take as directed   metroNIDAZOLE 0.75 % cream Commonly known as: MetroCream Apply 1 application topically daily. What changed:  when to take this reasons to take this   nortriptyline 25 MG capsule Commonly known as: PAMELOR Take 1 capsule (25 mg total) by mouth at bedtime.   Synthroid 175 MCG tablet Generic drug: levothyroxine TAKE 1 TABLET (175 MCG TOTAL) BY MOUTH DAILY BEFORE BREAKFAST. What changed: when to take this   thiamine 100 MG tablet Commonly known as: Vitamin B-1 Take 100 mg by mouth daily.   verapamil 240 MG CR tablet Commonly known as: CALAN-SR TAKE 1 TABLET BY MOUTH AT BEDTIME. What changed: how much to take   zolpidem 10 MG tablet Commonly known as: AMBIEN TAKE 1 TABLET BY MOUTH AT BEDTIME AS NEEDED FOR SLEEP What changed:  how much to take when to take this        Disposition: home   Final Dx: left craniotomy for evacuation of SDH  Discharge Instructions     Call MD for:  difficulty breathing, headache or visual disturbances   Complete by: As directed    Call MD for:  hives   Complete by: As directed    Call MD for:  persistant dizziness or light-headedness   Complete by: As directed    Call MD for:  persistant nausea and vomiting   Complete by: As directed  Call MD for:  redness,  tenderness, or signs of infection (pain, swelling, redness, odor or green/yellow discharge around incision site)   Complete by: As directed    Call MD for:  severe uncontrolled pain   Complete by: As directed    Call MD for:  temperature >100.4   Complete by: As directed    Diet - low sodium heart healthy   Complete by: As directed    Increase activity slowly   Complete by: As directed    No wound care   Complete by: As directed           Signed: Kaleia Longhi Sakiyah Shur 06/05/2021, 7:03 PM

## 2021-06-05 NOTE — Progress Notes (Signed)
Pharmacist - Physician Communication  If any of the medication-specific conditions in the checklist below are identified, the medication will be held. Admitting MD may contact oncologist to review patient and resume medication if they feel it is appropriate.  Dasatinib (Sprycel) hold criteria Hgb < 8 ANC < 0.5 Pltc < 50K Hemorrhage - pt with SDH requiring craniotomy Severe fluid overload Pulmonary symptoms Active infection  Sherlon Handing, PharmD, BCPS Please see amion for complete clinical pharmacist phone list 06/05/2021 7:45 AM

## 2021-06-05 NOTE — Progress Notes (Signed)
Subdural drain no longer draining. Patient asymptomatic and neuro intact. Mobilizing to bathroom and chair. MD aware.

## 2021-06-07 ENCOUNTER — Encounter (HOSPITAL_COMMUNITY): Payer: Self-pay | Admitting: Neurological Surgery

## 2021-06-07 ENCOUNTER — Other Ambulatory Visit: Payer: Self-pay | Admitting: *Deleted

## 2021-06-07 NOTE — Patient Outreach (Addendum)
[facility] [facility]) Care Management  06/07/2021  Christina [name] 02/21/1961 [id number]   Transition of care telephone call  Referral received:06/07/21 Initial outreach:06/07/21 Insurance: [facility]   Initial unsuccessful telephone call to patient's preferred number in order to complete transition of care assessment; no answer, left HIPAA compliant voicemail message requesting return call.   Objective: Per the electronic medical record, Christina [name]   was hospitalized at  [facility] 10/28-10/ 29/22 for severe headache, s/p left crainotomy for evacuation of SDH . Comorbidities include: HTN , chronic myeloid leukemia, history of subdural hematoma. She  was discharged to home on 06/05/21 without the need for home health services or durable medical equipment per the discharge summary.   Plan: This RNCM will route unsuccessful outreach letter with [place] Management pamphlet and 24 hour Nurse Advice Line Magnet to [place] Management clinical pool to be mailed to patient's home address. This RNCM will attempt another outreach within 4 business days.   Joylene Draft, RN, BSN  [place] Management Coordinator  [phone]- Mobile [phone]- Toll Free Main Office

## 2021-06-08 LAB — POCT I-STAT, CHEM 8
BUN: 20 mg/dL (ref 6–20)
Calcium, Ion: 1.12 mmol/L — ABNORMAL LOW (ref 1.15–1.40)
Chloride: 103 mmol/L (ref 98–111)
Creatinine, Ser: 0.7 mg/dL (ref 0.44–1.00)
Glucose, Bld: 100 mg/dL — ABNORMAL HIGH (ref 70–99)
HCT: 40 % (ref 36.0–46.0)
Hemoglobin: 13.6 g/dL (ref 12.0–15.0)
Potassium: 3.9 mmol/L (ref 3.5–5.1)
Sodium: 135 mmol/L (ref 135–145)
TCO2: 23 mmol/L (ref 22–32)

## 2021-06-10 ENCOUNTER — Other Ambulatory Visit: Payer: Self-pay | Admitting: *Deleted

## 2021-06-10 NOTE — Patient Outreach (Signed)
[facility]) Care Management  06/10/2021  Christina [name] [birth year] [id number]   Transition of care call Referral received: 06/07/21 Initial outreach attempt: 06/07/21 Insurance: [place]    2nd unsuccessful telephone call to patient's preferred contact number in order to complete post hospital discharge transition of care assessment , no answer left HIPAA compliant message requesting return call.    Objective: Per the electronic medical record, [name]   was hospitalized at  [facility] 10/28-10/ 29/22 for severe headache, s/p left crainotomy for evacuation of SDH . Comorbidities include: HTN , chronic myeloid leukemia, history of subdural hematoma. She  was discharged to home on 06/05/21 without the need for home health services or durable medical equipment per the discharge summary.    Plan If no return call from patient will attempt 3rd outreach in the next 4 business days.    Joylene Draft, RN, BSN  [place] Management Coordinator  [phone]- Mobile [phone]- Toll Free Main Office

## 2021-06-15 ENCOUNTER — Other Ambulatory Visit: Payer: Self-pay | Admitting: *Deleted

## 2021-06-15 NOTE — Patient Outreach (Signed)
[place] [facility]) Care Management  06/15/2021  Christina [name] 10/13/1960 [id number]   Transition of care call Referral received: 06/07/21 Initial outreach attempt: 06/07/21 Insurance: [place] unsuccessful telephone call to patient's preferred contact number in order to complete post hospital discharge transition of care assessment; no answer, left HIPAA compliant message requesting return call.   Objective: Per the electronic medical record, [name]   was hospitalized at  [facility] 10/28-10/ 29/22 for severe headache, s/p left crainotomy for evacuation of SDH . Comorbidities include: HTN , chronic myeloid leukemia, history of subdural hematoma. She  was discharged to home on 06/05/21 without the need for home health services or durable medical equipment per the discharge summary Plan: If no return call from patient, will plan return call within the next  3 weeks.    Joylene Draft, RN, BSN  [place] Management Coordinator  [phone]- Mobile [phone]- Toll Free Main Office

## 2021-06-16 ENCOUNTER — Other Ambulatory Visit: Payer: Self-pay | Admitting: Neurological Surgery

## 2021-06-16 DIAGNOSIS — I6200 Nontraumatic subdural hemorrhage, unspecified: Secondary | ICD-10-CM

## 2021-06-22 ENCOUNTER — Other Ambulatory Visit: Payer: Self-pay | Admitting: *Deleted

## 2021-06-22 ENCOUNTER — Ambulatory Visit: Payer: Self-pay | Admitting: *Deleted

## 2021-06-22 NOTE — Patient Outreach (Signed)
[place] [facility]) Care Management  06/22/2021  Christina [name] 12-03-1960 [id number]   Transition of care /Case Closure Unsuccessful outreach    Referral received:06/07/21 Initial outreach:06/07/21 Insurance: [place]   #4 outreach attempt  Unable to complete post hospital discharge transition of care assessment. No return call form patient after 3 call attempts and no response to request to contact RN Care Coordinator in unsuccessful outreach letter mailed to home on 06/07/21. Unsuccessful outreach call on today, mailbox is full unable to leave a message.   Objective: Per the electronic medical record, Christina [name]   was hospitalized at  [facility] 10/28-10/ 29/22 for severe headache, s/p left crainotomy for evacuation of SDH . Comorbidities include: HTN , chronic myeloid leukemia, history of subdural hematoma. She  was discharged to home on 06/05/21 without the need for home health services or durable medical equipment per the discharge summary Plan Case closed to [place] care management services unsuccessful outreach attempt x 4.  Joylene Draft, RN, BSN  [place] Management Coordinator  [phone]- Mobile [phone]- Toll Free Main Office

## 2021-06-24 ENCOUNTER — Other Ambulatory Visit (HOSPITAL_COMMUNITY): Payer: Self-pay

## 2021-06-25 ENCOUNTER — Other Ambulatory Visit: Payer: Self-pay | Admitting: Neurological Surgery

## 2021-06-25 ENCOUNTER — Other Ambulatory Visit: Payer: Self-pay

## 2021-06-25 ENCOUNTER — Ambulatory Visit
Admission: RE | Admit: 2021-06-25 | Discharge: 2021-06-25 | Disposition: A | Discharge status: Home / Self Care | Payer: 59 | Source: Ambulatory Visit | Attending: Neurological Surgery | Admitting: Neurological Surgery

## 2021-06-25 ENCOUNTER — Encounter (HOSPITAL_COMMUNITY): Payer: Self-pay | Admitting: Neurological Surgery

## 2021-06-25 DIAGNOSIS — I6200 Nontraumatic subdural hemorrhage, unspecified: Secondary | ICD-10-CM

## 2021-06-25 DIAGNOSIS — I6203 Nontraumatic chronic subdural hemorrhage: Secondary | ICD-10-CM | POA: Diagnosis not present

## 2021-06-25 IMAGING — CT CT HEAD W/O CM
1 series · 15 of 30 positions shown, 19 images · non-contrast
Comparison: CT head [DATE]
COMPARISON: CT head [DATE]

Addendum:
CLINICAL DATA: History of subdural hematoma with craniotomy.

EXAM:
CT HEAD WITHOUT CONTRAST
TECHNIQUE: Contiguous axial images were obtained from the base of the skull
through the vertex without intravenous contrast.

[Series 2: head w/(date) · axial · 0.49mm/px · z∈[+1065,+1235]mm · 15 of 38 slices shown, 19 images]
[im 2/38  brain]
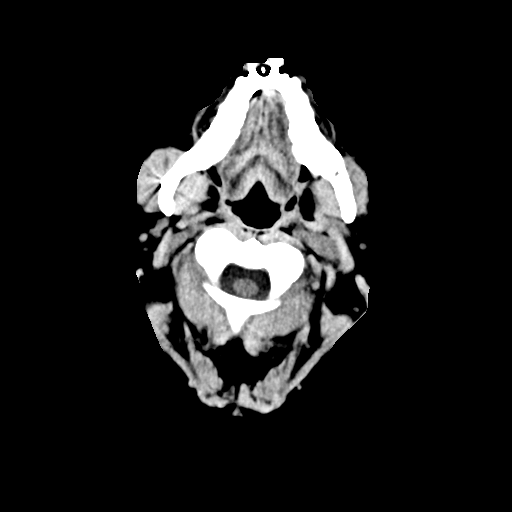
[im 2/38  bone]
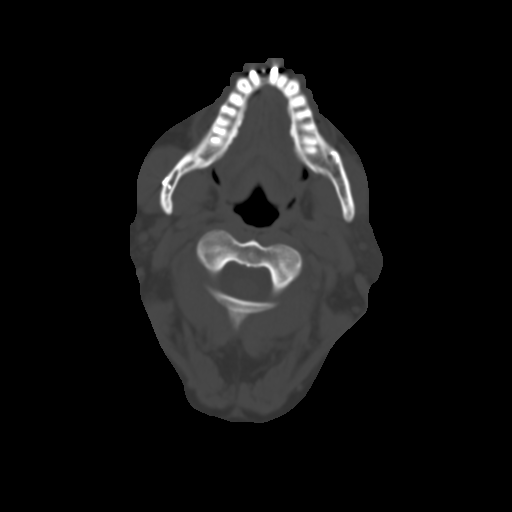
[im 4/38  brain]
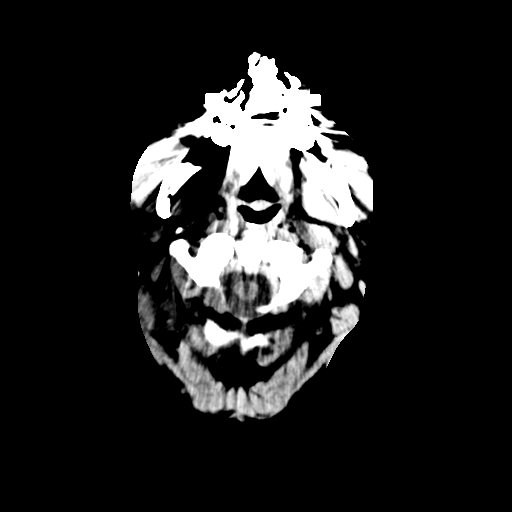
[im 7/38  brain]
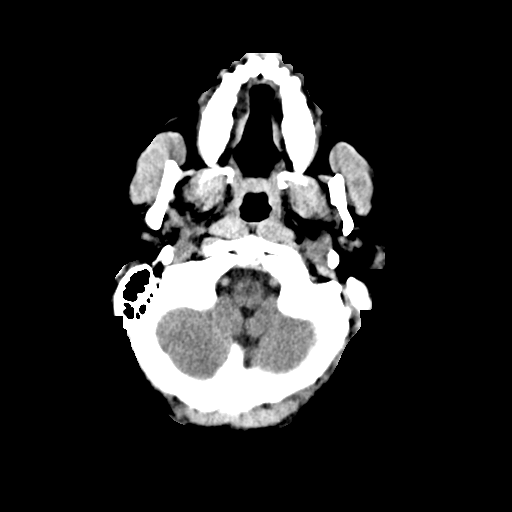
[im 9/38  brain]
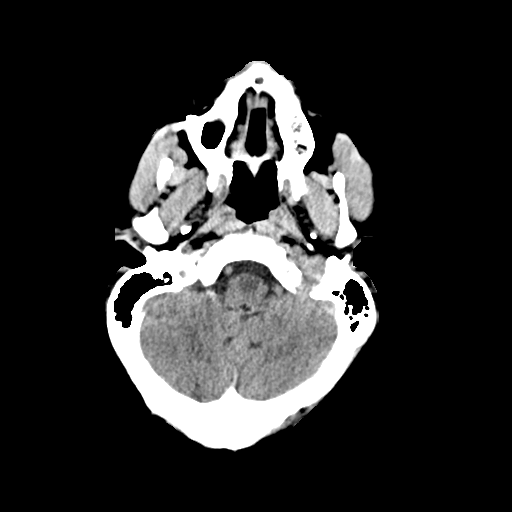
[im 12/38  brain]
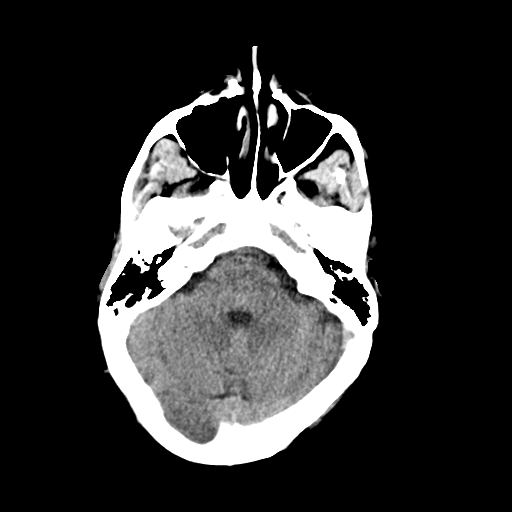
[im 12/38  bone]
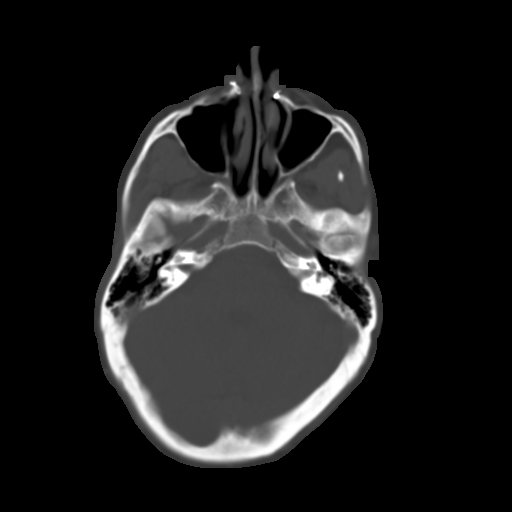
[im 15/38  brain]
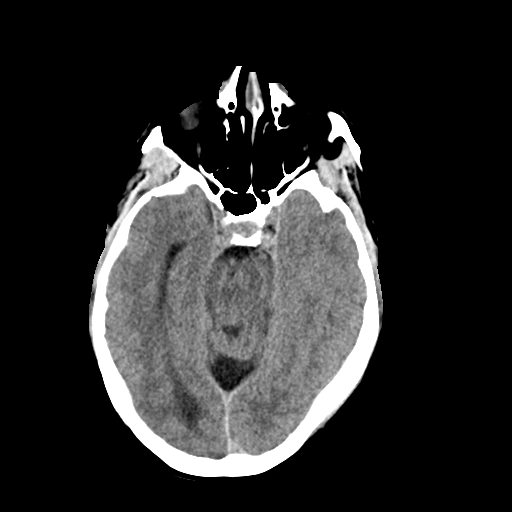
[im 17/38  brain]
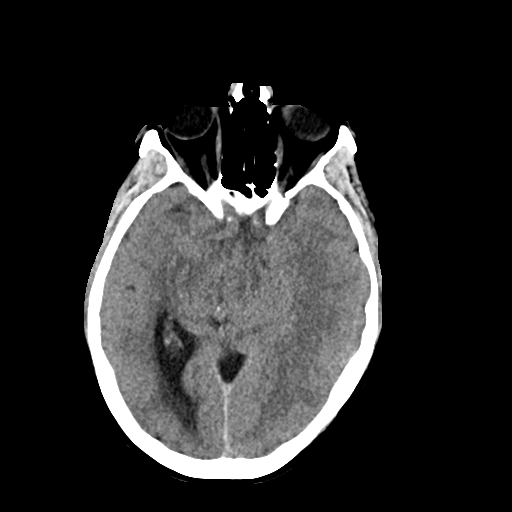
[im 20/38  brain]
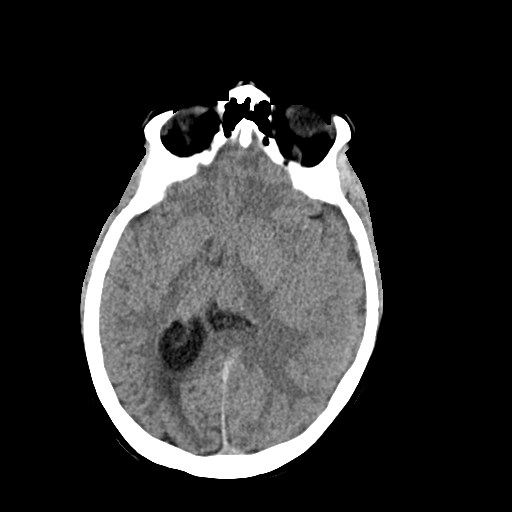
[im 21/38  brain]
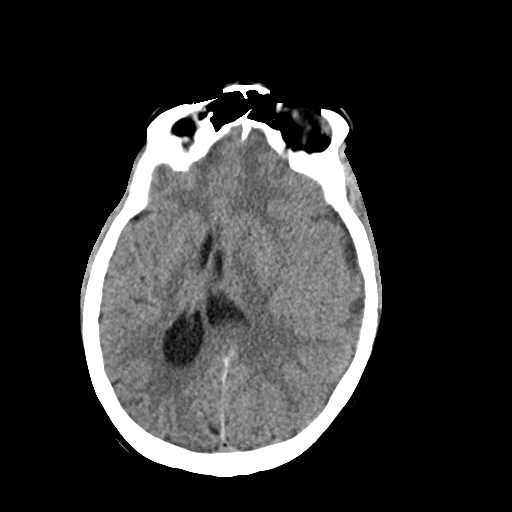
[im 21/38  bone]
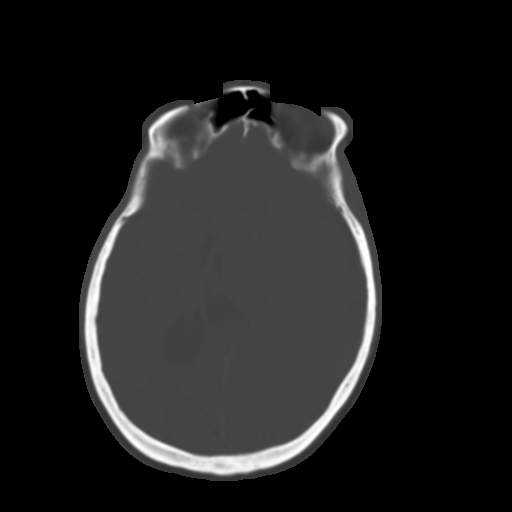
[im 23/38  brain]
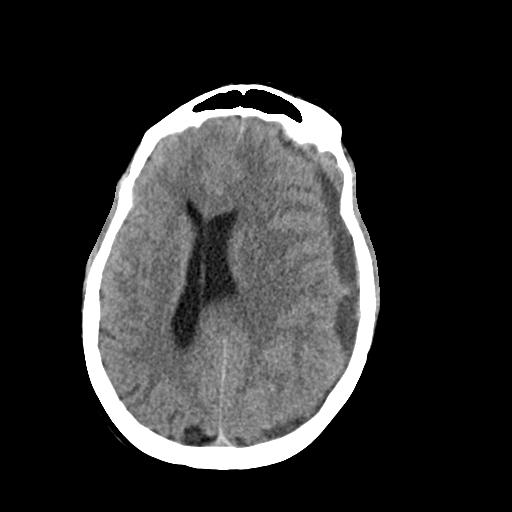
[im 26/38  brain]
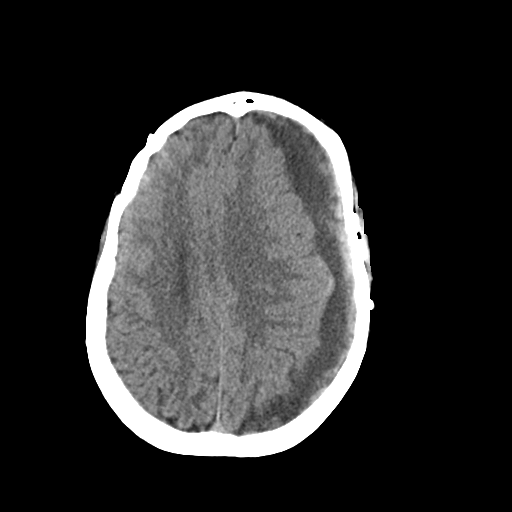
[im 29/38  brain]
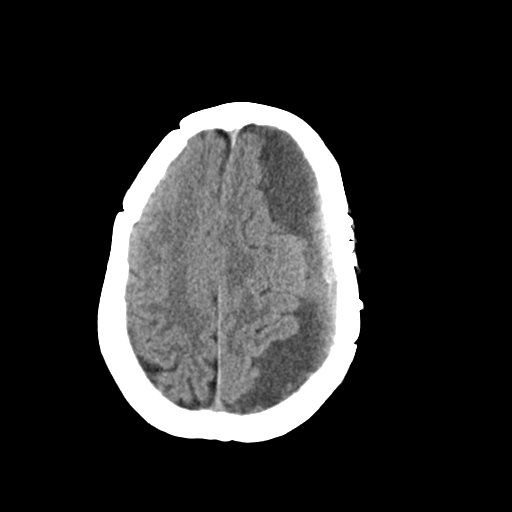
[im 31/38  brain]
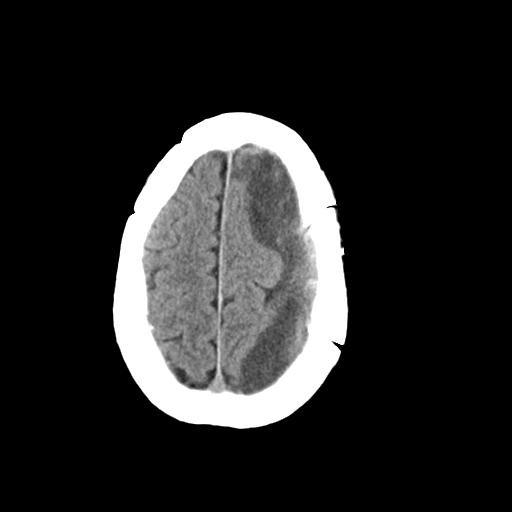
[im 31/38  bone]
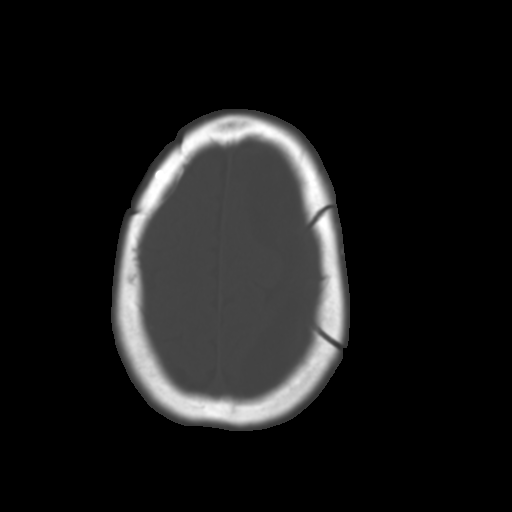
[im 34/38  brain]
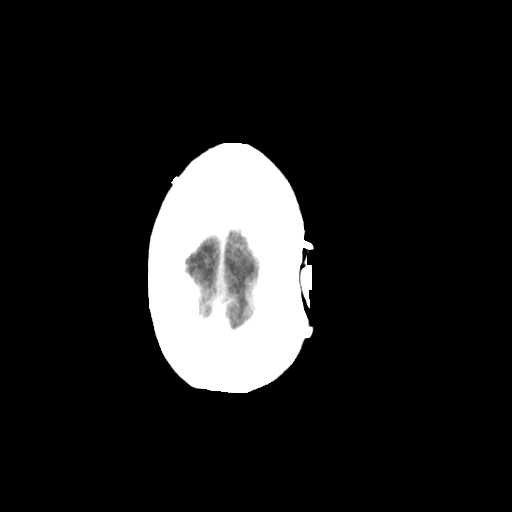
[im 36/38  brain]
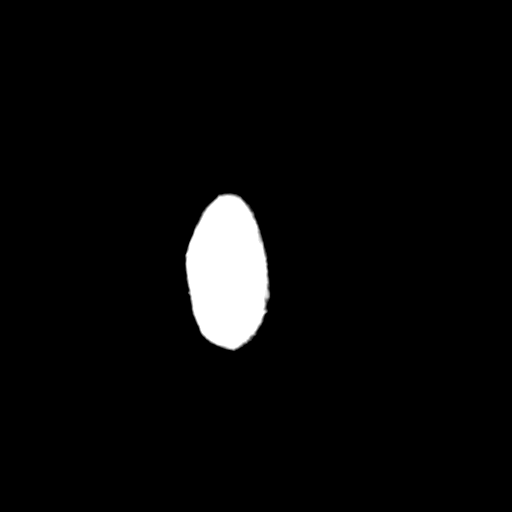

[15 of 30 positions shown; findings below may reference images not displayed]

FINDINGS: Brain: Large left subdural hematoma has increased in size
significantly. The hematoma is predominately low density but there
are scattered areas of increased density compatible with recent
hemorrhage. Subdural gas has resolved since the prior study.
Subdural fluid collection on the left now measures 16 mm. There is
increased mass-effect with 16 mm midline shift. Mild trapping of the
right ventricle. No subdural hematoma on the right.

Negative for acute infarct.

Vascular: Negative for hyperdense vessel

Skull: Bilateral craniotomy

Sinuses/Orbits: Negative

Other: None
IMPRESSION: Large recurrent left subdural hematoma. This now measures 16 mm in
thickness. Increased midline shift which is now 16 mm. There is
enlargement trapping of the right lateral ventricle.

Call report currently in progress.

ADDENDUM:
These results were called by telephone at the time of interpretation
on [DATE] at [DATE] to provider PELEMO , who verbally
acknowledged these results.

*** End of Addendum ***
FINDINGS: Brain: Large left subdural hematoma has increased in size
significantly. The hematoma is predominately low density but there
are scattered areas of increased density compatible with recent
hemorrhage. Subdural gas has resolved since the prior study.
Subdural fluid collection on the left now measures 16 mm. There is
increased mass-effect with 16 mm midline shift. Mild trapping of the
right ventricle. No subdural hematoma on the right.

Negative for acute infarct.

Vascular: Negative for hyperdense vessel

Skull: Bilateral craniotomy

Sinuses/Orbits: Negative

Other: None
IMPRESSION: Large recurrent left subdural hematoma. This now measures 16 mm in
thickness. Increased midline shift which is now 16 mm. There is
enlargement trapping of the right lateral ventricle.

Call report currently in progress.

## 2021-06-25 NOTE — Progress Notes (Signed)
Spoke with pt's husband, Zamya Culhane for pre-op call. DPR on file. He states pt does not have a cardiac history and is not diabetic. She is treated for HTN. Pt is in full remission of Leukemia.  Pt tested positive for Covid on 06/04/21. Will not need a Covid test.

## 2021-06-27 ENCOUNTER — Inpatient Hospital Stay (HOSPITAL_COMMUNITY): Payer: 59 | Admitting: Registered Nurse

## 2021-06-27 ENCOUNTER — Encounter (HOSPITAL_COMMUNITY)
Admission: RE | Disposition: A | Discharge status: Home / Self Care | Payer: Self-pay | Source: Home / Self Care | Attending: Neurological Surgery

## 2021-06-27 ENCOUNTER — Encounter (HOSPITAL_COMMUNITY): Payer: Self-pay | Admitting: Neurological Surgery

## 2021-06-27 ENCOUNTER — Inpatient Hospital Stay (HOSPITAL_COMMUNITY)
Admission: RE | Admit: 2021-06-27 | Discharge: 2021-06-29 | DRG: 025 | Disposition: A | Discharge status: Home / Self Care | Payer: 59 | Attending: Neurological Surgery | Admitting: Neurological Surgery

## 2021-06-27 ENCOUNTER — Other Ambulatory Visit: Payer: Self-pay

## 2021-06-27 DIAGNOSIS — G4733 Obstructive sleep apnea (adult) (pediatric): Secondary | ICD-10-CM | POA: Diagnosis present

## 2021-06-27 DIAGNOSIS — Z79890 Hormone replacement therapy (postmenopausal): Secondary | ICD-10-CM

## 2021-06-27 DIAGNOSIS — Z803 Family history of malignant neoplasm of breast: Secondary | ICD-10-CM

## 2021-06-27 DIAGNOSIS — G935 Compression of brain: Secondary | ICD-10-CM | POA: Diagnosis not present

## 2021-06-27 DIAGNOSIS — I6203 Nontraumatic chronic subdural hemorrhage: Principal | ICD-10-CM | POA: Diagnosis present

## 2021-06-27 DIAGNOSIS — E039 Hypothyroidism, unspecified: Secondary | ICD-10-CM | POA: Diagnosis not present

## 2021-06-27 DIAGNOSIS — Z856 Personal history of leukemia: Secondary | ICD-10-CM

## 2021-06-27 DIAGNOSIS — Z806 Family history of leukemia: Secondary | ICD-10-CM | POA: Diagnosis not present

## 2021-06-27 DIAGNOSIS — Z833 Family history of diabetes mellitus: Secondary | ICD-10-CM

## 2021-06-27 DIAGNOSIS — Z8249 Family history of ischemic heart disease and other diseases of the circulatory system: Secondary | ICD-10-CM

## 2021-06-27 DIAGNOSIS — Z8616 Personal history of COVID-19: Secondary | ICD-10-CM | POA: Diagnosis not present

## 2021-06-27 DIAGNOSIS — Z79899 Other long term (current) drug therapy: Secondary | ICD-10-CM

## 2021-06-27 DIAGNOSIS — Z8041 Family history of malignant neoplasm of ovary: Secondary | ICD-10-CM

## 2021-06-27 DIAGNOSIS — Z9889 Other specified postprocedural states: Secondary | ICD-10-CM

## 2021-06-27 DIAGNOSIS — I6200 Nontraumatic subdural hemorrhage, unspecified: Secondary | ICD-10-CM | POA: Diagnosis not present

## 2021-06-27 DIAGNOSIS — R4701 Aphasia: Secondary | ICD-10-CM | POA: Diagnosis present

## 2021-06-27 DIAGNOSIS — I10 Essential (primary) hypertension: Secondary | ICD-10-CM | POA: Diagnosis present

## 2021-06-27 DIAGNOSIS — S065XAA Traumatic subdural hemorrhage with loss of consciousness status unknown, initial encounter: Secondary | ICD-10-CM | POA: Diagnosis present

## 2021-06-27 DIAGNOSIS — E559 Vitamin D deficiency, unspecified: Secondary | ICD-10-CM | POA: Diagnosis not present

## 2021-06-27 DIAGNOSIS — G9389 Other specified disorders of brain: Secondary | ICD-10-CM | POA: Diagnosis not present

## 2021-06-27 HISTORY — PX: CRANIOTOMY: SHX93

## 2021-06-27 HISTORY — DX: Headache, unspecified: R51.9

## 2021-06-27 LAB — BASIC METABOLIC PANEL WITH GFR
Anion gap: 10 (ref 5–15)
BUN: 15 mg/dL (ref 6–20)
CO2: 22 mmol/L (ref 22–32)
Calcium: 9.6 mg/dL (ref 8.9–10.3)
Chloride: 104 mmol/L (ref 98–111)
Creatinine, Ser: 0.77 mg/dL (ref 0.44–1.00)
GFR, Estimated: >60 mL/min
Glucose, Bld: 98 mg/dL (ref 70–99)
Potassium: 3.8 mmol/L (ref 3.5–5.1)
Sodium: 136 mmol/L (ref 135–145)

## 2021-06-27 LAB — CBC
HCT: 38.6 % (ref 36.0–46.0)
Hemoglobin: 13 g/dL (ref 12.0–15.0)
MCH: 31.2 pg (ref 26.0–34.0)
MCHC: 33.7 g/dL (ref 30.0–36.0)
MCV: 92.6 fL (ref 80.0–100.0)
Platelets: 351 10*3/uL (ref 150–400)
RBC: 4.17 MIL/uL (ref 3.87–5.11)
RDW: 13 % (ref 11.5–15.5)
WBC: 4.7 10*3/uL (ref 4.0–10.5)
nRBC: 0 % (ref 0.0–0.2)

## 2021-06-27 LAB — TYPE AND SCREEN
ABO/RH(D): O POS
Antibody Screen: NEGATIVE

## 2021-06-27 LAB — MRSA NEXT GEN BY PCR, NASAL: MRSA by PCR Next Gen: NOT DETECTED

## 2021-06-27 SURGERY — CRANIOTOMY HEMATOMA EVACUATION SUBDURAL
Anesthesia: General | Site: Head | Laterality: Left

## 2021-06-27 MED ORDER — NORTRIPTYLINE HCL 25 MG PO CAPS: 25.0000 mg | ORAL_CAPSULE | Freq: Every day | ORAL | Status: DC

## 2021-06-27 MED ORDER — DEXAMETHASONE SODIUM PHOSPHATE 10 MG/ML IJ SOLN
INTRAMUSCULAR | Status: AC
  Filled 2021-06-27: qty 1

## 2021-06-27 MED ORDER — BACITRACIN ZINC 500 UNIT/GM EX OINT
TOPICAL_OINTMENT | CUTANEOUS | Status: AC
  Filled 2021-06-27: qty 28.35

## 2021-06-27 MED ORDER — LIDOCAINE 2% (20 MG/ML) 5 ML SYRINGE
INTRAMUSCULAR | Status: AC
  Filled 2021-06-27: qty 5

## 2021-06-27 MED ORDER — FENTANYL CITRATE (PF) 250 MCG/5ML IJ SOLN
INTRAMUSCULAR | Status: AC
  Filled 2021-06-27: qty 5

## 2021-06-27 MED ORDER — SENNA 8.6 MG PO TABS
1.0000 | ORAL_TABLET | Freq: Two times a day (BID) | ORAL | Status: DC
  Administered 2021-06-27 – 2021-06-29 (×5): 8.6 mg via ORAL
  Filled 2021-06-27 (×5): qty 1

## 2021-06-27 MED ORDER — HYDROCODONE-ACETAMINOPHEN 5-325 MG PO TABS
1.0000 | ORAL_TABLET | ORAL | Status: DC | PRN
  Filled 2021-06-27 (×2): qty 1

## 2021-06-27 MED ORDER — THROMBIN 5000 UNITS EX SOLR
CUTANEOUS | Status: AC
  Filled 2021-06-27: qty 5000

## 2021-06-27 MED ORDER — OXYCODONE HCL 5 MG PO TABS: 5.0000 mg | ORAL_TABLET | Freq: Once | ORAL | Status: DC | PRN

## 2021-06-27 MED ORDER — PHENYLEPHRINE HCL-NACL 20-0.9 MG/250ML-% IV SOLN
INTRAVENOUS | Status: DC | PRN
  Administered 2021-06-27: 25 ug/min via INTRAVENOUS

## 2021-06-27 MED ORDER — ACETAMINOPHEN 10 MG/ML IV SOLN
1000.0000 mg | Freq: Once | INTRAVENOUS | Status: DC | PRN
  Administered 2021-06-27: 1000 mg via INTRAVENOUS

## 2021-06-27 MED ORDER — PANTOPRAZOLE SODIUM 40 MG IV SOLR
40.0000 mg | Freq: Every day | INTRAVENOUS | Status: DC
  Administered 2021-06-27: 40 mg via INTRAVENOUS
  Filled 2021-06-27: qty 40

## 2021-06-27 MED ORDER — LEVETIRACETAM 500 MG PO TABS
500.0000 mg | ORAL_TABLET | Freq: Two times a day (BID) | ORAL | Status: DC
  Administered 2021-06-27 – 2021-06-29 (×4): 500 mg via ORAL
  Filled 2021-06-27 (×4): qty 1

## 2021-06-27 MED ORDER — FENTANYL CITRATE (PF) 250 MCG/5ML IJ SOLN
INTRAMUSCULAR | Status: DC | PRN
  Administered 2021-06-27: 100 ug via INTRAVENOUS
  Administered 2021-06-27: 50 ug via INTRAVENOUS

## 2021-06-27 MED ORDER — ONDANSETRON HCL 4 MG/2ML IJ SOLN
INTRAMUSCULAR | Status: AC
  Filled 2021-06-27: qty 2

## 2021-06-27 MED ORDER — THROMBIN 20000 UNITS EX SOLR
CUTANEOUS | Status: DC | PRN
  Administered 2021-06-27: 20 mL via TOPICAL

## 2021-06-27 MED ORDER — PROPOFOL 10 MG/ML IV BOLUS
INTRAVENOUS | Status: DC | PRN
  Administered 2021-06-27: 100 mg via INTRAVENOUS

## 2021-06-27 MED ORDER — PROPOFOL 10 MG/ML IV BOLUS
INTRAVENOUS | Status: AC
  Filled 2021-06-27: qty 20

## 2021-06-27 MED ORDER — DEXAMETHASONE SODIUM PHOSPHATE 10 MG/ML IJ SOLN
10.0000 mg | Freq: Once | INTRAMUSCULAR | Status: AC
  Administered 2021-06-27: 10 mg via INTRAVENOUS

## 2021-06-27 MED ORDER — MORPHINE SULFATE (PF) 2 MG/ML IV SOLN
1.0000 mg | INTRAVENOUS | Status: DC | PRN
  Administered 2021-06-27 (×3): 2 mg via INTRAVENOUS
  Filled 2021-06-27 (×4): qty 1

## 2021-06-27 MED ORDER — DEXAMETHASONE SODIUM PHOSPHATE 4 MG/ML IJ SOLN: 4.0000 mg | Freq: Three times a day (TID) | INTRAMUSCULAR | Status: DC

## 2021-06-27 MED ORDER — MIDAZOLAM HCL 2 MG/2ML IJ SOLN
INTRAMUSCULAR | Status: AC
  Filled 2021-06-27: qty 2

## 2021-06-27 MED ORDER — CHLORHEXIDINE GLUCONATE CLOTH 2 % EX PADS: 6.0000 | MEDICATED_PAD | Freq: Once | CUTANEOUS | Status: DC

## 2021-06-27 MED ORDER — LOSARTAN POTASSIUM 50 MG PO TABS
50.0000 mg | ORAL_TABLET | Freq: Every day | ORAL | Status: DC
  Administered 2021-06-27 – 2021-06-29 (×3): 50 mg via ORAL
  Filled 2021-06-27 (×3): qty 1

## 2021-06-27 MED ORDER — THROMBIN 5000 UNITS EX SOLR
OROMUCOSAL | Status: DC | PRN
  Administered 2021-06-27: 5 mL via TOPICAL

## 2021-06-27 MED ORDER — ACETAMINOPHEN 325 MG PO TABS
650.0000 mg | ORAL_TABLET | ORAL | Status: DC | PRN
  Administered 2021-06-28 – 2021-06-29 (×2): 650 mg via ORAL
  Filled 2021-06-27 (×2): qty 2

## 2021-06-27 MED ORDER — EPHEDRINE SULFATE-NACL 50-0.9 MG/10ML-% IV SOSY
PREFILLED_SYRINGE | INTRAVENOUS | Status: DC | PRN
  Administered 2021-06-27: 5 mg via INTRAVENOUS

## 2021-06-27 MED ORDER — THIAMINE HCL 100 MG PO TABS
100.0000 mg | ORAL_TABLET | Freq: Every day | ORAL | Status: DC
  Administered 2021-06-27 – 2021-06-29 (×3): 100 mg via ORAL
  Filled 2021-06-27 (×4): qty 1

## 2021-06-27 MED ORDER — 0.9 % SODIUM CHLORIDE (POUR BTL) OPTIME
TOPICAL | Status: DC | PRN
  Administered 2021-06-27 (×2): 1000 mL

## 2021-06-27 MED ORDER — LIDOCAINE-EPINEPHRINE 1 %-1:100000 IJ SOLN
INTRAMUSCULAR | Status: DC | PRN
  Administered 2021-06-27: 10 mL via INTRADERMAL

## 2021-06-27 MED ORDER — ONDANSETRON HCL 4 MG PO TABS: 4.0000 mg | ORAL_TABLET | ORAL | Status: DC | PRN

## 2021-06-27 MED ORDER — SODIUM CHLORIDE 0.9 % IV SOLN: INTRAVENOUS | Status: DC | PRN

## 2021-06-27 MED ORDER — ACETAMINOPHEN 650 MG RE SUPP: 650.0000 mg | RECTAL | Status: DC | PRN

## 2021-06-27 MED ORDER — LABETALOL HCL 5 MG/ML IV SOLN
10.0000 mg | INTRAVENOUS | Status: DC | PRN
  Administered 2021-06-27: 15:00:00 20 mg via INTRAVENOUS
  Filled 2021-06-27: qty 8

## 2021-06-27 MED ORDER — LEVOTHYROXINE SODIUM 75 MCG PO TABS
175.0000 ug | ORAL_TABLET | Freq: Every day | ORAL | Status: DC
  Administered 2021-06-28 – 2021-06-29 (×2): 175 ug via ORAL
  Filled 2021-06-27 (×2): qty 1

## 2021-06-27 MED ORDER — CHLORHEXIDINE GLUCONATE CLOTH 2 % EX PADS
6.0000 | MEDICATED_PAD | Freq: Every day | CUTANEOUS | Status: DC
  Administered 2021-06-27 – 2021-06-29 (×3): 6 via TOPICAL

## 2021-06-27 MED ORDER — AMISULPRIDE (ANTIEMETIC) 5 MG/2ML IV SOLN: 10.0000 mg | Freq: Once | INTRAVENOUS | Status: DC | PRN

## 2021-06-27 MED ORDER — LIDOCAINE-EPINEPHRINE 1 %-1:100000 IJ SOLN
INTRAMUSCULAR | Status: AC
  Filled 2021-06-27: qty 1

## 2021-06-27 MED ORDER — PROMETHAZINE HCL 25 MG/ML IJ SOLN: 6.2500 mg | INTRAMUSCULAR | Status: DC | PRN

## 2021-06-27 MED ORDER — SUGAMMADEX SODIUM 200 MG/2ML IV SOLN
INTRAVENOUS | Status: DC | PRN
  Administered 2021-06-27 (×2): 100 mg via INTRAVENOUS

## 2021-06-27 MED ORDER — FENTANYL CITRATE (PF) 100 MCG/2ML IJ SOLN
25.0000 ug | INTRAMUSCULAR | Status: DC | PRN
  Administered 2021-06-27 (×2): 50 ug via INTRAVENOUS

## 2021-06-27 MED ORDER — ONDANSETRON HCL 4 MG/2ML IJ SOLN
INTRAMUSCULAR | Status: DC | PRN
  Administered 2021-06-27: 4 mg via INTRAVENOUS

## 2021-06-27 MED ORDER — ACETAMINOPHEN 10 MG/ML IV SOLN
INTRAVENOUS | Status: AC
  Filled 2021-06-27: qty 100

## 2021-06-27 MED ORDER — DEXAMETHASONE SODIUM PHOSPHATE 4 MG/ML IJ SOLN
4.0000 mg | Freq: Four times a day (QID) | INTRAMUSCULAR | Status: DC
  Administered 2021-06-28 – 2021-06-29 (×3): 4 mg via INTRAVENOUS
  Filled 2021-06-27 (×3): qty 1

## 2021-06-27 MED ORDER — EPHEDRINE 5 MG/ML INJ
INTRAVENOUS | Status: AC
  Filled 2021-06-27: qty 5

## 2021-06-27 MED ORDER — ORAL CARE MOUTH RINSE: 15.0000 mL | Freq: Once | OROMUCOSAL | Status: AC

## 2021-06-27 MED ORDER — CEFAZOLIN SODIUM-DEXTROSE 2-4 GM/100ML-% IV SOLN
2.0000 g | INTRAVENOUS | Status: AC
  Administered 2021-06-27: 2 g via INTRAVENOUS

## 2021-06-27 MED ORDER — CHLORHEXIDINE GLUCONATE 0.12 % MT SOLN
15.0000 mL | Freq: Once | OROMUCOSAL | Status: AC
  Administered 2021-06-27: 15 mL via OROMUCOSAL
  Filled 2021-06-27: qty 15

## 2021-06-27 MED ORDER — DEXAMETHASONE SODIUM PHOSPHATE 10 MG/ML IJ SOLN
6.0000 mg | Freq: Four times a day (QID) | INTRAMUSCULAR | Status: AC
  Administered 2021-06-27 – 2021-06-28 (×4): 6 mg via INTRAVENOUS
  Filled 2021-06-27 (×4): qty 1

## 2021-06-27 MED ORDER — LIDOCAINE 2% (20 MG/ML) 5 ML SYRINGE
INTRAMUSCULAR | Status: DC | PRN
  Administered 2021-06-27: 60 mg via INTRAVENOUS

## 2021-06-27 MED ORDER — FENTANYL CITRATE (PF) 100 MCG/2ML IJ SOLN
INTRAMUSCULAR | Status: AC
  Filled 2021-06-27: qty 2

## 2021-06-27 MED ORDER — VERAPAMIL HCL ER 240 MG PO TBCR
240.0000 mg | EXTENDED_RELEASE_TABLET | Freq: Every day | ORAL | Status: DC
  Administered 2021-06-27 – 2021-06-29 (×2): 240 mg via ORAL
  Filled 2021-06-27 (×6): qty 1

## 2021-06-27 MED ORDER — CEFAZOLIN SODIUM-DEXTROSE 1-4 GM/50ML-% IV SOLN
1.0000 g | Freq: Three times a day (TID) | INTRAVENOUS | Status: AC
  Administered 2021-06-27 – 2021-06-28 (×2): 1 g via INTRAVENOUS
  Filled 2021-06-27 (×2): qty 50

## 2021-06-27 MED ORDER — POTASSIUM CHLORIDE IN NACL 20-0.9 MEQ/L-% IV SOLN
INTRAVENOUS | Status: DC
  Filled 2021-06-27 (×3): qty 1000

## 2021-06-27 MED ORDER — SODIUM CHLORIDE 0.9 % IV SOLN: INTRAVENOUS | Status: DC

## 2021-06-27 MED ORDER — THROMBIN 20000 UNITS EX SOLR
CUTANEOUS | Status: AC
  Filled 2021-06-27: qty 20000

## 2021-06-27 MED ORDER — ONDANSETRON HCL 4 MG/2ML IJ SOLN: 4.0000 mg | INTRAMUSCULAR | Status: DC | PRN

## 2021-06-27 MED ORDER — ROCURONIUM BROMIDE 10 MG/ML (PF) SYRINGE
PREFILLED_SYRINGE | INTRAVENOUS | Status: AC
  Filled 2021-06-27: qty 10

## 2021-06-27 MED ORDER — PROMETHAZINE HCL 25 MG PO TABS: 12.5000 mg | ORAL_TABLET | ORAL | Status: DC | PRN

## 2021-06-27 MED ORDER — BACITRACIN ZINC 500 UNIT/GM EX OINT
TOPICAL_OINTMENT | CUTANEOUS | Status: DC | PRN
  Administered 2021-06-27: 1 via TOPICAL

## 2021-06-27 MED ORDER — OXYCODONE HCL 5 MG/5ML PO SOLN: 5.0000 mg | Freq: Once | ORAL | Status: DC | PRN

## 2021-06-27 MED ORDER — ROCURONIUM BROMIDE 10 MG/ML (PF) SYRINGE
PREFILLED_SYRINGE | INTRAVENOUS | Status: DC | PRN
  Administered 2021-06-27: 50 mg via INTRAVENOUS

## 2021-06-27 MED ORDER — CEFAZOLIN SODIUM-DEXTROSE 2-4 GM/100ML-% IV SOLN
INTRAVENOUS | Status: AC
  Filled 2021-06-27: qty 100

## 2021-06-27 SURGICAL SUPPLY — 53 items
BAG COUNTER SPONGE SURGICOUNT (BAG) ×2 IMPLANT
BAND RUBBER #18 3X1/16 STRL (MISCELLANEOUS) IMPLANT
BUR SPIRAL ROUTER 2.3 (BUR) ×2 IMPLANT
CANISTER SUCT 3000ML PPV (MISCELLANEOUS) ×2 IMPLANT
CLIP VESOCCLUDE MED 6/CT (CLIP) IMPLANT
DRAPE MICROSCOPE LEICA (MISCELLANEOUS) IMPLANT
DRAPE NEUROLOGICAL W/INCISE (DRAPES) ×2 IMPLANT
DRAPE SURG 17X23 STRL (DRAPES) IMPLANT
DRAPE WARM FLUID 44X44 (DRAPES) ×2 IMPLANT
DURAPREP 6ML APPLICATOR 50/CS (WOUND CARE) ×2 IMPLANT
ELECT REM PT RETURN 9FT ADLT (ELECTROSURGICAL) ×2
ELECTRODE REM PT RTRN 9FT ADLT (ELECTROSURGICAL) ×1 IMPLANT
EVACUATOR 1/8 PVC DRAIN (DRAIN) IMPLANT
GAUZE 4X4 16PLY ~~LOC~~+RFID DBL (SPONGE) IMPLANT
GAUZE SPONGE 4X4 12PLY STRL (GAUZE/BANDAGES/DRESSINGS) ×2 IMPLANT
GLOVE SURG ENC MOIS LTX SZ7 (GLOVE) IMPLANT
GLOVE SURG ENC MOIS LTX SZ8 (GLOVE) ×2 IMPLANT
GLOVE SURG LTX SZ7 (GLOVE) ×2 IMPLANT
GLOVE SURG UNDER POLY LF SZ7 (GLOVE) IMPLANT
GOWN STRL REUS W/ TWL LRG LVL3 (GOWN DISPOSABLE) IMPLANT
GOWN STRL REUS W/ TWL XL LVL3 (GOWN DISPOSABLE) IMPLANT
GOWN STRL REUS W/TWL 2XL LVL3 (GOWN DISPOSABLE) IMPLANT
GOWN STRL REUS W/TWL LRG LVL3 (GOWN DISPOSABLE)
GOWN STRL REUS W/TWL XL LVL3 (GOWN DISPOSABLE)
HEMOSTAT POWDER KIT SURGIFOAM (HEMOSTASIS) ×2 IMPLANT
KIT BASIN OR (CUSTOM PROCEDURE TRAY) ×2 IMPLANT
KIT TURNOVER KIT B (KITS) ×2 IMPLANT
NEEDLE HYPO 22GX1.5 SAFETY (NEEDLE) ×2 IMPLANT
NS IRRIG 1000ML POUR BTL (IV SOLUTION) ×2 IMPLANT
PACK CRANIOTOMY CUSTOM (CUSTOM PROCEDURE TRAY) ×2 IMPLANT
PAD ARMBOARD 7.5X6 YLW CONV (MISCELLANEOUS) ×2 IMPLANT
PATTIES SURGICAL .25X.25 (GAUZE/BANDAGES/DRESSINGS) IMPLANT
PATTIES SURGICAL .5 X.5 (GAUZE/BANDAGES/DRESSINGS) IMPLANT
PATTIES SURGICAL .5 X3 (DISPOSABLE) IMPLANT
PATTIES SURGICAL 1X1 (DISPOSABLE) IMPLANT
PERFORATOR LRG  14-11MM (BIT) ×2
PERFORATOR LRG 14-11MM (BIT) ×1 IMPLANT
PIN MAYFIELD SKULL DISP (PIN) IMPLANT
PLATE CRANIAL CMF UNIV (Plate) ×2 IMPLANT
SCREW UNIII AXS SD 1.5X4 (Screw) ×14 IMPLANT
SPONGE NEURO XRAY DETECT 1X3 (DISPOSABLE) IMPLANT
SPONGE SURGIFOAM ABS GEL 100 (HEMOSTASIS) ×2 IMPLANT
STAPLER VISISTAT 35W (STAPLE) ×2 IMPLANT
SUT ETHILON 3 0 FSL (SUTURE) IMPLANT
SUT NURALON 4 0 TR CR/8 (SUTURE) ×4 IMPLANT
SUT VIC AB 2-0 CP2 18 (SUTURE) ×4 IMPLANT
SYR CONTROL 10ML LL (SYRINGE) ×2 IMPLANT
TAPE CLOTH SURG 4X10 WHT LF (GAUZE/BANDAGES/DRESSINGS) ×2 IMPLANT
TOWEL GREEN STERILE (TOWEL DISPOSABLE) ×2 IMPLANT
TOWEL GREEN STERILE FF (TOWEL DISPOSABLE) ×2 IMPLANT
TRAY FOLEY MTR SLVR 16FR STAT (SET/KITS/TRAYS/PACK) IMPLANT
UNDERPAD 30X36 HEAVY ABSORB (UNDERPADS AND DIAPERS) IMPLANT
WATER STERILE IRR 1000ML POUR (IV SOLUTION) ×2 IMPLANT

## 2021-06-27 NOTE — Anesthesia Preprocedure Evaluation (Addendum)
Anesthesia Evaluation  Patient identified by MRN, date of birth, ID band Patient confused    Reviewed: Allergy & Precautions, NPO status , Patient's Chart, lab work & pertinent test results  Airway Mallampati: III  TM Distance: >3 FB Neck ROM: Full    Dental no notable dental hx.    Pulmonary sleep apnea ,    Pulmonary exam normal breath sounds clear to auscultation       Cardiovascular hypertension, Pt. on medications Normal cardiovascular exam Rhythm:Regular Rate:Normal     Neuro/Psych  Headaches, Memory loss Aphasia     GI/Hepatic negative GI ROS, Neg liver ROS,   Endo/Other  Hypothyroidism   Renal/GU negative Renal ROS     Musculoskeletal  (+) Arthritis ,   Abdominal   Peds  Hematology negative hematology ROS (+)   Anesthesia Other Findings SDH  Reproductive/Obstetrics                            Anesthesia Physical Anesthesia Plan  ASA: 3  Anesthesia Plan: General   Post-op Pain Management:    Induction: Intravenous  PONV Risk Score and Plan: 3 and Ondansetron, Dexamethasone and Treatment may vary due to age or medical condition  Airway Management Planned: Oral ETT  Additional Equipment:   Intra-op Plan:   Post-operative Plan: Extubation in OR  Informed Consent: I have reviewed the patients History and Physical, chart, labs and discussed the procedure including the risks, benefits and alternatives for the proposed anesthesia with the patient or authorized representative who has indicated his/her understanding and acceptance.     Dental advisory given and Consent reviewed with POA  Plan Discussed with: CRNA  Anesthesia Plan Comments: (Anesthetic plan discussed with patient and husband Potential clearsight versus arterial line discussed)       Anesthesia Quick Evaluation

## 2021-06-27 NOTE — Anesthesia Procedure Notes (Signed)
Procedure Name: Intubation Date/Time: 06/27/2021 9:17 AM Performed by: Trinna Post., CRNA Pre-anesthesia Checklist: Patient identified, Emergency Drugs available, Suction available, Patient being monitored and Timeout performed Patient Re-evaluated:Patient Re-evaluated prior to induction Oxygen Delivery Method: Circle system utilized Preoxygenation: Pre-oxygenation with 100% oxygen Induction Type: IV induction Ventilation: Mask ventilation without difficulty Laryngoscope Size: Glidescope and 3 Grade View: Grade I Tube type: Oral Tube size: 7.0 mm Number of attempts: 1 Airway Equipment and Method: Rigid stylet and Video-laryngoscopy Placement Confirmation: ETT inserted through vocal cords under direct vision, positive ETCO2 and breath sounds checked- equal and bilateral Secured at: 21 cm Tube secured with: Tape Dental Injury: Teeth and Oropharynx as per pre-operative assessment

## 2021-06-27 NOTE — Anesthesia Postprocedure Evaluation (Signed)
Anesthesia Post Note  Patient: Christina [name]  Procedure(s) Performed: LEFT CRANIOTOMY HEMATOMA EVACUATION SUBDURAL (Left: Head)     Patient location during evaluation: PACU Anesthesia Type: General Level of consciousness: awake Pain management: pain level controlled Vital Signs Assessment: post-procedure vital signs reviewed and stable Respiratory status: spontaneous breathing, nonlabored ventilation, respiratory function stable and patient connected to nasal cannula oxygen Cardiovascular status: blood pressure returned to baseline and stable Postop Assessment: no apparent nausea or vomiting Anesthetic complications: no   No notable events documented.  Last Vitals:  Vitals:   06/27/21 1700 06/27/21 1800  BP: (!) 155/86 (!) 152/82  Pulse: 69 63  Resp: 15 15  Temp:    SpO2: 96% 95%    Last Pain:  Vitals:   06/27/21 1719  TempSrc:   PainSc: 2                  [name] P Grettell Ransdell

## 2021-06-27 NOTE — Op Note (Signed)
06/27/2021  10:43 AM  PATIENT:  Christina [name]  60 y.o. female  PRE-OPERATIVE DIAGNOSIS: Recurrent left chronic subdural hematoma  POST-OPERATIVE DIAGNOSIS:  same  PROCEDURE: Redo left parietal craniotomy for evacuation of subdural hematoma  SURGEON:  Sherley Bounds, MD  ASSISTANTS: Glenford Peers FNP  ANESTHESIA:   General  EBL: Less than 100 ml  Total I/O In: 1100 [I.V.:1000; IV Piggyback:100] Out: 100 [Blood:100]  BLOOD ADMINISTERED: none  DRAINS: Subdural Blake drain and subgaleal Hemovac  SPECIMEN:  none  INDICATION FOR PROCEDURE: This patient presented with mild headaches and some mild speech difficulty. Imaging showed fairly large recurrent left chronic subdural hematoma 2 weeks after craniotomy for subdural.. The patient tried conservative measures without relief. Pain was debilitating. Recommended redo craniotomy for evacuation of the recurrent subdural hematoma. Patient understood the risks, benefits, and alternatives and potential outcomes and wished to proceed.  PROCEDURE DETAILS: The patient was taken to the operating room and after induction of adequate generalized endotracheal anesthesia, the head was turned to the right to expose the left frontotemporal parietal region. The head was then cleaned and then prepped with alcohol and DuraPrep and draped in the usual sterile fashion. 10 cc of local anesthetic was injected, and a curvilinear incision was made on the left of the head through the old incision.  The screws were removed from the plates and the flap was then placed in bacitracin-containing saline solution, and the dura was opened to expose a large amount of dark chronic subdural hematoma as well as some soft hematoma.. A hematoma was then removed with a combination of irrigation and suction. I continued to irrigate until the irrigant was clear to, and dried any bleeding with bipolar cautery. I then placed a subdural drain through separate stab incision and close the dura  with a running 4-0 Nurolon suture. Dural tack up sutures were placed. The dura was lined with Gelfoam, and the craniotomy flap was replaced with doggie-bone plates. The wound was copiously irrigated. A subgaleal drain was placed, and the galea was then closed with interrupted 2-0 Vicryl suture. The skin was then closed with staples a sterile dressing was applied. The patient was then taken out of the 3-point [place] headrest and awakened from general anesthesia, and transported to the recovery room in stable condition. At the end of the procedure all sponge, needle, and instrument counts were correct.    PLAN OF CARE: Admit to inpatient   PATIENT DISPOSITION:  PACU - hemodynamically stable.   Delay start of Pharmacological VTE agent (>24hrs) due to surgical blood loss or risk of bleeding:  yes

## 2021-06-27 NOTE — H&P (Signed)
Subjective: Patient is a 60 y.o. female admitted for recurrent L SDH. Onset of symptoms was a few days ago, unchanged since that time.  The pain is rated mild, and is a headache. The pain is described as aching and occurs intermittently. The symptoms have been progressive. Symptoms are exacerbated by nothing in particular. MRI or CT showed recurrent L CSDH   Past Medical History:  Diagnosis Date   Allergic rhinitis    Arthritis    Cleft palate    COVID 06/04/2021   mild to no symptoms   COVID-19 07/31/2019   tested positive, states mild aches/pains, fatigue   Diarrhea due to drug 06/30/2015   Fibroid    Grave's disease    H/O insomnia    H/O menorrhagia 2012   Headache    History of chicken pox    Hypertension    Hyperthyroidism    s/p rai 8/06 now hypothyroid Dr. 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 JO   Sleep apnea    has cpap does not use   Vitamin D deficiency     Past Surgical History:  Procedure Laterality Date   BUNIONECTOMY  1986   CRANIOTOMY N/A 06/17/2016   Procedure: RIGHT CRANIOTOMY HEMATOMA EVACUATION SUBDURAL;  Surgeon: Eustace Moore, MD;  Location: [place];  Service: Neurosurgery;  Laterality: N/A;   CRANIOTOMY Right 06/18/2016   Procedure: CRANIOTOMY HEMATOMA EVACUATION SUBDURAL RIGHT;  Surgeon: Eustace Moore, MD;  Location: [place];  Service: Neurosurgery;  Laterality: Right;   CRANIOTOMY Left 06/04/2021   Procedure: CRANIOTOMY HEMATOMA EVACUATION SUBDURAL;  Surgeon: Eustace Moore, MD;  Location: [place];  Service: Neurosurgery;  Laterality: Left;   DILATION AND CURETTAGE OF UTERUS  09/15/2010   HYSTEROSCOPY WITH D & C  09/28/2010   MOUTH SURGERY     NOVASURE ABLATION  09/28/2010   REFRACTIVE SURGERY     for vision   TONSILLECTOMY  1965    Prior to Admission medications   Medication  Sig Start Date End Date Taking? Authorizing Provider  acetaminophen (TYLENOL) 325 MG tablet Take 650 mg by mouth every 6 (six) hours as needed for moderate pain or headache.   Yes [provider]  dasatinib (SPRYCEL) 50 MG tablet Take 50 mg by mouth daily.   Yes [provider]  HYDROcodone-acetaminophen (NORCO/VICODIN) 5-325 MG tablet Take 1 tablet by mouth every 4 (four) hours as needed for moderate pain. 06/05/21  Yes Meyran, Ocie Cornfield, NP  levETIRAcetam (KEPPRA) 500 MG tablet Take 1 tablet (500 mg total) by mouth every 12 (twelve) hours. 06/05/21  Yes Meyran, Ocie Cornfield, NP  losartan (COZAAR) 50 MG tablet Take 1 tablet (50 mg total) by mouth daily. 05/02/21  Yes Panosh, Standley Brooking, MD  metroNIDAZOLE (METROCREAM) 0.75 % cream Apply 1 application topically daily. Patient taking differently: Apply 1 application topically daily as needed (dry skin). 03/10/21  Yes   SYNTHROID 175 MCG tablet TAKE 1 TABLET (175 MCG TOTAL) BY MOUTH DAILY BEFORE BREAKFAST. Patient taking differently: Take 175 mcg by mouth daily. 08/14/20 08/16/21 Yes Panosh, Standley Brooking, MD  verapamil (CALAN-SR) 240 MG CR tablet TAKE 1 TABLET BY MOUTH AT BEDTIME. Patient taking differently: Take 240 mg by mouth at bedtime. 07/06/20 07/06/21 Yes Panosh, Standley Brooking, MD  zolpidem (AMBIEN) 10  MG tablet TAKE 1 TABLET BY MOUTH AT BEDTIME AS NEEDED FOR SLEEP Patient taking differently: Take 10 mg by mouth at bedtime. 05/31/21 11/27/21 Yes Panosh, Standley Brooking, MD  diazepam (VALIUM) 5 MG tablet Take 1 tablet by mouth 30-40 minutes prior to MRI Patient taking differently: Take 5 mg by mouth See admin instructions. Take 1 tablet by mouth 30-40 minutes prior to MRI 04/19/21   Pieter Partridge, DO  methylPREDNISolone (MEDROL DOSEPAK) 4 MG TBPK tablet Take as directed Patient not taking: Reported on 06/04/2021 06/02/21   Dorothyann Peng, NP  nortriptyline (PAMELOR) 25 MG capsule Take 1 capsule (25 mg total) by mouth at bedtime. 05/31/21   Pieter Partridge, DO  thiamine (VITAMIN B-1) 100 MG tablet Take 100 mg by mouth daily.    [provider]   No Known Allergies  Social History   Tobacco Use   Smoking status: Never   Smokeless tobacco: Never  Substance Use Topics   Alcohol use: Yes    Comment: socially    Family History  Problem Relation Age of Onset   Diabetes Mother    Cancer Mother 12       ovarian ca   Breast cancer Mother    Hypertension Other    Prostate cancer Other        grandfather   Hypertension Father    Cancer Maternal Uncle        leukemia   Colon cancer Neg Hx    Esophageal cancer Neg Hx    Rectal cancer Neg Hx    Stomach cancer Neg Hx    Colon polyps Neg Hx      Review of Systems  Positive ROS: neg  All other systems have been reviewed and were otherwise negative with the exception of those mentioned in the HPI and as above.  Objective: Vital signs in last 24 hours: Temp:  [97.7 F (36.5 C)] 97.7 F (36.5 C) (11/20 0712) Pulse Rate:  [59] 59 (11/20 0712) Resp:  [18] 18 (11/20 0712) BP: (123)/(74) 123/74 (11/20 0712) SpO2:  [95 %] 95 % (11/20 0712) Weight:  [70.3 kg] 70.3 kg (11/20 0712)  General Appearance: Alert, cooperative, no distress, appears stated age Head: Normocephalic, without obvious abnormality, atraumatic Eyes: PERRL, conjunctiva/corneas clear, EOM's intact    Neck: Supple, symmetrical, trachea midline Back: Symmetric, no curvature, ROM normal, no CVA tenderness Lungs:  respirations unlabored Heart: Regular rate and rhythm Abdomen: Soft, non-tender Extremities: Extremities normal, atraumatic, no cyanosis or edema Pulses: 2+ and symmetric all extremities Skin: Skin color, texture, turgor normal, no rashes or lesions  NEUROLOGIC:   Mental status: Alert and oriented x4,  no aphasia, good attention span, fund of knowledge, and memory Motor Exam - grossly normal Sensory Exam - grossly normal Reflexes: 1= Coordination - grossly normal Gait - grossly  normal Balance - grossly normal Cranial Nerves: I: smell Not tested  II: visual acuity  OS: nl    OD: nl  II: visual fields Full to confrontation  II: pupils Equal, round, reactive to light  III,VII: ptosis None  III,IV,VI: extraocular muscles  Full ROM  V: mastication Normal  V: facial light touch sensation  Normal  V,VII: corneal reflex  Present  VII: facial muscle function - upper  Normal  VII: facial muscle function - lower Normal  VIII: hearing Not tested  IX: soft palate elevation  Normal  IX,X: gag reflex Present  XI: trapezius strength  5/5  XI: sternocleidomastoid strength 5/5  XI:  neck flexion strength  5/5  XII: tongue strength  Normal    Data Review Lab Results  Component Value Date   WBC 4.7 06/27/2021   HGB 13.0 06/27/2021   HCT 38.6 06/27/2021   MCV 92.6 06/27/2021   PLT 351 06/27/2021   Lab Results  Component Value Date   NA 136 06/27/2021   K 3.8 06/27/2021   CL 104 06/27/2021   CO2 22 06/27/2021   BUN 15 06/27/2021   CREATININE 0.77 06/27/2021   GLUCOSE 98 06/27/2021   Lab Results  Component Value Date   INR 0.9 06/04/2021    Assessment/Plan:  Estimated body mass index is 23.57 kg/m as calculated from the following:   Height as of this encounter: 5\' 8"  (1.727 m).   Weight as of this encounter: 70.3 kg. Patient admitted for redo l crani for SDH. Patient has failed a reasonable attempt at conservative therapy.  I explained the condition and procedure to the patient and answered any questions.  Patient wishes to proceed with procedure as planned. Understands risks/ benefits and typical outcomes of procedure.   Eustace Moore 06/27/2021 8:14 AM

## 2021-06-27 NOTE — Transfer of Care (Signed)
Immediate Anesthesia Transfer of Care Note  Patient: Christina [name]  Procedure(s) Performed: LEFT CRANIOTOMY HEMATOMA EVACUATION SUBDURAL (Left: Head)  Patient Location: PACU  Anesthesia Type:General  Level of Consciousness: awake, alert  and oriented  Airway & Oxygen Therapy: Patient Spontanous Breathing and Patient connected to nasal cannula oxygen  Post-op Assessment: Report given to RN and Post -op Vital signs reviewed and stable  Post vital signs: Reviewed and stable  Last Vitals:  Vitals Value Taken Time  BP 157/75 06/27/21 1105  Temp    Pulse 66 06/27/21 1109  Resp 8 06/27/21 1109  SpO2 96 % 06/27/21 1109  Vitals shown include unvalidated device data.  Last Pain:  Vitals:   06/27/21 0712  PainSc: 0-No pain      Patients Stated Pain Goal: 3 (72/25/75 0518)  Complications: No notable events documented.

## 2021-06-27 NOTE — Progress Notes (Addendum)
Pacu RN Report to floor given  Gave report to [employer]. [phone]. Discussed surgery, meds given in OR and Pacu, VS, IV fluids given, EBL, urine output, pain and other pertinent information. Also discussed if pt had any family or friends here or belongings with them.   Moving all extremities. All neuro is intact. Pt uses hearing aides and has a speech impediment when speaking which is normal for her. Tongue midline, shoulder shrugs/smile equal, all grips strong and dorsi/planter flexion. Able to verbalize objects and speech is as clear as it will be for her r/t hearing loss.   Pt exits my care.

## 2021-06-28 ENCOUNTER — Inpatient Hospital Stay (HOSPITAL_COMMUNITY): Payer: 59

## 2021-06-28 ENCOUNTER — Encounter (HOSPITAL_COMMUNITY): Payer: Self-pay | Admitting: Neurological Surgery

## 2021-06-28 ENCOUNTER — Ambulatory Visit: Payer: Self-pay | Admitting: *Deleted

## 2021-06-28 IMAGING — CT CT HEAD W/O CM
4 series · 15 of 47 positions shown, 17 images · non-contrast
Comparison: Prior head CT examinations [DATE] and earlier.
Brain MRI [DATE].

CLINICAL DATA: Subdural hemorrhage.

EXAM:
CT HEAD WITHOUT CONTRAST
TECHNIQUE: Contiguous axial images were obtained from the base of the skull
through the vertex without intravenous contrast.

[Series 2: head (person_name) · axial · 0.42mm/px · z∈[-124,-4]mm · 7 of 32 slices shown, 9 images]
[im 4/32  brain]
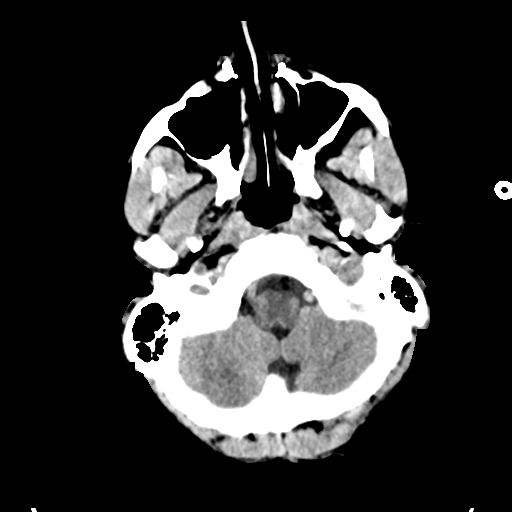
[im 4/32  bone]
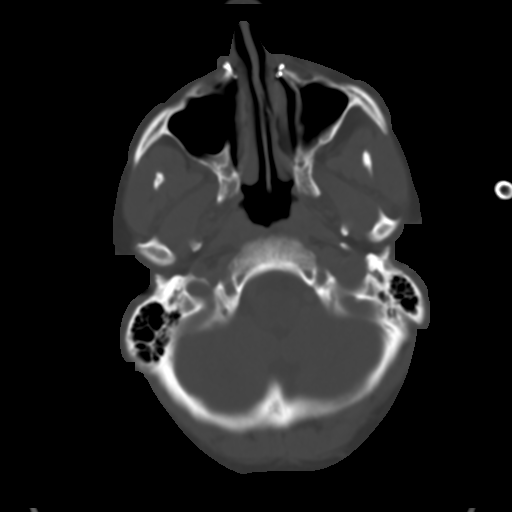
[im 8/32  brain]
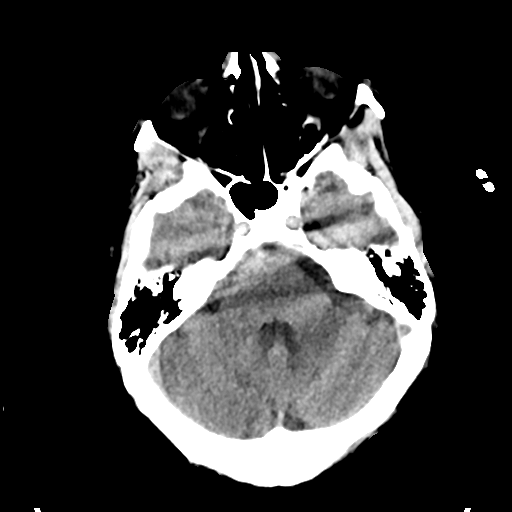
[im 12/32  brain]
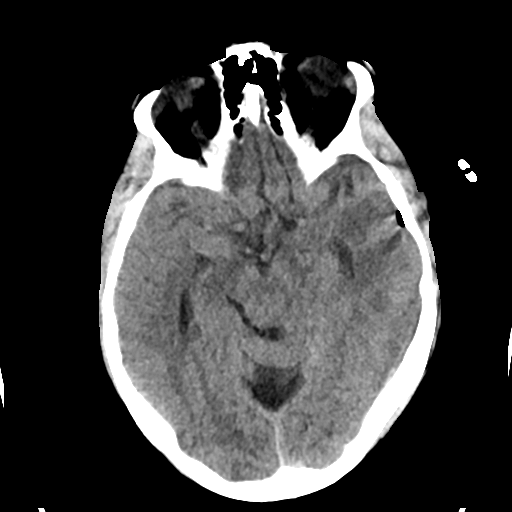
[im 16/32  brain]
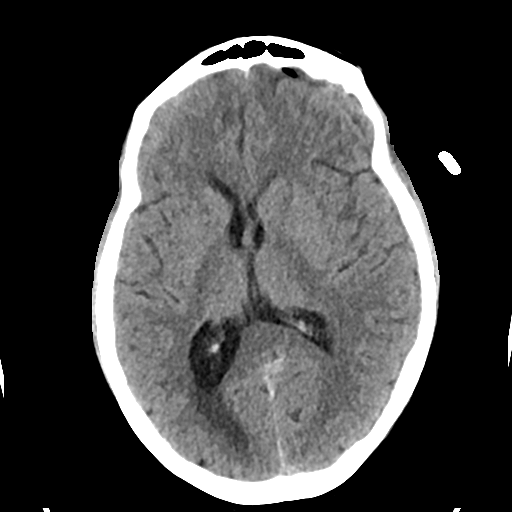
[im 20/32  brain]
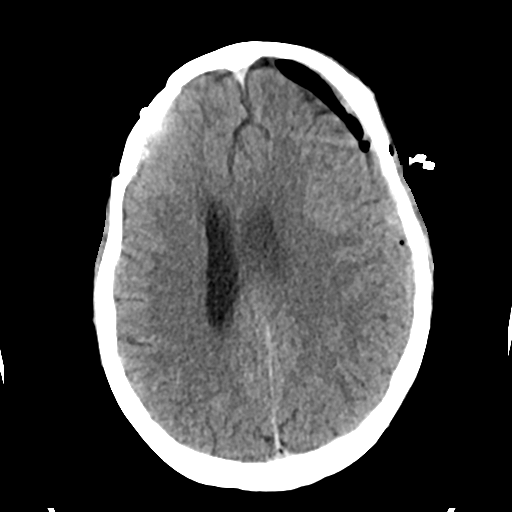
[im 20/32  bone]
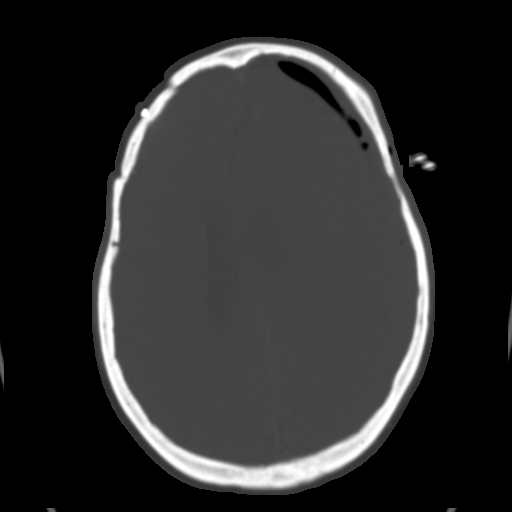
[im 24/32  brain]
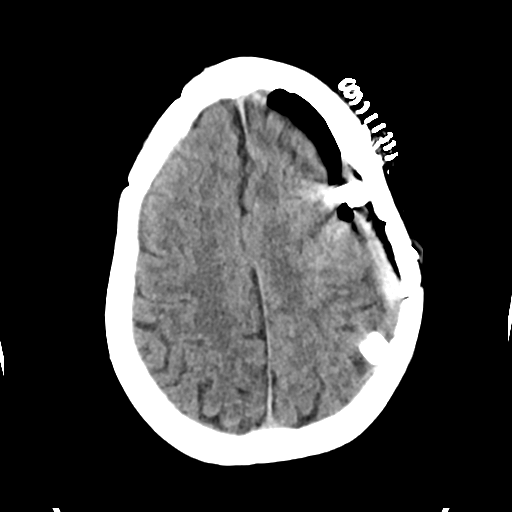
[im 28/32  brain]
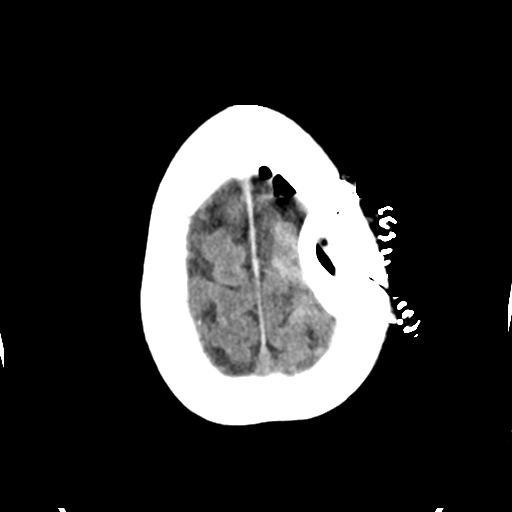

[Series 3: head bone · axial · 0.42mm/px · z∈[-124,-108]mm · 2 of 80 slices shown]
[im 8/80  bone]
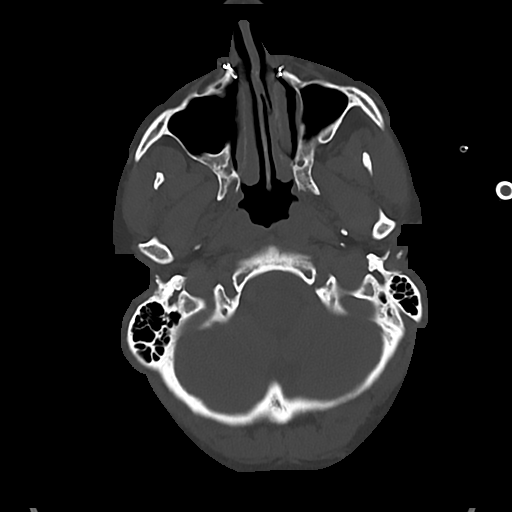
[im 16/80  bone]
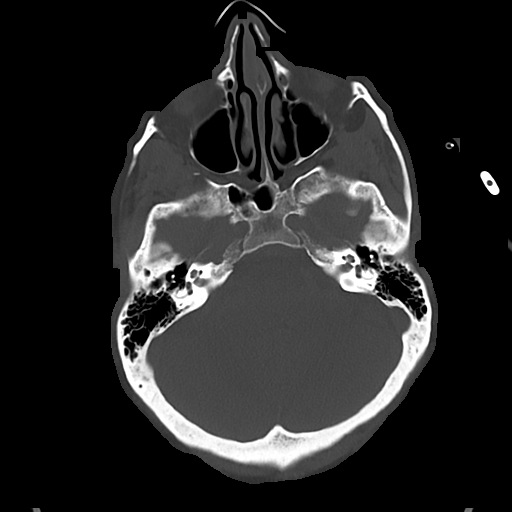

[Series 4: cor soft · coronal · 0.33mm/px · 3 of 69 slices shown]
[im 23/69  brain]
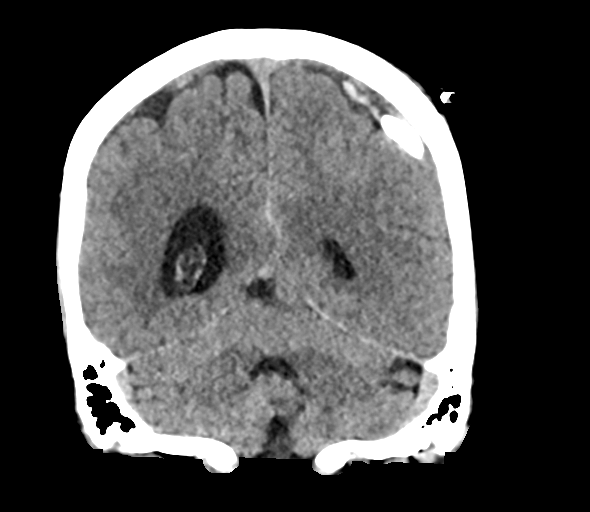
[im 31/69  brain]
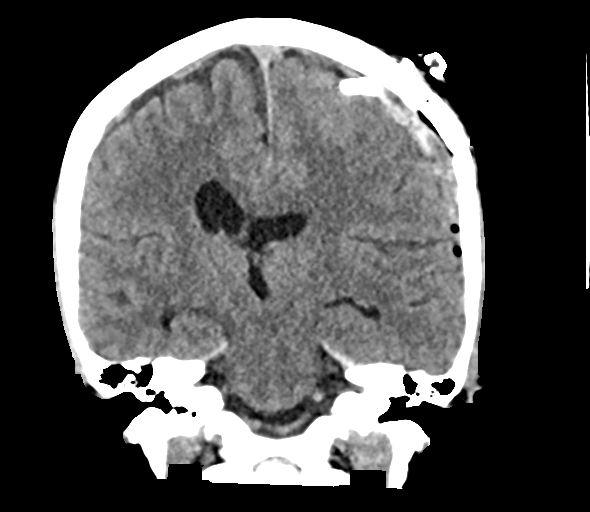
[im 38/69  brain]
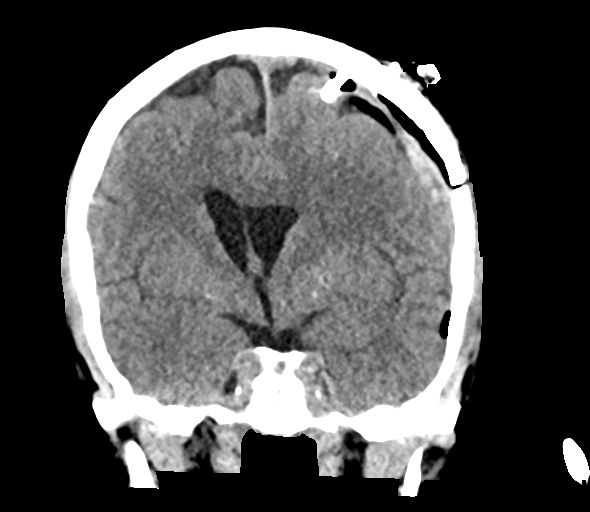

[Series 5: sag soft · sagittal · 0.32mm/px · 3 of 64 slices shown]
[im 22/64  brain]
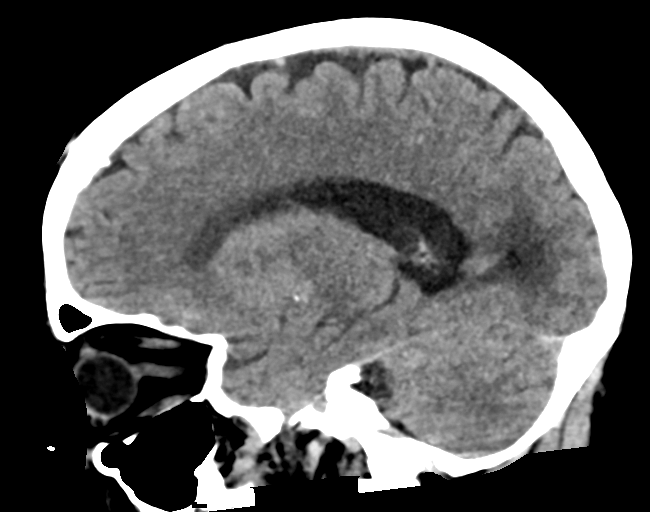
[im 32/64  brain]
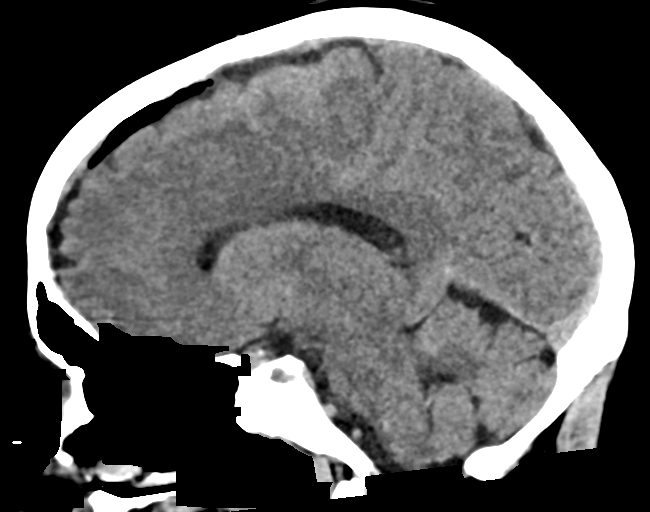
[im 43/64  brain]
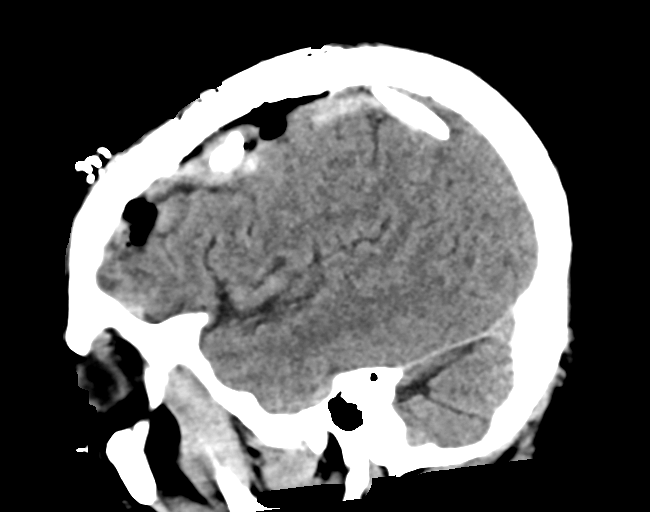

[15 of 47 positions shown; findings below may reference images not displayed]

FINDINGS: Brain:

Since the prior head CT of [DATE], there are postoperative
changes from interval left subdural hematoma evacuation. Two drains
terminate within the subdural space along the left parietal lobe.
The subdural hematoma has significantly decreased in size, and there
are now small to moderate volume foci of extra-axial pneumocephalus
along the left cerebral hemisphere. The residual subdural hematoma
measures up to 9 mm in greatest thickness (previously 16 mm) and
contains both chronic and acute/early subacute blood products.

Mass effect upon the underlying left cerebral hemisphere has
decreased. Rightward midline shift now measures 10 mm at the level
of the septum pellucidum (previously 16 mm).

Unchanged chronic dural thickening/trace subdural collection along
the right cerebral hemisphere.

There is no acute demarcated cortical infarction.

No evidence of an intracranial mass.

Vascular: No hyperdense vessel.

Skull: Sequela of prior bilateral craniotomies.

Sinuses/Orbits: Visualized orbits show no acute finding. No
significant paranasal sinus disease.

Other: There is a drain within the scalp soft tissues overlying the
left-sided cranioplasty. Adjacent scalp staples.
IMPRESSION: Since the prior head CT of [DATE], there has been interval left
subdural hematoma evacuation, as described. Residual mixed density
subdural hematoma along the left cerebral hemisphere, now measuring
9 mm in greatest thickness (previously 16 mm). Small to moderate
volume extra-axial pneumocephalus now present along the left
cerebral hemisphere.

Interval decrease in mass effect. Rightward midline shift now
measures 10 mm at the level of the septum pellucidum (previously 16
mm).

Unchanged chronic dural thickening/trace subdural collection along
the right cerebral hemisphere.

## 2021-06-28 MED ORDER — ZOLPIDEM TARTRATE 5 MG PO TABS
10.0000 mg | ORAL_TABLET | Freq: Every evening | ORAL | Status: DC | PRN
  Administered 2021-06-28: 10 mg via ORAL
  Filled 2021-06-28: qty 2

## 2021-06-28 MED ORDER — PANTOPRAZOLE SODIUM 40 MG PO TBEC
40.0000 mg | DELAYED_RELEASE_TABLET | Freq: Every day | ORAL | Status: DC
  Administered 2021-06-29: 40 mg via ORAL
  Filled 2021-06-28: qty 1

## 2021-06-28 NOTE — Progress Notes (Signed)
Margo Aye, NP returned page and gave verbal order for this RN to order Ambien 10mg  one tablet by mouth at bedtime as needed for sleep per pt request.

## 2021-06-28 NOTE — Plan of Care (Signed)

## 2021-06-28 NOTE — Consult Note (Signed)
   [name] Inpatient Consult   06/28/2021  LACRESHIA BONDARENKO August 23, 1960 [id number]   [place] [facility] [ACO] Patient: Christina [name]  Primary Care Provider:  Burnis Medin, MD, [facility] [place]   Patient has been currently assigned to a [facility] for telephonic chronic disease management services.    [name]: Referral for [name] members for post hospital follow up and/or community resource support.     For additional questions or referrals please contact:   Natividad Brood, RN BSN [facility] Liaison  [phone] business mobile phone Toll free office [phone]  Fax number: [phone] [email or web address]

## 2021-06-28 NOTE — Progress Notes (Signed)
Subjective: Patient reports some headaches but doing well  Objective: Vital signs in last 24 hours: Temp:  [97 F (36.1 C)-98.1 F (36.7 C)] 97.7 F (36.5 C) (11/21 0800) Pulse Rate:  [53-82] 77 (11/21 0900) Resp:  [9-19] 17 (11/21 0900) BP: (133-172)/(59-88) 140/79 (11/21 0900) SpO2:  [92 %-98 %] 96 % (11/21 0900)  Intake/Output from previous day: 11/20 0701 - 11/21 0700 In: 2140.3 [I.V.:1940.3; IV Piggyback:200] Out: 8676 [Urine:980; Drains:175; Blood:100] Intake/Output this shift: Total I/O In: 907.6 [P.O.:480; I.V.:427.6] Out: 250 [Urine:250]  Neurologic: Grossly normal  Lab Results: Lab Results  Component Value Date   WBC 4.7 06/27/2021   HGB 13.0 06/27/2021   HCT 38.6 06/27/2021   MCV 92.6 06/27/2021   PLT 351 06/27/2021   Lab Results  Component Value Date   INR 0.9 06/04/2021   BMET Lab Results  Component Value Date   NA 136 06/27/2021   K 3.8 06/27/2021   CL 104 06/27/2021   CO2 22 06/27/2021   GLUCOSE 98 06/27/2021   BUN 15 06/27/2021   CREATININE 0.77 06/27/2021   CALCIUM 9.6 06/27/2021    Studies/Results: CT HEAD WO CONTRAST  Result Date: 06/28/2021 CLINICAL DATA:  Subdural hemorrhage. EXAM: CT HEAD WITHOUT CONTRAST TECHNIQUE: Contiguous axial images were obtained from the base of the skull through the vertex without intravenous contrast. COMPARISON:  Prior head CT examinations 06/25/2021 and earlier. Brain MRI 05/02/2021. FINDINGS: Brain: Since the prior head CT of 06/25/2021, there are postoperative changes from interval left subdural hematoma evacuation. Two drains terminate within the subdural space along the left parietal lobe. The subdural hematoma has significantly decreased in size, and there are now small to moderate volume foci of extra-axial pneumocephalus along the left cerebral hemisphere. The residual subdural hematoma measures up to 9 mm in greatest thickness (previously 16 mm) and contains both chronic and acute/early subacute blood  products. Mass effect upon the underlying left cerebral hemisphere has decreased. Rightward midline shift now measures 10 mm at the level of the septum pellucidum (previously 16 mm). Unchanged chronic dural thickening/trace subdural collection along the right cerebral hemisphere. There is no acute demarcated cortical infarction. No evidence of an intracranial mass. Vascular: No hyperdense vessel. Skull: Sequela of prior bilateral craniotomies. Sinuses/Orbits: Visualized orbits show no acute finding. No significant paranasal sinus disease. Other: There is a drain within the scalp soft tissues overlying the left-sided cranioplasty. Adjacent scalp staples. IMPRESSION: Since the prior head CT of 06/25/2021, there has been interval left subdural hematoma evacuation, as described. Residual mixed density subdural hematoma along the left cerebral hemisphere, now measuring 9 mm in greatest thickness (previously 16 mm). Small to moderate volume extra-axial pneumocephalus now present along the left cerebral hemisphere. Interval decrease in mass effect. Rightward midline shift now measures 10 mm at the level of the septum pellucidum (previously 16 mm). Unchanged chronic dural thickening/trace subdural collection along the right cerebral hemisphere. Electronically Signed   By: Kellie Simmering D.O.   On: 06/28/2021 08:22    Assessment/Plan: Postop day 1 crani for evacuation of sdh. Doing well, therapy today. Will transfer to progressive. Continue drain until tomorrow likely    LOS: 1 day    Donnell Beauchamp Gradyn Shein 06/28/2021, 10:26 AM

## 2021-06-28 NOTE — Progress Notes (Signed)
Pt states that she at home she normally uses Ambien 10 mg at bedtime as needed for sleep. This medication is listed on pt PTA meds but not ordered on her MAR to give. Provider paged.

## 2021-06-28 NOTE — Evaluation (Signed)
Physical Therapy Evaluation Patient Details Name: Christina [name] MRN: [id number] DOB: 1961-05-09 Today's Date: 06/28/2021  History of Present Illness  60 yo female s/p redo L parietal craniotomy for evacuation of SDH on 11/20. PMH includes OA, grave's disease, HTN, leukemia, OSA, R crani 2017, L crani 06/04/2021.   Clinical Impression  Pt presents with impaired balance with preference for R lateral leaning, impaired activity tolerance, and mild post-operative headache-type pain. Pt to benefit from acute PT to address deficits. Pt ambulated hallway distance, requiring occasional steadying assist and cues for tracking in middle of hallway vs R lateral bias. PT anticipates pt will progress well post-op, supportive husband at bedside. PT to progress mobility as tolerated, and will continue to follow acutely.         Recommendations for follow up therapy are one component of a multi-disciplinary discharge planning process, led by the attending physician.  Recommendations may be updated based on patient status, additional functional criteria and insurance authorization.  Follow Up Recommendations No PT follow up    Assistance Recommended at Discharge Intermittent Supervision/Assistance  Functional Status Assessment Patient has had a recent decline in their functional status and demonstrates the ability to make significant improvements in function in a reasonable and predictable amount of time.  Equipment Recommendations  None recommended by PT    Recommendations for Other Services       Precautions / Restrictions Precautions Precautions: Fall Restrictions Weight Bearing Restrictions: No      Mobility  Bed Mobility Overal bed mobility: Needs Assistance Bed Mobility: Supine to Sit     Supine to sit: Supervision;HOB elevated     General bed mobility comments: for safety, lines/leads management.    Transfers Overall transfer level: Needs assistance Equipment used: Rolling walker (2  wheels);None Transfers: Sit to/from Stand Sit to Stand: Min guard           General transfer comment: for safety, slow to rise. STS x3, from EOB x2 and toilet x1.    Ambulation/Gait Ambulation/Gait assistance: Min guard;Min assist Gait Distance (Feet): 300 Feet Assistive device: None Gait Pattern/deviations: Step-through pattern;Decreased stride length;Staggering right Gait velocity: decr     General Gait Details: close guard for safety, occasional steadying assist and cues to correct R lateral listing during gait.  Stairs            Wheelchair Mobility    Modified Rankin (Stroke Patients Only)       Balance Overall balance assessment: Needs assistance Sitting-balance support: No upper extremity supported;Feet supported Sitting balance-Leahy Scale: Good     Standing balance support: No upper extremity supported;During functional activity Standing balance-Leahy Scale: Fair Standing balance comment: R lateral bias                             Pertinent Vitals/Pain Pain Assessment: 0-10 Pain Score: 3  Pain Location: head Pain Descriptors / Indicators: Headache Pain Intervention(s): Limited activity within patient's tolerance;Monitored during session;Patient requesting pain meds-RN notified    Home Living Family/patient expects to be discharged to:: Private residence Living Arrangements: Spouse/significant other;Children Available Help at Discharge: Family;Available 24 hours/day Type of Home: House Home Access: Stairs to enter   [employer] of Steps: 2   Home Layout: One level Home Equipment: None      Prior Function Prior Level of Function : Independent/Modified Independent;Driving;Working/employed             Mobility Comments: works as Nurse, 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 [employer]  Hand Dominance   Dominant Hand: Right    Extremity/Trunk Assessment   Upper Extremity Assessment Upper Extremity Assessment: Defer to OT evaluation     Lower Extremity Assessment Lower Extremity Assessment: Overall WFL for tasks assessed    Cervical / Trunk Assessment Cervical / Trunk Assessment: Normal  Communication   Communication: HOH  Cognition Arousal/Alertness: Awake/alert Behavior During Therapy: WFL for tasks assessed/performed Overall Cognitive Status: Within Functional Limits for tasks assessed                                          General Comments General comments (skin integrity, edema, etc.): vss    Exercises     Assessment/Plan    PT Assessment Patient needs continued PT services  PT Problem List Decreased mobility;Decreased safety awareness;Decreased activity tolerance;Decreased balance;Pain       PT Treatment Interventions Therapeutic activities;Gait training;Therapeutic exercise;Patient/family education;Balance training;Stair training;Functional mobility training;Neuromuscular re-education    PT Goals (Current goals can be found in the Care Plan section)  Acute Rehab PT Goals Patient Stated Goal: home PT Goal Formulation: With patient Time For Goal Achievement: 07/05/21 Potential to Achieve Goals: Good    Frequency Min 4X/week   Barriers to discharge        Co-evaluation               AM-PAC PT "6 Clicks" Mobility  Outcome Measure Help needed turning from your back to your side while in a flat bed without using bedrails?: None Help needed moving from lying on your back to sitting on the side of a flat bed without using bedrails?: None Help needed moving to and from a bed to a chair (including a wheelchair)?: None Help needed standing up from a chair using your arms (e.g., wheelchair or bedside chair)?: None Help needed to walk in hospital room?: A Little Help needed climbing 3-5 steps with a railing? : A Little 6 Click Score: 22    End of Session   Activity Tolerance: Patient tolerated treatment well Patient left: in chair;with call bell/phone within reach;with  family/visitor present;Other (comment) (per husband, he will be with pt and pt expresses she will press call button and wait for assist prior to mobilizing out of chair) Nurse Communication: Mobility status PT Visit Diagnosis: Other abnormalities of gait and mobility (R26.89);Unsteadiness on feet (R26.81)    Time: 0923-3007 PT Time Calculation (min) (ACUTE ONLY): 20 min   Charges:   PT Evaluation $PT Eval Low Complexity: 1 Low         Graelyn Bihl S, PT DPT Acute Rehabilitation Services Pager [phone]  Office [phone]   Zavian Slowey E Ruffin Pyo 06/28/2021, 1:29 PM

## 2021-06-29 ENCOUNTER — Telehealth: Payer: Self-pay | Admitting: Hematology and Oncology

## 2021-06-29 ENCOUNTER — Other Ambulatory Visit (HOSPITAL_COMMUNITY): Payer: Self-pay

## 2021-06-29 MED ORDER — ATORVASTATIN CALCIUM 10 MG PO TABS
20.0000 mg | ORAL_TABLET | Freq: Every day | ORAL | Status: DC
  Administered 2021-06-29: 20 mg via ORAL
  Filled 2021-06-29: qty 2

## 2021-06-29 MED ORDER — DEXAMETHASONE 4 MG PO TABS
4.0000 mg | ORAL_TABLET | Freq: Four times a day (QID) | ORAL | Status: DC
  Administered 2021-06-29: 4 mg via ORAL
  Filled 2021-06-29: qty 1

## 2021-06-29 MED ORDER — ATORVASTATIN CALCIUM 20 MG PO TABS
20.0000 mg | ORAL_TABLET | Freq: Every day | ORAL | 1 refills | Status: DC
  Filled 2021-06-29: qty 30, 30d supply, fill #0

## 2021-06-29 NOTE — Discharge Summary (Addendum)
Physician Discharge Summary  Patient ID: Christina [name] MRN: [id number] DOB/AGE: Aug 26, 1960 60 y.o.  Admit date: 06/27/2021 Discharge date: 06/29/2021  Admission Diagnoses: recurrent SDH   Discharge Diagnoses: same   Discharged Condition: good  Hospital Course: The patient was admitted on 06/27/2021 and taken to the operating room where the patient underwent repeat L craniotomy for SDH. The patient tolerated the procedure well and was taken to the recovery room and then to the floor in stable condition. The hospital course was routine. There were no complications. The wound remained clean dry and intact. Pt had appropriate head soreness. No complaints of new N/T/W. Follow up head CT looked good. The patient remained afebrile with stable vital signs, and tolerated a regular diet. The patient continued to increase activities, and pain was well controlled with oral pain medications. Drain removed this am on rounds.  Consults: None  Significant Diagnostic Studies:  Results for orders placed or performed during the hospital encounter of 06/27/21  MRSA Next Gen by PCR, Nasal   Specimen: Nasal Mucosa; Nasal Swab  Result Value Ref Range   MRSA by PCR Next Gen NOT DETECTED NOT DETECTED  Basic metabolic panel per protocol  Result Value Ref Range   Sodium 136 135 - 145 mmol/L   Potassium 3.8 3.5 - 5.1 mmol/L   Chloride 104 98 - 111 mmol/L   CO2 22 22 - 32 mmol/L   Glucose, Bld 98 70 - 99 mg/dL   BUN 15 6 - 20 mg/dL   Creatinine, Ser 0.77 0.44 - 1.00 mg/dL   Calcium 9.6 8.9 - 10.3 mg/dL   GFR, Estimated >60 >60 mL/min   Anion gap 10 5 - 15  CBC per protocol  Result Value Ref Range   WBC 4.7 4.0 - 10.5 K/uL   RBC 4.17 3.87 - 5.11 MIL/uL   Hemoglobin 13.0 12.0 - 15.0 g/dL   HCT 38.6 36.0 - 46.0 %   MCV 92.6 80.0 - 100.0 fL   MCH 31.2 26.0 - 34.0 pg   MCHC 33.7 30.0 - 36.0 g/dL   RDW 13.0 11.5 - 15.5 %   Platelets 351 150 - 400 K/uL   nRBC 0.0 0.0 - 0.2 %  Type and screen [place]  Result Value Ref Range   ABO/RH(D) O POS    Antibody Screen NEG    Sample Expiration      06/30/2021,2359 Performed at [facility], [address], [place], [place] [address]     CT HEAD WO CONTRAST  Result Date: 06/28/2021 CLINICAL DATA:  Subdural hemorrhage. EXAM: CT HEAD WITHOUT CONTRAST TECHNIQUE: Contiguous axial images were obtained from the base of the skull through the vertex without intravenous contrast. COMPARISON:  Prior head CT examinations 06/25/2021 and earlier. Brain MRI 05/02/2021. FINDINGS: Brain: Since the prior head CT of 06/25/2021, there are postoperative changes from interval left subdural hematoma evacuation. Two drains terminate within the subdural space along the left parietal lobe. The subdural hematoma has significantly decreased in size, and there are now small to moderate volume foci of extra-axial pneumocephalus along the left cerebral hemisphere. The residual subdural hematoma measures up to 9 mm in greatest thickness (previously 16 mm) and contains both chronic and acute/early subacute blood products. Mass effect upon the underlying left cerebral hemisphere has decreased. Rightward midline shift now measures 10 mm at the level of the septum pellucidum (previously 16 mm). Unchanged chronic dural thickening/trace subdural collection along the right cerebral hemisphere. There is  no acute demarcated cortical infarction. No evidence of an intracranial mass. Vascular: No hyperdense vessel. Skull: Sequela of prior bilateral craniotomies. Sinuses/Orbits: Visualized orbits show no acute finding. No significant paranasal sinus disease. Other: There is a drain within the scalp soft tissues overlying the left-sided cranioplasty. Adjacent scalp staples. IMPRESSION: Since the prior head CT of 06/25/2021, there has been interval left subdural hematoma evacuation, as described. Residual mixed density subdural hematoma along the left cerebral hemisphere, now  measuring 9 mm in greatest thickness (previously 16 mm). Small to moderate volume extra-axial pneumocephalus now present along the left cerebral hemisphere. Interval decrease in mass effect. Rightward midline shift now measures 10 mm at the level of the septum pellucidum (previously 16 mm). Unchanged chronic dural thickening/trace subdural collection along the right cerebral hemisphere. Electronically Signed   By: Kellie Simmering D.O.   On: 06/28/2021 08:22   CT HEAD WO CONTRAST (5MM)  Addendum Date: 06/25/2021   ADDENDUM REPORT: 06/25/2021 15:13 ADDENDUM: These results were called by telephone at the time of interpretation on 06/25/2021 at 3:13 pm to provider DAVID Ronnald Ramp , who verbally acknowledged these results. Electronically Signed   By: Franchot Gallo M.D.   On: 06/25/2021 15:13   Result Date: 06/25/2021 CLINICAL DATA:  History of subdural hematoma with craniotomy. EXAM: CT HEAD WITHOUT CONTRAST TECHNIQUE: Contiguous axial images were obtained from the base of the skull through the vertex without intravenous contrast. COMPARISON:  CT head 06/05/2021 FINDINGS: Brain: Large left subdural hematoma has increased in size significantly. The hematoma is predominately low density but there are scattered areas of increased density compatible with recent hemorrhage. Subdural gas has resolved since the prior study. Subdural fluid collection on the left now measures 16 mm. There is increased mass-effect with 16 mm midline shift. Mild trapping of the right ventricle. No subdural hematoma on the right. Negative for acute infarct. Vascular: Negative for hyperdense vessel Skull: Bilateral craniotomy Sinuses/Orbits: Negative Other: None IMPRESSION: Large recurrent left subdural hematoma. This now measures 16 mm in thickness. Increased midline shift which is now 16 mm. There is enlargement trapping of the right lateral ventricle. Call report currently in progress. Electronically Signed: By: Franchot Gallo M.D. On:  06/25/2021 14:09   CT HEAD WO CONTRAST  Result Date: 06/05/2021 CLINICAL DATA:  Follow-up intracranial hemorrhage. EXAM: CT HEAD WITHOUT CONTRAST TECHNIQUE: Contiguous axial images were obtained from the base of the skull through the vertex without intravenous contrast. COMPARISON:  Head CT from yesterday FINDINGS: Brain: Interval drainage of subdural hematoma on the left with drain in place. The collection is now mainly gas that is anteriorly accumulated. Blood clot is present primarily deep to the bone flap and measuring up to 1 cm in thickness. The gas collection measures up to 17 mm thick with frontal lobe mass effect. Milder pneumocephalus along the right anterior frontal lobe. No hydrocephalus or acute infarct. Midline shift measures 7 mm. Vascular: No hyperdense vessel or unexpected calcification. Skull: Unremarkable left-sided craniotomy. Sinuses/Orbits: Negative Other: Motion degradation at the vertex. IMPRESSION: Subdural hematoma decompression on the left with improved midline shift now measuring 7 mm. The left-sided collection is now primarily gas and blood clot deep to the bone flap. Electronically Signed   By: Jorje Guild M.D.   On: 06/05/2021 08:48   CT HEAD WO CONTRAST (5MM)  Result Date: 06/04/2021 CLINICAL DATA:  Headache for 3 days, injury to the head in May 2022, history of craniotomy EXAM: CT HEAD WITHOUT CONTRAST TECHNIQUE: Contiguous axial images were obtained  from the base of the skull through the vertex without intravenous contrast. COMPARISON:  Brain MRI 05/02/2021 FINDINGS: Brain: There is a 1.8 cm maximally thick predominantly hypodense subdural collection overlying the left cerebral convexity. There are hyperdense blood products within the collection anteriorly suggesting acute on subacute/chronic hematoma. A collection was present on the brain MRI from 05/02/2021, but this collection is significantly increased in size. The collection exerts significant mass effect on the  brain parenchyma with partial effacement of the left lateral ventricle and up to 1.1 cm rightward midline shift. There is a trace residual subdural collection on the right measuring up to approximately 3 mm in thickness, not significantly changed compared to the prior MRI. There is no evidence of acute infarct.  There is no solid lesion. Vascular: No hyperdense vessel or unexpected calcification. Skull: Postsurgical changes reflecting right frontal craniotomy are noted. There is no calvarial fracture. There is no suspicious osseous lesion. Postsurgical changes are also noted in the bilateral maxillary sinuses. Sinuses/Orbits: The paranasal sinuses are clear. The globes and orbits are unremarkable. Other: None. IMPRESSION: Compared to the brain MRI from 05/02/2021. 1. 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 2. Trace right subdural collection, not significantly changed in size. These results were called by telephone at the time of interpretation on 06/04/2021 at 12:21 pm to provider ADAM JAFFE , who verbally acknowledged these results. Electronically Signed   By: Valetta Mole M.D.   On: 06/04/2021 12:22    Antibiotics:  Anti-infectives (From admission, onward)    Start     Dose/Rate Route Frequency Ordered Stop   06/27/21 1730  ceFAZolin (ANCEF) IVPB 1 g/50 mL premix        1 g 100 mL/hr over 30 Minutes Intravenous Every 8 hours 06/27/21 1256 06/28/21 0151   06/27/21 0900  ceFAZolin (ANCEF) IVPB 2g/100 mL premix        2 g 200 mL/hr over 30 Minutes Intravenous On call to O.R. 06/27/21 [id number] 06/27/21 0950   06/27/21 0816  ceFAZolin (ANCEF) 2-4 GM/100ML-% IVPB       Note to Pharmacy: Harden Mo   : cabinet override      06/27/21 0816 06/27/21 0942       Discharge Exam: Blood pressure (!)  165/93, pulse 83, temperature 98.4 F (36.9 C), temperature source Oral, resp. rate 17, height 5\' 8"  (1.727 m), weight 74.5 kg, SpO2 93 %. Neurologic: Grossly normal Incision CDI  Discharge Medications:   Lipitor Keppra tylenol  Disposition: home   Final Dx: craniotomy for SDH  Discharge Instructions     Call MD for:  difficulty breathing, headache or visual disturbances   Complete by: As directed    Call MD for:  persistant nausea and vomiting   Complete by: As directed    Call MD for:  redness, tenderness, or signs of infection (pain, swelling, redness, odor or green/yellow discharge around incision site)   Complete by: As directed    Call MD for:  severe uncontrolled pain   Complete by: As directed    Call MD for:  temperature >100.4   Complete by: As directed    Diet - low sodium heart healthy   Complete by: As directed    Increase activity slowly   Complete by: As directed    No wound care   Complete  by: As directed           Signed: Rickey Farrier Dredyn Gubbels 06/29/2021, 11:50 AM

## 2021-06-29 NOTE — Telephone Encounter (Signed)
I spoke with her husband and reviewed her chart I was informed that she was readmitted to the hospital recently for subarachnoid hemorrhage We did chart review and I consulted another colleague regarding the causes of her spontaneous subarachnoid hemorrhage There has been some case report of spontaneous CNS bleeding with Sprycel although this would be considered a rare side effects given that she is on a very low-dose of Sprycel At this point in time, I recommend holding off treatment until recovery Her husband agreed We also discussed potential work-up to rule out bleeding dyscrasias We have agreed to delay her future appointment until January and I will cancel her scheduled appointment in December.

## 2021-06-29 NOTE — Progress Notes (Signed)
Physical Therapy Treatment Patient Details Name: Christina [name] MRN: [id number] DOB: [birth year] Today's Date: 06/29/2021   History of Present Illness 60 yo female s/p redo L parietal craniotomy for evacuation of SDH on 11/20. PMH includes OA, grave's disease, HTN, leukemia, OSA, R crani 2017, L crani 06/04/2021.    PT Comments    Pt  eager to d/c home, is dressed and ready upon PT arrival to room. PT focused session on challenging pt's higher level dynamic balance, demonstrates some difficulty with horizontal head turns but pt aware of deficits. Pt ambulated good hallway distance with less overall unsteadiness today, PT still encouraging close supervision from husband once d/c to ensure pt safety during mobility, both pt and husband understand. Pt appropriate to d/c home from a PT standpoint, all questions answered.     Recommendations for follow up therapy are one component of a multi-disciplinary discharge planning process, led by the attending physician.  Recommendations may be updated based on patient status, additional functional criteria and insurance authorization.  Follow Up Recommendations  No PT follow up     Assistance Recommended at Discharge Intermittent Supervision/Assistance  Equipment Recommendations  None recommended by PT    Recommendations for Other Services       Precautions / Restrictions Precautions Precautions: Fall Restrictions Weight Bearing Restrictions: No     Mobility  Bed Mobility Overal bed mobility: Independent             General bed mobility comments: up with OT upon PT arrival    Transfers Overall transfer level: Independent                 General transfer comment: up with OT    Ambulation/Gait Ambulation/Gait assistance: Supervision Gait Distance (Feet): 450 Feet Assistive device: None Gait Pattern/deviations: Step-through pattern;Decreased stride length Gait velocity: decr     General Gait Details: for safety, closer  guard when challenging balance (head turns, fast/slow gait). min weaving of gait with challenge, pt and husband aware and husband will be providing supervision for all mobility at home.   Stairs Stairs: Yes Stairs assistance: Supervision Stair Management: No rails;One rail Right;Alternating pattern;Forwards Number of Stairs: 10     Wheelchair Mobility    Modified Rankin (Stroke Patients Only)       Balance Overall balance assessment: Mild deficits observed, not formally tested;Needs assistance   Sitting balance-Leahy Scale: Good     Standing balance support: During functional activity Standing balance-Leahy Scale: Good Standing balance comment: min unsteadiness with horizontal head turns               High Level Balance Comments: pt noted with vision occlusion that the patient sways and encouraged to sit for showering and dressing. pt losing balance with dresssing and still attempting to stand needing min cues to sit            Cognition Arousal/Alertness: Awake/alert Behavior During Therapy: WFL for tasks assessed/performed Overall Cognitive Status: Within Functional Limits for tasks assessed                                          Exercises      General Comments        Pertinent Vitals/Pain Pain Assessment: No/denies pain    Home Living Family/patient expects to be discharged to:: Private residence Living Arrangements: Spouse/significant other;Children Available Help at Discharge: Family;Available 24 hours/day Type  of Home: House Home Access: Stairs to enter Entrance Stairs-Rails: None Entrance Stairs-Number of Steps: 2   Home Layout: One level Home Equipment: Hand held shower head;Shower seat;Shower seat - built in Additional Comments: spouse reports he will be off for the remainder of this week and next for patient as needed    Prior Function            PT Goals (current goals can now be found in the care plan section)  Acute Rehab PT Goals Patient Stated Goal: home PT Goal Formulation: With patient Time For Goal Achievement: 07/05/21 Potential to Achieve Goals: Good Progress towards PT goals: Progressing toward goals    Frequency    Min 4X/week      PT Plan Current plan remains appropriate    Co-evaluation              AM-PAC PT "6 Clicks" Mobility   Outcome Measure  Help needed turning from your back to your side while in a flat bed without using bedrails?: None Help needed moving from lying on your back to sitting on the side of a flat bed without using bedrails?: None Help needed moving to and from a bed to a chair (including a wheelchair)?: None Help needed standing up from a chair using your arms (e.g., wheelchair or bedside chair)?: None Help needed to walk in hospital room?: A Little Help needed climbing 3-5 steps with a railing? : A Little 6 Click Score: 22    End of Session   Activity Tolerance: Patient tolerated treatment well Patient left: with call bell/phone within reach;with family/visitor present (pt up with husband) Nurse Communication: Mobility status PT Visit Diagnosis: Other abnormalities of gait and mobility (R26.89);Unsteadiness on feet (R26.81)     Time: 5462-7035 PT Time Calculation (min) (ACUTE ONLY): 9 min  Charges:  $Neuromuscular Re-education: 8-22 mins                     Stacie Glaze, PT DPT Acute Rehabilitation Services Pager [phone]  Office [phone]    Christina [name] 06/29/2021, 10:15 AM

## 2021-06-29 NOTE — Evaluation (Signed)
Occupational Therapy Evaluation Patient Details Name: Christina [name] MRN: [id number] DOB: Sep 23, 1960 Today's Date: 06/29/2021   History of Present Illness 60 yo female s/p redo L parietal craniotomy for evacuation of SDH on 11/20. PMH includes OA, grave's disease, HTN, leukemia, OSA, R crani 2017, L crani 06/04/2021.   Clinical Impression   Patient evaluated by Occupational Therapy with no further acute OT needs identified. All education has been completed and the patient has no further questions. See below for any follow-up Occupational Therapy or equipment needs. OT to sign off. Thank you for referral.   Educated on washing around the incision and using clean linen every shower      Recommendations for follow up therapy are one component of a multi-disciplinary discharge planning process, led by the attending physician.  Recommendations may be updated based on patient status, additional functional criteria and insurance authorization.   Follow Up Recommendations  No OT follow up    Assistance Recommended at Discharge None  Functional Status Assessment  Patient has had a recent decline in their functional status and demonstrates the ability to make significant improvements in function in a reasonable and predictable amount of time.  Equipment Recommendations  None recommended by OT    Recommendations for Other Services       Precautions / Restrictions Precautions Precautions: Fall      Mobility Bed Mobility Overal bed mobility: Independent                  Transfers Overall transfer level: Independent                        Balance Overall balance assessment: Mild deficits observed, not formally tested;Needs assistance                             High Level Balance Comments: pt noted with vision occlusion that the patient sways and encouraged to sit for showering and dressing. pt losing balance with dresssing and still attempting to stand  needing min cues to sit           ADL either performed or assessed with clinical judgement   ADL Overall ADL's : Independent                                       General ADL Comments: completed grooming task at sink, able to remove all sticky leads, and dressing fully for home. pt able to tie shoes even. pt reports feeling better this time after surgery than last time. pt noted to have some mild balance changes     Vision Baseline Vision/History: 0 No visual deficits Additional Comments: has reading glasses present in the room     Perception     Praxis      Pertinent Vitals/Pain Pain Assessment: No/denies pain     Hand Dominance Right   Extremity/Trunk Assessment Upper Extremity Assessment Upper Extremity Assessment: Overall WFL for tasks assessed   Lower Extremity Assessment Lower Extremity Assessment: Defer to PT evaluation   Cervical / Trunk Assessment Cervical / Trunk Assessment: Normal   Communication Communication Communication: HOH (wears hearing aides/ lip reads)   Cognition Arousal/Alertness: Awake/alert Behavior During Therapy: WFL for tasks assessed/performed Overall Cognitive Status: Within Functional Limits for tasks assessed  General Comments       Exercises     Shoulder Instructions      Home Living Family/patient expects to be discharged to:: Private residence Living Arrangements: Spouse/significant other;Children Available Help at Discharge: Family;Available 24 hours/day Type of Home: House Home Access: Stairs to enter [employer] of Steps: 2 Entrance Stairs-Rails: None Home Layout: One level     Bathroom Shower/Tub: Occupational psychologist: Standard     Home Equipment: Hand held shower head;Shower seat;Shower seat - built in   Additional Comments: spouse reports he will be off for the remainder of this week and next for patient as  needed      Prior Functioning/Environment Prior Level of Function : Independent/Modified Independent;Driving;Working/employed             Mobility Comments: works as Nurse, 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 [employer]: Impaired balance (sitting and/or standing);Decreased cognition      OT Treatment/Interventions:      OT Goals(Current goals can be found in the care plan section) Acute Rehab OT Goals Patient Stated Goal: to go home OT Goal Formulation: With patient/family  OT Frequency:     Barriers to D/C:            Co-evaluation              AM-PAC OT "6 Clicks" Daily Activity     Outcome Measure Help from another person eating meals?: None Help from another person taking care of personal grooming?: None Help from another person toileting, which includes using toliet, bedpan, or urinal?: None Help from another person bathing (including washing, rinsing, drying)?: None Help from another person to put on and taking off regular upper body clothing?: None Help from another person to put on and taking off regular lower body clothing?: None 6 Click Score: 24   End of Session Nurse Communication: Mobility status;Precautions  Activity Tolerance: Patient tolerated treatment well Patient left: Other (comment) (up with PT Alexa exiting the room)  OT Visit Diagnosis: Unsteadiness on feet (R26.81)                Time: 2774-1287 OT Time Calculation (min): 17 min Charges:  OT General Charges $OT Visit: 1 Visit OT Evaluation $OT Eval Moderate Complexity: 1 Mod   Brynn, OTR/L  Acute Rehabilitation Services Pager: [phone] Office: [phone] .   Jeri Modena 06/29/2021, 9:30 AM

## 2021-06-30 ENCOUNTER — Other Ambulatory Visit (HOSPITAL_COMMUNITY): Payer: Self-pay | Admitting: Neurological Surgery

## 2021-06-30 ENCOUNTER — Other Ambulatory Visit: Payer: Self-pay | Admitting: Neurological Surgery

## 2021-06-30 ENCOUNTER — Other Ambulatory Visit: Payer: Self-pay

## 2021-06-30 DIAGNOSIS — I10 Essential (primary) hypertension: Secondary | ICD-10-CM

## 2021-06-30 DIAGNOSIS — I6200 Nontraumatic subdural hemorrhage, unspecified: Secondary | ICD-10-CM

## 2021-07-02 ENCOUNTER — Other Ambulatory Visit (HOSPITAL_COMMUNITY): Payer: Self-pay

## 2021-07-02 MED ORDER — LEVETIRACETAM 500 MG PO TABS
500.0000 mg | ORAL_TABLET | Freq: Two times a day (BID) | ORAL | 0 refills | Status: DC
  Filled 2021-07-02: qty 60, 30d supply, fill #0

## 2021-07-06 ENCOUNTER — Telehealth: Payer: Self-pay | Admitting: *Deleted

## 2021-07-06 NOTE — Chronic Care Management (AMB) (Signed)
  Care Management   Outreach Note  07/06/2021 Name: Christina [name] MRN: [id number] DOB: 1961-07-03  Referred by: Burnis Medin, MD Reason for referral : Care Coordination (Initial outreach to schedule referral with [facility] )   An unsuccessful telephone outreach was attempted today. The patient was referred to the case management team for assistance with care management and care coordination.   Follow Up Plan:  A HIPAA compliant phone message was left for the patient providing contact information and requesting a return call.  The care management team will reach out to the patient again over the next 7 days. If patient returns call to provider office, please advise to call [place]  at [phone].  [place] Management  Direct Dial: [phone]

## 2021-07-07 ENCOUNTER — Other Ambulatory Visit: Payer: Self-pay

## 2021-07-07 ENCOUNTER — Ambulatory Visit (HOSPITAL_COMMUNITY)
Admission: RE | Admit: 2021-07-07 | Discharge: 2021-07-07 | Disposition: A | Discharge status: Home / Self Care | Payer: 59 | Source: Ambulatory Visit | Attending: Neurological Surgery | Admitting: Neurological Surgery

## 2021-07-07 DIAGNOSIS — I6200 Nontraumatic subdural hemorrhage, unspecified: Secondary | ICD-10-CM | POA: Insufficient documentation

## 2021-07-07 DIAGNOSIS — Z87828 Personal history of other (healed) physical injury and trauma: Secondary | ICD-10-CM | POA: Diagnosis not present

## 2021-07-07 DIAGNOSIS — R220 Localized swelling, mass and lump, head: Secondary | ICD-10-CM | POA: Diagnosis not present

## 2021-07-07 IMAGING — CT CT HEAD W/O CM
4 series · 16 of 47 positions shown, 18 images · non-contrast
Comparison: [DATE]

CLINICAL DATA: Follow-up craniotomy secondary to nontraumatic
subdural hematoma.

EXAM:
CT HEAD WITHOUT CONTRAST
TECHNIQUE: Contiguous axial images were obtained from the base of the skull
through the vertex without intravenous contrast.

[Series 2: head wo · axial · 0.47mm/px · z∈[+974,+1109]mm · 7 of 37 slices shown, 9 images]
[im 5/37  brain]
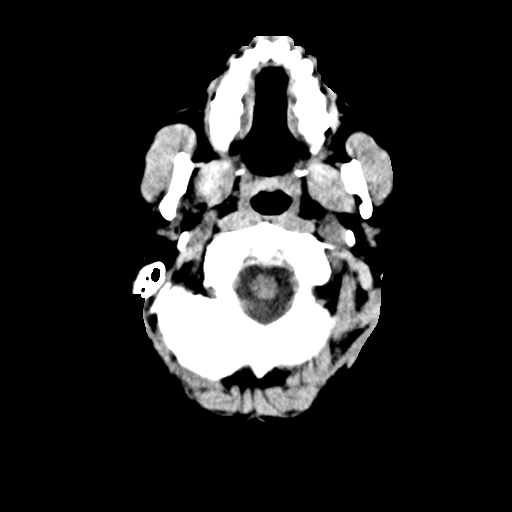
[im 5/37  bone]
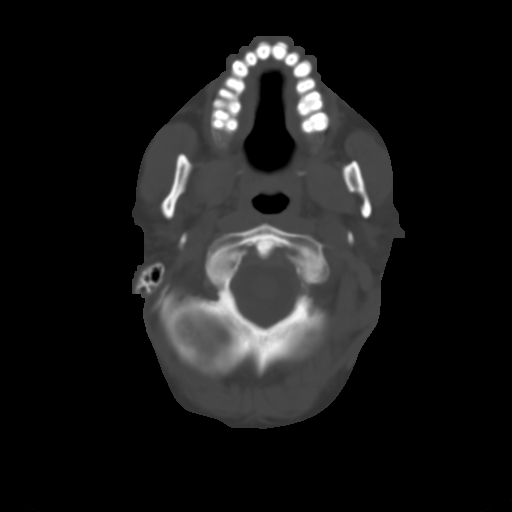
[im 10/37  brain]
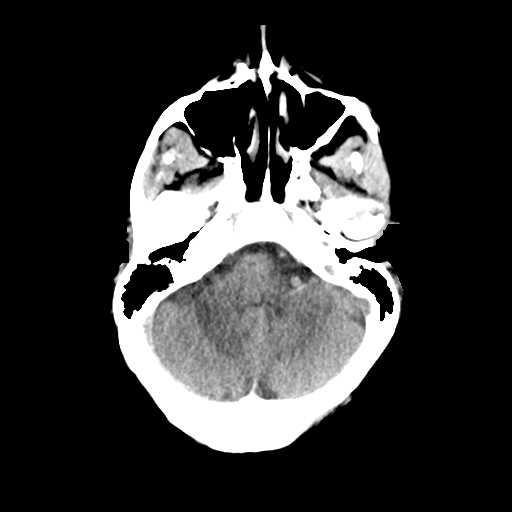
[im 14/37  brain]
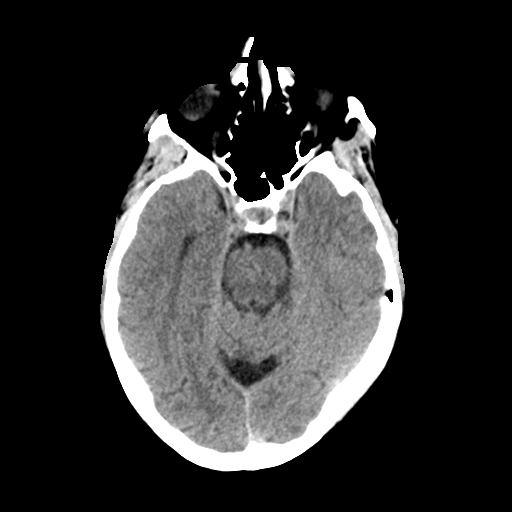
[im 19/37  brain]
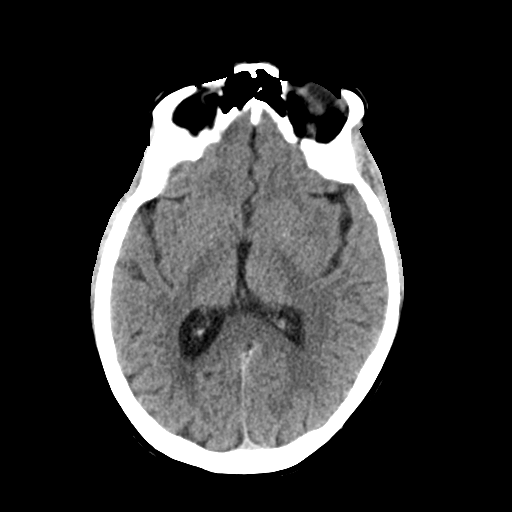
[im 23/37  brain]
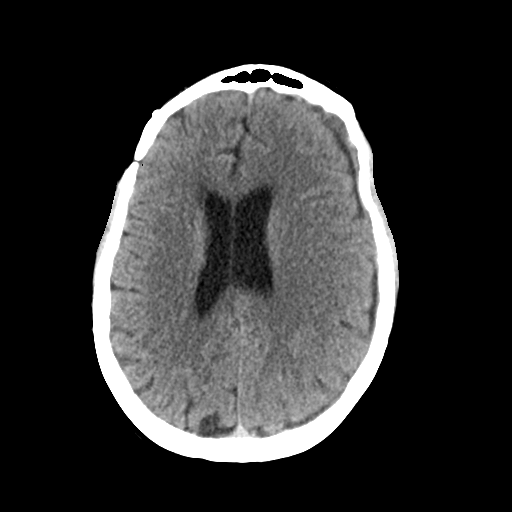
[im 23/37  bone]
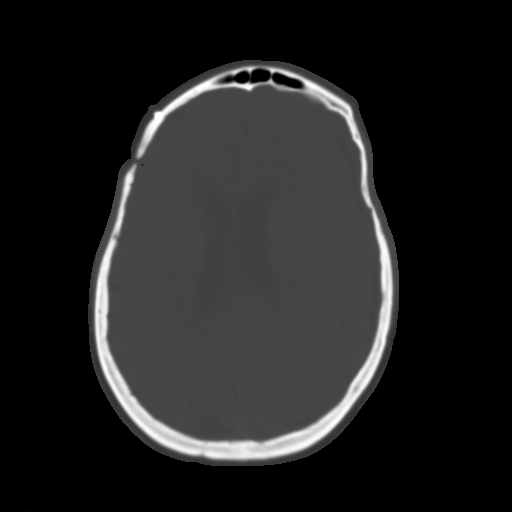
[im 28/37  brain]
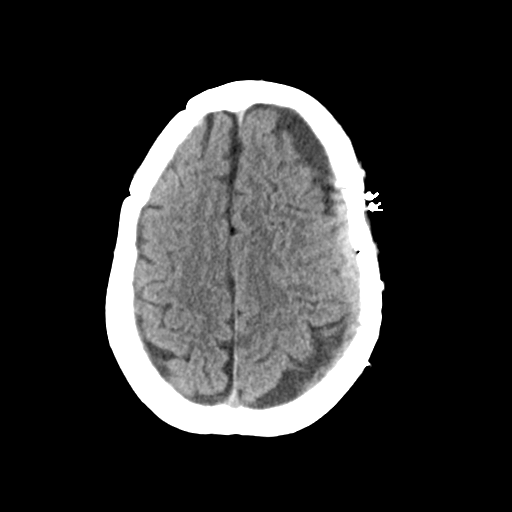
[im 32/37  brain]
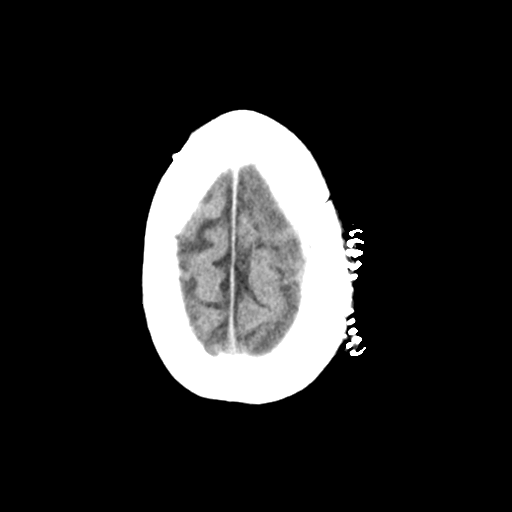

[Series 3: head bone · axial · 0.47mm/px · z∈[+972,+1008]mm · 3 of 91 slices shown]
[im 10/91  bone]
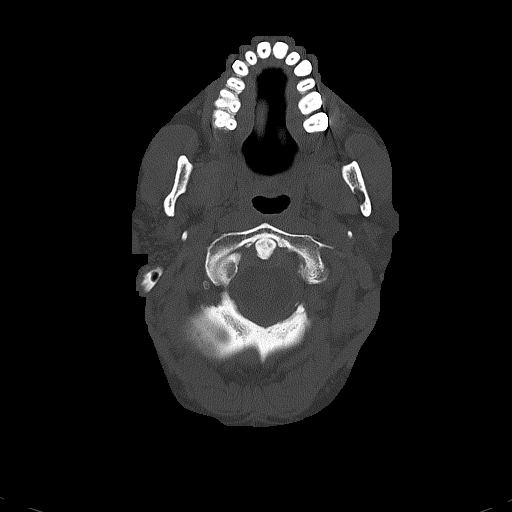
[im 19/91  bone]
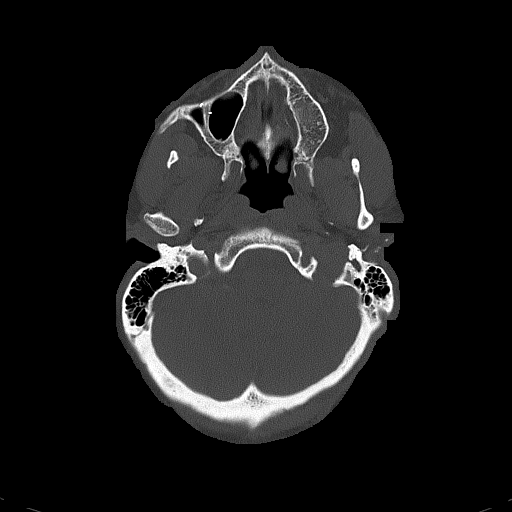
[im 28/91  bone]
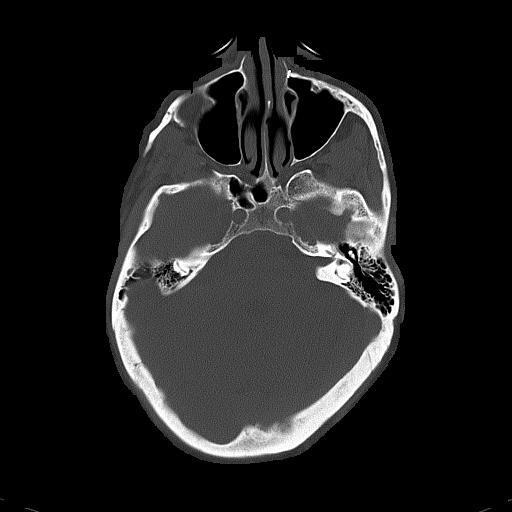

[Series 4: coronal soft tissue · coronal · 0.34mm/px · 3 of 71 slices shown]
[im 24/71  brain]
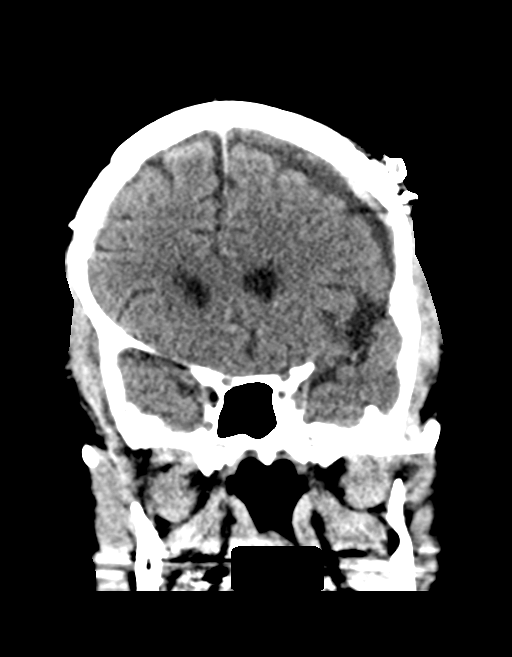
[im 32/71  brain]
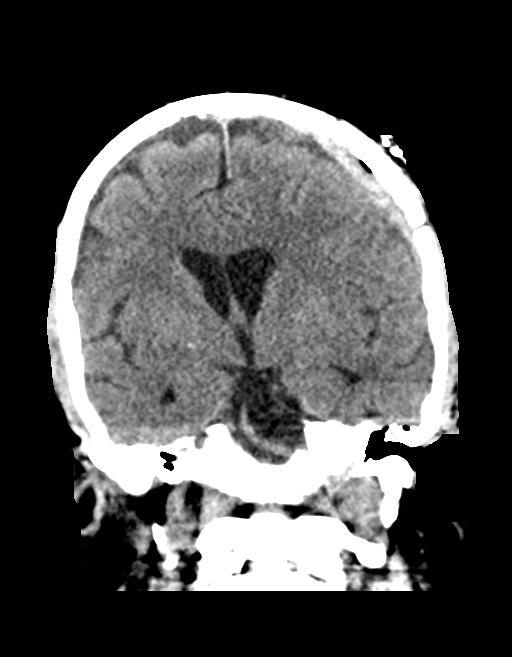
[im 39/71  brain]
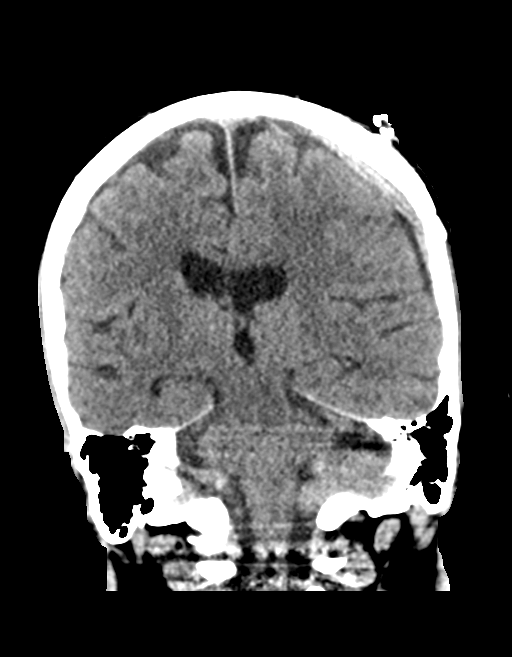

[Series 5: sagittal soft tissue · sagittal · 0.41mm/px · 3 of 67 slices shown]
[im 23/67  brain]
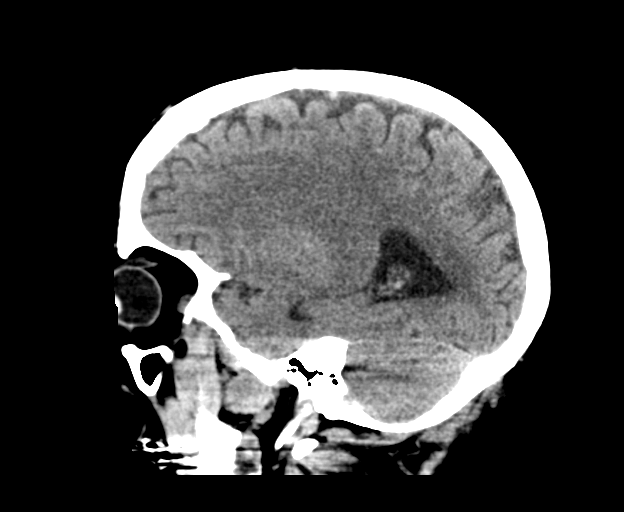
[im 34/67  brain]
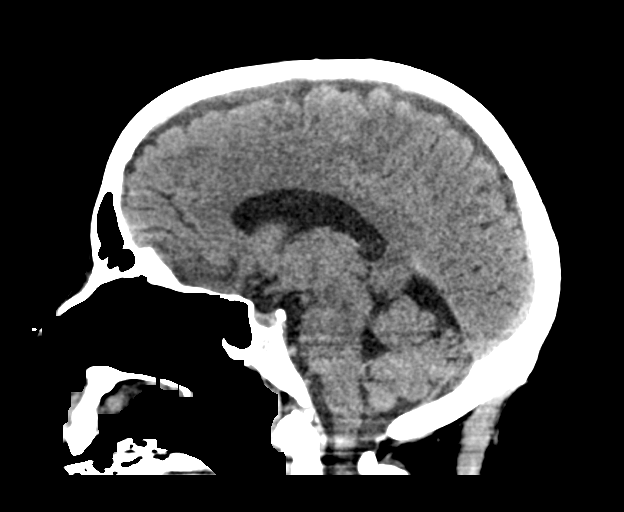
[im 45/67  brain]
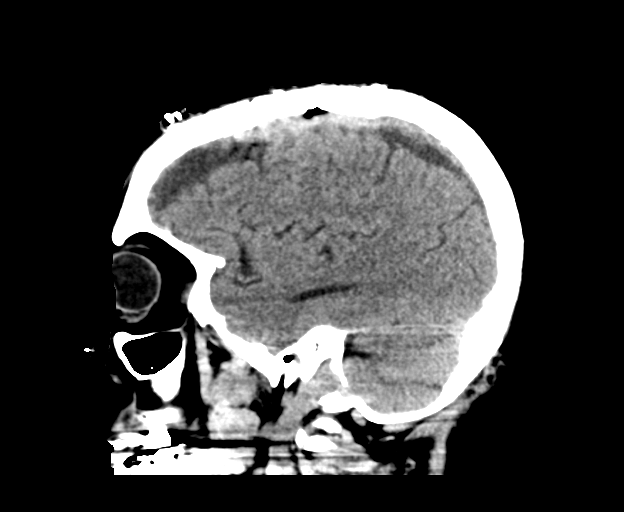

[16 of 47 positions shown; findings below may reference images not displayed]

FINDINGS: Brain: No abnormality affects the brainstem or cerebellum. Right
cerebral hemisphere remains normal. On the left, there is been
removal of the previously seen subdural drain. There is been
previous left frontoparietal craniotomy for subdural evacuation.
Small amount of residual mixed density material remains in the
subdural space on the left, maximal thickness 7 mm. Mild mass effect
with left-to-right shift of 5-6 mm. No evidence of acute rebleeding.
No sign of ischemic infarction. No hydrocephalus.

Vascular: No abnormal vascular finding.

Skull: Old right frontal craniotomy also present.

Sinuses/Orbits: Clear/normal

Other: None
IMPRESSION: Compared to the study of 9 days ago, the left-sided subdural drain
has been removed. There is a small amount of residual mixed density
subdural material on the left, maximal thickness 7 mm, with mass
effect and left-to-right shift of 5-6 mm. There is no evidence of
any hyperacute rebleeding.

## 2021-07-08 ENCOUNTER — Other Ambulatory Visit: Payer: 59

## 2021-07-09 ENCOUNTER — Telehealth: Payer: Self-pay | Admitting: *Deleted

## 2021-07-09 NOTE — Telephone Encounter (Signed)
Transition Care Management Unsuccessful Follow-up Telephone Call  Date of discharge and from where:  [facility]  06/29/2021  Attempts:  1st Attempt  Reason for unsuccessful TCM follow-up call:  No answer/busy   Jacqlyn Larsen [facility], BSN RN Case Manager [phone]

## 2021-07-12 ENCOUNTER — Other Ambulatory Visit: Payer: Self-pay | Admitting: Internal Medicine

## 2021-07-13 NOTE — Chronic Care Management (AMB) (Signed)
  Care Management   Outreach Note  07/13/2021 Name: Christina [name] MRN: [id number] DOB: [birth year]  Referred by: Burnis Medin, MD Reason for referral : Care Coordination (Initial outreach to schedule referral with [facility] )   A second unsuccessful telephone outreach was attempted today. The patient was referred to the case management team for assistance with care management and care coordination.   Follow Up Plan:  A HIPAA compliant phone message was left for the patient providing contact information and requesting a return call.  The care management team will reach out to the patient again over the next 7 days. If patient returns call to provider office, please advise to call [place] at [phone].  [place] Management  Direct Dial: [phone]

## 2021-07-14 ENCOUNTER — Other Ambulatory Visit: Payer: Self-pay | Admitting: Internal Medicine

## 2021-07-14 ENCOUNTER — Other Ambulatory Visit (HOSPITAL_COMMUNITY): Payer: Self-pay

## 2021-07-14 MED ORDER — VERAPAMIL HCL ER 240 MG PO TBCR
EXTENDED_RELEASE_TABLET | Freq: Every day | ORAL | 3 refills | Status: DC
  Filled 2021-07-14: qty 90, 90d supply, fill #0
  Filled 2021-10-10: qty 90, 90d supply, fill #1
  Filled 2022-01-03: qty 90, 90d supply, fill #2
  Filled 2022-03-29: qty 90, 90d supply, fill #3

## 2021-07-15 ENCOUNTER — Ambulatory Visit: Payer: 59 | Admitting: Hematology and Oncology

## 2021-07-16 ENCOUNTER — Telehealth: Payer: Self-pay

## 2021-07-16 NOTE — Telephone Encounter (Signed)
Transition Care Management Unsuccessful Follow-up Telephone Call  Date of discharge and from where:  06/29/2021  [facility]  Attempts:  2nd Attempt  Reason for unsuccessful TCM follow-up call:  Unable to reach patient  Placed call to patient on 07/14/2021. She answered and reported she was at MD office and would call me back. As of 07/16/2021 no return call.  Tomasa Rand, RN, BSN, CEN [facility] [employer] [phone]

## 2021-07-20 ENCOUNTER — Other Ambulatory Visit (HOSPITAL_COMMUNITY): Payer: Self-pay | Admitting: Neurological Surgery

## 2021-07-20 ENCOUNTER — Other Ambulatory Visit: Payer: Self-pay | Admitting: Neurological Surgery

## 2021-07-20 DIAGNOSIS — S065XAA Traumatic subdural hemorrhage with loss of consciousness status unknown, initial encounter: Secondary | ICD-10-CM

## 2021-07-20 NOTE — Chronic Care Management (AMB) (Signed)
°  Chronic Care Management   Outreach Note  07/20/2021 Name: Christina [name] MRN: [id number] DOB: 02/08/1961  Christina [name] is a 60 y.o. year old female who is a primary care patient of Panosh, Standley Brooking, MD. I reached out to Chanda Busing by phone today in response to a referral sent by Ms. Hessie Dibble Morgenthaler's primary care provider.  Third unsuccessful telephone outreach was attempted today. The patient was referred to the case management team for assistance with care management and care coordination. The patient's primary care provider has been notified of our unsuccessful attempts to make or maintain contact with the patient. The care management team is pleased to engage with this patient at any time in the future should he/she be interested in assistance from the care management team.   Follow Up Plan: We have been unable to make contact with the patient. The care management team is available to follow up with the patient after provider conversation with the patient regarding recommendation for care management engagement and subsequent re-referral to the care management team.   [place] Management  Direct Dial: [phone]

## 2021-07-21 ENCOUNTER — Other Ambulatory Visit (HOSPITAL_COMMUNITY): Payer: Self-pay

## 2021-07-22 ENCOUNTER — Ambulatory Visit: Payer: 59 | Admitting: Neurology

## 2021-07-23 ENCOUNTER — Other Ambulatory Visit (HOSPITAL_COMMUNITY): Payer: Self-pay

## 2021-08-11 ENCOUNTER — Inpatient Hospital Stay: Payer: 59 | Attending: Hematology and Oncology

## 2021-08-11 ENCOUNTER — Other Ambulatory Visit: Payer: Self-pay

## 2021-08-11 DIAGNOSIS — C9210 Chronic myeloid leukemia, BCR/ABL-positive, not having achieved remission: Secondary | ICD-10-CM | POA: Insufficient documentation

## 2021-08-11 DIAGNOSIS — Z79899 Other long term (current) drug therapy: Secondary | ICD-10-CM | POA: Diagnosis not present

## 2021-08-11 LAB — COMPREHENSIVE METABOLIC PANEL
ALT: 15 U/L (ref 0–44)
AST: 17 U/L (ref 15–41)
Albumin: 4.3 g/dL (ref 3.5–5.0)
Alkaline Phosphatase: 48 U/L (ref 38–126)
Anion gap: 7 (ref 5–15)
BUN: 24 mg/dL — ABNORMAL HIGH (ref 8–23)
CO2: 25 mmol/L (ref 22–32)
Calcium: 9.5 mg/dL (ref 8.9–10.3)
Chloride: 105 mmol/L (ref 98–111)
Creatinine, Ser: 0.83 mg/dL (ref 0.44–1.00)
GFR, Estimated: >60 mL/min (ref >60)
Glucose, Bld: 95 mg/dL (ref 70–99)
Potassium: 3.9 mmol/L (ref 3.5–5.1)
Sodium: 137 mmol/L (ref 135–145)
Total Bilirubin: 0.4 mg/dL (ref 0.3–1.2)
Total Protein: 7.2 g/dL (ref 6.5–8.1)

## 2021-08-11 LAB — CBC WITH DIFFERENTIAL/PLATELET
Abs Immature Granulocytes: 0.01 10*3/uL (ref 0.00–0.07)
Basophils Absolute: 0.1 10*3/uL (ref 0.0–0.1)
Basophils Relative: 1 %
Eosinophils Absolute: 0.2 10*3/uL (ref 0.0–0.5)
Eosinophils Relative: 5 %
HCT: 37.2 % (ref 36.0–46.0)
Hemoglobin: 12.9 g/dL (ref 12.0–15.0)
Immature Granulocytes: 0 %
Lymphocytes Relative: 29 %
Lymphs Abs: 1.3 10*3/uL (ref 0.7–4.0)
MCH: 31.6 pg (ref 26.0–34.0)
MCHC: 34.7 g/dL (ref 30.0–36.0)
MCV: 91.2 fL (ref 80.0–100.0)
Monocytes Absolute: 0.5 10*3/uL (ref 0.1–1.0)
Monocytes Relative: 11 %
Neutro Abs: 2.4 10*3/uL (ref 1.7–7.7)
Neutrophils Relative %: 54 %
Platelets: 296 10*3/uL (ref 150–400)
RBC: 4.08 MIL/uL (ref 3.87–5.11)
RDW: 13.1 % (ref 11.5–15.5)
WBC: 4.5 10*3/uL (ref 4.0–10.5)
nRBC: 0 % (ref 0.0–0.2)

## 2021-08-12 ENCOUNTER — Ambulatory Visit (HOSPITAL_COMMUNITY)
Admission: RE | Admit: 2021-08-12 | Discharge: 2021-08-12 | Disposition: A | Discharge status: Home / Self Care | Payer: 59 | Source: Ambulatory Visit | Attending: Neurological Surgery | Admitting: Neurological Surgery

## 2021-08-12 DIAGNOSIS — I6200 Nontraumatic subdural hemorrhage, unspecified: Secondary | ICD-10-CM | POA: Diagnosis not present

## 2021-08-12 DIAGNOSIS — S065XAA Traumatic subdural hemorrhage with loss of consciousness status unknown, initial encounter: Secondary | ICD-10-CM | POA: Insufficient documentation

## 2021-08-12 IMAGING — CT CT HEAD W/O CM
4 series · 16 of 47 positions shown, 18 images · non-contrast
Comparison: CT head [DATE]

CLINICAL DATA: Follow-up subdural hematoma

EXAM:
CT HEAD WITHOUT CONTRAST
TECHNIQUE: Contiguous axial images were obtained from the base of the skull
through the vertex without intravenous contrast.

[Series 2: head wo · axial · 0.47mm/px · z∈[-32,+83]mm · 7 of 31 slices shown, 9 images]
[im 4/31  brain]
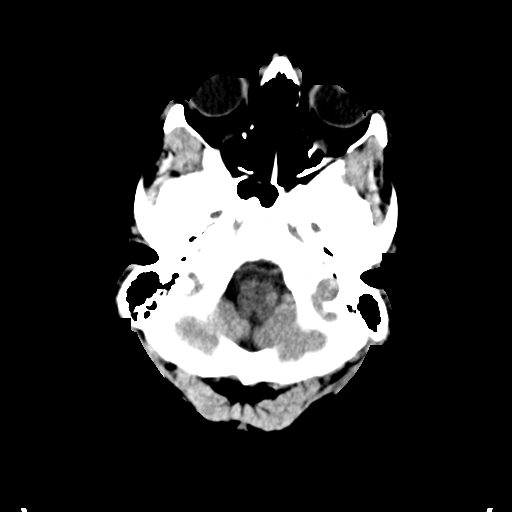
[im 4/31  bone]
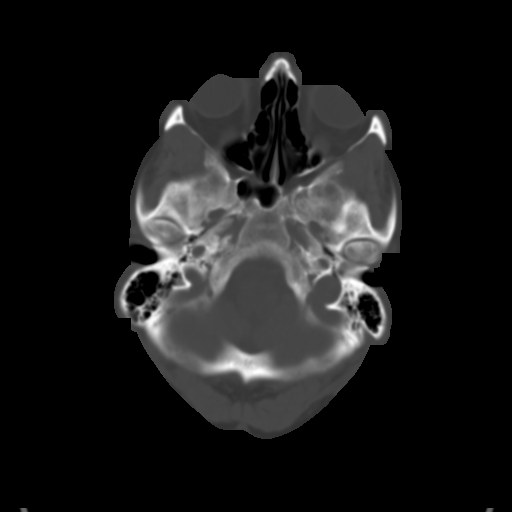
[im 8/31  brain]
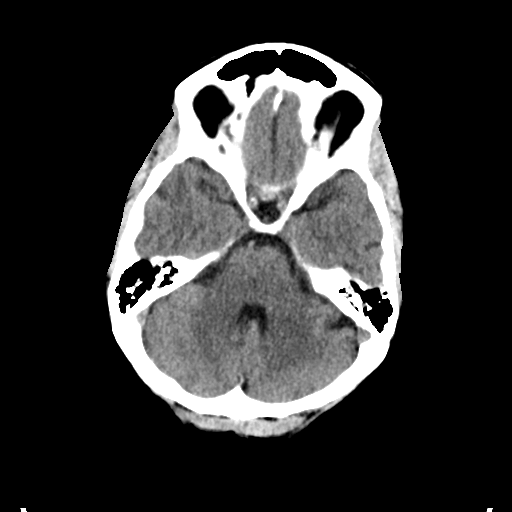
[im 12/31  brain]
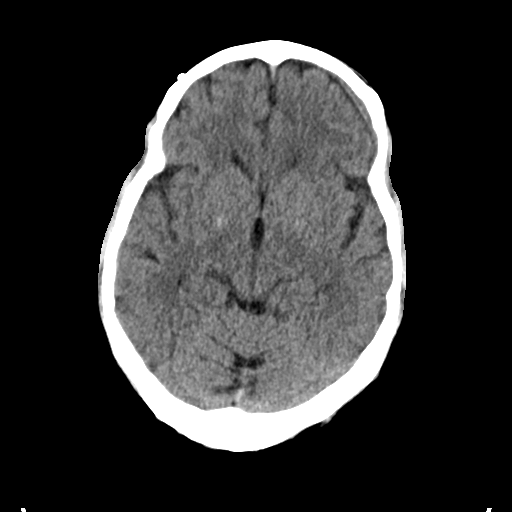
[im 16/31  brain]
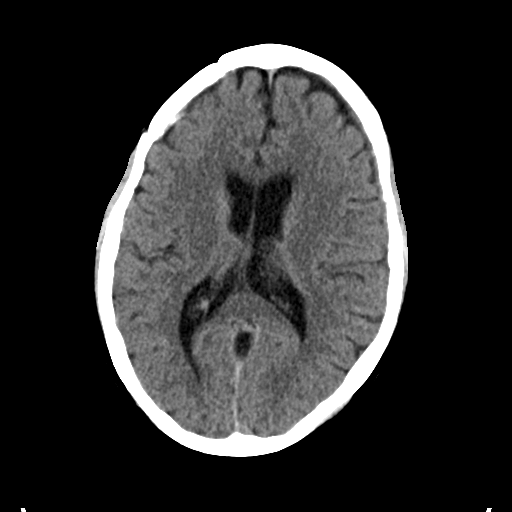
[im 19/31  brain]
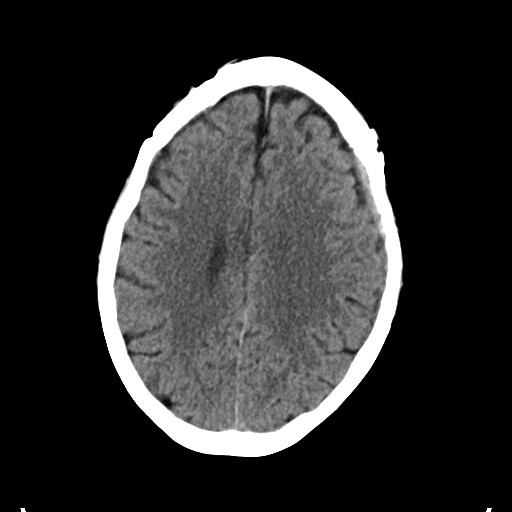
[im 19/31  bone]
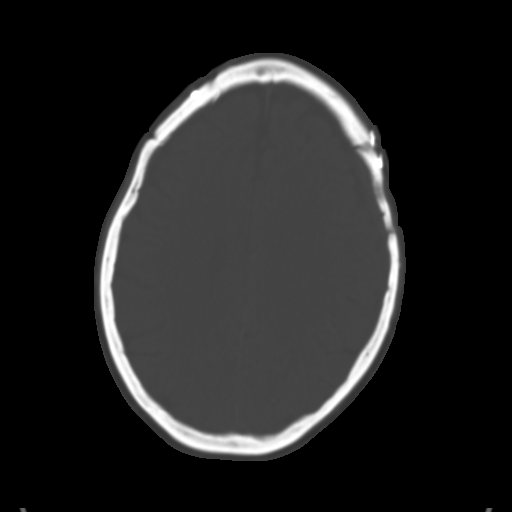
[im 23/31  brain]
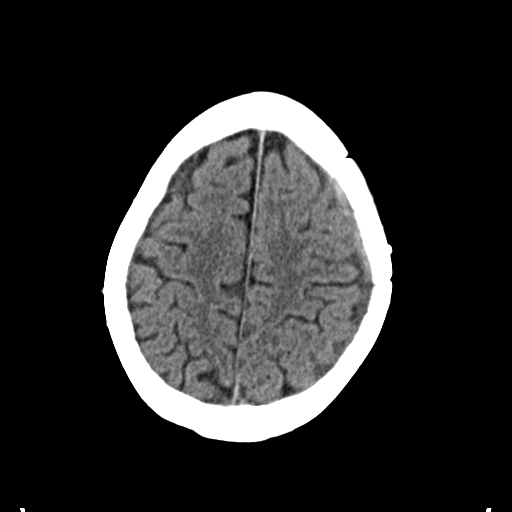
[im 27/31  brain]
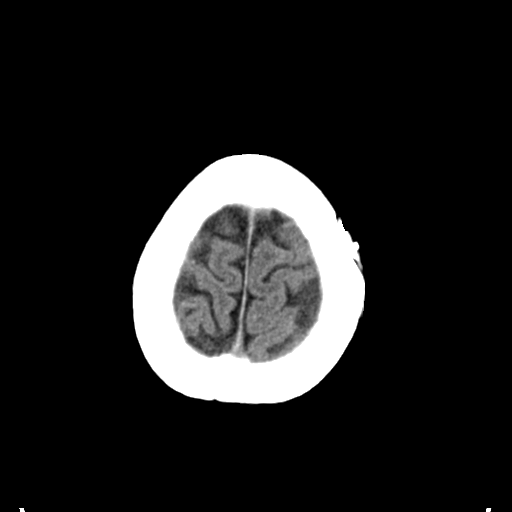

[Series 3: head bone · axial · 0.47mm/px · z∈[-33,-3]mm · 3 of 77 slices shown]
[im 8/77  bone]
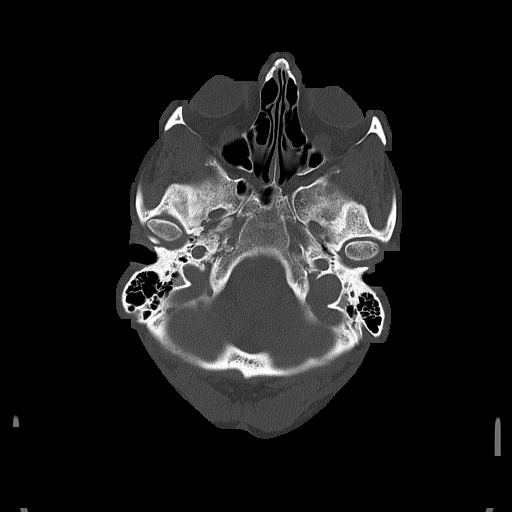
[im 16/77  bone]
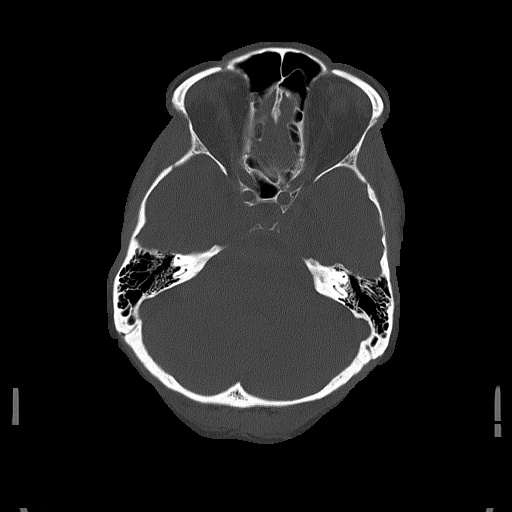
[im 23/77  bone]
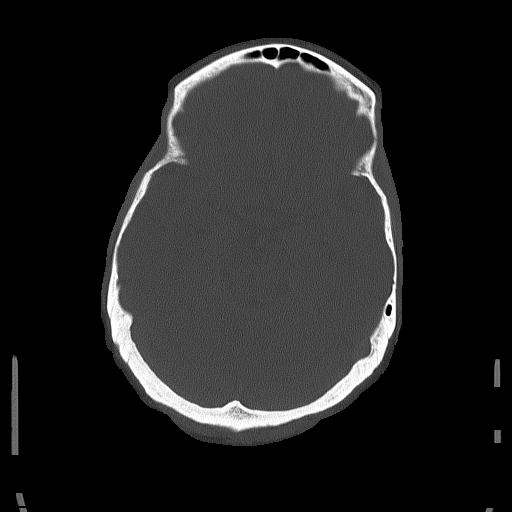

[Series 4: coronal soft tissue · coronal · 0.31mm/px · 3 of 70 slices shown]
[im 24/70  brain]
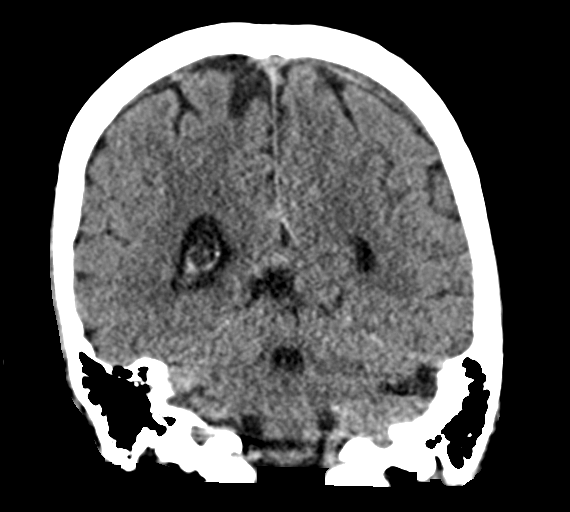
[im 31/70  brain]
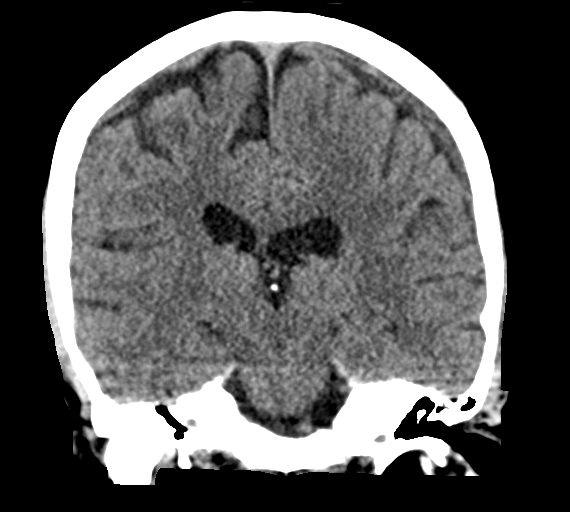
[im 39/70  brain]
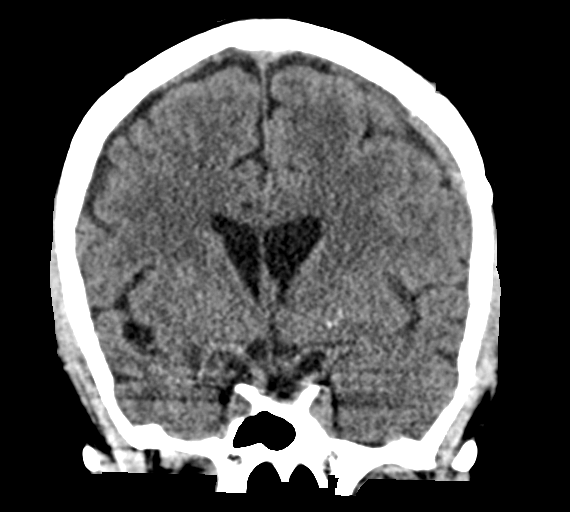

[Series 5: sagittal soft tissue · sagittal · 0.31mm/px · 3 of 60 slices shown]
[im 20/60  brain]
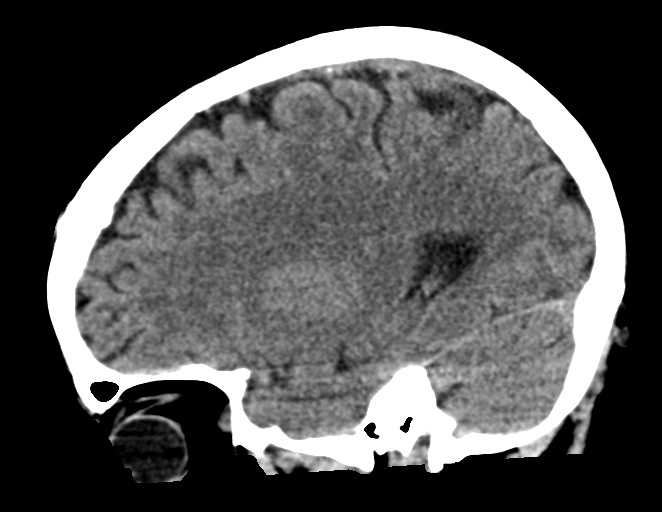
[im 30/60  brain]
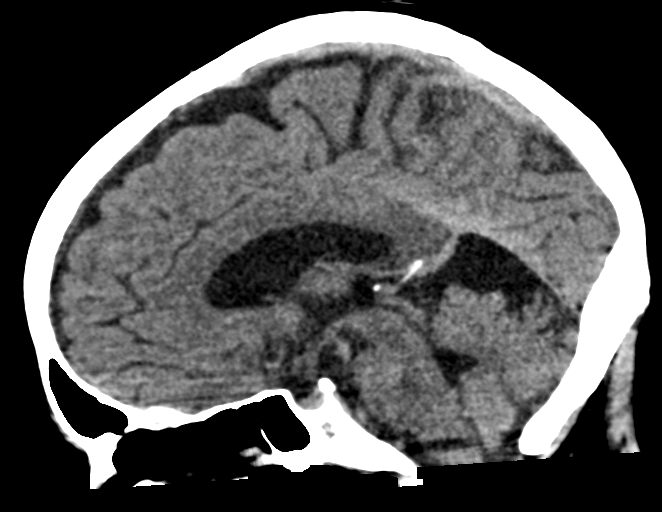
[im 40/60  brain]
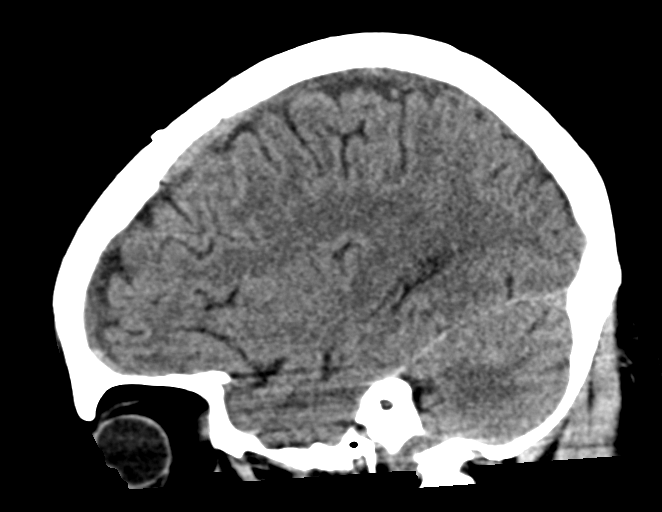

[16 of 47 positions shown; findings below may reference images not displayed]

FINDINGS: Brain: Left-sided subdural hematoma shows interval improvement. The
extra-axial fluid collection is smaller. Subdural gas has resolved.
The subdural fluid collection shows intermediate density. No new
hemorrhage since the prior study. The collection measures
approximately 4 mm in thickness. Interval resolution of mass-effect
and midline shift.

Ventricle size normal.  No acute infarct or mass.

Vascular: Negative for hyperdense vessel

Skull: Bifrontal craniotomy.

Sinuses/Orbits: Paranasal sinuses clear.  Negative orbit

Other: None
IMPRESSION: Interval improvement in small left-sided subdural hematoma. Interval
resolution of mass-effect and midline shift. No new hemorrhage or
infarct.

## 2021-08-19 ENCOUNTER — Other Ambulatory Visit: Payer: Self-pay

## 2021-08-19 ENCOUNTER — Encounter: Payer: Self-pay | Admitting: Hematology and Oncology

## 2021-08-19 ENCOUNTER — Inpatient Hospital Stay (HOSPITAL_BASED_OUTPATIENT_CLINIC_OR_DEPARTMENT_OTHER): Payer: 59 | Admitting: Hematology and Oncology

## 2021-08-19 DIAGNOSIS — Z79899 Other long term (current) drug therapy: Secondary | ICD-10-CM | POA: Diagnosis not present

## 2021-08-19 DIAGNOSIS — C9210 Chronic myeloid leukemia, BCR/ABL-positive, not having achieved remission: Secondary | ICD-10-CM

## 2021-08-19 LAB — BCR/ABL

## 2021-08-19 NOTE — Progress Notes (Signed)
[name] OFF[facility] PROGRESS NOTE  Patient Care Team: Panosh, Standley Brooking, MD as PCP - General [name], MD (Endocrinology) [name], MD (Internal Medicine) Everett Graff, MD as Attending Physician (Obstetrics and Gynecology) Heath Lark, MD as Consulting Physician (Hematology and Oncology) Chesley Mires, MD as Consulting Physician (Pulmonary Disease)  ASSESSMENT & PLAN:  CML (chronic myeloid leukemia) ([place]) She is doing well We have a long discussion regarding the risk of subdural hematoma or any form of intracranial bleeding associated with Sprycel I gave her the current guidelines and reviewed with the patient and her husband about the risk and benefits of discontinuation of Sprycel The patient or her husband will contact me once she has made a final decision about stopping treatment   No orders of the defined types were placed in this encounter.   All questions were answered. The patient knows to call the clinic with any problems, questions or concerns. The total time spent in the appointment was 20 minutes encounter with patients including review of chart and various tests results, discussions about plan of care and coordination of care plan   Heath Lark, MD 08/19/2021 7:25 PM  INTERVAL HISTORY: Please see below for problem oriented charting. she returns for treatment follow-up on Sprycel for CML She is recovering well from recent intracranial surgery She has no permanent neurological deficits I spoke with her and her husband in length about the risk and benefits of discontinuation of Sprycel  REVIEW OF SYSTEMS:   Constitutional: Denies fevers, chills or abnormal weight loss Eyes: Denies blurriness of vision Ears, nose, mouth, throat, and face: Denies mucositis or sore throat Respiratory: Denies cough, dyspnea or wheezes Cardiovascular: Denies palpitation, chest discomfort or lower extremity swelling Gastrointestinal:  Denies nausea, heartburn or  change in bowel habits Skin: Denies abnormal skin rashes Lymphatics: Denies new lymphadenopathy or easy bruising Neurological:Denies numbness, tingling or new weaknesses Behavioral/Psych: Mood is stable, no new changes  All other systems were reviewed with the patient and are negative.  I have reviewed the past medical history, past surgical history, social history and family history with the patient and they are unchanged from previous note.  ALLERGIES:  has No Known Allergies.  MEDICATIONS:  Current Outpatient Medications  Medication Sig Dispense Refill   acetaminophen (TYLENOL) 325 MG tablet Take 650 mg by mouth every 6 (six) hours as needed for moderate pain or headache.     atorvastatin (LIPITOR) 20 MG tablet Take 1 tablet (20 mg total) by mouth daily. 30 tablet 1   dasatinib (SPRYCEL) 50 MG tablet Take 50 mg by mouth daily.     HYDROcodone-acetaminophen (NORCO/VICODIN) 5-325 MG tablet Take 1 tablet by mouth every 4 (four) hours as needed for moderate pain. 30 tablet 0   levETIRAcetam (KEPPRA) 500 MG tablet Take 1 tablet (500 mg total) by mouth every 12 (twelve) hours. 30 tablet 0   levETIRAcetam (KEPPRA) 500 MG tablet Take 1 tablet (500 mg total) by mouth every 12 (twelve) hours. 60 tablet 0   losartan (COZAAR) 50 MG tablet Take 1 tablet (50 mg total) by mouth daily. 90 tablet 2   metroNIDAZOLE (METROCREAM) 0.75 % cream Apply 1 application topically daily. (Patient taking differently: Apply 1 application topically daily as needed (dry skin).) 45 g 3   nortriptyline (PAMELOR) 25 MG capsule Take 1 capsule (25 mg total) by mouth at bedtime. (Patient not taking: Reported on 06/27/2021) 30 capsule 3   SYNTHROID 175 MCG tablet TAKE 1 TABLET (175 MCG TOTAL) BY  MOUTH DAILY BEFORE BREAKFAST. (Patient taking differently: Take 175 mcg by mouth daily.) 90 tablet 2   verapamil (CALAN-SR) 240 MG CR tablet TAKE 1 TABLET BY MOUTH AT BEDTIME. 90 tablet 3   zolpidem (AMBIEN) 10 MG tablet TAKE 1 TABLET  BY MOUTH AT BEDTIME AS NEEDED FOR SLEEP (Patient taking differently: Take 10 mg by mouth at bedtime.) 90 tablet 0   No current facility-administered medications for this visit.    SUMMARY OF ONCOLOGIC HISTORY: Oncology History  CML (chronic myeloid leukemia) ([place])  05/15/2015 Bone Marrow Biopsy   BM biopsy and FISH confirmed CML [id number]   05/15/2015 - 05/20/2015 Chemotherapy   She started taking Hydrea 1500 mg daily   05/15/2015 Tumor Marker   BCR/ABL b2a2 87.5%, IS 49%   05/21/2015 -  Chemotherapy   She started taking Sprycel 100 mg daily   06/29/2015 Adverse Reaction   She had diarrhea. Dose of Srycel is reduced to 50 mg daily   09/10/2015 Tumor Marker   BCR/ABL b2a2 0.21%, IS 0.1743%   12/11/2015 Pathology Results   BCR/ABL b2a2 0.19%, IS 0.1577%   04/13/2016 Pathology Results   BCR/ABL b2a2 0.29%, IS 0.2407%   07/14/2016 Pathology Results   BCR/ABL b2a2 0.004%, IS 0.0092%. She is in MMR   07/21/2016 Miscellaneous   Dose of Sprycel is reduced to 50 mg daily   09/08/2016 Pathology Results   BCR/ABL b2a2 0.001%, IS 0.0023%. She is in MMR   06/14/2017 Pathology Results   BCR/ABL not detected. She is in MMR   10/11/2017 Pathology Results   BCR/ABL is not detected. She is in MMR   02/14/2018 Pathology Results   BCR/ABL is not detected. She is in MMR   05/17/2018 Pathology Results   BCR/ABL is not detected. She is in MMR   08/22/2018 Pathology Results   BCR/ABL is not detected. She is in MMR   11/30/2018 Pathology Results   BCR/ABL is not detected. She is in MMR   06/03/2019 Pathology Results   BCR/ABL is not detected. She is in MMR   09/03/2019 Pathology Results   BCR/ABL is not detected. She is in MMR   12/05/2019 Pathology Results   BCR/ABL is not detected. She is in MMR   06/01/2020 Pathology Results   BCR/ABL is not detected. She is in MMR   09/01/2020 Pathology Results   BCR/ABL is not detected. She is in MMR   12/21/2020 Pathology Results   BCR/ABL is not  detected. She is in MMR   04/06/2021 Pathology Results   BCR/ABL is not detected. She is in MMR   08/11/2021 Pathology Results   BCR/ABL not detected. She is in MMR     PHYSICAL EXAMINATION: ECOG PERFORMANCE STATUS: 1 - Symptomatic but completely ambulatory  Vitals:   08/19/21 0816  BP: 127/65  Pulse: 65  Resp: 18  Temp: 97.7 F (36.5 C)  SpO2: 100%   Filed Weights   08/19/21 0816  Weight: 168 lb 8 oz (76.4 kg)    GENERAL:alert, no distress and comfortable NEURO: alert & oriented x 3 with fluent speech, no focal motor/sensory deficits  LABORATORY DATA:  I have reviewed the data as listed    Component Value Date/Time   NA 137 08/11/2021 0827   NA 138 02/07/2017 0836   K 3.9 08/11/2021 0827   K 4.2 02/07/2017 0836   CL 105 08/11/2021 0827   CO2 25 08/11/2021 0827   CO2 23 02/07/2017 0836   GLUCOSE 95 08/11/2021 0827  GLUCOSE 85 02/07/2017 0836   BUN 24 (H) 08/11/2021 0827   BUN 15.5 02/07/2017 0836   CREATININE 0.83 08/11/2021 0827   CREATININE 0.9 02/07/2017 0836   CALCIUM 9.5 08/11/2021 0827   CALCIUM 9.5 02/07/2017 0836   PROT 7.2 08/11/2021 0827   PROT 7.0 02/07/2017 0836   ALBUMIN 4.3 08/11/2021 0827   ALBUMIN 3.8 02/07/2017 0836   AST 17 08/11/2021 0827   AST 25 02/07/2017 0836   ALT 15 08/11/2021 0827   ALT 36 02/07/2017 0836   ALKPHOS 48 08/11/2021 0827   ALKPHOS 55 02/07/2017 0836   BILITOT 0.4 08/11/2021 0827   BILITOT 0.31 02/07/2017 0836   GFRNONAA >60 08/11/2021 0827   GFRAA >60 03/03/2020 0747    No results found for: SPEP, UPEP  Lab Results  Component Value Date   WBC 4.5 08/11/2021   NEUTROABS 2.4 08/11/2021   HGB 12.9 08/11/2021   HCT 37.2 08/11/2021   MCV 91.2 08/11/2021   PLT 296 08/11/2021      Chemistry      Component Value Date/Time   NA 137 08/11/2021 0827   NA 138 02/07/2017 0836   K 3.9 08/11/2021 0827   K 4.2 02/07/2017 0836   CL 105 08/11/2021 0827   CO2 25 08/11/2021 0827   CO2 23 02/07/2017 0836   BUN 24  (H) 08/11/2021 0827   BUN 15.5 02/07/2017 0836   CREATININE 0.83 08/11/2021 0827   CREATININE 0.9 02/07/2017 0836      Component Value Date/Time   CALCIUM 9.5 08/11/2021 0827   CALCIUM 9.5 02/07/2017 0836   ALKPHOS 48 08/11/2021 0827   ALKPHOS 55 02/07/2017 0836   AST 17 08/11/2021 0827   AST 25 02/07/2017 0836   ALT 15 08/11/2021 0827   ALT 36 02/07/2017 0836   BILITOT 0.4 08/11/2021 0827   BILITOT 0.31 02/07/2017 0836       RADIOGRAPHIC STUDIES: I have personally reviewed the radiological images as listed and agreed with the findings in the report. CT HEAD WO CONTRAST (5MM)  Result Date: 08/12/2021 CLINICAL DATA:  Follow-up subdural hematoma EXAM: CT HEAD WITHOUT CONTRAST TECHNIQUE: Contiguous axial images were obtained from the base of the skull through the vertex without intravenous contrast. COMPARISON:  CT head 07/07/2021 FINDINGS: Brain: Left-sided subdural hematoma shows interval improvement. The extra-axial fluid collection is smaller. Subdural gas has resolved. The subdural fluid collection shows intermediate density. No new hemorrhage since the prior study. The collection measures approximately 4 mm in thickness. Interval resolution of mass-effect and midline shift. Ventricle size normal.  No acute infarct or mass. Vascular: Negative for hyperdense vessel Skull: Bifrontal craniotomy. Sinuses/Orbits: Paranasal sinuses clear.  Negative orbit Other: None IMPRESSION: Interval improvement in small left-sided subdural hematoma. Interval resolution of mass-effect and midline shift. No new hemorrhage or infarct. Electronically Signed   By: Franchot Gallo M.D.   On: 08/12/2021 15:57

## 2021-08-19 NOTE — Assessment & Plan Note (Addendum)
She is doing well We have a long discussion regarding the risk of subdural hematoma or any form of intracranial bleeding associated with Sprycel I gave her the current guidelines and reviewed with the patient and her husband about the risk and benefits of discontinuation of Sprycel The patient or her husband will contact me once she has made a final decision about stopping treatment

## 2021-08-23 ENCOUNTER — Other Ambulatory Visit (HOSPITAL_COMMUNITY): Payer: Self-pay

## 2021-08-23 ENCOUNTER — Telehealth: Payer: Self-pay | Admitting: Hematology and Oncology

## 2021-08-23 NOTE — Telephone Encounter (Signed)
Scheduled appointment per 1/13 scheduling message. Left message.

## 2021-08-25 ENCOUNTER — Telehealth: Payer: Self-pay | Admitting: Hematology and Oncology

## 2021-08-25 NOTE — Telephone Encounter (Signed)
Sch per pt req per 1/18 inbaslet, left msg for pt.

## 2021-08-29 ENCOUNTER — Other Ambulatory Visit: Payer: Self-pay | Admitting: Internal Medicine

## 2021-09-01 ENCOUNTER — Other Ambulatory Visit (HOSPITAL_COMMUNITY): Payer: Self-pay

## 2021-09-01 ENCOUNTER — Other Ambulatory Visit: Payer: Self-pay | Admitting: Internal Medicine

## 2021-09-02 ENCOUNTER — Other Ambulatory Visit (HOSPITAL_COMMUNITY): Payer: Self-pay

## 2021-09-02 MED ORDER — ZOLPIDEM TARTRATE 10 MG PO TABS
ORAL_TABLET | Freq: Every evening | ORAL | 0 refills | Status: DC | PRN
  Filled 2021-09-02: qty 90, 90d supply, fill #0

## 2021-09-02 MED ORDER — SYNTHROID 175 MCG PO TABS
175.0000 ug | ORAL_TABLET | Freq: Every day | ORAL | 0 refills | Status: DC
Start: 2021-09-02 — End: 2021-11-30
  Filled 2021-09-02: qty 90, 90d supply, fill #0

## 2021-09-03 DIAGNOSIS — M180 Bilateral primary osteoarthritis of first carpometacarpal joints: Secondary | ICD-10-CM | POA: Diagnosis not present

## 2021-09-14 ENCOUNTER — Other Ambulatory Visit (HOSPITAL_COMMUNITY): Payer: Self-pay

## 2021-09-14 DIAGNOSIS — L821 Other seborrheic keratosis: Secondary | ICD-10-CM | POA: Diagnosis not present

## 2021-09-14 DIAGNOSIS — L578 Other skin changes due to chronic exposure to nonionizing radiation: Secondary | ICD-10-CM | POA: Diagnosis not present

## 2021-09-14 DIAGNOSIS — Z23 Encounter for immunization: Secondary | ICD-10-CM | POA: Diagnosis not present

## 2021-09-14 DIAGNOSIS — Z411 Encounter for cosmetic surgery: Secondary | ICD-10-CM | POA: Diagnosis not present

## 2021-09-14 DIAGNOSIS — L814 Other melanin hyperpigmentation: Secondary | ICD-10-CM | POA: Diagnosis not present

## 2021-09-14 MED ORDER — TRETINOIN 0.025 % EX CREA
1.0000 | TOPICAL_CREAM | Freq: Every evening | CUTANEOUS | 3 refills | Status: DC
Start: 2021-09-14 — End: 2022-02-21
  Filled 2021-09-14 – 2021-09-20 (×2): qty 45, 30d supply, fill #0

## 2021-09-17 ENCOUNTER — Emergency Department (HOSPITAL_COMMUNITY): Payer: 59

## 2021-09-17 ENCOUNTER — Other Ambulatory Visit: Payer: 59

## 2021-09-17 ENCOUNTER — Encounter (HOSPITAL_BASED_OUTPATIENT_CLINIC_OR_DEPARTMENT_OTHER): Payer: Self-pay | Admitting: Obstetrics and Gynecology

## 2021-09-17 ENCOUNTER — Emergency Department (HOSPITAL_BASED_OUTPATIENT_CLINIC_OR_DEPARTMENT_OTHER): Payer: 59

## 2021-09-17 ENCOUNTER — Other Ambulatory Visit: Payer: Self-pay

## 2021-09-17 ENCOUNTER — Emergency Department (HOSPITAL_BASED_OUTPATIENT_CLINIC_OR_DEPARTMENT_OTHER)
Admission: EM | Admit: 2021-09-17 | Discharge: 2021-09-17 | Disposition: A | Discharge status: Home / Self Care | Payer: 59 | Attending: Emergency Medicine | Admitting: Emergency Medicine

## 2021-09-17 ENCOUNTER — Inpatient Hospital Stay: Payer: 59 | Attending: Hematology and Oncology

## 2021-09-17 DIAGNOSIS — R29810 Facial weakness: Secondary | ICD-10-CM | POA: Diagnosis not present

## 2021-09-17 DIAGNOSIS — R29818 Other symptoms and signs involving the nervous system: Secondary | ICD-10-CM | POA: Diagnosis not present

## 2021-09-17 DIAGNOSIS — R4701 Aphasia: Secondary | ICD-10-CM | POA: Diagnosis not present

## 2021-09-17 DIAGNOSIS — R42 Dizziness and giddiness: Secondary | ICD-10-CM | POA: Diagnosis not present

## 2021-09-17 DIAGNOSIS — M629 Disorder of muscle, unspecified: Secondary | ICD-10-CM | POA: Diagnosis not present

## 2021-09-17 DIAGNOSIS — C9210 Chronic myeloid leukemia, BCR/ABL-positive, not having achieved remission: Secondary | ICD-10-CM | POA: Diagnosis not present

## 2021-09-17 DIAGNOSIS — S065XAA Traumatic subdural hemorrhage with loss of consciousness status unknown, initial encounter: Secondary | ICD-10-CM

## 2021-09-17 DIAGNOSIS — G928 Other toxic encephalopathy: Secondary | ICD-10-CM | POA: Diagnosis not present

## 2021-09-17 DIAGNOSIS — I6203 Nontraumatic chronic subdural hemorrhage: Secondary | ICD-10-CM | POA: Diagnosis not present

## 2021-09-17 DIAGNOSIS — R4781 Slurred speech: Secondary | ICD-10-CM | POA: Diagnosis not present

## 2021-09-17 LAB — DIFFERENTIAL
Abs Immature Granulocytes: 0.01 10*3/uL (ref 0.00–0.07)
Basophils Absolute: 0.1 10*3/uL (ref 0.0–0.1)
Basophils Relative: 1 %
Eosinophils Absolute: 0.2 10*3/uL (ref 0.0–0.5)
Eosinophils Relative: 4 %
Immature Granulocytes: 0 %
Lymphocytes Relative: 22 %
Lymphs Abs: 1.1 10*3/uL (ref 0.7–4.0)
Monocytes Absolute: 0.6 10*3/uL (ref 0.1–1.0)
Monocytes Relative: 11 %
Neutro Abs: 3 10*3/uL (ref 1.7–7.7)
Neutrophils Relative %: 62 %

## 2021-09-17 LAB — CBC WITH DIFFERENTIAL/PLATELET
Abs Immature Granulocytes: 0.01 10*3/uL (ref 0.00–0.07)
Basophils Absolute: 0.1 10*3/uL (ref 0.0–0.1)
Basophils Relative: 2 %
Eosinophils Absolute: 0.3 10*3/uL (ref 0.0–0.5)
Eosinophils Relative: 7 %
HCT: 39.9 % (ref 36.0–46.0)
Hemoglobin: 13.6 g/dL (ref 12.0–15.0)
Immature Granulocytes: 0 %
Lymphocytes Relative: 23 %
Lymphs Abs: 1.1 10*3/uL (ref 0.7–4.0)
MCH: 30.5 pg (ref 26.0–34.0)
MCHC: 34.1 g/dL (ref 30.0–36.0)
MCV: 89.5 fL (ref 80.0–100.0)
Monocytes Absolute: 0.5 10*3/uL (ref 0.1–1.0)
Monocytes Relative: 11 %
Neutro Abs: 2.6 10*3/uL (ref 1.7–7.7)
Neutrophils Relative %: 57 %
Platelets: 252 10*3/uL (ref 150–400)
RBC: 4.46 MIL/uL (ref 3.87–5.11)
RDW: 12.5 % (ref 11.5–15.5)
WBC: 4.6 10*3/uL (ref 4.0–10.5)
nRBC: 0 % (ref 0.0–0.2)

## 2021-09-17 LAB — BASIC METABOLIC PANEL
Anion gap: 9 (ref 5–15)
BUN: 22 mg/dL (ref 8–23)
CO2: 26 mmol/L (ref 22–32)
Calcium: 10 mg/dL (ref 8.9–10.3)
Chloride: 103 mmol/L (ref 98–111)
Creatinine, Ser: 0.71 mg/dL (ref 0.44–1.00)
GFR, Estimated: >60 mL/min (ref >60)
Glucose, Bld: 96 mg/dL (ref 70–99)
Potassium: 3.7 mmol/L (ref 3.5–5.1)
Sodium: 138 mmol/L (ref 135–145)

## 2021-09-17 LAB — CBC
HCT: 39.1 % (ref 36.0–46.0)
Hemoglobin: 13 g/dL (ref 12.0–15.0)
MCH: 30 pg (ref 26.0–34.0)
MCHC: 33.2 g/dL (ref 30.0–36.0)
MCV: 90.1 fL (ref 80.0–100.0)
Platelets: 250 10*3/uL (ref 150–400)
RBC: 4.34 MIL/uL (ref 3.87–5.11)
RDW: 12.7 % (ref 11.5–15.5)
WBC: 4.9 10*3/uL (ref 4.0–10.5)
nRBC: 0 % (ref 0.0–0.2)

## 2021-09-17 LAB — URINALYSIS, ROUTINE W REFLEX MICROSCOPIC
Bilirubin Urine: NEGATIVE
Glucose, UA: NEGATIVE mg/dL
Hgb urine dipstick: NEGATIVE
Ketones, ur: NEGATIVE mg/dL
Leukocytes,Ua: NEGATIVE
Nitrite: NEGATIVE
Protein, ur: NEGATIVE mg/dL
Specific Gravity, Urine: 1.024 (ref 1.005–1.030)
pH: 7.5 (ref 5.0–8.0)

## 2021-09-17 LAB — COMPREHENSIVE METABOLIC PANEL
ALT: 13 U/L (ref 0–44)
AST: 14 U/L — ABNORMAL LOW (ref 15–41)
Albumin: 4.3 g/dL (ref 3.5–5.0)
Alkaline Phosphatase: 49 U/L (ref 38–126)
Anion gap: 8 (ref 5–15)
BUN: 21 mg/dL (ref 8–23)
CO2: 25 mmol/L (ref 22–32)
Calcium: 9.8 mg/dL (ref 8.9–10.3)
Chloride: 105 mmol/L (ref 98–111)
Creatinine, Ser: 0.79 mg/dL (ref 0.44–1.00)
GFR, Estimated: >60 mL/min (ref >60)
Glucose, Bld: 102 mg/dL — ABNORMAL HIGH (ref 70–99)
Potassium: 3.8 mmol/L (ref 3.5–5.1)
Sodium: 138 mmol/L (ref 135–145)
Total Bilirubin: 0.4 mg/dL (ref 0.3–1.2)
Total Protein: 7 g/dL (ref 6.5–8.1)

## 2021-09-17 LAB — PROTIME-INR
INR: 0.9 (ref 0.8–1.2)
Prothrombin Time: 12.3 seconds (ref 11.4–15.2)

## 2021-09-17 LAB — APTT: aPTT: 31 seconds (ref 24–36)

## 2021-09-17 LAB — CBG MONITORING, ED: Glucose-Capillary: 99 mg/dL (ref 70–99)

## 2021-09-17 IMAGING — CT CT HEAD CODE STROKE
2 of 4 series · 13 of 47 positions shown, 16 images · non-contrast
Comparison: None.
COMPARISON: None.

Addendum:
CLINICAL DATA: Neuro deficit, acute, stroke suspected

EXAM:
CT HEAD WITHOUT CONTRAST
CT ANGIOGRAPHY OF THE HEAD AND NECK
TECHNIQUE: Contiguous axial images were obtained from the base of the skull
through the vertex without intravenous contrast. Multidetector CT
imaging of the head and neck was performed using the standard
protocol during bolus administration of intravenous contrast.
Multiplanar CT image reconstructions and MIPs were obtained to
evaluate the vascular anatomy. Carotid stenosis measurements (when
applicable) are obtained utilizing NASCET criteria, using the distal
internal carotid diameter as the denominator.
RADIATION DOSE REDUCTION: This exam was performed according to the
departmental dose-optimization program which includes automated
exposure control, adjustment of the mA and/or kV according to
patient size and/or use of iterative reconstruction technique.

[Series 3: head wo · axial · 0.46mm/px · z∈[+1181,+1316]mm · 10 of 33 slices shown, 13 images]
[im 3/33  brain]
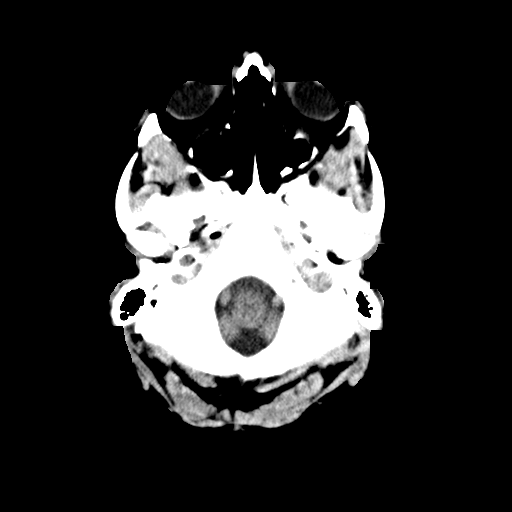
[im 3/33  bone]
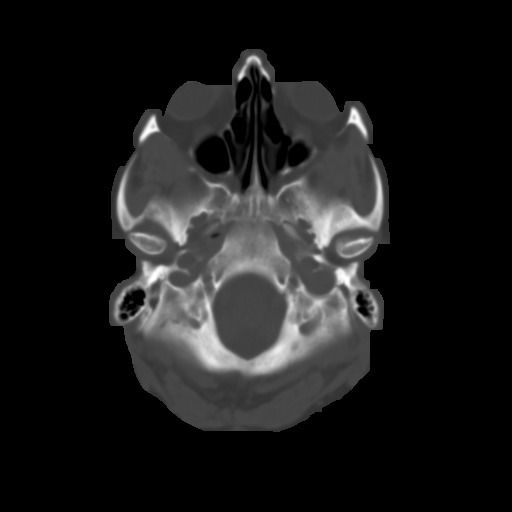
[im 5/33  brain]
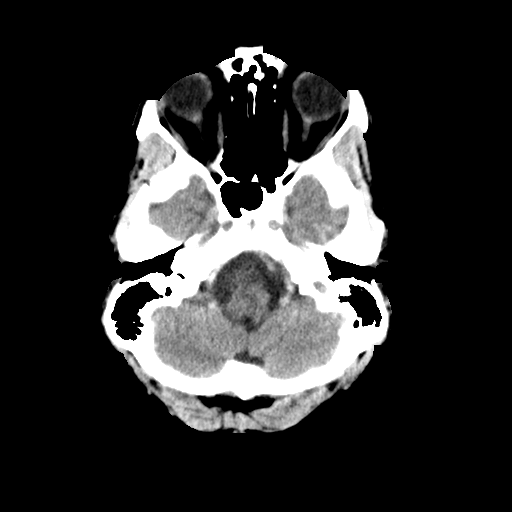
[im 10/33  brain]
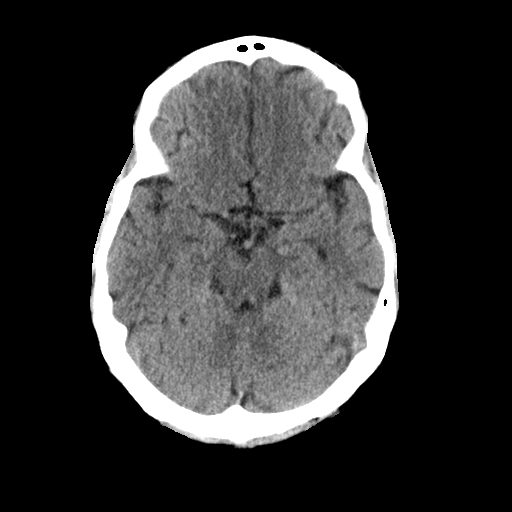
[im 12/33  brain]
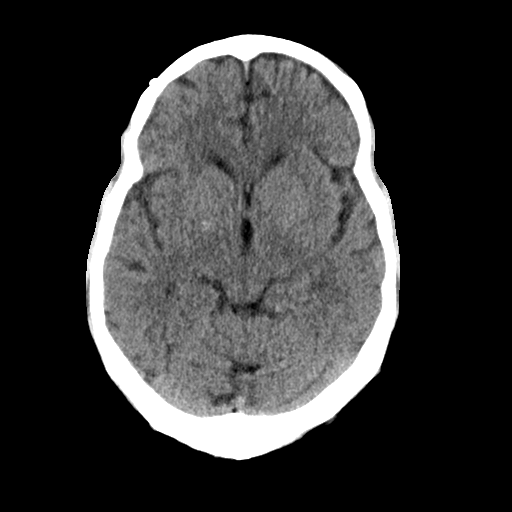
[im 14/33  brain]
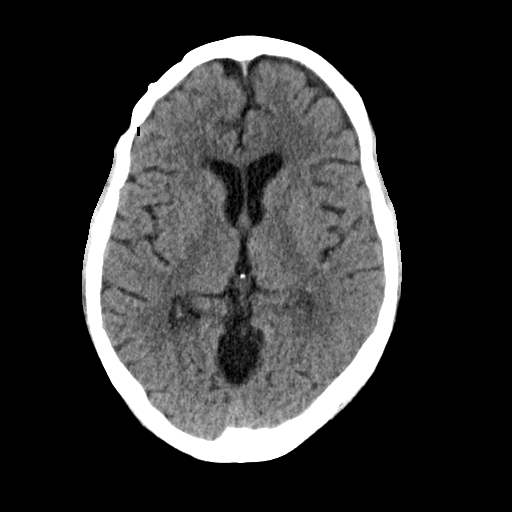
[im 14/33  bone]
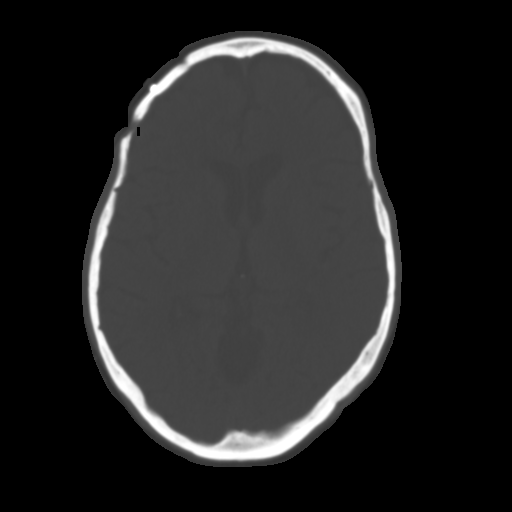
[im 19/33  brain]
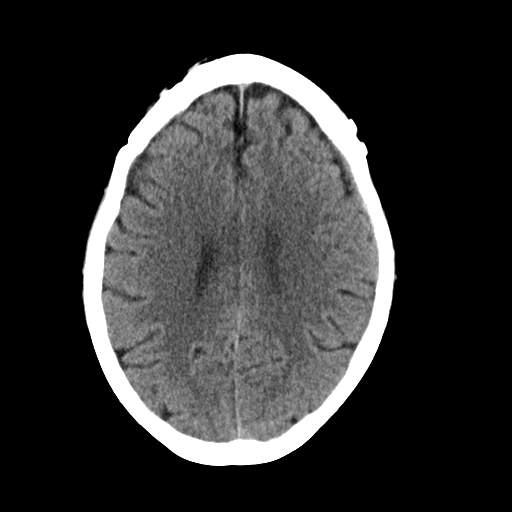
[im 21/33  brain]
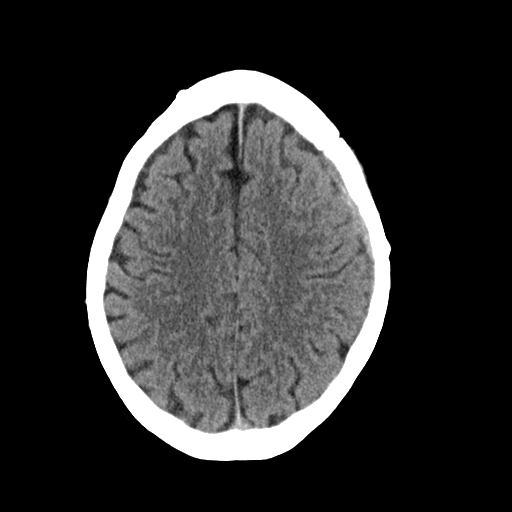
[im 23/33  brain]
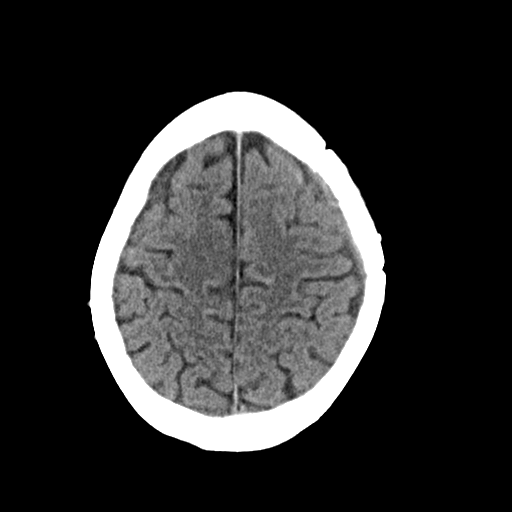
[im 28/33  brain]
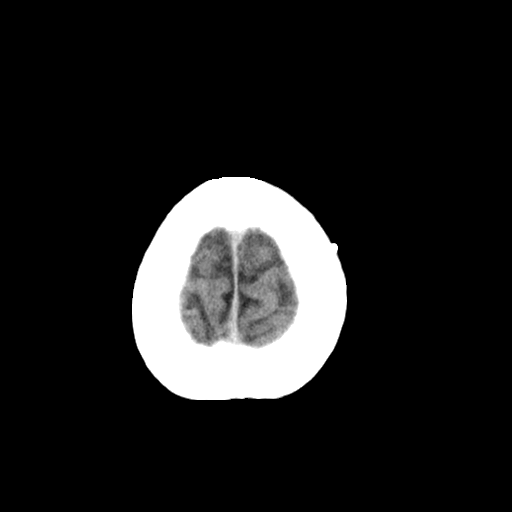
[im 28/33  bone]
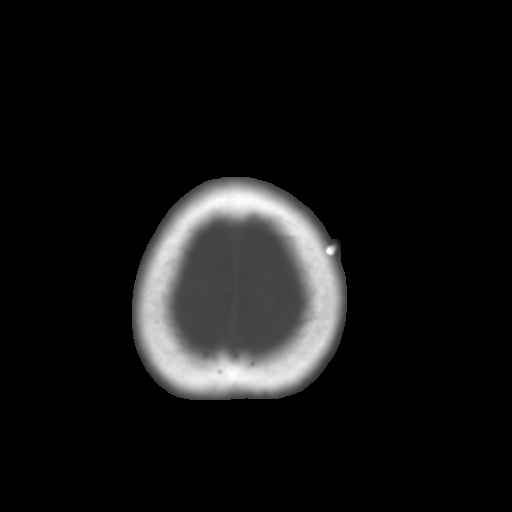
[im 30/33  brain]
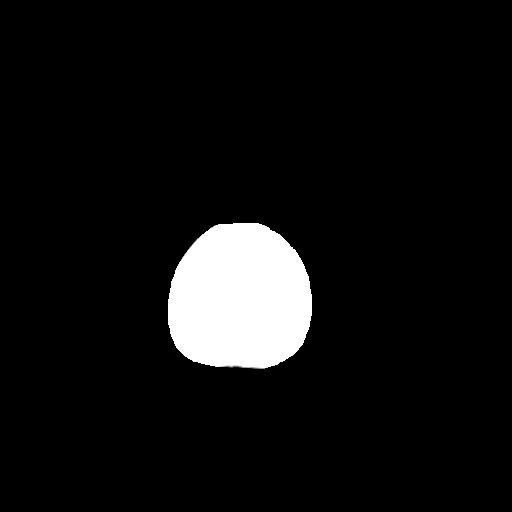

[Series 5: coronal soft · coronal · 0.34mm/px · 3 of 68 slices shown]
[im 23/68  brain]
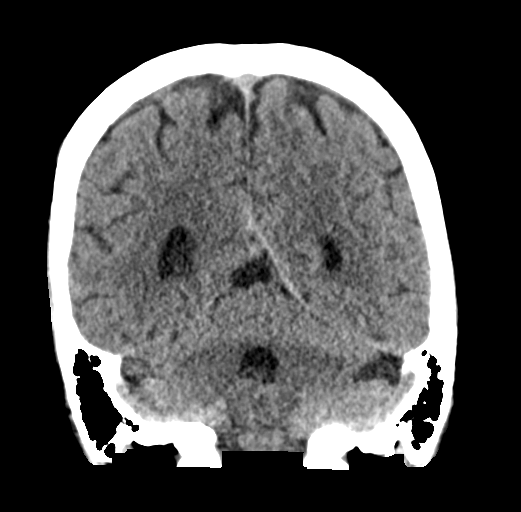
[im 30/68  brain]
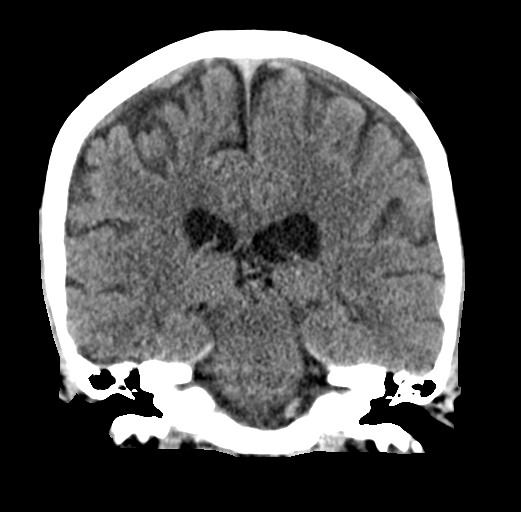
[im 38/68  brain]
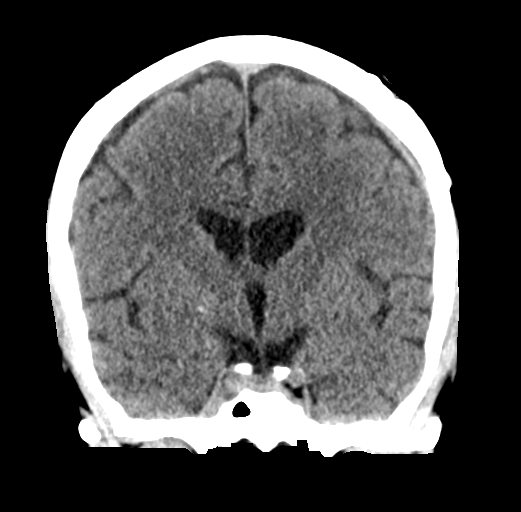

[13 of 47 positions shown; findings below may reference images not displayed]

FINDINGS: CT HEAD

Brain: Left cerebral convexity subdural hematoma is stable to
decreased in size. There is no definite new hemorrhage. No new loss
of gray-white differentiation. Ventricles and sulci are stable in
size and configuration.

Vascular: No hyperdense vessel.

Skull: Bilateral craniotomies.  Calvarium is otherwise unremarkable.

Sinuses/Orbits: No acute finding.

Other: None.

Review of the MIP images confirms the above findings

CTA NECK

Aortic arch: Great vessel origins are patent.

Right carotid system: Patent.  No stenosis.

Left carotid system: Patent.  No stenosis.

Vertebral arteries: Patent.  Codominant.  No stenosis.

Skeleton: Mild cervical spine degenerative changes.

Other neck: Unremarkable.

Upper chest: Moderate right pleural effusion.

Review of the MIP images confirms the above findings

CTA HEAD

Anterior circulation: Intracranial internal carotid arteries are
patent. Anterior and middle cerebral arteries are patent.

Posterior circulation: Intracranial vertebral arteries are patent.
Right vertebral becomes diminutive after PICA origin. Basilar artery
is patent. Major cerebellar artery origins are patent. Left
posterior communicating artery is present. Infundibular origin of a
small right posterior communicating artery. Posterior cerebral
arteries are patent.

Venous sinuses: Patent as allowed by contrast bolus timing.

Review of the MIP images confirms the above findings
IMPRESSION: No evidence of acute infarction. Left cerebral convexity subdural
hematoma is stable to minimally decreased. No new hemorrhage.

No large vessel occlusion, hemodynamically significant stenosis, or
evidence of dissection.

Moderate right pleural effusion.

These results were communicated to Dr. SLNE at [DATE] on
[DATE] by text page via the [employer] messaging system.

ADDENDUM:
Contrast: 100 mL Omnipaque 350 administered intravenously

*** End of Addendum ***
FINDINGS: CT HEAD

Brain: Left cerebral convexity subdural hematoma is stable to
decreased in size. There is no definite new hemorrhage. No new loss
of gray-white differentiation. Ventricles and sulci are stable in
size and configuration.

Vascular: No hyperdense vessel.

Skull: Bilateral craniotomies.  Calvarium is otherwise unremarkable.

Sinuses/Orbits: No acute finding.

Other: None.

Review of the MIP images confirms the above findings

CTA NECK

Aortic arch: Great vessel origins are patent.

Right carotid system: Patent.  No stenosis.

Left carotid system: Patent.  No stenosis.

Vertebral arteries: Patent.  Codominant.  No stenosis.

Skeleton: Mild cervical spine degenerative changes.

Other neck: Unremarkable.

Upper chest: Moderate right pleural effusion.

Review of the MIP images confirms the above findings

CTA HEAD

Anterior circulation: Intracranial internal carotid arteries are
patent. Anterior and middle cerebral arteries are patent.

Posterior circulation: Intracranial vertebral arteries are patent.
Right vertebral becomes diminutive after PICA origin. Basilar artery
is patent. Major cerebellar artery origins are patent. Left
posterior communicating artery is present. Infundibular origin of a
small right posterior communicating artery. Posterior cerebral
arteries are patent.

Venous sinuses: Patent as allowed by contrast bolus timing.

Review of the MIP images confirms the above findings
IMPRESSION: No evidence of acute infarction. Left cerebral convexity subdural
hematoma is stable to minimally decreased. No new hemorrhage.

No large vessel occlusion, hemodynamically significant stenosis, or
evidence of dissection.

Moderate right pleural effusion.

These results were communicated to Dr. SLNE at [DATE] on
[DATE] by text page via the [employer] messaging system.

## 2021-09-17 IMAGING — MR MR HEAD W/O CM
10 of 11 series · 43 of 48 positions shown · non-contrast
Comparison: Prior CTs from earlier the same day.

CLINICAL DATA: Initial evaluation for acute slurred speech.

EXAM:
MRI HEAD WITHOUT CONTRAST
TECHNIQUE: Multiplanar, multiecho pulse sequences of the brain and surrounding
structures were obtained without intravenous contrast.

[Series 5: DWI · axial · 3.0mm · 0.88mm/px · z∈[-119,+25]mm · 10 of 100 slices shown (1 of 4)]
[im 1/100]
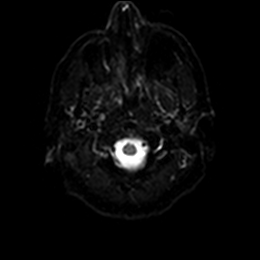
[im 12/100]
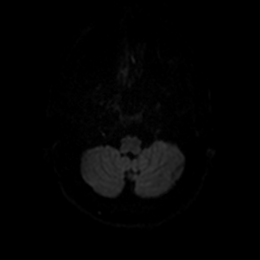
[im 23/100]
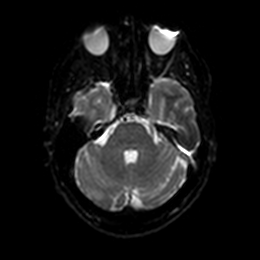
[im 34/100]
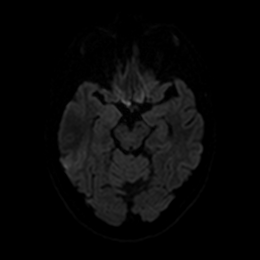
[im 45/100]
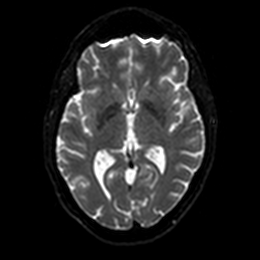
[im 56/100]
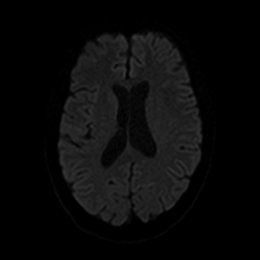
[im 67/100]
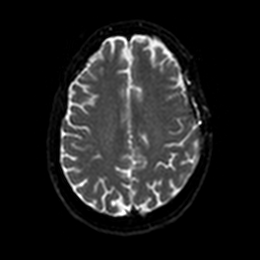
[im 78/100]
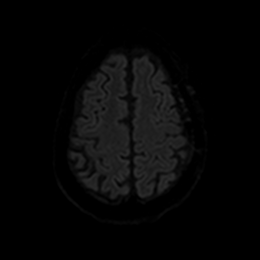
[im 89/100]
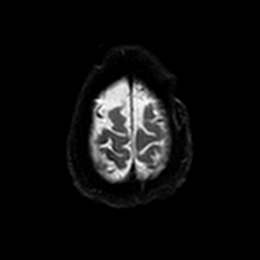
[im 100/100]
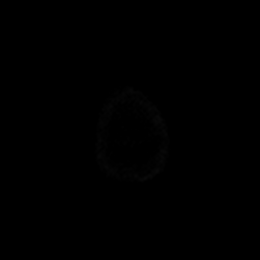

[Series 6: DWI · axial · 3.0mm · 0.88mm/px · z∈[-119,+25]mm · 5 of 50 slices shown (2 of 4)]
[im 1/50]
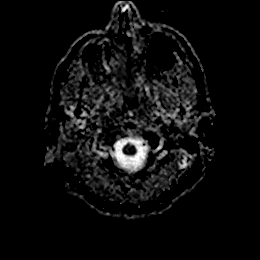
[im 13/50]
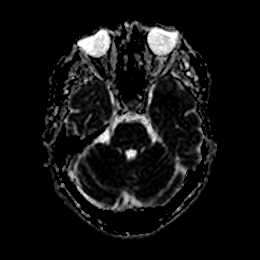
[im 25/50]
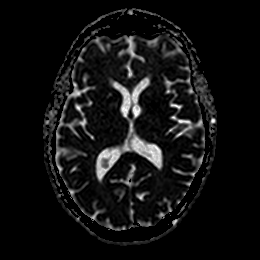
[im 37/50]
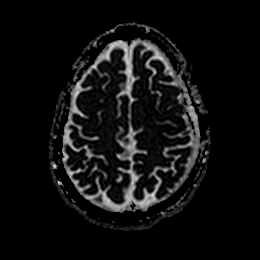
[im 50/50]
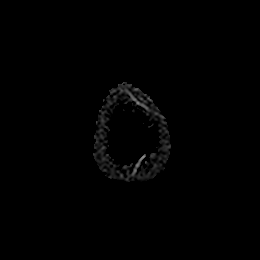

[Series 7: DWI · coronal · 4.0mm · 0.88mm/px · 6 of 66 slices shown (3 of 4)]
[im 1/66]
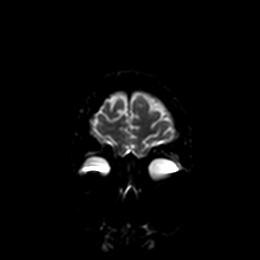
[im 14/66]
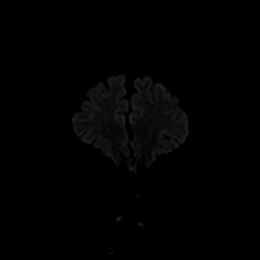
[im 27/66]
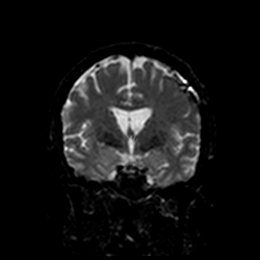
[im 40/66]
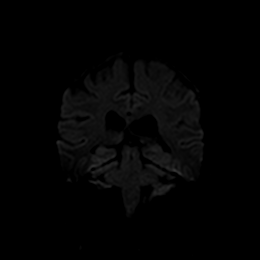
[im 53/66]
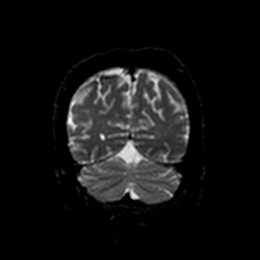
[im 66/66]
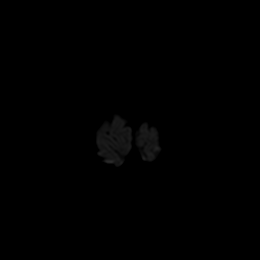

[Series 8: DWI · coronal · 4.0mm · 0.88mm/px · 3 of 33 slices shown (4 of 4)]
[im 1/33]
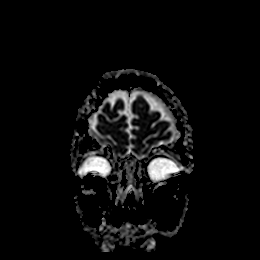
[im 17/33]
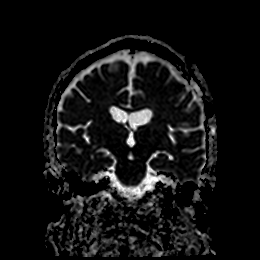
[im 33/33]
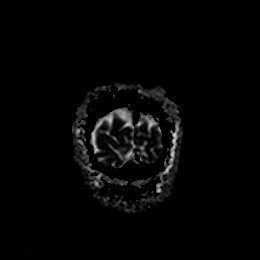

[Series 9: T1 · sagittal · 5.0mm · 0.75mm/px · 2 of 23 slices shown]
[im 1/23]
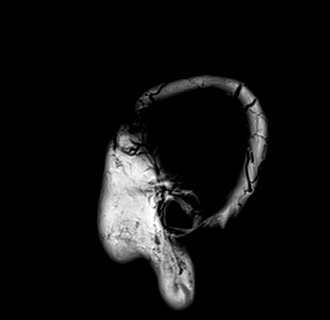
[im 23/23]
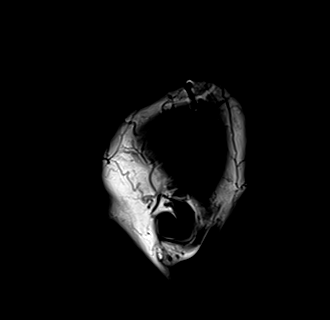

[Series 10: T2 · axial · 5.0mm · 0.72mm/px · z∈[-125,+28]mm · 2 of 27 slices shown (1 of 2)]
[im 1/27]
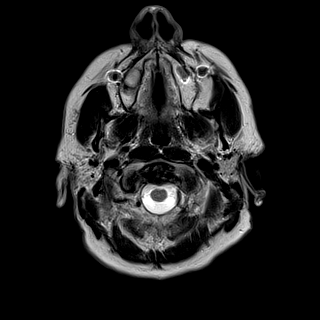
[im 27/27]
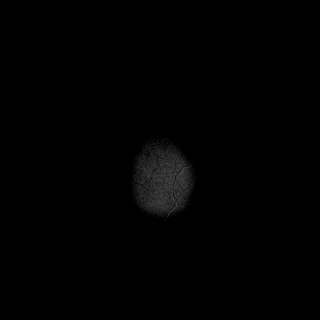

[Series 11: FLAIR · axial · 5.0mm · 0.45mm/px · z∈[-122,+31]mm · 2 of 27 slices shown]
[im 1/27]
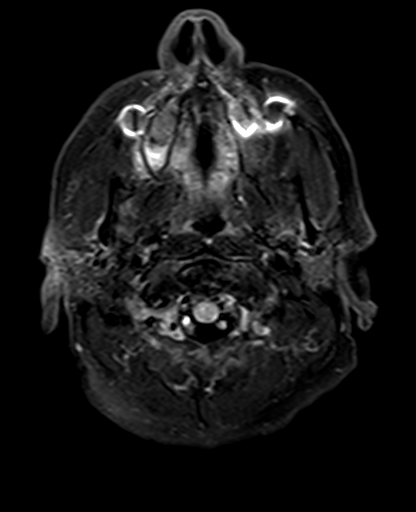
[im 27/27]
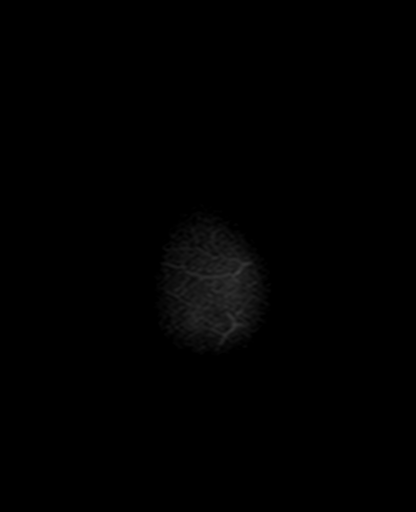

[Series 13: pha_images · axial · 3.0mm · 0.90mm/px · z∈[-132,+35]mm · 5 of 57 slices shown]
[im 1/57]
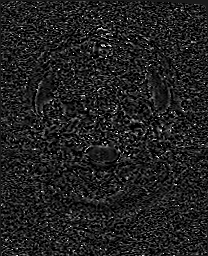
[im 15/57]
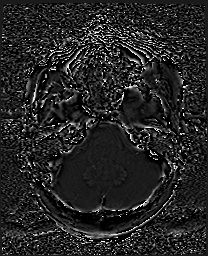
[im 29/57]
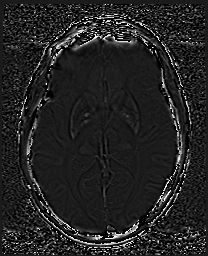
[im 43/57]
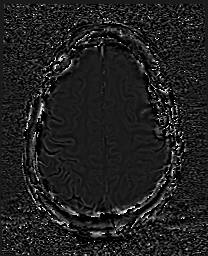
[im 57/57]
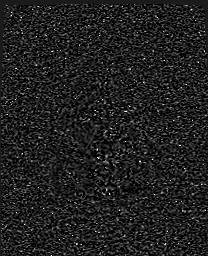

[Series 14: swi_images · axial · 3.0mm · 0.90mm/px · z∈[-132,+41]mm · 5 of 60 slices shown]
[im 1/60]
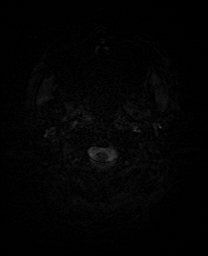
[im 15/60]
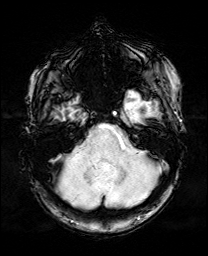
[im 30/60]
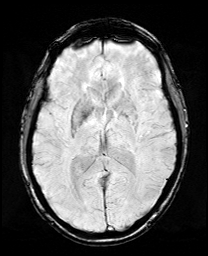
[im 45/60]
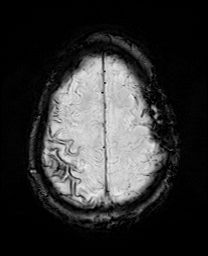
[im 60/60]
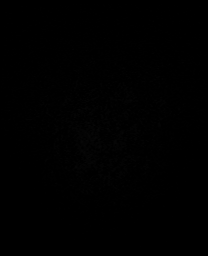

[Series 17: T2 · coronal · 5.0mm · 0.34mm/px · 3 of 29 slices shown (2 of 2)]
[im 1/29]
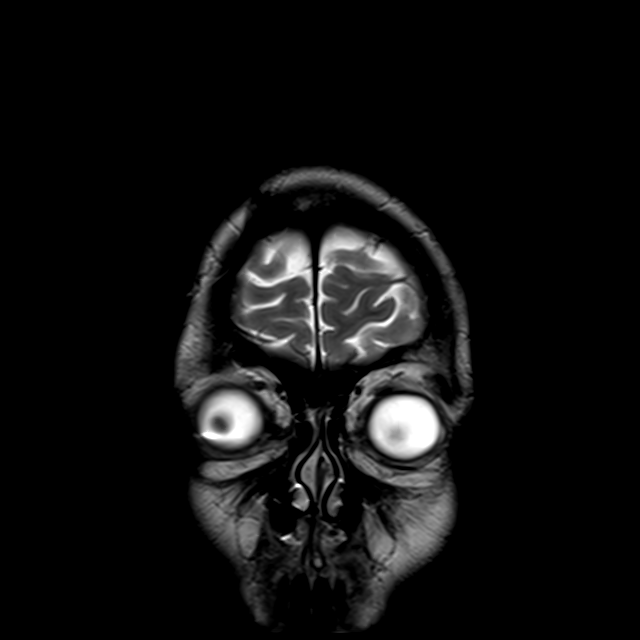
[im 15/29]
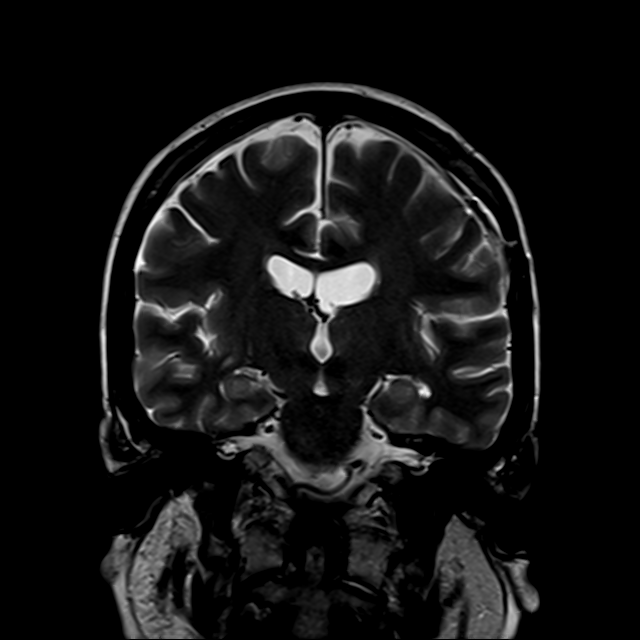
[im 29/29]
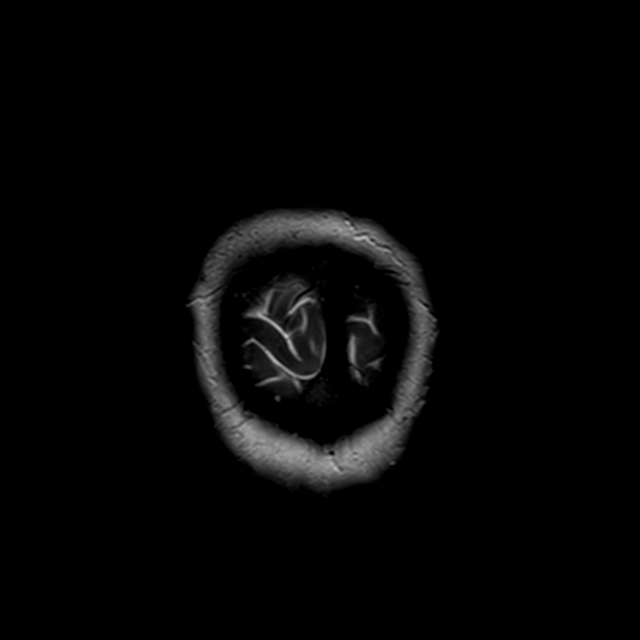

[43 of 48 positions shown; findings below may reference images not displayed]

FINDINGS: Brain: Cerebral volume stable, and remains within normal limits. No
significant cerebral white matter disease for age. No abnormal foci
of restricted diffusion to suggest acute or subacute ischemia.
Gray-white matter differentiation maintained. No areas of chronic
cortical infarction.

Sequelae of prior left frontal craniotomy noted. Residual left
subdural hematoma overlying the left cerebral convexity again noted,
measuring up to 4 mm in maximal thickness. A small chronic right
subdural hematoma is also seen, also measuring up to approximately
3-4 mm. No significant mass effect or midline shift. Chronic
hemosiderin staining noted within multiple cortical sulci of the
underlying frontal parietal regions, compatible with prior
hemorrhage.

No mass lesion. Ventricles normal size without hydrocephalus.
Basilar cisterns remain patent. Pituitary gland suprasellar region
normal. Midline structures intact.

Vascular: Major intracranial vascular flow voids are maintained.

Skull and upper cervical spine: Craniocervical junction within
normal limits. Bone marrow signal intensity normal. Prior bilateral
craniotomies, recent on the left. No acute scalp soft tissue
abnormality.

Sinuses/Orbits: Globes and orbital soft tissues demonstrate no acute
finding. Paranasal sinuses and mastoid air cells are clear.

Other: None.
IMPRESSION: 1. No acute intracranial abnormality.
2. Sequelae of prior bilateral craniotomies. Small residual subdural
hematomas measuring up to 4 mm without significant mass effect or
midline shift.
3. Small chronic right subdural hematoma measuring up to 3-4 mm
without significant mass effect.
4. Chronic hemosiderin staining within multiple cortical sulci of
the underlying frontoparietal regions, compatible with prior
subarachnoid hemorrhage.

## 2021-09-17 IMAGING — CT CT ANGIO HEAD-NECK (W OR W/O PERF)
2 of 12 series · 7 of 33 positions shown · IV contrast (APPLIED)
Comparison: None.
COMPARISON: None.

Addendum:
CLINICAL DATA: Neuro deficit, acute, stroke suspected

EXAM:
CT HEAD WITHOUT CONTRAST
CT ANGIOGRAPHY OF THE HEAD AND NECK
TECHNIQUE: Contiguous axial images were obtained from the base of the skull
through the vertex without intravenous contrast. Multidetector CT
imaging of the head and neck was performed using the standard
protocol during bolus administration of intravenous contrast.
Multiplanar CT image reconstructions and MIPs were obtained to
evaluate the vascular anatomy. Carotid stenosis measurements (when
applicable) are obtained utilizing NASCET criteria, using the distal
internal carotid diameter as the denominator.
RADIATION DOSE REDUCTION: This exam was performed according to the
departmental dose-optimization program which includes automated
exposure control, adjustment of the mA and/or kV according to
patient size and/or use of iterative reconstruction technique.

[Series 5: cta head · axial · 0.57mm/px · z∈[+1064,+1182]mm · 2 of 177 slices shown]
[im 59/177  soft-tissue]
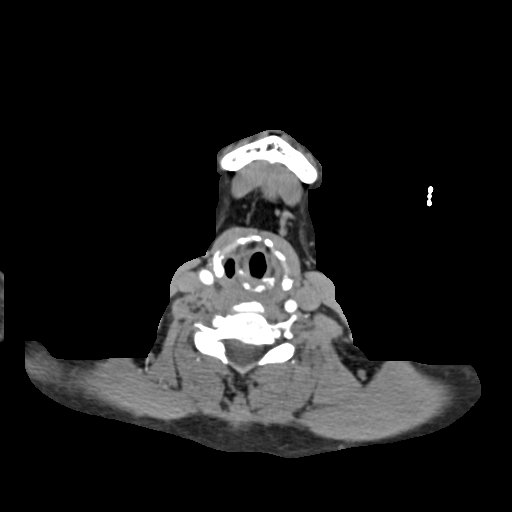
[im 118/177  soft-tissue]
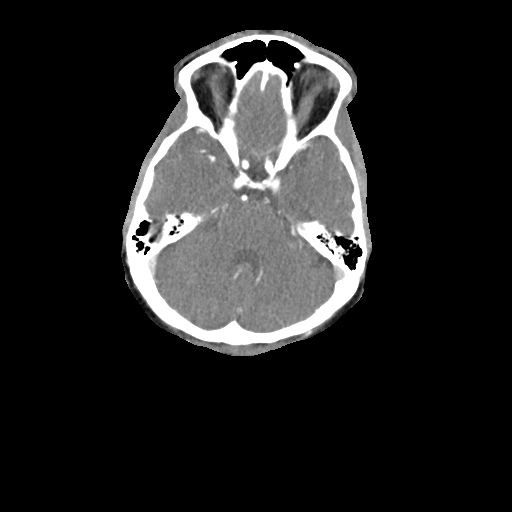

[Series 7: ax thin · axial · 0.56mm/px · z∈[+1007,+1243]mm · 5 of 354 slices shown]
[im 59/354  soft-tissue]
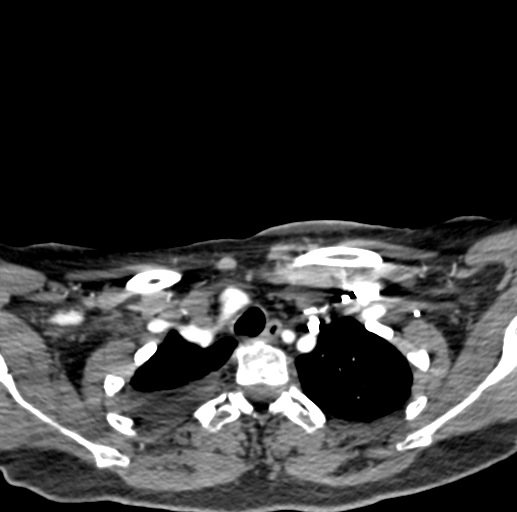
[im 118/354  bone]
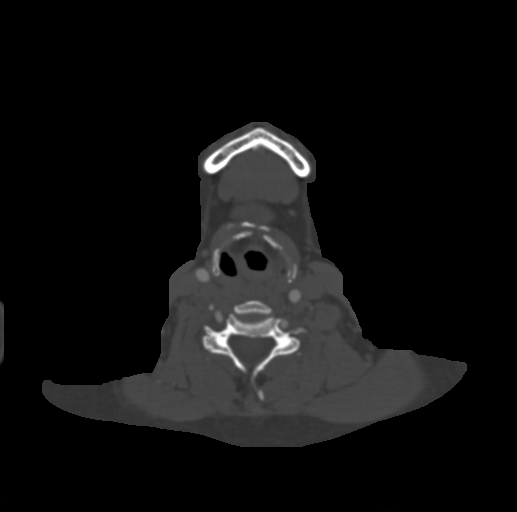
[im 177/354  soft-tissue]
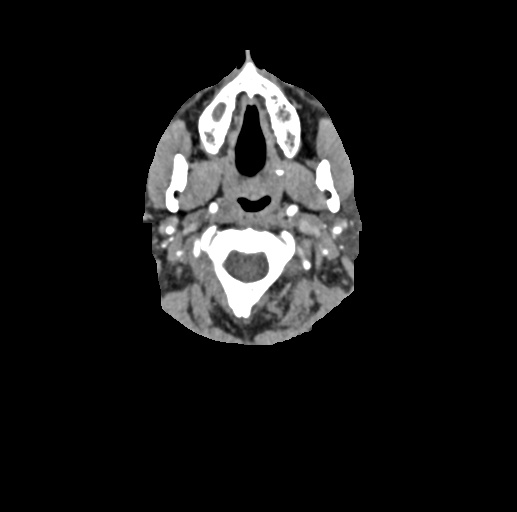
[im 236/354  bone]
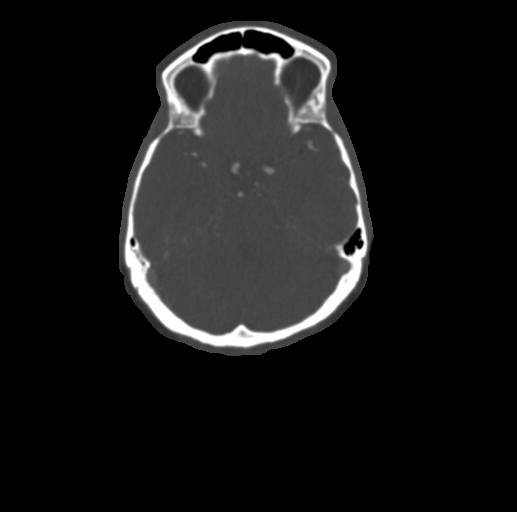
[im 295/354  soft-tissue]
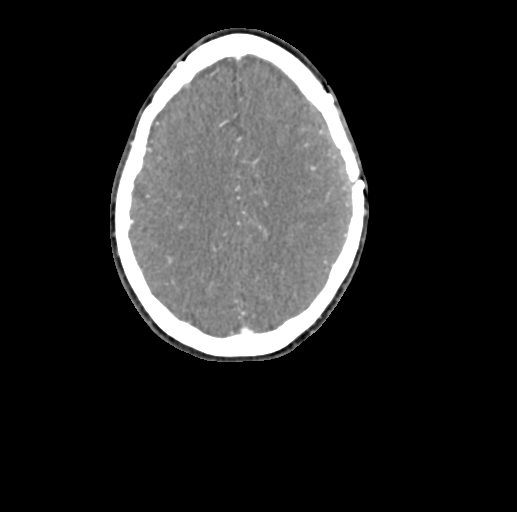

[7 of 33 positions shown; findings below may reference images not displayed]

FINDINGS: CT HEAD

Brain: Left cerebral convexity subdural hematoma is stable to
decreased in size. There is no definite new hemorrhage. No new loss
of gray-white differentiation. Ventricles and sulci are stable in
size and configuration.

Vascular: No hyperdense vessel.

Skull: Bilateral craniotomies.  Calvarium is otherwise unremarkable.

Sinuses/Orbits: No acute finding.

Other: None.

Review of the MIP images confirms the above findings

CTA NECK

Aortic arch: Great vessel origins are patent.

Right carotid system: Patent.  No stenosis.

Left carotid system: Patent.  No stenosis.

Vertebral arteries: Patent.  Codominant.  No stenosis.

Skeleton: Mild cervical spine degenerative changes.

Other neck: Unremarkable.

Upper chest: Moderate right pleural effusion.

Review of the MIP images confirms the above findings

CTA HEAD

Anterior circulation: Intracranial internal carotid arteries are
patent. Anterior and middle cerebral arteries are patent.

Posterior circulation: Intracranial vertebral arteries are patent.
Right vertebral becomes diminutive after PICA origin. Basilar artery
is patent. Major cerebellar artery origins are patent. Left
posterior communicating artery is present. Infundibular origin of a
small right posterior communicating artery. Posterior cerebral
arteries are patent.

Venous sinuses: Patent as allowed by contrast bolus timing.

Review of the MIP images confirms the above findings
IMPRESSION: No evidence of acute infarction. Left cerebral convexity subdural
hematoma is stable to minimally decreased. No new hemorrhage.

No large vessel occlusion, hemodynamically significant stenosis, or
evidence of dissection.

Moderate right pleural effusion.

These results were communicated to Dr. SLNE at [DATE] on
[DATE] by text page via the AMION messaging system.

ADDENDUM:
Contrast: 100 mL Omnipaque 350 administered intravenously

*** End of Addendum ***
FINDINGS: CT HEAD

Brain: Left cerebral convexity subdural hematoma is stable to
decreased in size. There is no definite new hemorrhage. No new loss
of gray-white differentiation. Ventricles and sulci are stable in
size and configuration.

Vascular: No hyperdense vessel.

Skull: Bilateral craniotomies.  Calvarium is otherwise unremarkable.

Sinuses/Orbits: No acute finding.

Other: None.

Review of the MIP images confirms the above findings

CTA NECK

Aortic arch: Great vessel origins are patent.

Right carotid system: Patent.  No stenosis.

Left carotid system: Patent.  No stenosis.

Vertebral arteries: Patent.  Codominant.  No stenosis.

Skeleton: Mild cervical spine degenerative changes.

Other neck: Unremarkable.

Upper chest: Moderate right pleural effusion.

Review of the MIP images confirms the above findings

CTA HEAD

Anterior circulation: Intracranial internal carotid arteries are
patent. Anterior and middle cerebral arteries are patent.

Posterior circulation: Intracranial vertebral arteries are patent.
Right vertebral becomes diminutive after PICA origin. Basilar artery
is patent. Major cerebellar artery origins are patent. Left
posterior communicating artery is present. Infundibular origin of a
small right posterior communicating artery. Posterior cerebral
arteries are patent.

Venous sinuses: Patent as allowed by contrast bolus timing.

Review of the MIP images confirms the above findings
IMPRESSION: No evidence of acute infarction. Left cerebral convexity subdural
hematoma is stable to minimally decreased. No new hemorrhage.

No large vessel occlusion, hemodynamically significant stenosis, or
evidence of dissection.

Moderate right pleural effusion.

These results were communicated to Dr. SLNE at [DATE] on
[DATE] by text page via the AMION messaging system.

## 2021-09-17 MED ORDER — SODIUM CHLORIDE 0.9% FLUSH
3.0000 mL | Freq: Once | INTRAVENOUS | Status: AC
  Administered 2021-09-17: 3 mL via INTRAVENOUS
  Filled 2021-09-17: qty 3

## 2021-09-17 MED ORDER — IOHEXOL 350 MG/ML SOLN
100.0000 mL | Freq: Once | INTRAVENOUS | Status: AC | PRN
  Administered 2021-09-17: 100 mL via INTRAVENOUS

## 2021-09-17 NOTE — Consult Note (Addendum)
[facility] Tele[facility]icine Consult   Requesting Provider: Dr Sherry Ruffing Consult Participants: Dr. Jerelyn Charles, Telespecialist RN Leana Roe   Bedside RN Valarie Cones Location of the provider: [facility] Location of the patient: [facility] 10  This consult was provided via telemedicine with 2-way video and audio communication. The patient/family was informed that care would be provided in this way and agreed to receive care in this manner.    Chief Complaint: word finding difficulty and slurred speech, right sided weakness, gait distrubance  HPI: 61 year old woman past medical history of leukemia, multiple subdural hematomas with most recent evacuation in November 2022, amongst other conditions listed below, brought in to the emergency room from work for evaluation of word finding difficulty, slurred speech and difficulty walking.  She was in her usual state of health, works as an Nurse, children's, was at work where the coworkers noticed that her speech was slurred, she was talking slow and also was having a difficult time with her speech-they also noted that she was walking "funny". She was last seen normal by family confirmed at 24 AM and the symptoms were noted around 9:15 AM. Brought in for emergent evaluation to the [facility] by daughter via personal vehicle. Code stroke activated. Noncontrasted head CT showed stable left convexity subdural hematoma.  No large vessel occlusion on CT angio.  Also has a moderate right pleural effusion on the CTA head and neck.  No reported seizures.  No reports of episodes of altered consciousness.   Past Medical History:  Diagnosis Date   Allergic rhinitis    Arthritis    Cleft palate    COVID 06/04/2021   mild to no symptoms   COVID-19 07/31/2019   tested positive, states mild aches/pains, fatigue   Diarrhea due to drug 06/30/2015   Fibroid    Grave's disease    H/O insomnia    H/O menorrhagia 2012   Headache    History of chicken pox    Hypertension     Hyperthyroidism    s/p rai 8/06 now hypothyroid Dr. 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 JO   Sleep apnea    has cpap does not use   Vitamin D deficiency      Current Facility-Administered Medications:    sodium chloride flush (NS) 0.9 % injection 3 mL, 3 mL, Intravenous, Once, Tegeler, Gwenyth Allegra, MD  Current Outpatient Medications:    acetaminophen (TYLENOL) 325 MG tablet, Take 650 mg by mouth every 6 (six) hours as needed for moderate pain or headache., Disp: , Rfl:    atorvastatin (LIPITOR) 20 MG tablet, Take 1 tablet (20 mg total) by mouth daily., Disp: 30 tablet, Rfl: 1   HYDROcodone-acetaminophen (NORCO/VICODIN) 5-325 MG tablet, Take 1 tablet by mouth every 4 (four) hours as needed for moderate pain., Disp: 30 tablet, Rfl: 0   levETIRAcetam (KEPPRA) 500 MG tablet, Take 1 tablet (500 mg total) by mouth every 12 (twelve) hours., Disp: 30 tablet, Rfl: 0   levETIRAcetam (KEPPRA) 500 MG tablet, Take 1 tablet (500 mg total) by mouth every 12 (twelve) hours., Disp: 60 tablet, Rfl: 0   losartan (COZAAR) 50 MG tablet, Take 1 tablet (50 mg total) by mouth daily., Disp: 90 tablet, Rfl: 2   metroNIDAZOLE (METROCREAM) 0.75 % cream, Apply 1 application topically daily. (  Patient taking differently: Apply 1 application topically daily as needed (dry skin).), Disp: 45 g, Rfl: 3   nortriptyline (PAMELOR) 25 MG capsule, Take 1 capsule (25 mg total) by mouth at bedtime. (Patient not taking: Reported on 06/27/2021), Disp: 30 capsule, Rfl: 3   SYNTHROID 175 MCG tablet, Take 1 tablet (175 mcg total) by mouth daily before breakfast., Disp: 90 tablet, Rfl: 0   tretinoin (RETIN-A) 0.025 % cream, Apply a pearl-sized amount to face in the evening once a day, Disp: 45 g, Rfl: 3   verapamil (CALAN-SR) 240 MG CR tablet, TAKE 1 TABLET  BY MOUTH AT BEDTIME., Disp: 90 tablet, Rfl: 3   zolpidem (AMBIEN) 10 MG tablet, TAKE 1 TABLET BY MOUTH AT BEDTIME AS NEEDED FOR SLEEP, Disp: 90 tablet, Rfl: 0    LKW: 8 AM IV thrombolysis given?: No, subdural hematoma IR Thrombectomy? No, no emergent LVO Modified Rankin Scale: 1-No significant post stroke disability and can perform usual duties with stroke symptoms Time of teleneurologist on camera: 1116 hrs  Exam: Vitals:   09/17/21 1215 09/17/21 1230  BP: (!) 156/90 (!) 152/79  Pulse: 62 (!) 58  Resp: (!) 23 14  Temp:    SpO2: 99% 100%    General: Awake alert in no distress Neurological exam Awake alert oriented x3 Speech is mildly dysarthric No evidence of aphasia Cranial nerves: Pupils equal round react light, extraocular movements intact, visual fields full, face appears mildly asymmetric with right facial weakness, facial sensation intact, tongue and palate midline. Motor examination with no drift in any of the 4 extremities Sensation intact to light touch    NIHSS 1A: Level of Consciousness - 0 1B: Ask Month and Age - 0 1C: 'Blink Eyes' & 'Squeeze Hands' - 0 2: Test Horizontal Extraocular Movements - 0 3: Test Visual Fields - 0 4: Test Facial Palsy - 1 5A: Test Left Arm Motor Drift - 0 5B: Test Right Arm Motor Drift - 0 6A: Test Left Leg Motor Drift - 0 6B: Test Right Leg Motor Drift - 0 7: Test Limb Ataxia - 0 8: Test Sensation - 0 9: Test Language/Aphasia- 0 10: Test Dysarthria - 1 11: Test Extinction/Inattention - 0 NIHSS score: 2   Imaging Reviewed: CT head Noncontrasted head CT showed stable left convexity subdural hematoma.  No large vessel occlusion on CT angio.  Also has a moderate right pleural effusion on the CTA head and neck. Labs reviewed in epic and pertinent values follow: CBC    Component Value Date/Time   WBC 4.9 09/17/2021 1130   RBC 4.34 09/17/2021 1130   HGB 13.0 09/17/2021 1130   HGB 13.2 06/14/2017 1111   HCT 39.1 09/17/2021  1130   HCT 38.6 06/14/2017 1111   PLT 250 09/17/2021 1130   PLT 321 06/14/2017 1111   MCV 90.1 09/17/2021 1130   MCV 91.9 06/14/2017 1111   MCH 30.0 09/17/2021 1130   MCHC 33.2 09/17/2021 1130   RDW 12.7 09/17/2021 1130   RDW 13.2 06/14/2017 1111   LYMPHSABS 1.1 09/17/2021 1130   LYMPHSABS 2.2 06/14/2017 1111   MONOABS 0.6 09/17/2021 1130   MONOABS 0.6 06/14/2017 1111   EOSABS 0.2 09/17/2021 1130   EOSABS 0.2 06/14/2017 1111   BASOSABS 0.1 09/17/2021 1130   BASOSABS 0.1 06/14/2017 1111   CMP     Component Value Date/Time   NA 138 09/17/2021 1130   NA 138 02/07/2017 0836   K 3.7 09/17/2021 1130   K 4.2 02/07/2017 0836  CL 103 09/17/2021 1130   CO2 26 09/17/2021 1130   CO2 23 02/07/2017 0836   GLUCOSE 96 09/17/2021 1130   GLUCOSE 85 02/07/2017 0836   BUN 22 09/17/2021 1130   BUN 15.5 02/07/2017 0836   CREATININE 0.71 09/17/2021 1130   CREATININE 0.9 02/07/2017 0836   CALCIUM 10.0 09/17/2021 1130   CALCIUM 9.5 02/07/2017 0836   PROT 7.0 09/17/2021 0842   PROT 7.0 02/07/2017 0836   ALBUMIN 4.3 09/17/2021 0842   ALBUMIN 3.8 02/07/2017 0836   AST 14 (L) 09/17/2021 0842   AST 25 02/07/2017 0836   ALT 13 09/17/2021 0842   ALT 36 02/07/2017 0836   ALKPHOS 49 09/17/2021 0842   ALKPHOS 55 02/07/2017 0836   BILITOT 0.4 09/17/2021 0842   BILITOT 0.31 02/07/2017 0836   GFRNONAA >60 09/17/2021 1130   GFRAA >60 03/03/2020 0747   Assessment:  61 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  On my examination, she has mild right lower facial weakness as well as dysarthria. Her noncontrasted head CT shows unchanged to minimally improved subdural hematoma over the left cerebral convexity. CT angiography head and neck is negative for large vessel occlusion. I suspect that her current presentation is most likely secondary  to seizures due to cortical irritation from the subdural on the left cerebral convexity A small stroke should also be ruled out. Also r/o toxic metabolic encephalopathy.  Recommendations:  -I would recommend that he be transferred to to [facility] emergency room. -MRI of the brain without contrast -Routine EEG -If there is any evidence of electrographic abnormalities on the EEG, she might need consideration for resuming antiepileptics.  She was on 2 weeks of Keppra post procedure and then discontinued the Keppra. -UA CXR   Neurology will follow with you at [place] was discussed with patient, her daughter and her husband Dr. Sarajane Jews at bedside.  Discussed with EDP Dr Sherry Ruffing  This patient is receiving care for possible acute neurological changes. There was 55 minutes of care by this provider at the time of service, including time for direct evaluation via telemedicine, review of medical records, imaging studies and discussion of findings with providers, the patient and/or family.  -- Amie Portland, MD [facility] Pager: [phone] If 7pm to 7am, please call on call as listed on AMION.

## 2021-09-17 NOTE — Discharge Instructions (Signed)
Follow-up with local doctor and neurology outpatient. Return for new concerns.

## 2021-09-17 NOTE — ED Notes (Signed)
Pt transported to MRI 

## 2021-09-17 NOTE — Progress Notes (Signed)
EEG completed, results pending. 

## 2021-09-17 NOTE — ED Notes (Signed)
Patient returned from MRI.

## 2021-09-17 NOTE — ED Provider Notes (Signed)
Pt is a transfer from [place].  Pt had some aphasia and a facial droop.  Dr. Malen Gauze (neurology) consulted.  He recommended transfer to [facility] ED for MRI and EEG.  Pt feels that her speech is back to normal.  EEG and MRI have been ordered.  Dr. Leonel Ramsay (neurology) is aware she is here.  He will review the EEG.   Isla Pence, MD 09/17/21 [phone]

## 2021-09-17 NOTE — ED Notes (Signed)
Report given to [facility]  charge and care link.

## 2021-09-17 NOTE — ED Notes (Signed)
Taken to CT at this time. 

## 2021-09-17 NOTE — ED Triage Notes (Signed)
Patient reports to the ER for slurred speech, dizziness, balance problems, and trouble finding her words. Patient reports she just felt tired this morning and had a hard time getting up but nothing else abnormal. States she was at work and at [employer] she started having slurred speech and noticed she was walking funny.

## 2021-09-17 NOTE — ED Provider Notes (Signed)
Patient care signed out to follow-up EEG and MRI.  Discussed with neurology in the ER Dr. Leonel Ramsay reviewed EEG and no abnormalities.  MRI delayed however when obtained showed 4 mm chronic subdural no acute abnormalities reviewed results.  Initially the plan was to observe overnight and neuro rechecks, patient prefers to be discharged and follow-up outpatient.  Patient's significant other is a physician and they both comfortable this plan.  Golda Acre, MD 09/17/21 2106

## 2021-09-17 NOTE — Procedures (Signed)
[facility]  Routine EEG Report  Patient Name:   [name], Christina Date of Birth:   01/12/1961 Identification Number:   MRN - [id number]  Date of Study:   09/17/2021 15:31:36  Indication: Spells, Eval for Seizures,  Duration: 22 min  Medication/AED: Keppra  Technical Summary: A routine 20 channel electroencephalogram using the international 10-20 system of electrode placement was performed.  Background: 9-10 Hz, Posterior dominant rhythm that attenuated with eye Opening  States: Awake   Activation Procedures Hyperventilation and Photic Stimulation: Not performed  Classification: Normal   Diagnosis: Normal Awake study. There are no epileptiform discharges.  Clinical Correlation: This is a normal study. The absence of interictal epileptiform abnormalities does not exclude the diagnosis of a seizure disorder.      Dr Annabelle Harman   [facility] [phone]  Case [id number]

## 2021-09-17 NOTE — ED Notes (Signed)
Patient transported to EEG

## 2021-09-17 NOTE — ED Notes (Signed)
Arrived to [facility] ED from [place] via [place]. Tx here for MRI and EEG. LKN [id number]. Hx of SDH.

## 2021-09-17 NOTE — ED Notes (Signed)
MRI investigating implant prior to transport.

## 2021-09-17 NOTE — ED Notes (Signed)
Patient remains in imaging 

## 2021-09-17 NOTE — ED Provider Notes (Signed)
[facility] EMERGENCY DEPT Provider Note   CSN: [id number] Arrival date & time: 09/17/21  1054     History  Chief Complaint  Patient presents with   Aphasia   Dizziness    Christina [name] is a 61 y.o. female.  The history is provided by the patient, a relative and medical records. No language interpreter was used.  Dizziness Quality:  Unable to specify Associated symptoms: no chest pain, no diarrhea, no headaches, no nausea, no palpitations, no shortness of breath, no vomiting and no weakness   Neurologic Problem This is a new problem. The current episode started 3 to 5 hours ago. The problem occurs constantly. The problem has not changed since onset.Pertinent negatives include no chest pain, no abdominal pain, no headaches and no shortness of breath. Nothing aggravates the symptoms. Nothing relieves the symptoms. She has tried nothing for the symptoms. The treatment provided no relief.      Home Medications Prior to Admission medications   Medication Sig Start Date End Date Taking? Authorizing Provider  acetaminophen (TYLENOL) 325 MG tablet Take 650 mg by mouth every 6 (six) hours as needed for moderate pain or headache.    [provider]  atorvastatin (LIPITOR) 20 MG tablet Take 1 tablet (20 mg total) by mouth daily. 06/29/21   Eustace Moore, MD  HYDROcodone-acetaminophen (NORCO/VICODIN) 5-325 MG tablet Take 1 tablet by mouth every 4 (four) hours as needed for moderate pain. 06/05/21   Meyran, Ocie Cornfield, NP  levETIRAcetam (KEPPRA) 500 MG tablet Take 1 tablet (500 mg total) by mouth every 12 (twelve) hours. 06/05/21   Meyran, Ocie Cornfield, NP  levETIRAcetam (KEPPRA) 500 MG tablet Take 1 tablet (500 mg total) by mouth every 12 (twelve) hours. 06/30/21     losartan (COZAAR) 50 MG tablet Take 1 tablet (50 mg total) by mouth daily. 05/02/21   Panosh, Standley Brooking, MD  metroNIDAZOLE (METROCREAM) 0.75 % cream Apply 1 application topically daily. Patient taking  differently: Apply 1 application topically daily as needed (dry skin). 03/10/21     nortriptyline (PAMELOR) 25 MG capsule Take 1 capsule (25 mg total) by mouth at bedtime. Patient not taking: Reported on 06/27/2021 05/31/21   Pieter Partridge, DO  SYNTHROID 175 MCG tablet Take 1 tablet (175 mcg total) by mouth daily before breakfast. 09/02/21   Panosh, Standley Brooking, MD  tretinoin (RETIN-A) 0.025 % cream Apply a pearl-sized amount to face in the evening once a day 09/14/21     verapamil (CALAN-SR) 240 MG CR tablet TAKE 1 TABLET BY MOUTH AT BEDTIME. 07/14/21 07/14/22  Panosh, Standley Brooking, MD  zolpidem (AMBIEN) 10 MG tablet TAKE 1 TABLET BY MOUTH AT BEDTIME AS NEEDED FOR SLEEP 09/02/21 03/01/22  Panosh, Standley Brooking, MD      Allergies    Patient has no known allergies.    Review of Systems   Review of Systems  Constitutional:  Negative for chills, diaphoresis, fatigue and fever.  HENT:  Negative for congestion.   Eyes:  Negative for visual disturbance.  Respiratory:  Negative for cough, chest tightness, shortness of breath and wheezing.   Cardiovascular:  Negative for chest pain and palpitations.  Gastrointestinal:  Negative for abdominal pain, constipation, diarrhea, nausea and vomiting.  Genitourinary:  Negative for dysuria and flank pain.  Musculoskeletal:  Negative for back pain, neck pain and neck stiffness.  Skin:  Negative for rash and wound.  Neurological:  Positive for dizziness, facial asymmetry and speech difficulty. Negative for weakness, light-headedness,  numbness and headaches.  Psychiatric/Behavioral:  Negative for agitation.   All other systems reviewed and are negative.  Physical Exam Updated Vital Signs BP (!) 152/80    Pulse (!) 57    Temp 98.4 F (36.9 C) (Oral)    Resp 15    Ht 5\' 8"  (1.727 m)    Wt 77 kg    SpO2 99%    BMI 25.81 kg/m  Physical Exam Vitals and nursing note reviewed.  Constitutional:      General: She is not in acute distress.    Appearance: She is well-developed. She is  not ill-appearing, toxic-appearing or diaphoretic.  HENT:     Head: Normocephalic and atraumatic.     Nose: No congestion or rhinorrhea.     Mouth/Throat:     Mouth: Mucous membranes are moist.     Pharynx: No oropharyngeal exudate or posterior oropharyngeal erythema.  Eyes:     Extraocular Movements: Extraocular movements intact.     Conjunctiva/sclera: Conjunctivae normal.     Pupils: Pupils are equal, round, and reactive to light.  Cardiovascular:     Rate and Rhythm: Normal rate and regular rhythm.     Heart sounds: No murmur heard. Pulmonary:     Effort: Pulmonary effort is normal. No respiratory distress.     Breath sounds: Normal breath sounds. No wheezing, rhonchi or rales.  Chest:     Chest wall: No tenderness.  Abdominal:     General: Abdomen is flat.     Palpations: Abdomen is soft.     Tenderness: There is no abdominal tenderness. There is no right CVA tenderness, left CVA tenderness, guarding or rebound.  Musculoskeletal:        General: No swelling or tenderness.     Cervical back: Neck supple.  Skin:    General: Skin is warm and dry.     Capillary Refill: Capillary refill takes less than 2 seconds.     Findings: No erythema.  Neurological:     Mental Status: She is alert.     Cranial Nerves: Facial asymmetry present.     Sensory: No sensory deficit.     Motor: Abnormal muscle tone present.     Coordination: Finger-Nose-Finger Test normal.     Comments: Right-sided facial droop.  Also some aphasia.  No other focal neurologic deficits initially.  Psychiatric:        Mood and Affect: Mood normal.    ED Results / Procedures / Treatments   Labs (all labs ordered are listed, but only abnormal results are displayed) Labs Reviewed  URINALYSIS, ROUTINE W REFLEX MICROSCOPIC - Abnormal; Notable for the following components:      Result Value   Color, Urine COLORLESS (*)    All other components within normal limits  BASIC METABOLIC PANEL  CBC  PROTIME-INR  APTT   DIFFERENTIAL  CBG MONITORING, ED  CBG MONITORING, ED    EKG EKG Interpretation  Date/Time:  Friday September 17 2021 11:04:29 EST Ventricular Rate:  76 PR Interval:  170 QRS Duration: 96 QT Interval:  420 QTC Calculation: 472 R Axis:   13 Text Interpretation: Normal sinus rhythm Nonspecific ST abnormality Abnormal ECG When compared to prior, similar appearance. No STEMI Confirmed by Antony Blackbird [phone]) on 09/17/2021 1:38:40 PM  Radiology CT HEAD CODE STROKE WO CONTRAST  Addendum Date: 09/17/2021   ADDENDUM REPORT: 09/17/2021 11:48 ADDENDUM: Contrast: 100 mL Omnipaque 350 administered intravenously Electronically Signed   By: Macy Mis M.D.   On:  09/17/2021 11:48   Result Date: 09/17/2021 CLINICAL DATA:  Neuro deficit, acute, stroke suspected EXAM: CT HEAD WITHOUT CONTRAST CT ANGIOGRAPHY OF THE HEAD AND NECK TECHNIQUE: Contiguous axial images were obtained from the base of the skull through the vertex without intravenous contrast. Multidetector CT imaging of the head and neck was performed using the standard protocol during bolus administration of intravenous contrast. Multiplanar CT image reconstructions and MIPs were obtained to evaluate the vascular anatomy. Carotid stenosis measurements (when applicable) are obtained utilizing NASCET criteria, using the distal internal carotid diameter as the denominator. RADIATION DOSE REDUCTION: This exam was performed according to the departmental dose-optimization program which includes automated exposure control, adjustment of the mA and/or kV according to patient size and/or use of iterative reconstruction technique. COMPARISON:  None. FINDINGS: CT HEAD Brain: Left cerebral convexity subdural hematoma is stable to decreased in size. There is no definite new hemorrhage. No new loss of gray-white differentiation. Ventricles and sulci are stable in size and configuration. Vascular: No hyperdense vessel. Skull: Bilateral craniotomies.  Calvarium is  otherwise unremarkable. Sinuses/Orbits: No acute finding. Other: None. Review of the MIP images confirms the above findings CTA NECK Aortic arch: Great vessel origins are patent. Right carotid system: Patent.  No stenosis. Left carotid system: Patent.  No stenosis. Vertebral arteries: Patent.  Codominant.  No stenosis. Skeleton: Mild cervical spine degenerative changes. Other neck: Unremarkable. Upper chest: Moderate right pleural effusion. Review of the MIP images confirms the above findings CTA HEAD Anterior circulation: Intracranial internal carotid arteries are patent. Anterior and middle cerebral arteries are patent. Posterior circulation: Intracranial vertebral arteries are patent. Right vertebral becomes diminutive after PICA origin. Basilar artery is patent. Major cerebellar artery origins are patent. Left posterior communicating artery is present. Infundibular origin of a small right posterior communicating artery. Posterior cerebral arteries are patent. Venous sinuses: Patent as allowed by contrast bolus timing. Review of the MIP images confirms the above findings IMPRESSION: No evidence of acute infarction. Left cerebral convexity subdural hematoma is stable to minimally decreased. No new hemorrhage. No large vessel occlusion, hemodynamically significant stenosis, or evidence of dissection. Moderate right pleural effusion. These results were communicated to Dr. Rory Percy at 11:38 am on 09/17/2021 by text page via the [facility] messaging system. Electronically Signed: By: Macy Mis M.D. On: 09/17/2021 11:44   CT ANGIO HEAD NECK W WO CM (CODE STROKE)  Addendum Date: 09/17/2021   ADDENDUM REPORT: 09/17/2021 11:48 ADDENDUM: Contrast: 100 mL Omnipaque 350 administered intravenously Electronically Signed   By: Macy Mis M.D.   On: 09/17/2021 11:48   Result Date: 09/17/2021 CLINICAL DATA:  Neuro deficit, acute, stroke suspected EXAM: CT HEAD WITHOUT CONTRAST CT ANGIOGRAPHY OF THE HEAD AND NECK TECHNIQUE:  Contiguous axial images were obtained from the base of the skull through the vertex without intravenous contrast. Multidetector CT imaging of the head and neck was performed using the standard protocol during bolus administration of intravenous contrast. Multiplanar CT image reconstructions and MIPs were obtained to evaluate the vascular anatomy. Carotid stenosis measurements (when applicable) are obtained utilizing NASCET criteria, using the distal internal carotid diameter as the denominator. RADIATION DOSE REDUCTION: This exam was performed according to the departmental dose-optimization program which includes automated exposure control, adjustment of the mA and/or kV according to patient size and/or use of iterative reconstruction technique. COMPARISON:  None. FINDINGS: CT HEAD Brain: Left cerebral convexity subdural hematoma is stable to decreased in size. There is no definite new hemorrhage. No new loss of gray-white differentiation. Ventricles  and sulci are stable in size and configuration. Vascular: No hyperdense vessel. Skull: Bilateral craniotomies.  Calvarium is otherwise unremarkable. Sinuses/Orbits: No acute finding. Other: None. Review of the MIP images confirms the above findings CTA NECK Aortic arch: Great vessel origins are patent. Right carotid system: Patent.  No stenosis. Left carotid system: Patent.  No stenosis. Vertebral arteries: Patent.  Codominant.  No stenosis. Skeleton: Mild cervical spine degenerative changes. Other neck: Unremarkable. Upper chest: Moderate right pleural effusion. Review of the MIP images confirms the above findings CTA HEAD Anterior circulation: Intracranial internal carotid arteries are patent. Anterior and middle cerebral arteries are patent. Posterior circulation: Intracranial vertebral arteries are patent. Right vertebral becomes diminutive after PICA origin. Basilar artery is patent. Major cerebellar artery origins are patent. Left posterior communicating artery is  present. Infundibular origin of a small right posterior communicating artery. Posterior cerebral arteries are patent. Venous sinuses: Patent as allowed by contrast bolus timing. Review of the MIP images confirms the above findings IMPRESSION: No evidence of acute infarction. Left cerebral convexity subdural hematoma is stable to minimally decreased. No new hemorrhage. No large vessel occlusion, hemodynamically significant stenosis, or evidence of dissection. Moderate right pleural effusion. These results were communicated to Dr. Rory Percy at 11:38 am on 09/17/2021 by text page via the [facility] messaging system. Electronically Signed: By: Macy Mis M.D. On: 09/17/2021 11:44    Procedures Procedures    Medications Ordered in ED Medications  sodium chloride flush (NS) 0.9 % injection 3 mL (3 mLs Intravenous Given 09/17/21 1120)  iohexol (OMNIPAQUE) 350 MG/ML injection 100 mL (100 mLs Intravenous Contrast Given 09/17/21 1130)    ED Course/ Medical Decision Making/ A&P                           Medical Decision Making Amount and/or Complexity of Data Reviewed Labs: ordered. Radiology: ordered.  Risk Prescription drug management.   Christina [name] is a 61 y.o. female with a past medical history significant for Graves' disease, hyperthyroidism, CML, hypertension, previous subdural hematoma status postcraniotomy and evacuation in November 2022 who presents the emergency department for speech abnormality, facial droop, and difficulty with ambulation.  According to daughter, patient was last normal at 8 AM this morning when she spoke to her daughter in person.  She then went to work at [employer] and she was noted to have difficulty speaking.  Patient also thought she felt little unsteady on her feet.  She did not fall or have any trauma.  Patient is denying any headache or neck pain and presents for evaluation.  I was asked to see patient in triage due to possibility of neurologic concern.  On my quick assessment,  patient does indeed have right-sided facial droop that is subtle but present as well as some aphasia versus dysarthria.  She had pupils are symmetric and reactive normal extract movements.  Normal finger-nose-finger testing, normal strength and sensation in arms and legs.  Did not assess gait initially.  Otherwise lungs clear and chest nontender.  Abdomen nontender.  Patient agrees that she has some difficulty with her speech.  Decision made to activate code stroke.  Patient quickly taken to CT scanner and was seen by teleneurology.  Teleneurology is concerned that patient could be having seizure versus a stroke.  The CT scan showed persistent subdural.  They requested she be transferred ED to ED for neurology evaluation as well as MRI and EEG.   12:53 PM Neurology recommends  ED to ED transfer that she can get MRI without contrast of the brain as well as stat EEG in the emergency department and get seen by neurology.  They are concerned she could be having seizures or stroke.  The CT imaging earlier showed some subdural but no acute changes.  Patient excepted in transfer to [facility] emergency department by Dr. Isla Pence.  Patient will be transported.         Final Clinical Impression(s) / ED Diagnoses Final diagnoses:  Aphasia  Facial droop     Clinical Impression: 1. Aphasia   2. Facial droop     Disposition: ED to ED transfer to be assessed by neurology, get MRI, and obtain EEG.  Patient accepted by Dr. Isla Pence for transfer.  This note was prepared with assistance of Systems analyst. Occasional wrong-word or sound-a-like substitutions may have occurred due to the inherent limitations of voice recognition software.     Garnett Nunziata, Gwenyth Allegra, MD 09/17/21 1341

## 2021-09-20 ENCOUNTER — Other Ambulatory Visit (HOSPITAL_COMMUNITY): Payer: Self-pay

## 2021-09-23 ENCOUNTER — Telehealth: Payer: Self-pay

## 2021-09-23 LAB — BCR/ABL

## 2021-09-23 NOTE — Telephone Encounter (Signed)
-----   Message from Heath Lark, MD sent at 09/23/2021  8:37 AM EST ----- Pls call and let her know BCR?ABL not detected, she is still in remission

## 2021-09-23 NOTE — Telephone Encounter (Signed)
Called and given below message. She verbalized understanding. 

## 2021-09-29 ENCOUNTER — Other Ambulatory Visit: Payer: Self-pay | Admitting: Neurological Surgery

## 2021-09-29 ENCOUNTER — Other Ambulatory Visit (HOSPITAL_BASED_OUTPATIENT_CLINIC_OR_DEPARTMENT_OTHER): Payer: Self-pay | Admitting: Neurological Surgery

## 2021-09-29 DIAGNOSIS — S065XAA Traumatic subdural hemorrhage with loss of consciousness status unknown, initial encounter: Secondary | ICD-10-CM

## 2021-10-08 ENCOUNTER — Ambulatory Visit: Payer: 59 | Admitting: Neurology

## 2021-10-08 ENCOUNTER — Telehealth: Payer: Self-pay | Admitting: Hematology and Oncology

## 2021-10-08 NOTE — Telephone Encounter (Signed)
.  Called patient to schedule appointment per 3/2 inbasket, patient is aware of date and time.   ?

## 2021-10-11 ENCOUNTER — Telehealth: Payer: Self-pay

## 2021-10-11 ENCOUNTER — Other Ambulatory Visit (HOSPITAL_COMMUNITY): Payer: Self-pay

## 2021-10-11 ENCOUNTER — Other Ambulatory Visit: Payer: Self-pay | Admitting: Internal Medicine

## 2021-10-11 ENCOUNTER — Encounter: Payer: 59 | Admitting: Internal Medicine

## 2021-10-11 DIAGNOSIS — E039 Hypothyroidism, unspecified: Secondary | ICD-10-CM

## 2021-10-11 DIAGNOSIS — E782 Mixed hyperlipidemia: Secondary | ICD-10-CM

## 2021-10-11 DIAGNOSIS — I10 Essential (primary) hypertension: Secondary | ICD-10-CM

## 2021-10-11 DIAGNOSIS — Z79899 Other long term (current) drug therapy: Secondary | ICD-10-CM

## 2021-10-11 DIAGNOSIS — Z0000 Encounter for general adult medical examination without abnormal findings: Secondary | ICD-10-CM

## 2021-10-11 NOTE — Telephone Encounter (Signed)
Appt change please place lab orders ?

## 2021-10-11 NOTE — Telephone Encounter (Signed)
Future lab orders have been placed  to be obtained before   upcoming cpx

## 2021-10-11 NOTE — Progress Notes (Unsigned)
?  Lab Results  ?Component Value Date  ? WBC 4.9 09/17/2021  ? HGB 13.0 09/17/2021  ? HCT 39.1 09/17/2021  ? PLT 250 09/17/2021  ? GLUCOSE 96 09/17/2021  ? CHOL 199 10/29/2019  ? TRIG 113.0 10/29/2019  ? HDL 53.50 10/29/2019  ? LDLDIRECT 162.0 04/24/2019  ? LDLCALC 123 (H) 10/29/2019  ? ALT 13 09/17/2021  ? AST 14 (L) 09/17/2021  ? NA 138 09/17/2021  ? K 3.7 09/17/2021  ? CL 103 09/17/2021  ? CREATININE 0.71 09/17/2021  ? BUN 22 09/17/2021  ? CO2 26 09/17/2021  ? TSH 3.57 06/03/2020  ? INR 0.9 09/17/2021  ? HGBA1C 5.6 04/24/2019  ? ? ?

## 2021-10-14 ENCOUNTER — Other Ambulatory Visit: Payer: 59

## 2021-10-15 ENCOUNTER — Inpatient Hospital Stay: Payer: 59 | Attending: Hematology and Oncology

## 2021-10-15 ENCOUNTER — Encounter: Payer: Self-pay | Admitting: Hematology and Oncology

## 2021-10-15 ENCOUNTER — Other Ambulatory Visit: Payer: Self-pay

## 2021-10-15 ENCOUNTER — Inpatient Hospital Stay: Payer: 59

## 2021-10-15 ENCOUNTER — Other Ambulatory Visit: Payer: 59

## 2021-10-15 DIAGNOSIS — C9210 Chronic myeloid leukemia, BCR/ABL-positive, not having achieved remission: Secondary | ICD-10-CM | POA: Insufficient documentation

## 2021-10-15 LAB — CBC WITH DIFFERENTIAL/PLATELET
Abs Immature Granulocytes: 0.02 10*3/uL (ref 0.00–0.07)
Basophils Absolute: 0.1 10*3/uL (ref 0.0–0.1)
Basophils Relative: 1 %
Eosinophils Absolute: 0.4 10*3/uL (ref 0.0–0.5)
Eosinophils Relative: 7 %
HCT: 39.9 % (ref 36.0–46.0)
Hemoglobin: 13.6 g/dL (ref 12.0–15.0)
Immature Granulocytes: 0 %
Lymphocytes Relative: 27 %
Lymphs Abs: 1.4 10*3/uL (ref 0.7–4.0)
MCH: 30 pg (ref 26.0–34.0)
MCHC: 34.1 g/dL (ref 30.0–36.0)
MCV: 87.9 fL (ref 80.0–100.0)
Monocytes Absolute: 0.5 10*3/uL (ref 0.1–1.0)
Monocytes Relative: 10 %
Neutro Abs: 2.9 10*3/uL (ref 1.7–7.7)
Neutrophils Relative %: 55 %
Platelets: 281 10*3/uL (ref 150–400)
RBC: 4.54 MIL/uL (ref 3.87–5.11)
RDW: 12.4 % (ref 11.5–15.5)
WBC: 5.2 10*3/uL (ref 4.0–10.5)
nRBC: 0 % (ref 0.0–0.2)

## 2021-10-15 LAB — COMPREHENSIVE METABOLIC PANEL
ALT: 13 U/L (ref 0–44)
AST: 14 U/L — ABNORMAL LOW (ref 15–41)
Albumin: 4.2 g/dL (ref 3.5–5.0)
Alkaline Phosphatase: 53 U/L (ref 38–126)
Anion gap: 8 (ref 5–15)
BUN: 18 mg/dL (ref 8–23)
CO2: 25 mmol/L (ref 22–32)
Calcium: 9.9 mg/dL (ref 8.9–10.3)
Chloride: 105 mmol/L (ref 98–111)
Creatinine, Ser: 0.76 mg/dL (ref 0.44–1.00)
GFR, Estimated: >60 mL/min (ref >60)
Glucose, Bld: 94 mg/dL (ref 70–99)
Potassium: 4.1 mmol/L (ref 3.5–5.1)
Sodium: 138 mmol/L (ref 135–145)
Total Bilirubin: 0.5 mg/dL (ref 0.3–1.2)
Total Protein: 6.8 g/dL (ref 6.5–8.1)

## 2021-10-18 ENCOUNTER — Other Ambulatory Visit: Payer: Self-pay | Admitting: Internal Medicine

## 2021-10-18 DIAGNOSIS — Z1231 Encounter for screening mammogram for malignant neoplasm of breast: Secondary | ICD-10-CM

## 2021-10-20 ENCOUNTER — Other Ambulatory Visit (INDEPENDENT_AMBULATORY_CARE_PROVIDER_SITE_OTHER): Payer: 59

## 2021-10-20 DIAGNOSIS — I10 Essential (primary) hypertension: Secondary | ICD-10-CM

## 2021-10-20 DIAGNOSIS — Z79899 Other long term (current) drug therapy: Secondary | ICD-10-CM

## 2021-10-20 DIAGNOSIS — Z0000 Encounter for general adult medical examination without abnormal findings: Secondary | ICD-10-CM

## 2021-10-20 DIAGNOSIS — E039 Hypothyroidism, unspecified: Secondary | ICD-10-CM

## 2021-10-20 DIAGNOSIS — E782 Mixed hyperlipidemia: Secondary | ICD-10-CM | POA: Diagnosis not present

## 2021-10-20 LAB — BASIC METABOLIC PANEL
BUN: 25 mg/dL — ABNORMAL HIGH (ref 6–23)
CO2: 27 mEq/L (ref 19–32)
Calcium: 9.9 mg/dL (ref 8.4–10.5)
Chloride: 103 mEq/L (ref 96–112)
Creatinine, Ser: 0.81 mg/dL (ref 0.40–1.20)
GFR: 78.42 mL/min (ref >60.00)
Glucose, Bld: 95 mg/dL (ref 70–99)
Potassium: 4 mEq/L (ref 3.5–5.1)
Sodium: 137 mEq/L (ref 135–145)

## 2021-10-20 LAB — HEMOGLOBIN A1C: Hgb A1c MFr Bld: 5.8 % (ref 4.6–6.5)

## 2021-10-20 LAB — LIPID PANEL
Cholesterol: 201 mg/dL — ABNORMAL HIGH (ref 0–200)
HDL: 56.7 mg/dL (ref >39.00)
LDL Cholesterol: 119 mg/dL — ABNORMAL HIGH (ref 0–99)
NonHDL: 144.66
Total CHOL/HDL Ratio: 4
Triglycerides: 126 mg/dL (ref 0.0–149.0)
VLDL: 25.2 mg/dL (ref 0.0–40.0)

## 2021-10-20 LAB — T4, FREE: Free T4: 1.15 ng/dL (ref 0.60–1.60)

## 2021-10-20 LAB — HEPATIC FUNCTION PANEL
ALT: 13 U/L (ref 0–35)
AST: 15 U/L (ref 0–37)
Albumin: 4.3 g/dL (ref 3.5–5.2)
Alkaline Phosphatase: 49 U/L (ref 39–117)
Bilirubin, Direct: 0.1 mg/dL (ref 0.0–0.3)
Total Bilirubin: 0.4 mg/dL (ref 0.2–1.2)
Total Protein: 6.8 g/dL (ref 6.0–8.3)

## 2021-10-20 LAB — TSH: TSH: 1.4 u[IU]/mL (ref 0.35–5.50)

## 2021-10-20 NOTE — Progress Notes (Signed)
Results stable Will review at upcoming CPX thyroid is in range

## 2021-10-24 NOTE — Progress Notes (Signed)
? ?Chief Complaint  ?Patient presents with  ? Annual Exam  ? ? ?HPI: ?Patient  Christina [name]  61 y.o. comes in today for Preventive Health Care visit And med check  ?No more HA:  see last notes hosp  with rebleeds  no neuro sx  ?Back to work .  ?Now off  sprycel and monitor  to see if helps avoid rebleed. ?Bp ok  no change meds . ?Feet feel cold  at night but no claudication color change not sure cause  no numbness ?Stopped atorvastatin ( placed on in case related to  help with  brain health?)  ?Thought made dry mouth ( but may have been on nortriptyline also no longer taking)  ?Should she take atorva again? ?Over all doing ok   .  ?? Dexa scan?  ?Check skin tag on back  may have scratched off .  ? ?Health Maintenance  ?Topic Date Due  ? PAP SMEAR-Modifier  04/27/2022 (Originally 09/11/2016)  ? COVID-19 Vaccine (1) 04/27/2022 (Originally 01/02/1962)  ? Hepatitis C Screening  04/27/2022 (Originally 07/05/1978)  ? HIV Screening  04/27/2022 (Originally 07/06/1975)  ? MAMMOGRAM  10/23/2022  ? TETANUS/TDAP  04/16/2029  ? COLONOSCOPY (Pts 45-60yr Insurance coverage will need to be confirmed)  08/26/2029  ? INFLUENZA VACCINE  Completed  ? Zoster Vaccines- Shingrix  Completed  ? HPV VACCINES  Aged Out  ? ?Health Maintenance Review ?LIFESTYLE:  ?Exercise:  working  ?Tobacco/ETS:ns ?Alcohol: s ?Sugar beverages:n ?Sleep: ambien ?Drug use: no ?HH of 2  pet ?Work: [employer] ?Getting mammogram tomorrow  ?ROS:  ?REST of 12 system review negative except as per HPI no cv pulm sx  . Headache fever etc.  ? ? ?Past Medical History:  ?Diagnosis Date  ? Allergic rhinitis   ? Arthritis   ? Cleft palate   ? COVID 06/04/2021  ? mild to no symptoms  ? COVID-19 07/31/2019  ? tested positive, states mild aches/pains, fatigue  ? Diarrhea due to drug 06/30/2015  ? Fibroid   ? Grave's disease   ? H/O insomnia   ? H/O menorrhagia 2012  ? Headache   ? History of chicken pox   ? Hypertension   ? Hyperthyroidism   ? s/p rai 8/06 now hypothyroid Dr.  BSuzette Battiest ? Infertility, female   ? [place]   ? Menorrhagia   ? Had Novasure  ? MPN ([place]eloproliferative ne[place]) [place] 05/18/[place]  ? OSA (obstructive sleep apnea) 05/02/2019  ? Polyp of cervix   ? Endometriel polyp. JO  ? Sleep apnea   ? has cpap does not use  ? Vitamin D deficiency   ? ? ?Past Surgical History:  ?Procedure Laterality Date  ? BUNIONECTOMY  1986  ? CRANIOTOMY N/A 06/17/2016  ? Procedure: RIGHT CRANIOTOMY HEMATOMA EVACUATION SUBDURAL;  Surgeon: DEustace Moore MD;  [place] [facility] [place] [place]  Service: Neurosurgery;  Laterality: N/A;  ? CRANIOTOMY Right 06/18/2016  ? Procedure: CRANIOTOMY HEMATOMA EVACUATION SUBDURAL RIGHT;  Surgeon: DEustace Moore MD;  [place] [facility]  Service: Neurosurgery;  Laterality: Right;  ? CRANIOTOMY Left 06/04/2021  ? Procedure: CRANIOTOMY HEMATOMA EVACUATION SUBDURAL;  Surgeon: JEustace Moore MD;  [place]  Service: Neurosurgery;  Laterality: Left;  ? CRANIOTOMY Left 06/27/2021  ? Procedure: LEFT CRANIOTOMY HEMATOMA EVACUATION SUBDURAL;  Surgeon: JEustace Moore MD;  [place]  Service: Neurosurgery;  Laterality: Left;  ? DILATION AND CURETTAGE OF UTERUS  09/15/2010  ? HYSTEROSCOPY WITH D & C  09/28/2010  ? MOUTH SURGERY    ? NOVASURE ABLATION  09/28/2010  ? REFRACTIVE SURGERY    ? for vision  ? TONSILLECTOMY  1965  ? ? ?Family History  ?Problem Relation Age of Onset  ? Diabetes Mother   ? Cancer Mother 25  ?     ovarian ca  ? Breast cancer Mother   ? Hypertension Other   ? Prostate cancer Other   ?     grandfather  ? Hypertension Father   ? Cancer Maternal Uncle   ?     leukemia  ? Colon cancer Neg Hx   ? Esophageal cancer Neg Hx   ? Rectal cancer Neg Hx   ? Stomach cancer Neg Hx   ? Colon polyps Neg Hx   ? ? ?Social History  ? ?Socioeconomic History  ? Marital status: Married  ?  Spouse name: Not on file  ? Number of children: Not on file  ? Years of education: Not on file  ? Highest education level: Not on file  ?Occupational History  ? Not on file  ?Tobacco Use  ?  Smoking status: Never  ? Smokeless tobacco: Never  ?Vaping Use  ? Vaping Use: Never used  ?Substance and Sexual Activity  ? Alcohol use: Yes  ?  Comment: socially  ? Drug use: No  ? Sexual activity: Yes  ?Other Topics Concern  ? Not on file  ?Social History Narrative  ? [place] [employer] working at [employer].  ?   ? Right handed  ? ?Social Determinants of Health  ? ?Financial Resource Strain: Not on file  ?Food Insecurity: Not on file  ?Transportation Needs: Not on file  ?Physical Activity: Not on file  ?Stress: Not on file  ?Social Connections: Not on file  ? ? ?Outpatient Medications Prior to Visit  ?Medication Sig Dispense Refill  ? acetaminophen (TYLENOL) 325 MG tablet Take 650 mg by mouth every 6 (six) hours as needed for moderate pain or headache.    ? losartan (COZAAR) 50 MG tablet Take 1 tablet (50 mg total) by mouth daily. 90 tablet 2  ? metroNIDAZOLE (METROCREAM) 0.75 % cream Apply 1 application topically daily. (Patient taking differently: Apply 1 application. topically daily as needed (dry skin).) 45 g 3  ? SYNTHROID 175 MCG tablet Take 1 tablet (175 mcg total) by mouth daily before breakfast. 90 tablet 0  ? tretinoin (RETIN-A) 0.025 % cream Apply a pearl-sized amount to face in the evening once a day 45 g 3  ? verapamil (CALAN-SR) 240 MG CR tablet TAKE 1 TABLET BY MOUTH AT BEDTIME. 90 tablet 3  ? zolpidem (AMBIEN) 10 MG tablet TAKE 1 TABLET BY MOUTH AT BEDTIME AS NEEDED FOR SLEEP 90 tablet 0  ? HYDROcodone-acetaminophen (NORCO/VICODIN) 5-325 MG tablet Take 1 tablet by mouth every 4 (four) hours as needed for moderate pain. 30 tablet 0  ? levETIRAcetam (KEPPRA) 500 MG tablet Take 1 tablet (500 mg total) by mouth every 12 (twelve) hours. 30 tablet 0  ? levETIRAcetam (KEPPRA) 500 MG tablet Take 1 tablet (500 mg total) by mouth every 12 (twelve) hours. 60 tablet 0  ? nortriptyline (PAMELOR) 25 MG capsule Take 1 capsule (25 mg total) by mouth at bedtime. 30 capsule 3  ? atorvastatin (LIPITOR)  20 MG tablet Take 1 tablet (20 mg total) by mouth daily. (Patient not taking: Reported on 10/25/2021) 30 tablet 1  ? ?No facility-administered medications prior to visit.  ? ? ? ?  EXAM: ? ?BP 124/66 (BP Location: Left Arm, Patient Position: Sitting, Cuff Size: Normal)   Pulse 67   Temp 97.8 ?F (36.6 ?C) (Oral)   Ht '5\' 8"'$  (1.727 m)   Wt 167 lb 9.6 oz (76 kg)   SpO2 99%   BMI 25.48 kg/m?  ? ?Body mass index is 25.48 kg/m?. ?Wt Readings from Last 3 Encounters:  ?10/25/21 167 lb 9.6 oz (76 kg)  ?09/17/21 169 lb 12.1 oz (77 kg)  ?08/19/21 168 lb 8 oz (76.4 kg)  ? ? ?Physical Exam: ?Vital signs reviewed ?VWU:JWJX is a well-developed well-nourished alert cooperative    who appearsr stated age in no acute distress.  ?HEENT: normocephalic atraumatic , Eyes: PERRL EOM's full, conjunctiva clear, Nares: paten,t no deformity discharge or tenderness.masked NECK: supple without masses, thyromegaly or bruits. ?CHEST/PULM:  Clear to auscultation and percussion breath sounds equal no wheeze , rales or rhonchi. No chest wall deformities or tenderness. ?Breast: deferred ?CV: PMI is nondisplaced, S1 S2 no gallops, murmurs, rubs. Peripheral pulses are full without delay.No JVD .  ?ABDOMEN: Bowel sounds normal nontender  No guard or rebound, no hepato splenomegal no CVA tenderness.  No hernia. ?Extremtities:  No clubbing cyanosis or edema, no acute joint swelling or redness no focal atrophy ?NEURO:  Oriented x3, cranial nerves 3-12 appear to be intact, no obvious focal weakness,gait within normal limits no abnormal reflexes or asymmetrical ?SKIN: No acute rashes normal turgor, color, no bruising or petechiae. No skin tag some dry skin  and 2 mm mole upper back benign features  ?PSYCH: Oriented, good eye contact, no obvious depression anxiety, cognition and judgment appear normal. ?LN: no cervical axillaryadenopathy ? ?Lab Results  ?Component Value Date  ? WBC 5.2 10/15/2021  ? HGB 13.6 10/15/2021  ? HCT 39.9 10/15/2021  ? PLT 281  10/15/2021  ? GLUCOSE 95 10/20/2021  ? CHOL 201 (H) 10/20/2021  ? TRIG 126.0 10/20/2021  ? HDL 56.70 10/20/2021  ? LDLDIRECT 162.0 04/24/2019  ? LDLCALC 119 (H) 10/20/2021  ? ALT 13 10/20/2021  ? AST 15 03/15/202

## 2021-10-25 ENCOUNTER — Encounter: Payer: Self-pay | Admitting: Internal Medicine

## 2021-10-25 ENCOUNTER — Telehealth: Payer: Self-pay | Admitting: Hematology and Oncology

## 2021-10-25 ENCOUNTER — Ambulatory Visit (INDEPENDENT_AMBULATORY_CARE_PROVIDER_SITE_OTHER): Payer: 59 | Admitting: Internal Medicine

## 2021-10-25 VITALS — BP 124/66 | HR 67 | Temp 97.8°F | Ht 68.0 in | Wt 167.6 lb

## 2021-10-25 DIAGNOSIS — E039 Hypothyroidism, unspecified: Secondary | ICD-10-CM

## 2021-10-25 DIAGNOSIS — Z0000 Encounter for general adult medical examination without abnormal findings: Secondary | ICD-10-CM

## 2021-10-25 DIAGNOSIS — Z9189 Other specified personal risk factors, not elsewhere classified: Secondary | ICD-10-CM

## 2021-10-25 DIAGNOSIS — I10 Essential (primary) hypertension: Secondary | ICD-10-CM

## 2021-10-25 DIAGNOSIS — C9210 Chronic myeloid leukemia, BCR/ABL-positive, not having achieved remission: Secondary | ICD-10-CM

## 2021-10-25 DIAGNOSIS — Z9889 Other specified postprocedural states: Secondary | ICD-10-CM | POA: Diagnosis not present

## 2021-10-25 DIAGNOSIS — E782 Mixed hyperlipidemia: Secondary | ICD-10-CM

## 2021-10-25 DIAGNOSIS — Z8679 Personal history of other diseases of the circulatory system: Secondary | ICD-10-CM

## 2021-10-25 DIAGNOSIS — Z79899 Other long term (current) drug therapy: Secondary | ICD-10-CM

## 2021-10-25 DIAGNOSIS — E2839 Other primary ovarian failure: Secondary | ICD-10-CM

## 2021-10-25 LAB — BCR/ABL

## 2021-10-25 NOTE — Telephone Encounter (Signed)
Test results are related to the patient's husband ?BCR/ABL remains undetectable ?Continue close monitoring of blood test ?

## 2021-10-25 NOTE — Patient Instructions (Addendum)
Good to see you today . ? ?Get  updated PAP  every 3-5 years until about age 62 +  ?Will order  Dexa scan  bone density at  breast center .  ? ?No change meds at this time  ? ?Consider  getting self pay  coronary ct scan  for risk assessment .  To decide on  restarting cholesterol medication.  ? ?The 10-year ASCVD risk score (Arnett DK, et al., 2019) is: 4.5% ?  Values used to calculate the score: ?    Age: 4 years ?    Sex: Female ?    Is Non-Hispanic African American: No ?    Diabetic: No ?    Tobacco smoker: No ?    Systolic Blood Pressure: 408 mmHg ?    Is BP treated: Yes ?    HDL Cholesterol: 56.7 mg/dL ?    Total Cholesterol: 201 mg/dL ? ? ? ? ? ? ? ?

## 2021-10-26 ENCOUNTER — Ambulatory Visit
Admission: RE | Admit: 2021-10-26 | Discharge: 2021-10-26 | Disposition: A | Discharge status: Home / Self Care | Payer: 59 | Source: Ambulatory Visit | Attending: Internal Medicine | Admitting: Internal Medicine

## 2021-10-26 DIAGNOSIS — Z1231 Encounter for screening mammogram for malignant neoplasm of breast: Secondary | ICD-10-CM

## 2021-10-26 IMAGING — MG MM DIGITAL SCREENING BILAT W/ TOMO AND CAD
8 series · 8 of 24 positions shown · non-contrast
Comparison: Previous exam(s).

CLINICAL DATA: Screening.

EXAM:
DIGITAL SCREENING BILATERAL MAMMOGRAM WITH TOMOSYNTHESIS AND CAD
TECHNIQUE: Bilateral screening digital craniocaudal and mediolateral oblique
mammograms were obtained. Bilateral screening digital breast
tomosynthesis was performed. The images were evaluated with
computer-aided detection.

[R MLO synth-2D]
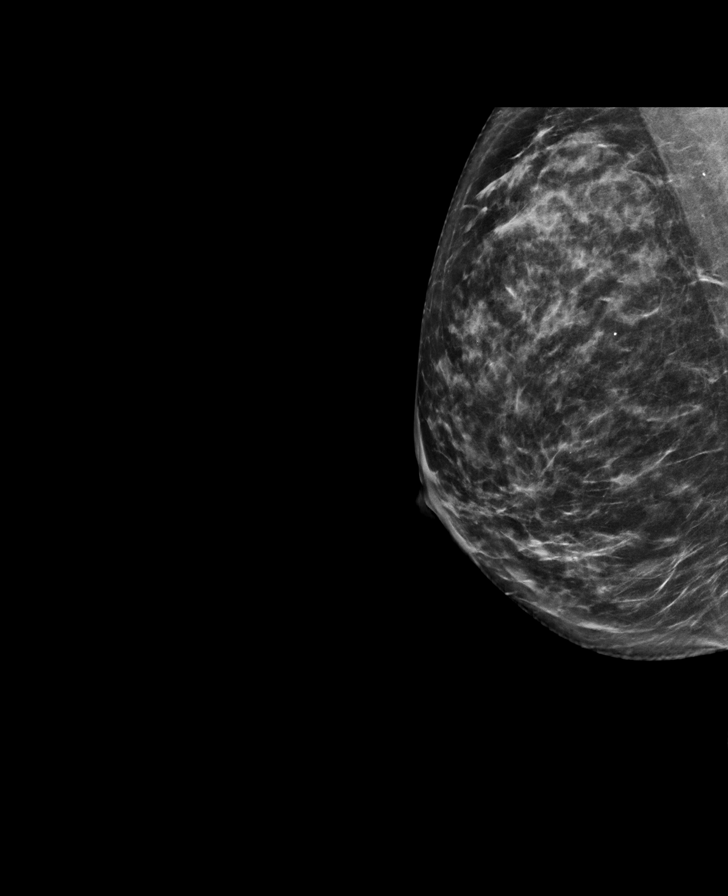

[R CC synth-2D]
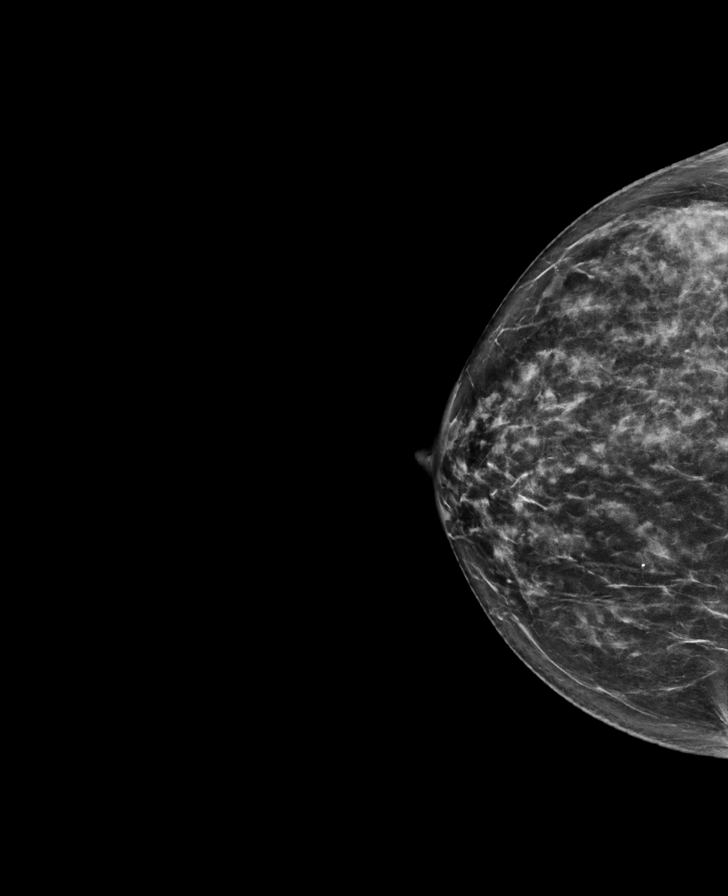

[L CC synth-2D]
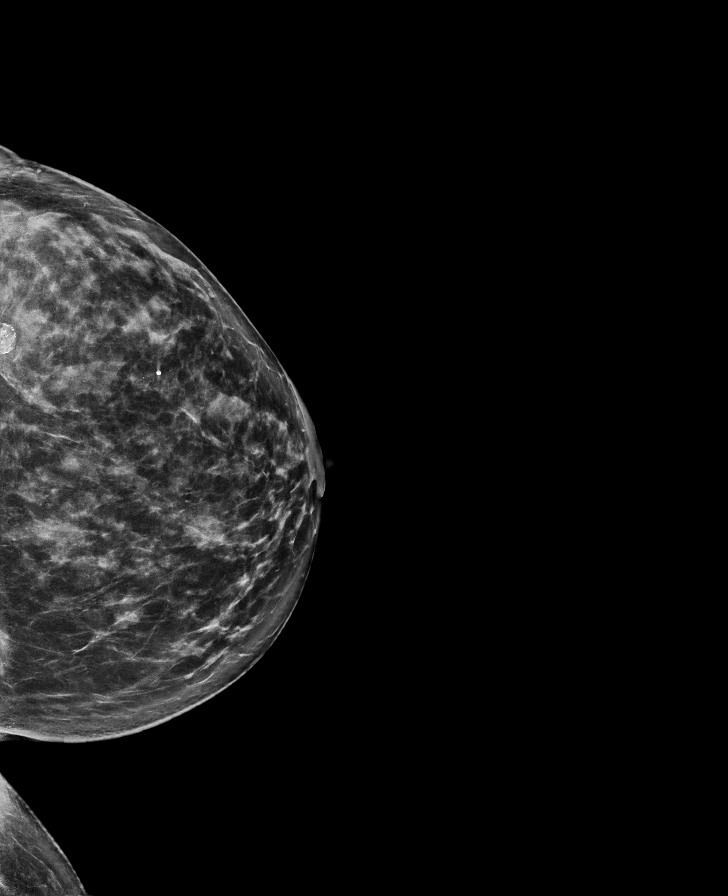

[L MLO synth-2D]
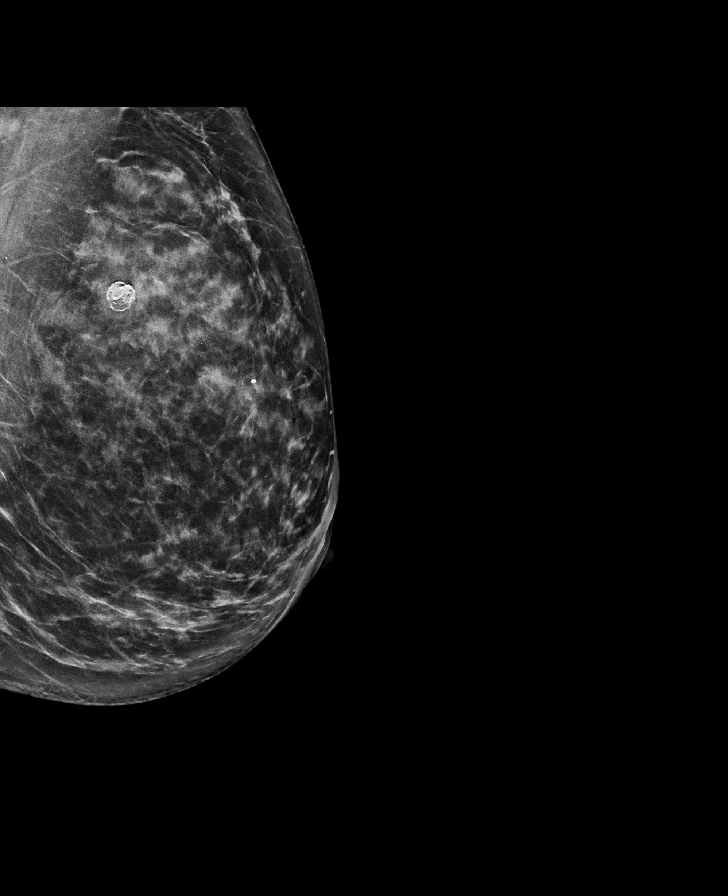

[R CC tomo · tomo slice 37/74.0]
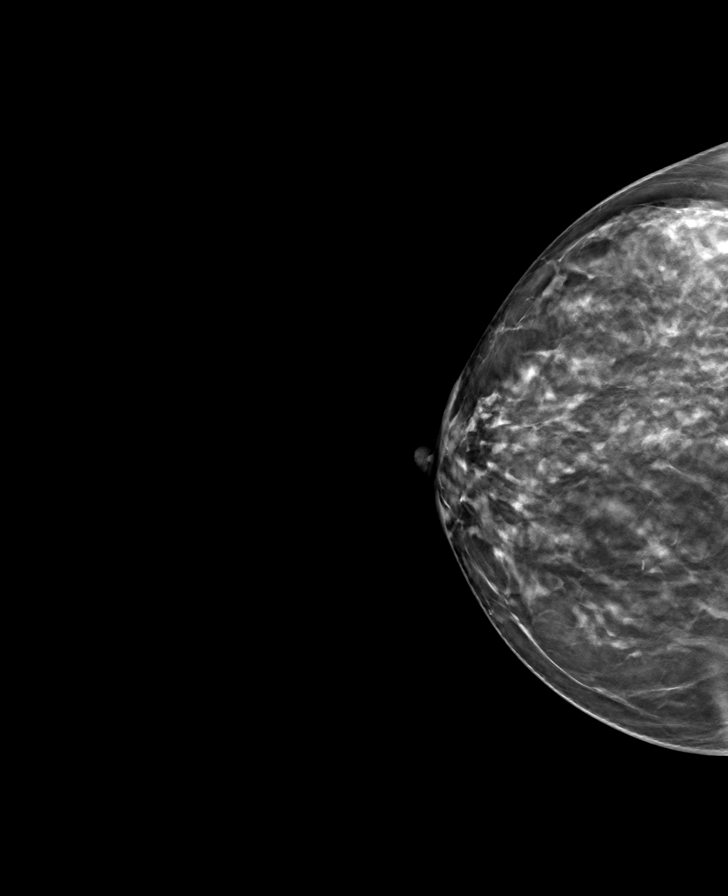

[L CC tomo · tomo slice 41/80.0]
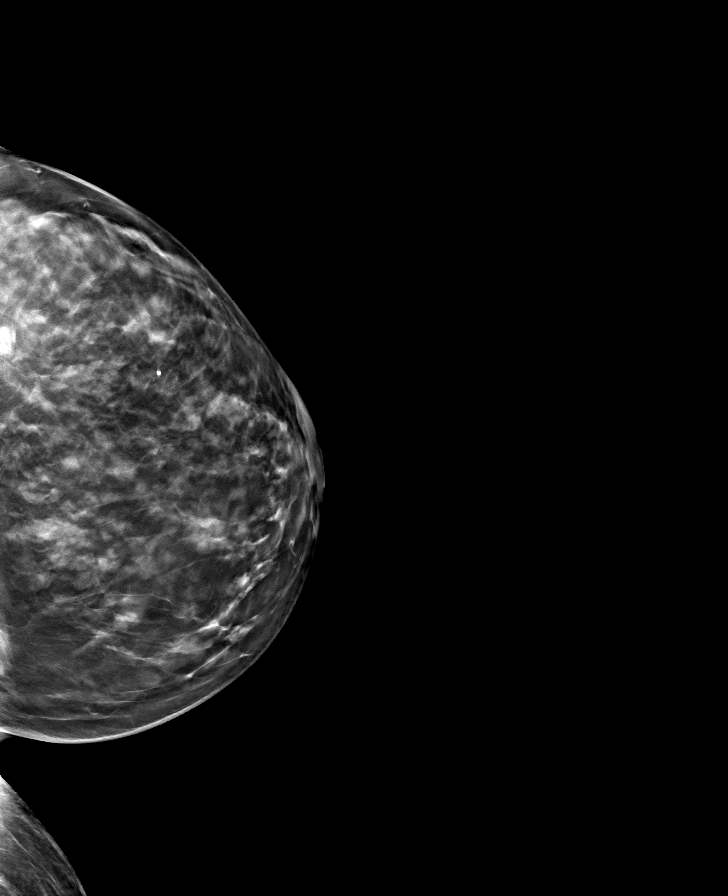

[R MLO tomo · tomo slice 37/74.0]
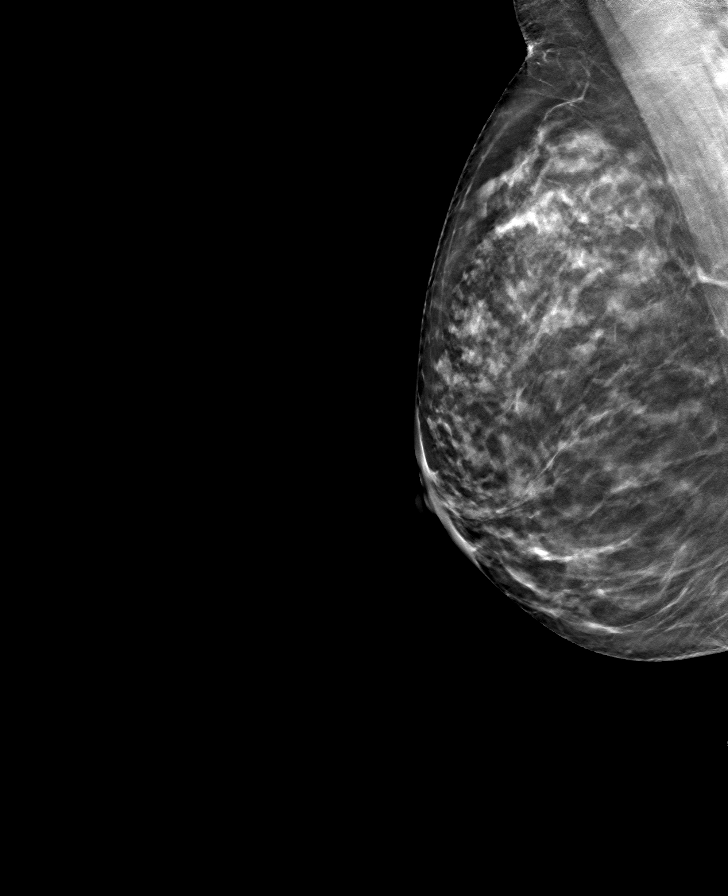

[L MLO tomo · tomo slice 38/75.0]
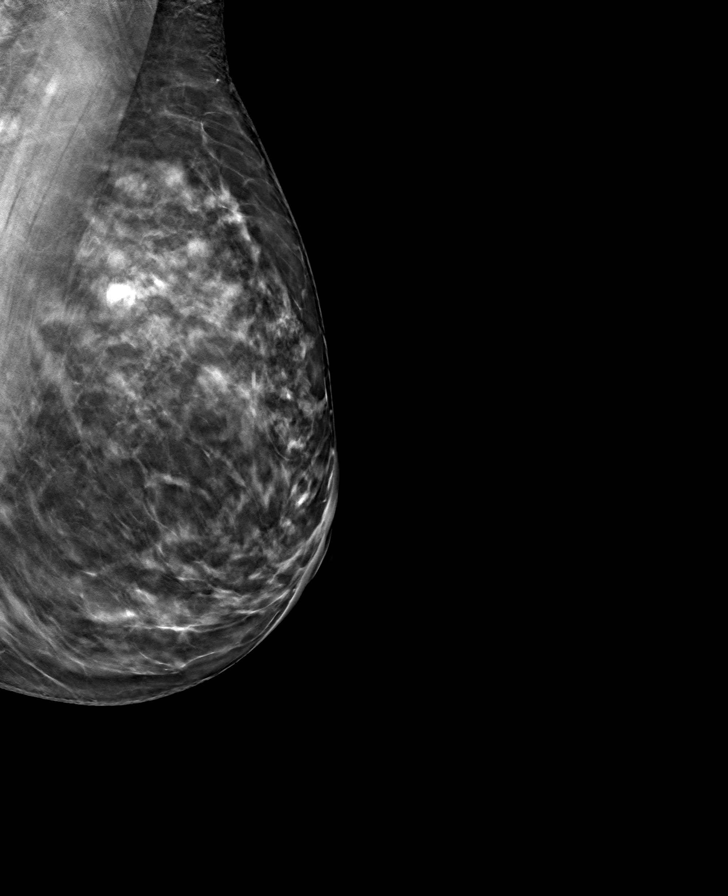

[8 of 24 positions shown; findings below may reference images not displayed]

ACR Breast Density Category c: The breast tissue is heterogeneously
dense, which may obscure small masses.
FINDINGS: There are no findings suspicious for malignancy.
IMPRESSION: No mammographic evidence of malignancy. A result letter of this
screening mammogram will be mailed directly to the patient.

RECOMMENDATION:
Screening mammogram in one year. (Code:[V2])

BI-RADS CATEGORY  1: Negative.

## 2021-11-01 ENCOUNTER — Encounter: Payer: 59 | Admitting: Internal Medicine

## 2021-11-12 ENCOUNTER — Other Ambulatory Visit: Payer: Self-pay

## 2021-11-12 ENCOUNTER — Inpatient Hospital Stay: Payer: 59 | Attending: Hematology and Oncology

## 2021-11-12 ENCOUNTER — Other Ambulatory Visit: Payer: 59

## 2021-11-12 DIAGNOSIS — C9210 Chronic myeloid leukemia, BCR/ABL-positive, not having achieved remission: Secondary | ICD-10-CM | POA: Insufficient documentation

## 2021-11-12 LAB — CBC WITH DIFFERENTIAL/PLATELET
Abs Immature Granulocytes: 0.01 10*3/uL (ref 0.00–0.07)
Basophils Absolute: 0.1 10*3/uL (ref 0.0–0.1)
Basophils Relative: 1 %
Eosinophils Absolute: 0.3 10*3/uL (ref 0.0–0.5)
Eosinophils Relative: 6 %
HCT: 38.7 % (ref 36.0–46.0)
Hemoglobin: 13.6 g/dL (ref 12.0–15.0)
Immature Granulocytes: 0 %
Lymphocytes Relative: 27 %
Lymphs Abs: 1.2 10*3/uL (ref 0.7–4.0)
MCH: 30.6 pg (ref 26.0–34.0)
MCHC: 35.1 g/dL (ref 30.0–36.0)
MCV: 87.2 fL (ref 80.0–100.0)
Monocytes Absolute: 0.5 10*3/uL (ref 0.1–1.0)
Monocytes Relative: 10 %
Neutro Abs: 2.6 10*3/uL (ref 1.7–7.7)
Neutrophils Relative %: 56 %
Platelets: 271 10*3/uL (ref 150–400)
RBC: 4.44 MIL/uL (ref 3.87–5.11)
RDW: 12.7 % (ref 11.5–15.5)
WBC: 4.6 10*3/uL (ref 4.0–10.5)
nRBC: 0 % (ref 0.0–0.2)

## 2021-11-12 LAB — COMPREHENSIVE METABOLIC PANEL
ALT: 13 U/L (ref 0–44)
AST: 14 U/L — ABNORMAL LOW (ref 15–41)
Albumin: 4.1 g/dL (ref 3.5–5.0)
Alkaline Phosphatase: 53 U/L (ref 38–126)
Anion gap: 8 (ref 5–15)
BUN: 21 mg/dL (ref 8–23)
CO2: 24 mmol/L (ref 22–32)
Calcium: 9.7 mg/dL (ref 8.9–10.3)
Chloride: 106 mmol/L (ref 98–111)
Creatinine, Ser: 0.78 mg/dL (ref 0.44–1.00)
GFR, Estimated: >60 mL/min (ref >60)
Glucose, Bld: 106 mg/dL — ABNORMAL HIGH (ref 70–99)
Potassium: 3.8 mmol/L (ref 3.5–5.1)
Sodium: 138 mmol/L (ref 135–145)
Total Bilirubin: 0.5 mg/dL (ref 0.3–1.2)
Total Protein: 7.2 g/dL (ref 6.5–8.1)

## 2021-11-16 ENCOUNTER — Ambulatory Visit (HOSPITAL_BASED_OUTPATIENT_CLINIC_OR_DEPARTMENT_OTHER)
Admission: RE | Admit: 2021-11-16 | Discharge: 2021-11-16 | Disposition: A | Discharge status: Home / Self Care | Payer: 59 | Source: Ambulatory Visit | Attending: Neurological Surgery | Admitting: Neurological Surgery

## 2021-11-16 DIAGNOSIS — S80212A Abrasion, left knee, initial encounter: Secondary | ICD-10-CM | POA: Diagnosis not present

## 2021-11-16 DIAGNOSIS — I6200 Nontraumatic subdural hemorrhage, unspecified: Secondary | ICD-10-CM | POA: Diagnosis not present

## 2021-11-16 DIAGNOSIS — M67813 Other specified disorders of tendon, right shoulder: Secondary | ICD-10-CM | POA: Diagnosis not present

## 2021-11-16 DIAGNOSIS — S065XAA Traumatic subdural hemorrhage with loss of consciousness status unknown, initial encounter: Secondary | ICD-10-CM | POA: Diagnosis not present

## 2021-11-16 DIAGNOSIS — S065X0A Traumatic subdural hemorrhage without loss of consciousness, initial encounter: Secondary | ICD-10-CM | POA: Diagnosis not present

## 2021-11-16 IMAGING — CT CT HEAD W/O CM
4 series · 16 of 47 positions shown, 18 images · non-contrast
Comparison: Brain MRI [DATE] and earlier.

CLINICAL DATA: 60-year-old female with history of bilateral
subdural hematomas, craniotomy most recently in [REDACTED].



[Series 2: head wo · axial · 0.51mm/px · z∈[+843,+968]mm · 7 of 35 slices shown, 9 images]
[im 5/35  brain]
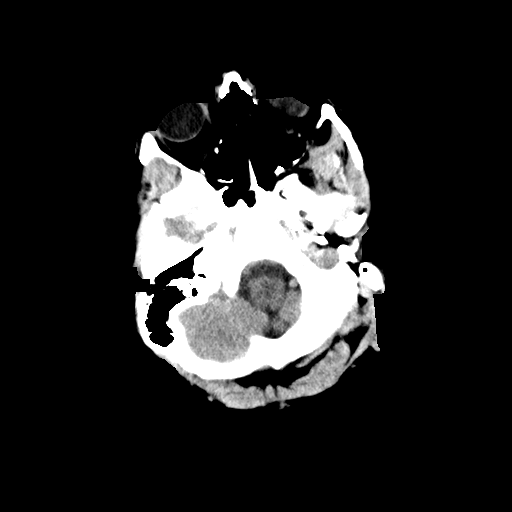
[im 5/35  bone]
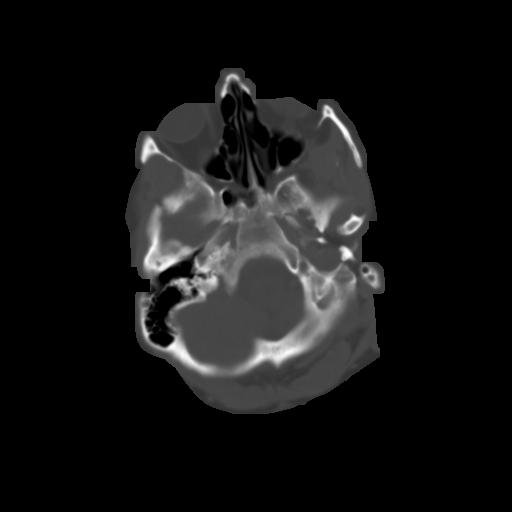
[im 9/35  brain]
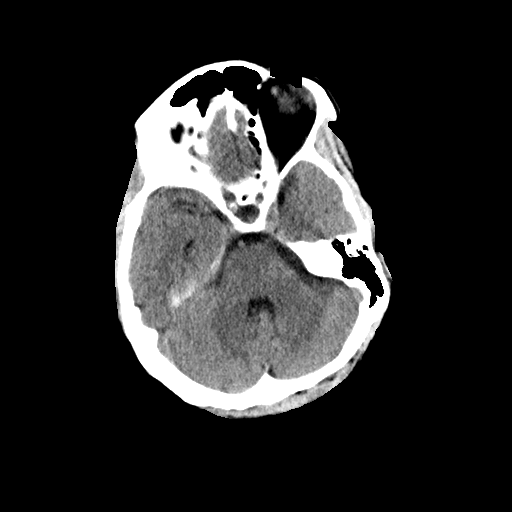
[im 13/35  brain]
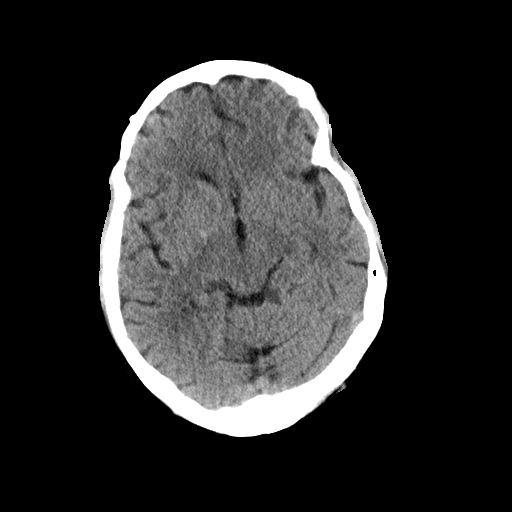
[im 18/35  brain]
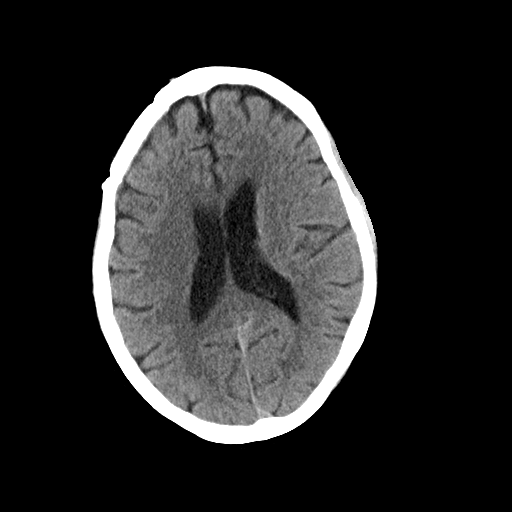
[im 22/35  brain]
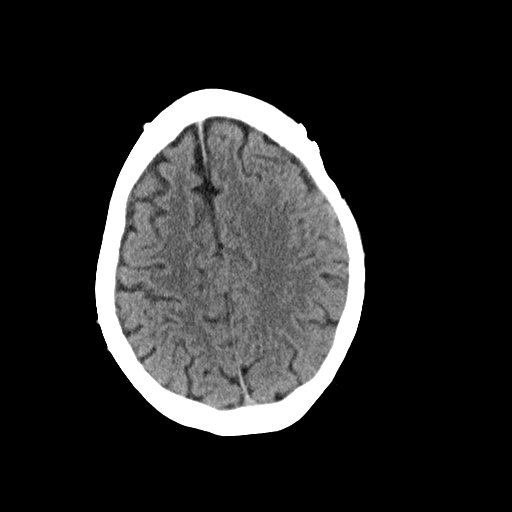
[im 22/35  bone]
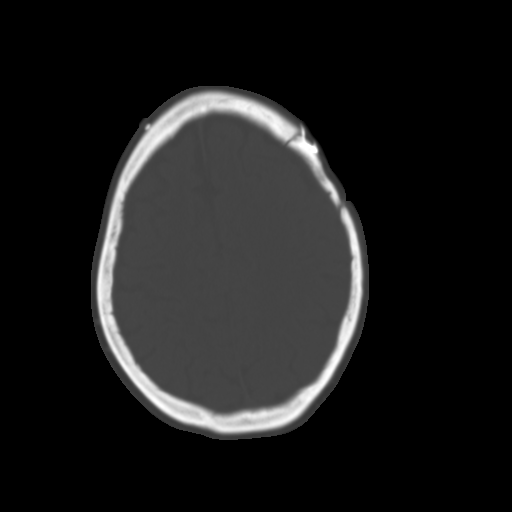
[im 26/35  brain]
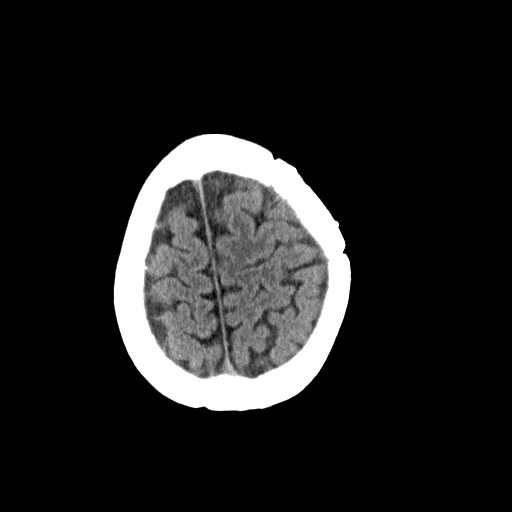
[im 30/35  brain]
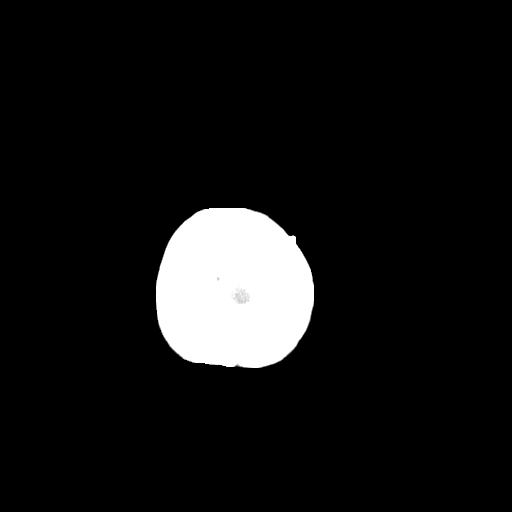

[Series 3: head bone · axial · 0.51mm/px · z∈[+839,+873]mm · 3 of 87 slices shown]
[im 9/87  bone]
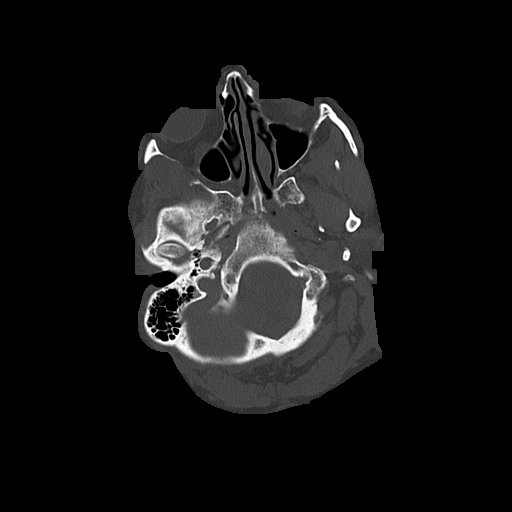
[im 18/87  bone]
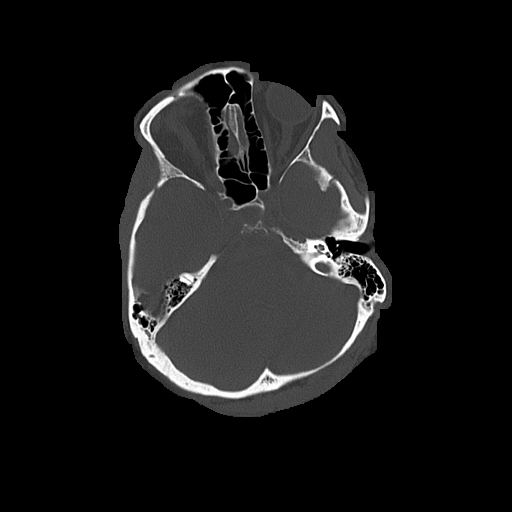
[im 26/87  bone]
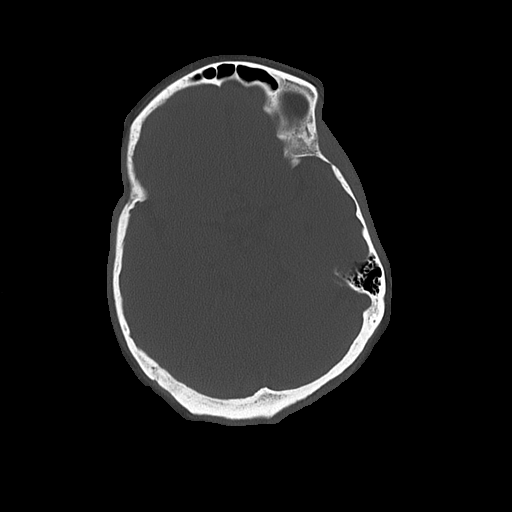

[Series 4: coronal soft · coronal · 0.34mm/px · 3 of 71 slices shown]
[im 24/71  brain]
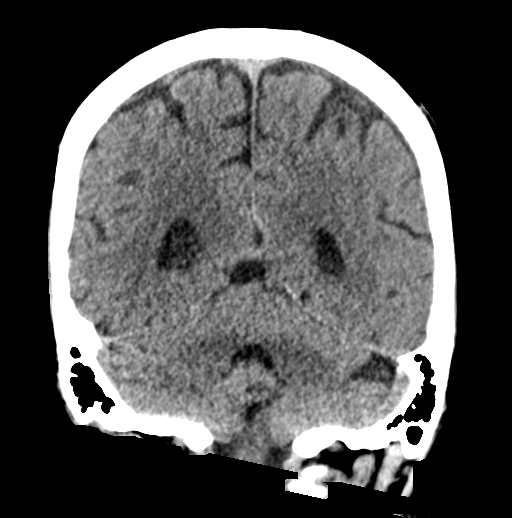
[im 32/71  brain]
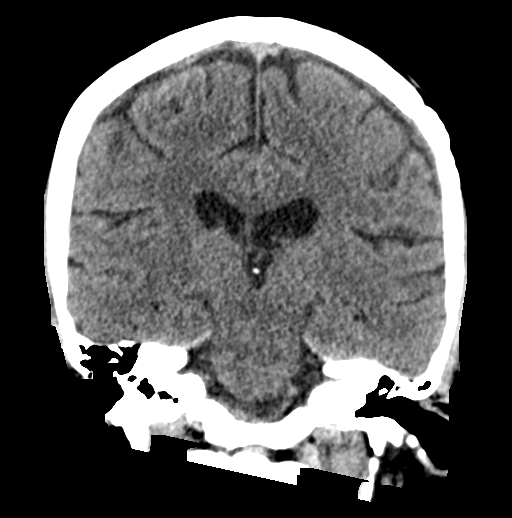
[im 39/71  brain]
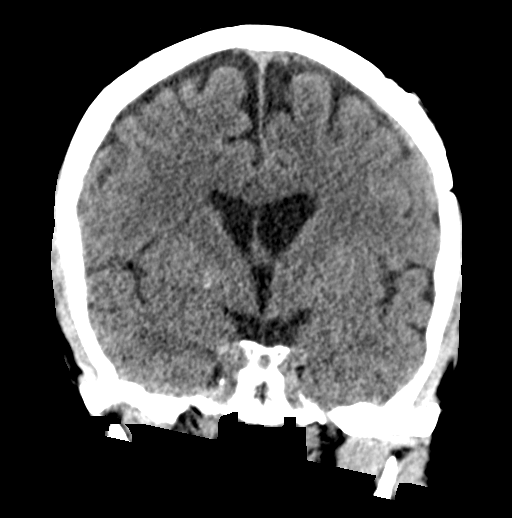

[Series 5: sagittal soft · sagittal · 0.35mm/px · 3 of 59 slices shown]
[im 25/59  brain]
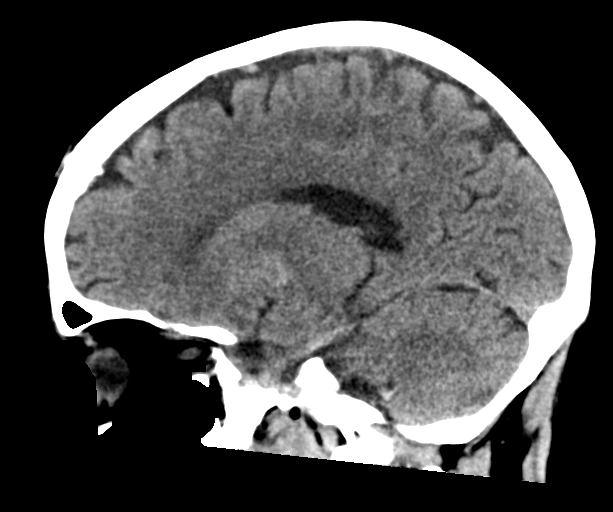
[im 30/59  brain]
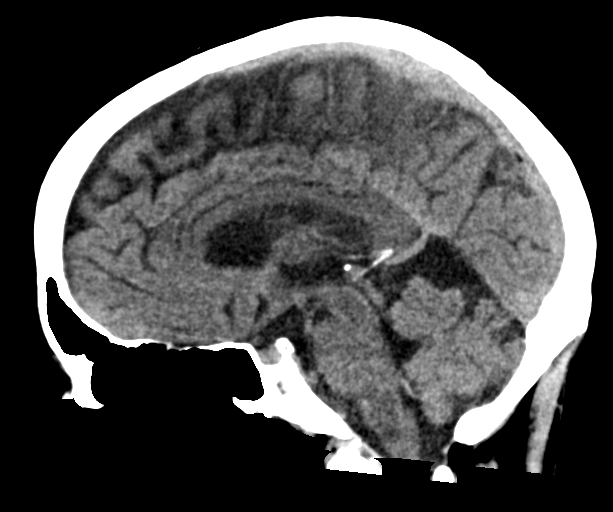
[im 34/59  brain]
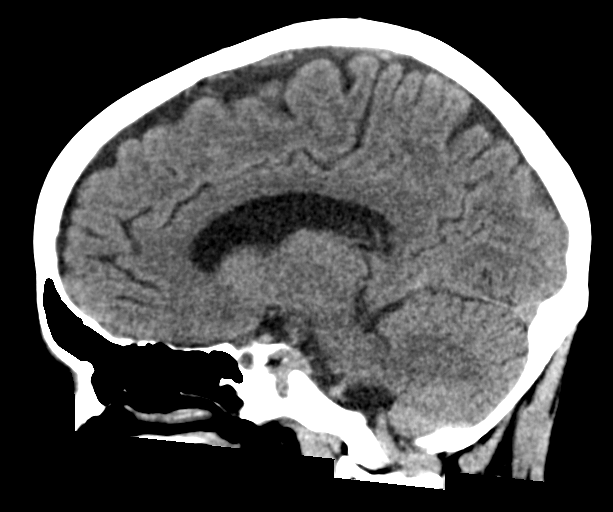

[16 of 47 positions shown; findings below may reference images not displayed]

FINDINGS: Brain: Small residual left subdural collection likely is
superimposed on mild dural thickening due to previous craniotomy and
measures 2-3 mm in thickness (coronal image 32), decreased since
[REDACTED]. Similar trace contralateral mostly superior convexity low
to intermediate density subdural collection is stable to 4 mm
(coronal image 30).

No midline shift. Normal basilar cisterns. No new intracranial
hemorrhage. No ventriculomegaly. Stable gray-white matter
differentiation throughout the brain. No acute or chronic infarct
identified.

Vascular: Mild Calcified atherosclerosis at the skull base.

Skull: Stable previous bilateral craniotomy. No acute osseous
abnormality identified.

Sinuses/Orbits: Visualized paranasal sinuses and mastoids are stable
and well aerated. Partially visible left maxillary postoperative
changes.

Other: No acute orbit or scalp soft tissue finding.
IMPRESSION: 1. Previous bilateral craniotomy. Small residual bilateral subdural
collections, stable on the right (3-4 mm) and decreased on the left
(2-3 mm) since [REDACTED]. No significant intracranial mass effect.
2. No new intracranial abnormality.

## 2021-11-18 DIAGNOSIS — I6200 Nontraumatic subdural hemorrhage, unspecified: Secondary | ICD-10-CM | POA: Diagnosis not present

## 2021-11-18 LAB — BCR/ABL

## 2021-11-19 ENCOUNTER — Telehealth: Payer: Self-pay

## 2021-11-19 NOTE — Telephone Encounter (Signed)
-----   Message from Heath Lark, MD sent at 11/19/2021  8:52 AM EDT ----- ?Pls call her ?She is still in remission, BCR/ABL is not detected ? ?

## 2021-11-19 NOTE — Telephone Encounter (Signed)
Called and given below message. Her husband verbalized understanding. 

## 2021-11-22 ENCOUNTER — Other Ambulatory Visit (HOSPITAL_COMMUNITY): Payer: Self-pay

## 2021-11-22 ENCOUNTER — Other Ambulatory Visit: Payer: Self-pay

## 2021-11-22 MED ORDER — CEPHALEXIN 500 MG PO CAPS
500.0000 mg | ORAL_CAPSULE | Freq: Three times a day (TID) | ORAL | 0 refills | Status: DC
  Filled 2021-11-22: qty 21, 7d supply, fill #0

## 2021-11-22 MED ORDER — ZOLPIDEM TARTRATE 10 MG PO TABS
10.0000 mg | ORAL_TABLET | Freq: Every evening | ORAL | 0 refills | Status: DC | PRN
Start: 2021-11-22 — End: 2022-02-23
  Filled 2021-11-22 – 2021-11-30 (×2): qty 90, 90d supply, fill #0

## 2021-11-22 MED ORDER — ATORVASTATIN CALCIUM 10 MG PO TABS
10.0000 mg | ORAL_TABLET | Freq: Every day | ORAL | 3 refills | Status: DC
  Filled 2021-11-22: qty 90, 90d supply, fill #0

## 2021-11-22 NOTE — Telephone Encounter (Signed)
Request refill of Ambien ?Also  atorvastatin  10 mg in case at the possible to stabilize and decrease the risk of CNS rebleeding per neurosurgeon.  See previous notes. ? ?Also has a mild paronychia toe infection request antibiotics and will soak Keflex. ?

## 2021-11-24 ENCOUNTER — Other Ambulatory Visit (HOSPITAL_COMMUNITY): Payer: Self-pay

## 2021-11-30 ENCOUNTER — Other Ambulatory Visit: Payer: Self-pay | Admitting: Internal Medicine

## 2021-11-30 ENCOUNTER — Other Ambulatory Visit (HOSPITAL_COMMUNITY): Payer: Self-pay

## 2021-11-30 MED ORDER — SYNTHROID 175 MCG PO TABS
175.0000 ug | ORAL_TABLET | Freq: Every day | ORAL | 0 refills | Status: DC
  Filled 2021-11-30: qty 90, 90d supply, fill #0

## 2021-12-01 ENCOUNTER — Other Ambulatory Visit: Payer: 59

## 2021-12-10 ENCOUNTER — Inpatient Hospital Stay: Payer: 59 | Attending: Hematology and Oncology

## 2021-12-10 ENCOUNTER — Other Ambulatory Visit: Payer: 59

## 2021-12-10 ENCOUNTER — Other Ambulatory Visit: Payer: Self-pay

## 2021-12-10 DIAGNOSIS — C9210 Chronic myeloid leukemia, BCR/ABL-positive, not having achieved remission: Secondary | ICD-10-CM | POA: Diagnosis not present

## 2021-12-10 LAB — CBC WITH DIFFERENTIAL/PLATELET
Abs Immature Granulocytes: 0.02 10*3/uL (ref 0.00–0.07)
Basophils Absolute: 0.1 10*3/uL (ref 0.0–0.1)
Basophils Relative: 1 %
Eosinophils Absolute: 0.3 10*3/uL (ref 0.0–0.5)
Eosinophils Relative: 6 %
HCT: 38.7 % (ref 36.0–46.0)
Hemoglobin: 13.3 g/dL (ref 12.0–15.0)
Immature Granulocytes: 0 %
Lymphocytes Relative: 27 %
Lymphs Abs: 1.4 10*3/uL (ref 0.7–4.0)
MCH: 30.4 pg (ref 26.0–34.0)
MCHC: 34.4 g/dL (ref 30.0–36.0)
MCV: 88.6 fL (ref 80.0–100.0)
Monocytes Absolute: 0.6 10*3/uL (ref 0.1–1.0)
Monocytes Relative: 11 %
Neutro Abs: 2.8 10*3/uL (ref 1.7–7.7)
Neutrophils Relative %: 55 %
Platelets: 254 10*3/uL (ref 150–400)
RBC: 4.37 MIL/uL (ref 3.87–5.11)
RDW: 12.8 % (ref 11.5–15.5)
WBC: 5.1 10*3/uL (ref 4.0–10.5)
nRBC: 0 % (ref 0.0–0.2)

## 2021-12-10 LAB — COMPREHENSIVE METABOLIC PANEL
ALT: 15 U/L (ref 0–44)
AST: 15 U/L (ref 15–41)
Albumin: 4.2 g/dL (ref 3.5–5.0)
Alkaline Phosphatase: 52 U/L (ref 38–126)
Anion gap: 8 (ref 5–15)
BUN: 21 mg/dL (ref 8–23)
CO2: 24 mmol/L (ref 22–32)
Calcium: 9.8 mg/dL (ref 8.9–10.3)
Chloride: 106 mmol/L (ref 98–111)
Creatinine, Ser: 0.78 mg/dL (ref 0.44–1.00)
GFR, Estimated: >60 mL/min (ref >60)
Glucose, Bld: 100 mg/dL — ABNORMAL HIGH (ref 70–99)
Potassium: 4 mmol/L (ref 3.5–5.1)
Sodium: 138 mmol/L (ref 135–145)
Total Bilirubin: 0.5 mg/dL (ref 0.3–1.2)
Total Protein: 7.2 g/dL (ref 6.5–8.1)

## 2021-12-16 LAB — BCR/ABL

## 2022-01-04 ENCOUNTER — Other Ambulatory Visit (HOSPITAL_COMMUNITY): Payer: Self-pay

## 2022-01-05 ENCOUNTER — Inpatient Hospital Stay: Admission: RE | Admit: 2022-01-05 | Payer: 59 | Source: Ambulatory Visit

## 2022-01-07 ENCOUNTER — Inpatient Hospital Stay: Payer: 59

## 2022-01-10 ENCOUNTER — Other Ambulatory Visit: Payer: Self-pay

## 2022-01-10 ENCOUNTER — Inpatient Hospital Stay: Payer: 59 | Attending: Hematology and Oncology

## 2022-01-10 DIAGNOSIS — C9210 Chronic myeloid leukemia, BCR/ABL-positive, not having achieved remission: Secondary | ICD-10-CM | POA: Diagnosis not present

## 2022-01-10 LAB — CBC WITH DIFFERENTIAL/PLATELET
Abs Immature Granulocytes: 0.02 10*3/uL (ref 0.00–0.07)
Basophils Absolute: 0.1 10*3/uL (ref 0.0–0.1)
Basophils Relative: 1 %
Eosinophils Absolute: 0.2 10*3/uL (ref 0.0–0.5)
Eosinophils Relative: 5 %
HCT: 38.4 % (ref 36.0–46.0)
Hemoglobin: 13.4 g/dL (ref 12.0–15.0)
Immature Granulocytes: 0 %
Lymphocytes Relative: 26 %
Lymphs Abs: 1.3 10*3/uL (ref 0.7–4.0)
MCH: 30.7 pg (ref 26.0–34.0)
MCHC: 34.9 g/dL (ref 30.0–36.0)
MCV: 87.9 fL (ref 80.0–100.0)
Monocytes Absolute: 0.5 10*3/uL (ref 0.1–1.0)
Monocytes Relative: 10 %
Neutro Abs: 2.8 10*3/uL (ref 1.7–7.7)
Neutrophils Relative %: 58 %
Platelets: 255 10*3/uL (ref 150–400)
RBC: 4.37 MIL/uL (ref 3.87–5.11)
RDW: 12.9 % (ref 11.5–15.5)
WBC: 4.9 10*3/uL (ref 4.0–10.5)
nRBC: 0 % (ref 0.0–0.2)

## 2022-01-10 LAB — COMPREHENSIVE METABOLIC PANEL
ALT: 15 U/L (ref 0–44)
AST: 15 U/L (ref 15–41)
Albumin: 4.3 g/dL (ref 3.5–5.0)
Alkaline Phosphatase: 55 U/L (ref 38–126)
Anion gap: 8 (ref 5–15)
BUN: 25 mg/dL — ABNORMAL HIGH (ref 8–23)
CO2: 24 mmol/L (ref 22–32)
Calcium: 10.1 mg/dL (ref 8.9–10.3)
Chloride: 106 mmol/L (ref 98–111)
Creatinine, Ser: 0.78 mg/dL (ref 0.44–1.00)
GFR, Estimated: >60 mL/min (ref >60)
Glucose, Bld: 101 mg/dL — ABNORMAL HIGH (ref 70–99)
Potassium: 4.1 mmol/L (ref 3.5–5.1)
Sodium: 138 mmol/L (ref 135–145)
Total Bilirubin: 0.3 mg/dL (ref 0.3–1.2)
Total Protein: 6.9 g/dL (ref 6.5–8.1)

## 2022-01-17 LAB — BCR/ABL

## 2022-02-04 ENCOUNTER — Other Ambulatory Visit: Payer: 59

## 2022-02-09 ENCOUNTER — Other Ambulatory Visit: Payer: Self-pay

## 2022-02-09 ENCOUNTER — Inpatient Hospital Stay: Payer: 59 | Attending: Hematology and Oncology

## 2022-02-09 DIAGNOSIS — C9210 Chronic myeloid leukemia, BCR/ABL-positive, not having achieved remission: Secondary | ICD-10-CM | POA: Insufficient documentation

## 2022-02-09 DIAGNOSIS — Z8679 Personal history of other diseases of the circulatory system: Secondary | ICD-10-CM | POA: Diagnosis not present

## 2022-02-09 DIAGNOSIS — Z79899 Other long term (current) drug therapy: Secondary | ICD-10-CM | POA: Insufficient documentation

## 2022-02-09 DIAGNOSIS — Z79890 Hormone replacement therapy (postmenopausal): Secondary | ICD-10-CM | POA: Insufficient documentation

## 2022-02-09 LAB — CBC WITH DIFFERENTIAL/PLATELET
Abs Immature Granulocytes: 0.02 10*3/uL (ref 0.00–0.07)
Basophils Absolute: 0.1 10*3/uL (ref 0.0–0.1)
Basophils Relative: 1 %
Eosinophils Absolute: 0.3 10*3/uL (ref 0.0–0.5)
Eosinophils Relative: 6 %
HCT: 38.4 % (ref 36.0–46.0)
Hemoglobin: 13.5 g/dL (ref 12.0–15.0)
Immature Granulocytes: 0 %
Lymphocytes Relative: 28 %
Lymphs Abs: 1.4 10*3/uL (ref 0.7–4.0)
MCH: 31 pg (ref 26.0–34.0)
MCHC: 35.2 g/dL (ref 30.0–36.0)
MCV: 88.3 fL (ref 80.0–100.0)
Monocytes Absolute: 0.4 10*3/uL (ref 0.1–1.0)
Monocytes Relative: 9 %
Neutro Abs: 2.7 10*3/uL (ref 1.7–7.7)
Neutrophils Relative %: 56 %
Platelets: 252 10*3/uL (ref 150–400)
RBC: 4.35 MIL/uL (ref 3.87–5.11)
RDW: 12.3 % (ref 11.5–15.5)
WBC: 4.9 10*3/uL (ref 4.0–10.5)
nRBC: 0 % (ref 0.0–0.2)

## 2022-02-09 LAB — COMPREHENSIVE METABOLIC PANEL
ALT: 15 U/L (ref 0–44)
AST: 15 U/L (ref 15–41)
Albumin: 4.1 g/dL (ref 3.5–5.0)
Alkaline Phosphatase: 60 U/L (ref 38–126)
Anion gap: 8 (ref 5–15)
BUN: 20 mg/dL (ref 8–23)
CO2: 23 mmol/L (ref 22–32)
Calcium: 9.7 mg/dL (ref 8.9–10.3)
Chloride: 105 mmol/L (ref 98–111)
Creatinine, Ser: 0.83 mg/dL (ref 0.44–1.00)
GFR, Estimated: >60 mL/min (ref >60)
Glucose, Bld: 116 mg/dL — ABNORMAL HIGH (ref 70–99)
Potassium: 4 mmol/L (ref 3.5–5.1)
Sodium: 136 mmol/L (ref 135–145)
Total Bilirubin: 0.5 mg/dL (ref 0.3–1.2)
Total Protein: 6.9 g/dL (ref 6.5–8.1)

## 2022-02-10 ENCOUNTER — Telehealth: Payer: Self-pay

## 2022-02-10 NOTE — Telephone Encounter (Signed)
Returned her call. She is wanting to move Dr. Alvy Bimler appt for 7/10 further out. She just had labs yesterday. Ask her to call the office back to schedule.

## 2022-02-10 NOTE — Telephone Encounter (Signed)
Attempted to call back. No answer. She needs MD appt moved out a week from lab appt yesterday.   FYI

## 2022-02-11 ENCOUNTER — Telehealth: Payer: Self-pay | Admitting: Hematology and Oncology

## 2022-02-11 NOTE — Telephone Encounter (Signed)
Per 7/7 phone lines pt called saying she got a message saying it was ok to put her appointment on 7/17 at 8 am.  Appointment was moved per pt request

## 2022-02-14 ENCOUNTER — Ambulatory Visit: Payer: 59 | Admitting: Hematology and Oncology

## 2022-02-14 ENCOUNTER — Inpatient Hospital Stay: Payer: 59 | Admitting: Hematology and Oncology

## 2022-02-14 LAB — BCR/ABL

## 2022-02-21 ENCOUNTER — Inpatient Hospital Stay (HOSPITAL_BASED_OUTPATIENT_CLINIC_OR_DEPARTMENT_OTHER): Payer: 59 | Admitting: Hematology and Oncology

## 2022-02-21 ENCOUNTER — Other Ambulatory Visit: Payer: Self-pay

## 2022-02-21 ENCOUNTER — Encounter: Payer: Self-pay | Admitting: Hematology and Oncology

## 2022-02-21 DIAGNOSIS — Z8679 Personal history of other diseases of the circulatory system: Secondary | ICD-10-CM | POA: Diagnosis not present

## 2022-02-21 DIAGNOSIS — Z79899 Other long term (current) drug therapy: Secondary | ICD-10-CM | POA: Diagnosis not present

## 2022-02-21 DIAGNOSIS — Z79890 Hormone replacement therapy (postmenopausal): Secondary | ICD-10-CM | POA: Diagnosis not present

## 2022-02-21 DIAGNOSIS — C9210 Chronic myeloid leukemia, BCR/ABL-positive, not having achieved remission: Secondary | ICD-10-CM | POA: Diagnosis not present

## 2022-02-21 NOTE — Assessment & Plan Note (Signed)
She is doing well Her most recent molecular study confirm she is still in complete remission with nondetectable BCR/ABL Per guidelines, she needs to complete monthly blood work monitoring for up to a year, until January 2024 before we switch her to every 3 months surveillance She is in agreement with the plan of care

## 2022-02-21 NOTE — Progress Notes (Signed)
[facility] OFFICE PROGRESS NOTE  Patient Care Team: Panosh, Standley Brooking, MD as PCP - General [name], MD (Endocrinology) [name], MD (Internal Medicine) Everett Graff, MD as Attending Physician (Obstetrics and Gynecology) Heath Lark, MD as Consulting Physician (Hematology and Oncology) Chesley Mires, MD as Consulting Physician (Pulmonary Disease)  ASSESSMENT & PLAN:  CML (chronic myeloid leukemia) [facility]) She is doing well Her most recent molecular study confirm she is still in complete remission with nondetectable BCR/ABL Per guidelines, she needs to complete monthly blood work monitoring for up to a year, until January 2024 before we switch her to every 3 months surveillance She is in agreement with the plan of care  No orders of the defined types were placed in this encounter.   All questions were answered. The patient knows to call the clinic with any problems, questions or concerns. The total time spent in the appointment was 20 minutes encounter with patients including review of chart and various tests results, discussions about plan of care and coordination of care plan   Heath Lark, MD 02/21/2022 10:04 AM  INTERVAL HISTORY: Please see below for problem oriented charting. she returns for surveillance follow-up for history of CML She has discontinued treatment in January of this year due to recurrent subarachnoid hemorrhage She denies significant permanent neurological deficits although she still have occasional speech disturbance and mental fogginess  REVIEW OF SYSTEMS:   Constitutional: Denies fevers, chills or abnormal weight loss Eyes: Denies blurriness of vision Ears, nose, mouth, throat, and face: Denies mucositis or sore throat Respiratory: Denies cough, dyspnea or wheezes Cardiovascular: Denies palpitation, chest discomfort or lower extremity swelling Gastrointestinal:  Denies nausea, heartburn or change in bowel habits Skin: Denies  abnormal skin rashes Lymphatics: Denies new lymphadenopathy or easy bruising Neurological:Denies numbness, tingling or new weaknesses Behavioral/Psych: Mood is stable, no new changes  All other systems were reviewed with the patient and are negative.  I have reviewed the past medical history, past surgical history, social history and family history with the patient and they are unchanged from previous note.  ALLERGIES:  has No Known Allergies.  MEDICATIONS:  Current Outpatient Medications  Medication Sig Dispense Refill   acetaminophen (TYLENOL) 325 MG tablet Take 650 mg by mouth every 6 (six) hours as needed for moderate pain or headache.     losartan (COZAAR) 50 MG tablet Take 1 tablet (50 mg total) by mouth daily. 90 tablet 2   metroNIDAZOLE (METROCREAM) 0.75 % cream Apply 1 application topically daily. (Patient taking differently: Apply 1 application  topically daily as needed (dry skin).) 45 g 3   SYNTHROID 175 MCG tablet Take 1 tablet (175 mcg total) by mouth daily before breakfast. 90 tablet 0   verapamil (CALAN-SR) 240 MG CR tablet TAKE 1 TABLET BY MOUTH AT BEDTIME. 90 tablet 3   zolpidem (AMBIEN) 10 MG tablet Take 1 tablet (10 mg total) by mouth at bedtime as needed for sleep 90 tablet 0   atorvastatin (LIPITOR) 10 MG tablet Take 1 tablet (10 mg total) by mouth daily. (Patient not taking: Reported on 02/21/2022) 90 tablet 3   tretinoin (RETIN-A) 0.025 % cream Apply a pearl-sized amount to face in the evening once a day (Patient not taking: Reported on 02/21/2022) 45 g 3   No current facility-administered medications for this visit.    SUMMARY OF ONCOLOGIC HISTORY: Oncology History  CML (chronic myeloid leukemia) ([place])  05/15/2015 Bone Marrow Biopsy   BM biopsy and FISH confirmed CML [id number]  05/15/2015 - 05/20/2015 Chemotherapy   She started taking Hydrea 1500 mg daily   05/15/2015 Tumor Marker   BCR/ABL b2a2 87.5%, IS 49%   05/21/2015 - 08/16/2021 Chemotherapy   She  started taking Sprycel 100 mg daily   06/29/2015 Adverse Reaction   She had diarrhea. Dose of Srycel is reduced to 50 mg daily   09/10/2015 Tumor Marker   BCR/ABL b2a2 0.21%, IS 0.1743%   12/11/2015 Pathology Results   BCR/ABL b2a2 0.19%, IS 0.1577%   04/13/2016 Pathology Results   BCR/ABL b2a2 0.29%, IS 0.2407%   07/14/2016 Pathology Results   BCR/ABL b2a2 0.004%, IS 0.0092%. She is in MMR   07/21/2016 Miscellaneous   Dose of Sprycel is reduced to 50 mg daily   09/08/2016 Pathology Results   BCR/ABL b2a2 0.001%, IS 0.0023%. She is in MMR   06/14/2017 Pathology Results   BCR/ABL not detected. She is in MMR   10/11/2017 Pathology Results   BCR/ABL is not detected. She is in MMR   02/14/2018 Pathology Results   BCR/ABL is not detected. She is in MMR   05/17/2018 Pathology Results   BCR/ABL is not detected. She is in MMR   08/22/2018 Pathology Results   BCR/ABL is not detected. She is in MMR   11/30/2018 Pathology Results   BCR/ABL is not detected. She is in MMR   06/03/2019 Pathology Results   BCR/ABL is not detected. She is in MMR   09/03/2019 Pathology Results   BCR/ABL is not detected. She is in MMR   12/05/2019 Pathology Results   BCR/ABL is not detected. She is in MMR   06/01/2020 Pathology Results   BCR/ABL is not detected. She is in MMR   09/01/2020 Pathology Results   BCR/ABL is not detected. She is in MMR   12/21/2020 Pathology Results   BCR/ABL is not detected. She is in MMR   04/06/2021 Pathology Results   BCR/ABL is not detected. She is in MMR   08/11/2021 Pathology Results   BCR/ABL not detected. She is in MMR   09/17/2021 Pathology Results   BCR/ABL not detected. She is in MMR   10/15/2021 Pathology Results   BCR/ABL not detected. She is in MMR   11/12/2021 Pathology Results   BCR/ABL not detected. She is in MMR   12/10/2021 Pathology Results   BCR/ABL not detected. She is in MMR   01/10/2022 Pathology Results   BCR/ABL not detected. She is in MMR    02/09/2022 Pathology Results   BCR/ABL not detected. She is in MMR     PHYSICAL EXAMINATION: ECOG PERFORMANCE STATUS: 0 - Asymptomatic  Vitals:   02/21/22 0806  BP: 129/76  Pulse: 64  Resp: 16  Temp: 98.5 F (36.9 C)  SpO2: 99%   Filed Weights   02/21/22 0806  Weight: 167 lb 12.8 oz (76.1 kg)    GENERAL:alert, no distress and comfortable NEURO: alert & oriented x 3 with fluent speech, no focal motor/sensory deficits  LABORATORY DATA:  I have reviewed the data as listed    Component Value Date/Time   NA 136 02/09/2022 0754   NA 138 02/07/2017 0836   K 4.0 02/09/2022 0754   K 4.2 02/07/2017 0836   CL 105 02/09/2022 0754   CO2 23 02/09/2022 0754   CO2 23 02/07/2017 0836   GLUCOSE 116 (H) 02/09/2022 0754   GLUCOSE 85 02/07/2017 0836   BUN 20 02/09/2022 0754   BUN 15.5 02/07/2017 0836   CREATININE 0.83 02/09/2022 0754  CREATININE 0.9 02/07/2017 0836   CALCIUM 9.7 02/09/2022 0754   CALCIUM 9.5 02/07/2017 0836   PROT 6.9 02/09/2022 0754   PROT 7.0 02/07/2017 0836   ALBUMIN 4.1 02/09/2022 0754   ALBUMIN 3.8 02/07/2017 0836   AST 15 02/09/2022 0754   AST 25 02/07/2017 0836   ALT 15 02/09/2022 0754   ALT 36 02/07/2017 0836   ALKPHOS 60 02/09/2022 0754   ALKPHOS 55 02/07/2017 0836   BILITOT 0.5 02/09/2022 0754   BILITOT 0.31 02/07/2017 0836   GFRNONAA >60 02/09/2022 0754   GFRAA >60 03/03/2020 0747    No results found for: "SPEP", "UPEP"  Lab Results  Component Value Date   WBC 4.9 02/09/2022   NEUTROABS 2.7 02/09/2022   HGB 13.5 02/09/2022   HCT 38.4 02/09/2022   MCV 88.3 02/09/2022   PLT 252 02/09/2022      Chemistry      Component Value Date/Time   NA 136 02/09/2022 0754   NA 138 02/07/2017 0836   K 4.0 02/09/2022 0754   K 4.2 02/07/2017 0836   CL 105 02/09/2022 0754   CO2 23 02/09/2022 0754   CO2 23 02/07/2017 0836   BUN 20 02/09/2022 0754   BUN 15.5 02/07/2017 0836   CREATININE 0.83 02/09/2022 0754   CREATININE 0.9 02/07/2017 0836       Component Value Date/Time   CALCIUM 9.7 02/09/2022 0754   CALCIUM 9.5 02/07/2017 0836   ALKPHOS 60 02/09/2022 0754   ALKPHOS 55 02/07/2017 0836   AST 15 02/09/2022 0754   AST 25 02/07/2017 0836   ALT 15 02/09/2022 0754   ALT 36 02/07/2017 0836   BILITOT 0.5 02/09/2022 0754   BILITOT 0.31 02/07/2017 0836

## 2022-02-23 ENCOUNTER — Other Ambulatory Visit (HOSPITAL_COMMUNITY): Payer: Self-pay

## 2022-02-23 ENCOUNTER — Other Ambulatory Visit: Payer: Self-pay | Admitting: Internal Medicine

## 2022-02-23 MED ORDER — ZOLPIDEM TARTRATE 10 MG PO TABS
10.0000 mg | ORAL_TABLET | Freq: Every evening | ORAL | 0 refills | Status: DC | PRN
Start: 2022-02-23 — End: 2022-02-25
  Filled 2022-02-23: qty 90, 90d supply, fill #0
  Filled ????-??-??: fill #0

## 2022-02-23 NOTE — Telephone Encounter (Signed)
Last Ov 10/25/21 Filled 11/22/21 Is it ok to refill?

## 2022-02-25 ENCOUNTER — Other Ambulatory Visit (HOSPITAL_COMMUNITY): Payer: Self-pay

## 2022-02-25 ENCOUNTER — Other Ambulatory Visit: Payer: Self-pay | Admitting: Adult Health

## 2022-02-25 MED ORDER — ZOLPIDEM TARTRATE 10 MG PO TABS
10.0000 mg | ORAL_TABLET | Freq: Every evening | ORAL | 0 refills | Status: DC | PRN
  Filled 2022-02-25 – 2022-02-28 (×3): qty 90, 90d supply, fill #0

## 2022-02-28 ENCOUNTER — Other Ambulatory Visit (HOSPITAL_COMMUNITY): Payer: Self-pay

## 2022-02-28 ENCOUNTER — Other Ambulatory Visit: Payer: Self-pay | Admitting: Internal Medicine

## 2022-02-28 MED ORDER — LOSARTAN POTASSIUM 50 MG PO TABS
50.0000 mg | ORAL_TABLET | Freq: Every day | ORAL | 2 refills | Status: DC
  Filled 2022-02-28: qty 90, 90d supply, fill #0
  Filled 2022-06-05: qty 90, 90d supply, fill #1
  Filled 2022-09-02: qty 90, 90d supply, fill #2

## 2022-03-03 ENCOUNTER — Ambulatory Visit: Payer: 59 | Admitting: Psychology

## 2022-03-07 ENCOUNTER — Other Ambulatory Visit (HOSPITAL_COMMUNITY): Payer: Self-pay

## 2022-03-07 ENCOUNTER — Other Ambulatory Visit: Payer: Self-pay | Admitting: Internal Medicine

## 2022-03-07 MED ORDER — SYNTHROID 175 MCG PO TABS
175.0000 ug | ORAL_TABLET | Freq: Every day | ORAL | 2 refills | Status: DC
  Filled 2022-03-07: qty 90, 90d supply, fill #0
  Filled 2022-06-12: qty 90, 90d supply, fill #1
  Filled 2022-09-08 (×2): qty 90, 90d supply, fill #2

## 2022-03-07 NOTE — Telephone Encounter (Signed)
Please refill enough synthroid to get through march 2024

## 2022-03-14 ENCOUNTER — Other Ambulatory Visit: Payer: Self-pay

## 2022-03-14 ENCOUNTER — Inpatient Hospital Stay: Payer: 59 | Attending: Hematology and Oncology

## 2022-03-14 ENCOUNTER — Ambulatory Visit: Payer: 59 | Admitting: Hematology and Oncology

## 2022-03-14 DIAGNOSIS — C9210 Chronic myeloid leukemia, BCR/ABL-positive, not having achieved remission: Secondary | ICD-10-CM | POA: Insufficient documentation

## 2022-03-14 DIAGNOSIS — M79644 Pain in right finger(s): Secondary | ICD-10-CM | POA: Diagnosis not present

## 2022-03-14 DIAGNOSIS — M65849 Other synovitis and tenosynovitis, unspecified hand: Secondary | ICD-10-CM | POA: Diagnosis not present

## 2022-03-14 DIAGNOSIS — M79642 Pain in left hand: Secondary | ICD-10-CM | POA: Diagnosis not present

## 2022-03-14 DIAGNOSIS — M13841 Other specified arthritis, right hand: Secondary | ICD-10-CM | POA: Diagnosis not present

## 2022-03-14 DIAGNOSIS — M79641 Pain in right hand: Secondary | ICD-10-CM | POA: Diagnosis not present

## 2022-03-14 LAB — COMPREHENSIVE METABOLIC PANEL
ALT: 14 U/L (ref 0–44)
AST: 14 U/L — ABNORMAL LOW (ref 15–41)
Albumin: 4.3 g/dL (ref 3.5–5.0)
Alkaline Phosphatase: 52 U/L (ref 38–126)
Anion gap: 9 (ref 5–15)
BUN: 24 mg/dL — ABNORMAL HIGH (ref 8–23)
CO2: 22 mmol/L (ref 22–32)
Calcium: 9.4 mg/dL (ref 8.9–10.3)
Chloride: 105 mmol/L (ref 98–111)
Creatinine, Ser: 0.79 mg/dL (ref 0.44–1.00)
GFR, Estimated: >60 mL/min (ref >60)
Glucose, Bld: 103 mg/dL — ABNORMAL HIGH (ref 70–99)
Potassium: 4 mmol/L (ref 3.5–5.1)
Sodium: 136 mmol/L (ref 135–145)
Total Bilirubin: 0.5 mg/dL (ref 0.3–1.2)
Total Protein: 7.2 g/dL (ref 6.5–8.1)

## 2022-03-14 LAB — CBC WITH DIFFERENTIAL/PLATELET
Abs Immature Granulocytes: 0.01 10*3/uL (ref 0.00–0.07)
Basophils Absolute: 0.1 10*3/uL (ref 0.0–0.1)
Basophils Relative: 1 %
Eosinophils Absolute: 0.3 10*3/uL (ref 0.0–0.5)
Eosinophils Relative: 5 %
HCT: 38.4 % (ref 36.0–46.0)
Hemoglobin: 13.4 g/dL (ref 12.0–15.0)
Immature Granulocytes: 0 %
Lymphocytes Relative: 30 %
Lymphs Abs: 1.4 10*3/uL (ref 0.7–4.0)
MCH: 30.7 pg (ref 26.0–34.0)
MCHC: 34.9 g/dL (ref 30.0–36.0)
MCV: 88.1 fL (ref 80.0–100.0)
Monocytes Absolute: 0.5 10*3/uL (ref 0.1–1.0)
Monocytes Relative: 11 %
Neutro Abs: 2.4 10*3/uL (ref 1.7–7.7)
Neutrophils Relative %: 53 %
Platelets: 261 10*3/uL (ref 150–400)
RBC: 4.36 MIL/uL (ref 3.87–5.11)
RDW: 12.6 % (ref 11.5–15.5)
WBC: 4.7 10*3/uL (ref 4.0–10.5)
nRBC: 0 % (ref 0.0–0.2)

## 2022-03-15 DIAGNOSIS — M25561 Pain in right knee: Secondary | ICD-10-CM | POA: Diagnosis not present

## 2022-03-22 LAB — BCR/ABL

## 2022-03-29 ENCOUNTER — Other Ambulatory Visit (HOSPITAL_COMMUNITY): Payer: Self-pay

## 2022-04-12 ENCOUNTER — Inpatient Hospital Stay: Payer: 59 | Attending: Hematology and Oncology

## 2022-04-12 ENCOUNTER — Other Ambulatory Visit: Payer: Self-pay

## 2022-04-12 DIAGNOSIS — C9210 Chronic myeloid leukemia, BCR/ABL-positive, not having achieved remission: Secondary | ICD-10-CM | POA: Insufficient documentation

## 2022-04-12 LAB — COMPREHENSIVE METABOLIC PANEL
ALT: 13 U/L (ref 0–44)
AST: 13 U/L — ABNORMAL LOW (ref 15–41)
Albumin: 4.2 g/dL (ref 3.5–5.0)
Alkaline Phosphatase: 55 U/L (ref 38–126)
Anion gap: 7 (ref 5–15)
BUN: 23 mg/dL (ref 8–23)
CO2: 24 mmol/L (ref 22–32)
Calcium: 9.6 mg/dL (ref 8.9–10.3)
Chloride: 106 mmol/L (ref 98–111)
Creatinine, Ser: 0.79 mg/dL (ref 0.44–1.00)
GFR, Estimated: >60 mL/min (ref >60)
Glucose, Bld: 112 mg/dL — ABNORMAL HIGH (ref 70–99)
Potassium: 3.7 mmol/L (ref 3.5–5.1)
Sodium: 137 mmol/L (ref 135–145)
Total Bilirubin: 0.4 mg/dL (ref 0.3–1.2)
Total Protein: 6.8 g/dL (ref 6.5–8.1)

## 2022-04-12 LAB — CBC WITH DIFFERENTIAL/PLATELET
Abs Immature Granulocytes: 0.02 10*3/uL (ref 0.00–0.07)
Basophils Absolute: 0.1 10*3/uL (ref 0.0–0.1)
Basophils Relative: 2 %
Eosinophils Absolute: 0.2 10*3/uL (ref 0.0–0.5)
Eosinophils Relative: 4 %
HCT: 36.9 % (ref 36.0–46.0)
Hemoglobin: 13.2 g/dL (ref 12.0–15.0)
Immature Granulocytes: 0 %
Lymphocytes Relative: 26 %
Lymphs Abs: 1.2 10*3/uL (ref 0.7–4.0)
MCH: 31.5 pg (ref 26.0–34.0)
MCHC: 35.8 g/dL (ref 30.0–36.0)
MCV: 88.1 fL (ref 80.0–100.0)
Monocytes Absolute: 0.5 10*3/uL (ref 0.1–1.0)
Monocytes Relative: 10 %
Neutro Abs: 2.8 10*3/uL (ref 1.7–7.7)
Neutrophils Relative %: 58 %
Platelets: 248 10*3/uL (ref 150–400)
RBC: 4.19 MIL/uL (ref 3.87–5.11)
RDW: 12.8 % (ref 11.5–15.5)
WBC: 4.8 10*3/uL (ref 4.0–10.5)
nRBC: 0 % (ref 0.0–0.2)

## 2022-04-18 LAB — BCR/ABL

## 2022-05-10 ENCOUNTER — Inpatient Hospital Stay: Payer: 59

## 2022-05-11 ENCOUNTER — Inpatient Hospital Stay: Payer: 59 | Attending: Hematology and Oncology

## 2022-05-11 ENCOUNTER — Other Ambulatory Visit: Payer: Self-pay

## 2022-05-11 DIAGNOSIS — C9210 Chronic myeloid leukemia, BCR/ABL-positive, not having achieved remission: Secondary | ICD-10-CM | POA: Insufficient documentation

## 2022-05-11 DIAGNOSIS — Z79899 Other long term (current) drug therapy: Secondary | ICD-10-CM | POA: Insufficient documentation

## 2022-05-11 LAB — COMPREHENSIVE METABOLIC PANEL
ALT: 13 U/L (ref 0–44)
AST: 13 U/L — ABNORMAL LOW (ref 15–41)
Albumin: 4.1 g/dL (ref 3.5–5.0)
Alkaline Phosphatase: 52 U/L (ref 38–126)
Anion gap: 6 (ref 5–15)
BUN: 22 mg/dL (ref 8–23)
CO2: 24 mmol/L (ref 22–32)
Calcium: 9.3 mg/dL (ref 8.9–10.3)
Chloride: 104 mmol/L (ref 98–111)
Creatinine, Ser: 0.79 mg/dL (ref 0.44–1.00)
GFR, Estimated: >60 mL/min (ref >60)
Glucose, Bld: 121 mg/dL — ABNORMAL HIGH (ref 70–99)
Potassium: 4 mmol/L (ref 3.5–5.1)
Sodium: 134 mmol/L — ABNORMAL LOW (ref 135–145)
Total Bilirubin: 0.5 mg/dL (ref 0.3–1.2)
Total Protein: 6.3 g/dL — ABNORMAL LOW (ref 6.5–8.1)

## 2022-05-11 LAB — CBC WITH DIFFERENTIAL/PLATELET
Abs Immature Granulocytes: 0.01 10*3/uL (ref 0.00–0.07)
Basophils Absolute: 0.1 10*3/uL (ref 0.0–0.1)
Basophils Relative: 1 %
Eosinophils Absolute: 0.1 10*3/uL (ref 0.0–0.5)
Eosinophils Relative: 1 %
HCT: 36.5 % (ref 36.0–46.0)
Hemoglobin: 12.9 g/dL (ref 12.0–15.0)
Immature Granulocytes: 0 %
Lymphocytes Relative: 9 %
Lymphs Abs: 0.5 10*3/uL — ABNORMAL LOW (ref 0.7–4.0)
MCH: 31.2 pg (ref 26.0–34.0)
MCHC: 35.3 g/dL (ref 30.0–36.0)
MCV: 88.2 fL (ref 80.0–100.0)
Monocytes Absolute: 0.4 10*3/uL (ref 0.1–1.0)
Monocytes Relative: 7 %
Neutro Abs: 4.5 10*3/uL (ref 1.7–7.7)
Neutrophils Relative %: 82 %
Platelets: 224 10*3/uL (ref 150–400)
RBC: 4.14 MIL/uL (ref 3.87–5.11)
RDW: 12.4 % (ref 11.5–15.5)
WBC: 5.5 10*3/uL (ref 4.0–10.5)
nRBC: 0 % (ref 0.0–0.2)

## 2022-05-16 ENCOUNTER — Telehealth: Payer: Self-pay | Admitting: Internal Medicine

## 2022-05-16 NOTE — Telephone Encounter (Signed)
Neviya at [facility]  called to say they are still waiting for MD to sign off on Pt's Bone Density Test scheduled for tomorrow morning. Laverda Sorenson says they have been waiting since April. Laverda Sorenson also says request can be found in Epic. Laverda Sorenson was informed that MD will be out of the office until Wednesday.  Another copy of that request will be faxed this morning. [facility] would like to know if another MD can sign off on this test?

## 2022-05-16 NOTE — Telephone Encounter (Signed)
Christina [name] states the even though order was placed by MD in Epic the signature is "lost" & they need someone to resign. Since provider is out of office & appt is tomorrow, hard copy that was faxed over was signed by another provider.   Faxed to [phone] attn Neviya.

## 2022-05-17 ENCOUNTER — Telehealth: Payer: Self-pay

## 2022-05-17 ENCOUNTER — Other Ambulatory Visit: Payer: 59

## 2022-05-17 LAB — BCR/ABL

## 2022-05-17 NOTE — Telephone Encounter (Signed)
-----   Message from Heath Lark, MD sent at 05/17/2022 12:55 PM EDT ----- Can you call her and let her know labs are good and she is still in remission

## 2022-05-17 NOTE — Telephone Encounter (Signed)
Called and left below message. Ask her to call the office back for questions. 

## 2022-05-28 ENCOUNTER — Other Ambulatory Visit: Payer: Self-pay | Admitting: Adult Health

## 2022-05-30 ENCOUNTER — Other Ambulatory Visit (HOSPITAL_COMMUNITY): Payer: Self-pay

## 2022-05-30 ENCOUNTER — Other Ambulatory Visit: Payer: Self-pay | Admitting: Family Medicine

## 2022-05-30 ENCOUNTER — Encounter: Payer: 59 | Admitting: Internal Medicine

## 2022-05-30 MED ORDER — ZOLPIDEM TARTRATE 10 MG PO TABS
10.0000 mg | ORAL_TABLET | Freq: Every evening | ORAL | 0 refills | Status: DC | PRN
  Filled 2022-05-30: qty 30, 30d supply, fill #0

## 2022-05-30 NOTE — Telephone Encounter (Signed)
Patient needing refill of Ambien.  Her primary provider is out today on emergency leave.  This was refilled in her absence for 1 month  Christina Post MD [facility]

## 2022-06-06 ENCOUNTER — Other Ambulatory Visit (HOSPITAL_COMMUNITY): Payer: Self-pay

## 2022-06-07 ENCOUNTER — Inpatient Hospital Stay: Payer: 59

## 2022-06-07 ENCOUNTER — Ambulatory Visit: Payer: 59

## 2022-06-07 ENCOUNTER — Telehealth: Payer: Self-pay

## 2022-06-07 NOTE — Telephone Encounter (Signed)
Called and left a message asking her to call the office back to reschedule her missed lab appt today.

## 2022-06-13 ENCOUNTER — Other Ambulatory Visit (HOSPITAL_COMMUNITY): Payer: Self-pay

## 2022-06-14 ENCOUNTER — Inpatient Hospital Stay: Payer: 59 | Attending: Hematology and Oncology

## 2022-06-14 ENCOUNTER — Ambulatory Visit (INDEPENDENT_AMBULATORY_CARE_PROVIDER_SITE_OTHER): Payer: 59 | Admitting: Internal Medicine

## 2022-06-14 ENCOUNTER — Other Ambulatory Visit: Payer: Self-pay

## 2022-06-14 ENCOUNTER — Other Ambulatory Visit: Payer: Self-pay | Admitting: Internal Medicine

## 2022-06-14 ENCOUNTER — Encounter: Payer: Self-pay | Admitting: Internal Medicine

## 2022-06-14 VITALS — BP 120/72 | HR 69 | Temp 97.4°F | Ht 67.0 in | Wt 164.8 lb

## 2022-06-14 DIAGNOSIS — E039 Hypothyroidism, unspecified: Secondary | ICD-10-CM

## 2022-06-14 DIAGNOSIS — Z79899 Other long term (current) drug therapy: Secondary | ICD-10-CM | POA: Diagnosis not present

## 2022-06-14 DIAGNOSIS — E2839 Other primary ovarian failure: Secondary | ICD-10-CM | POA: Diagnosis not present

## 2022-06-14 DIAGNOSIS — C9210 Chronic myeloid leukemia, BCR/ABL-positive, not having achieved remission: Secondary | ICD-10-CM

## 2022-06-14 DIAGNOSIS — G4700 Insomnia, unspecified: Secondary | ICD-10-CM | POA: Diagnosis not present

## 2022-06-14 DIAGNOSIS — E782 Mixed hyperlipidemia: Secondary | ICD-10-CM

## 2022-06-14 DIAGNOSIS — I10 Essential (primary) hypertension: Secondary | ICD-10-CM

## 2022-06-14 DIAGNOSIS — Z0000 Encounter for general adult medical examination without abnormal findings: Secondary | ICD-10-CM | POA: Diagnosis not present

## 2022-06-14 LAB — CBC WITH DIFFERENTIAL/PLATELET
Abs Immature Granulocytes: 0.01 K/uL (ref 0.00–0.07)
Basophils Absolute: 0.1 K/uL (ref 0.0–0.1)
Basophils Relative: 1 %
Eosinophils Absolute: 0.2 K/uL (ref 0.0–0.5)
Eosinophils Relative: 4 %
HCT: 38.1 % (ref 36.0–46.0)
Hemoglobin: 13.3 g/dL (ref 12.0–15.0)
Immature Granulocytes: 0 %
Lymphocytes Relative: 25 %
Lymphs Abs: 1.3 K/uL (ref 0.7–4.0)
MCH: 30.8 pg (ref 26.0–34.0)
MCHC: 34.9 g/dL (ref 30.0–36.0)
MCV: 88.2 fL (ref 80.0–100.0)
Monocytes Absolute: 0.4 K/uL (ref 0.1–1.0)
Monocytes Relative: 8 %
Neutro Abs: 3.2 K/uL (ref 1.7–7.7)
Neutrophils Relative %: 62 %
Platelets: 280 K/uL (ref 150–400)
RBC: 4.32 MIL/uL (ref 3.87–5.11)
RDW: 12.1 % (ref 11.5–15.5)
WBC: 5.2 K/uL (ref 4.0–10.5)
nRBC: 0 % (ref 0.0–0.2)

## 2022-06-14 LAB — COMPREHENSIVE METABOLIC PANEL
ALT: 14 U/L (ref 0–44)
AST: 14 U/L — ABNORMAL LOW (ref 15–41)
Albumin: 4.2 g/dL (ref 3.5–5.0)
Alkaline Phosphatase: 56 U/L (ref 38–126)
Anion gap: 9 (ref 5–15)
BUN: 18 mg/dL (ref 8–23)
CO2: 24 mmol/L (ref 22–32)
Calcium: 9.6 mg/dL (ref 8.9–10.3)
Chloride: 103 mmol/L (ref 98–111)
Creatinine, Ser: 0.8 mg/dL (ref 0.44–1.00)
GFR, Estimated: >60 mL/min (ref >60)
Glucose, Bld: 98 mg/dL (ref 70–99)
Potassium: 3.9 mmol/L (ref 3.5–5.1)
Sodium: 136 mmol/L (ref 135–145)
Total Bilirubin: 0.5 mg/dL (ref 0.3–1.2)
Total Protein: 7.2 g/dL (ref 6.5–8.1)

## 2022-06-14 NOTE — Progress Notes (Signed)
Chief Complaint  Patient presents with   Annual Exam    HPI: Patient  Christina [name]  61 y.o. comes in today for Preventive Health Care visit  and med check  BP: doing ok.  Same meds  Thyroid:: regular use no change  Meds  sleep: still helps Ambien. Tird but juggling a lot  .  Family father etc  CML no sx  had labs today  Bone density didn't get done ? No order?   No HA or recent trauma  Health Maintenance  Topic Date Due   HIV Screening  Never done   Hepatitis C Screening  Never done   PAP SMEAR-Modifier  09/11/2016   COVID-19 Vaccine (2 - Pfizer risk series) 05/31/2022   MAMMOGRAM  10/27/2023   TETANUS/TDAP  04/16/2029   COLONOSCOPY (Pts 45-47yr Insurance coverage will need to be confirmed)  08/26/2029   INFLUENZA VACCINE  Completed   Zoster Vaccines- Shingrix  Completed   HPV VACCINES  Aged Out   Health Maintenance Review LIFESTYLE:  Exercise:  :  not now.  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 BSuzette Battiest  Infertility, female    Leukemia ([facility]    Menorrhagia    Had Novasure   [place] ([place]el[place]roliferative [place]) [place] 05/18/[place]   OSA (obstructive sleep apnea) 05/02/2019   Polyp of cervix    Endometriel polyp. JO   Sleep apnea    has cpap does not use   Vitamin D deficiency     Past Surgical History:  Procedure Laterality Date   BUNIONECTOMY  1986    CRANIOTOMY N/A 06/17/2016   Procedure: RIGHT CRANIOTOMY HEMATOMA EVACUATION SUBDURAL;  Surgeon: DEustace Moore MD;  [facility]  Service: Neurosurgery;  Laterality: N/A;   CRANIOTOMY Right 06/18/2016   Procedure: CRANIOTOMY HEMATOMA EVACUATION SUBDURAL RIGHT;  Surgeon: DEustace Moore MD;  [facility]  Service: Neurosurgery;  Laterality: Right;   CRANIOTOMY Left 06/04/2021   Procedure: CRANIOTOMY HEMATOMA EVACUATION SUBDURAL;  Surgeon: JEustace Moore MD;  [place] [facility]  Service: Neurosurgery;  Laterality: Left;   CRANIOTOMY Left 06/27/2021   Procedure: LEFT CRANIOTOMY HEMATOMA EVACUATION SUBDURAL;  Surgeon: JEustace Moore MD;  [place] [facility]  Service: Neurosurgery;  Laterality: Left;   DILATION AND CURETTAGE OF UTERUS  09/15/2010   HYSTEROSCOPY WITH D & C  09/28/2010   MOUTH SURGERY     NOVASURE ABLATION  09/28/2010   REFRACTIVE SURGERY     for vision   TONSILLECTOMY  1965    Family History  Problem Relation Age of Onset   Diabetes Mother    Cancer Mother 759      ovarian  ca   Breast cancer Mother    Hypertension Other    Prostate cancer Other        grandfather   Hypertension Father    Cancer Maternal Uncle        leukemia   Colon cancer Neg Hx    Esophageal cancer Neg Hx    Rectal cancer Neg Hx    Stomach cancer Neg Hx    Colon polyps Neg Hx     Social History   Socioeconomic History   Marital status: Married    Spouse name: Not on file   Number of children: Not on file   Years of education: Not on file   Highest education level: Not on file  Occupational History   Not on file  Tobacco Use   Smoking status: Never   Smokeless tobacco: Never  Vaping Use   Vaping Use: Never used  Substance and Sexual Activity   Alcohol use: Yes    Comment: socially   Drug use: No   Sexual activity: Yes  Other Topics Concern   Not on file  Social History Narrative   [place]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 90 tablet 2   SYNTHROID 175 MCG tablet Take 1 tablet (175 mcg total) by mouth daily before breakfast. 90 tablet 2   verapamil (CALAN-SR) 240 MG CR tablet TAKE 1 TABLET BY MOUTH AT BEDTIME. 90 tablet 3   zolpidem (AMBIEN) 10 MG tablet Take 1 tablet (10 mg total) by mouth at bedtime as needed for sleep 30 tablet 0   acetaminophen (TYLENOL) 325 MG tablet Take 650 mg by mouth every 6 (six) hours as needed for moderate pain or headache.     metroNIDAZOLE (METROCREAM) 0.75 % cream Apply 1 application topically daily. (Patient taking differently: Apply 1 application  topically daily as needed (dry skin).) 45 g 3   No facility-administered medications prior to visit.     EXAM:  BP 120/72 (BP Location: Right Arm, Patient Position: Sitting, Cuff Size: Normal)   Pulse 69   Temp (!) 97.4 F (36.3 C) (Oral)   Ht '5\' 7"'$  (1.702 m)   Wt 164 lb 12.8 oz (74.8 kg)   SpO2 97%   BMI 25.81 kg/m   Body mass index is 25.81 kg/m. Wt Readings from Last 3 Encounters:  06/14/22 164 lb 12.8 oz (74.8 kg)  02/21/22 167 lb 12.8 oz (76.1 kg)  10/25/21 167 lb 9.6 oz (76 kg)    Physical Exam: Vital signs reviewed WPY:KDXI is a well-developed well-nourished alert cooperative    who appearsr stated age in no acute distress.  HEENT: normocephalic atraumatic , Eyes: PERRL EOM's full, conjunctiva clear, Nares: paten,t no deformity discharge or tenderness.,Mouth: clear OP, no lesions, edema.  Moist mucous membranes. Dentition in adequate repair. NECK: supple without masses, thyromegaly or bruits. CHEST/PULM:  Clear to auscultation and percussion breath sounds equal  no wheeze , rales or rhonchi. No chest wall deformities or tenderness. Breast: normal by inspection . No dimpling, discharge, masses, tenderness or  discharge . CV: PMI is nondisplaced, S1 S2 no gallops, murmurs, rubs. Peripheral pulses are full without delay.No JVD .  ABDOMEN: Bowel sounds normal nontender  No guard or rebound, no hepato splenomegal no CVA tenderness Extremtities:  No clubbing cyanosis or edema, no acute joint swelling or redness no focal atrophy NEURO:  Oriented x3, cranial nerves 3-12 appear to be intact, no obvious focal weakness,gait within normal limits no abnormal reflexes or asymmetrical SKIN: No acute rashes normal turgor, color, no bruising or petechiae. Dry skin  no rash  PSYCH: Oriented, good eye contact, no obvious depression anxiety, cognition and judgment appear normal. LN: no cervical axillary inguinal adenopathy  Lab Results  Component Value Date   WBC 5.2 06/14/2022   HGB 13.3 06/14/2022   HCT 38.1 06/14/2022   PLT 280 06/14/2022   GLUCOSE 98 06/14/2022   CHOL 201 (H) 10/20/2021   TRIG 126.0 10/20/2021   HDL 56.70 10/20/2021   LDLDIRECT 162.0 04/24/2019   LDLCALC 119 (H) 10/20/2021   ALT 14 06/14/2022   AST 14 (L) 06/14/2022   NA 136 06/14/2022   K 3.9 06/14/2022   CL 103 06/14/2022   CREATININE 0.80 06/14/2022   BUN 18 06/14/2022   CO2 24 06/14/2022   TSH 1.40 10/20/2021   INR 0.9 09/17/2021   HGBA1C 5.8 10/20/2021    BP Readings from Last 3 Encounters:  06/14/22 120/72  02/21/22 129/76  10/25/21 124/66    Lab results reviewed with patient   ASSESSMENT AND PLAN:  Discussed the following assessment and plan:    ICD-10-CM   1. Visit for preventive health examination  Z00.00     2. Hypothyroidism, unspecified type  E03.9 DG Bone Density    3. Medication management  Z79.899     4. Essential hypertension  I10     5. CML (chronic myeloid leukemia) (HCC)  C92.10     6. Mixed hyperlipidemia  E78.2     7. Insomnia, unspecified type   G47.00     8. Estrogen deficiency  E28.39 DG Bone Density    Conditions stable  no med change    Return for lab  march  then yearly cpe.  Patient Care Team: Brayah Urquilla, Standley Brooking, MD as PCP - General [name], MD (Endocrinology) [name], MD (Internal Medicine) Everett Graff, MD as Attending Physician (Obstetrics and Gynecology) Heath Lark, MD as Consulting Physician (Hematology and Oncology) Chesley Mires, MD as Consulting Physician (Pulmonary Disease) Patient Instructions  Good to see  you  Get fasting lab in or around March 24  for lipid and tsh.   No  otherwise  Will order dexa scan at  the [facility]  Standley Brooking. Marlane Hirschmann M.D.

## 2022-06-14 NOTE — Patient Instructions (Addendum)
Good to see  you  Get fasting lab in or around March 24  for lipid and tsh.   No  otherwise  Will order dexa scan at  the breast center

## 2022-06-21 ENCOUNTER — Telehealth: Payer: Self-pay

## 2022-06-21 LAB — BCR/ABL

## 2022-06-21 NOTE — Telephone Encounter (Signed)
-----   Message from Heath Lark, MD sent at 06/21/2022 11:35 AM EST ----- Can you call  her and let her know she is still in remission Due to recent delay in her labs, I suggest we cancel her labs in 2weeks, keep the one in dec

## 2022-06-21 NOTE — Telephone Encounter (Signed)
Called and left below message. Ask her to call the office for questions. 11/27 lab appt canceled.

## 2022-06-26 ENCOUNTER — Other Ambulatory Visit: Payer: Self-pay | Admitting: Family Medicine

## 2022-06-27 ENCOUNTER — Other Ambulatory Visit (HOSPITAL_COMMUNITY): Payer: Self-pay

## 2022-06-27 MED ORDER — ZOLPIDEM TARTRATE 10 MG PO TABS
10.0000 mg | ORAL_TABLET | Freq: Every evening | ORAL | 0 refills | Status: DC | PRN
  Filled 2022-06-27: qty 90, 90d supply, fill #0

## 2022-06-27 NOTE — Telephone Encounter (Addendum)
Last OV-06/14/22 Last refill-05/30/22---30 tabs, 0 refills  No future OV scheduled.

## 2022-07-03 ENCOUNTER — Other Ambulatory Visit: Payer: Self-pay | Admitting: Internal Medicine

## 2022-07-04 ENCOUNTER — Other Ambulatory Visit (HOSPITAL_COMMUNITY): Payer: Self-pay

## 2022-07-04 ENCOUNTER — Inpatient Hospital Stay: Payer: 59

## 2022-07-04 MED ORDER — VERAPAMIL HCL ER 240 MG PO TBCR
240.0000 mg | EXTENDED_RELEASE_TABLET | Freq: Every day | ORAL | 3 refills | Status: DC
  Filled 2022-07-04: qty 90, 90d supply, fill #0
  Filled 2022-10-03: qty 90, 90d supply, fill #1
  Filled 2022-12-25: qty 90, 90d supply, fill #2
  Filled 2023-04-03: qty 90, 90d supply, fill #3

## 2022-08-03 ENCOUNTER — Inpatient Hospital Stay: Payer: 59 | Attending: Hematology and Oncology

## 2022-08-03 ENCOUNTER — Other Ambulatory Visit: Payer: Self-pay

## 2022-08-03 DIAGNOSIS — C9210 Chronic myeloid leukemia, BCR/ABL-positive, not having achieved remission: Secondary | ICD-10-CM | POA: Diagnosis present

## 2022-08-03 LAB — COMPREHENSIVE METABOLIC PANEL
ALT: 15 U/L (ref 0–44)
AST: 14 U/L — ABNORMAL LOW (ref 15–41)
Albumin: 4.1 g/dL (ref 3.5–5.0)
Alkaline Phosphatase: 56 U/L (ref 38–126)
Anion gap: 7 (ref 5–15)
BUN: 16 mg/dL (ref 8–23)
CO2: 24 mmol/L (ref 22–32)
Calcium: 9.6 mg/dL (ref 8.9–10.3)
Chloride: 105 mmol/L (ref 98–111)
Creatinine, Ser: 0.83 mg/dL (ref 0.44–1.00)
GFR, Estimated: >60 mL/min (ref >60)
Glucose, Bld: 108 mg/dL — ABNORMAL HIGH (ref 70–99)
Potassium: 4 mmol/L (ref 3.5–5.1)
Sodium: 136 mmol/L (ref 135–145)
Total Bilirubin: 0.4 mg/dL (ref 0.3–1.2)
Total Protein: 7 g/dL (ref 6.5–8.1)

## 2022-08-03 LAB — CBC WITH DIFFERENTIAL/PLATELET
Abs Immature Granulocytes: 0.02 10*3/uL (ref 0.00–0.07)
Basophils Absolute: 0.1 10*3/uL (ref 0.0–0.1)
Basophils Relative: 1 %
Eosinophils Absolute: 0.3 10*3/uL (ref 0.0–0.5)
Eosinophils Relative: 5 %
HCT: 37.2 % (ref 36.0–46.0)
Hemoglobin: 13.2 g/dL (ref 12.0–15.0)
Immature Granulocytes: 0 %
Lymphocytes Relative: 30 %
Lymphs Abs: 1.5 10*3/uL (ref 0.7–4.0)
MCH: 31.4 pg (ref 26.0–34.0)
MCHC: 35.5 g/dL (ref 30.0–36.0)
MCV: 88.4 fL (ref 80.0–100.0)
Monocytes Absolute: 0.5 10*3/uL (ref 0.1–1.0)
Monocytes Relative: 9 %
Neutro Abs: 2.9 10*3/uL (ref 1.7–7.7)
Neutrophils Relative %: 55 %
Platelets: 272 10*3/uL (ref 150–400)
RBC: 4.21 MIL/uL (ref 3.87–5.11)
RDW: 12.3 % (ref 11.5–15.5)
WBC: 5.2 10*3/uL (ref 4.0–10.5)
nRBC: 0 % (ref 0.0–0.2)

## 2022-08-04 ENCOUNTER — Telehealth: Payer: Self-pay | Admitting: Internal Medicine

## 2022-08-04 NOTE — Telephone Encounter (Signed)
Patient would to do TOC from Dr.Panosh to [facility]. Okay to schedule?        Please advise

## 2022-08-10 LAB — BCR/ABL

## 2022-08-11 ENCOUNTER — Telehealth: Payer: Self-pay

## 2022-08-11 NOTE — Telephone Encounter (Signed)
-----   Message from Heath Lark, MD sent at 08/11/2022  8:08 AM EST ----- Pls call and let her know she is still in remission

## 2022-08-11 NOTE — Telephone Encounter (Signed)
Called and left below message. Ask her to call the office back for questions. 

## 2022-08-23 ENCOUNTER — Other Ambulatory Visit: Payer: Self-pay | Admitting: Internal Medicine

## 2022-08-23 ENCOUNTER — Ambulatory Visit
Admission: RE | Admit: 2022-08-23 | Discharge: 2022-08-23 | Disposition: A | Discharge status: Home / Self Care | Payer: 59 | Source: Ambulatory Visit | Attending: Internal Medicine | Admitting: Internal Medicine

## 2022-08-23 DIAGNOSIS — Z1231 Encounter for screening mammogram for malignant neoplasm of breast: Secondary | ICD-10-CM

## 2022-08-23 DIAGNOSIS — E2839 Other primary ovarian failure: Secondary | ICD-10-CM

## 2022-08-23 DIAGNOSIS — E039 Hypothyroidism, unspecified: Secondary | ICD-10-CM

## 2022-08-30 ENCOUNTER — Encounter: Payer: Self-pay | Admitting: Adult Health

## 2022-08-30 ENCOUNTER — Ambulatory Visit (INDEPENDENT_AMBULATORY_CARE_PROVIDER_SITE_OTHER): Payer: 59 | Admitting: Adult Health

## 2022-08-30 VITALS — BP 120/80 | HR 70 | Temp 97.5°F | Ht 67.0 in | Wt 168.0 lb

## 2022-08-30 DIAGNOSIS — G4700 Insomnia, unspecified: Secondary | ICD-10-CM

## 2022-08-30 DIAGNOSIS — C9210 Chronic myeloid leukemia, BCR/ABL-positive, not having achieved remission: Secondary | ICD-10-CM

## 2022-08-30 DIAGNOSIS — I10 Essential (primary) hypertension: Secondary | ICD-10-CM

## 2022-08-30 DIAGNOSIS — E039 Hypothyroidism, unspecified: Secondary | ICD-10-CM

## 2022-08-30 DIAGNOSIS — Z7689 Persons encountering health services in other specified circumstances: Secondary | ICD-10-CM

## 2022-08-30 DIAGNOSIS — M25511 Pain in right shoulder: Secondary | ICD-10-CM

## 2022-08-30 DIAGNOSIS — E2839 Other primary ovarian failure: Secondary | ICD-10-CM

## 2022-08-30 MED ORDER — METHYLPREDNISOLONE ACETATE 80 MG/ML IJ SUSP
80.0000 mg | Freq: Once | INTRAMUSCULAR | Status: AC
  Administered 2022-08-30: 80 mg via INTRA_ARTICULAR

## 2022-08-30 NOTE — Progress Notes (Signed)
Patient presents to clinic today to establish care. 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  She was previously seeing Dr. Regis Bill   Her last CPE was I November 2023   Acute Concerns: Establish Care  Right shoulder pain -has been present for over the last month.  She believes that she injured her right shoulder when moving heavy boxes at work.  Pain has been getting better as has range of motion but it is still present.  It used to be that she cannot fully lift her arm over her head or behind her back but now she can lift it above her head, will have some discomfort with back scratch test.  Chronic Issues: Hypothyroidism -takes Synthroid 175 mcg daily  Lab Results  Component Value Date   TSH 1.40 10/20/2021   Hypertension - managed with Cozaar 50 mg daily and verapamil 240 mg CR daily.  BP Readings from Last 3 Encounters:  08/30/22 120/80  06/14/22 120/72  02/21/22 129/76   Insomnia - takes Ambien 10 mg QHS   H/o of CML - managed by Dr. Alvy Bimler.  Her most recent blood work confirmed that she is still in complete remission with no undetectable BCR/ABL.  Health Maintenance: Dental -- Routine Care Vision -- Routine Care Immunizations -- UTD Colonoscopy --08/2019 - 10 year plan  Mammogram -- yearly in March  PAP -- Overdue - does not have GYN at this time  Bone Density -- 08/2022 - Normal bone density    Past Medical History:  Diagnosis Date   Allergic rhinitis    Arthritis    Cleft palate     COVID 06/04/2021   mild to no symptoms   COVID-19 07/31/2019   tested positive, states mild aches/pains, fatigue   Diarrhea due to drug 06/30/2015   Fibroid    Grave's disease    H/O insomnia    H/O menorrhagia 2012   Headache    History of chicken pox    Hypertension    Hyperthyroidism    s/p rai 8/06 now hypothyroid Dr. 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 JO   Sleep apnea    has cpap does not use   Vitamin D deficiency     Past Surgical History:  Procedure Laterality Date   BUNIONECTOMY  1986   CRANIOTOMY N/A 06/17/2016   Procedure: RIGHT CRANIOTOMY HEMATOMA EVACUATION SUBDURAL;  Surgeon: Eustace Moore, MD;  Location: [facility];  Service: Neurosurgery;  Laterality: N/A;   CRANIOTOMY Right 06/18/2016   Procedure: CRANIOTOMY HEMATOMA EVACUATION SUBDURAL RIGHT;  Surgeon: Eustace Moore, MD;  Location: [place];  Service: Neurosurgery;  Laterality: Right;   CRANIOTOMY Left 06/04/2021   Procedure: CRANIOTOMY HEMATOMA EVACUATION SUBDURAL;  Surgeon: Eustace Moore, MD;  Location: [facility];  Service: Neurosurgery;  Laterality: Left;   CRANIOTOMY Left 06/27/2021   Procedure: LEFT CRANIOTOMY HEMATOMA EVACUATION SUBDURAL;  Surgeon: Eustace Moore, MD;  Location:  [facility];  Service: Neurosurgery;  Laterality: Left;   DILATION AND CURETTAGE OF UTERUS  09/15/2010   HYSTEROSCOPY WITH D & C  09/28/2010   MOUTH SURGERY     NOVASURE ABLATION  09/28/2010   REFRACTIVE SURGERY     for vision   TONSILLECTOMY  1965    Current Outpatient Medications on File Prior to Visit  Medication Sig Dispense Refill   losartan (COZAAR) 50 MG tablet Take 1 tablet (50 mg total) by mouth daily. 90 tablet 2   SYNTHROID 175 MCG tablet Take 1 tablet (175 mcg total) by mouth daily before breakfast. 90 tablet 2   verapamil (CALAN-SR) 240 MG CR tablet  Take 1 tablet (240 mg total) by mouth at bedtime. 90 tablet 3   zolpidem (AMBIEN) 10 MG tablet Take 1 tablet (10 mg total) by mouth at bedtime as needed for sleep 90 tablet 0   No current facility-administered medications on file prior to visit.    No Known Allergies  Family History  Problem Relation Age of Onset   Diabetes Mother    Cancer Mother 77       ovarian ca   Breast cancer Mother    Hypertension Other    Prostate cancer Other        grandfather   Hypertension Father    Cancer Maternal Uncle        leukemia   Colon cancer Neg Hx    Esophageal cancer Neg Hx    Rectal cancer Neg Hx    Stomach cancer Neg Hx    Colon polyps Neg Hx     Social History   Socioeconomic History   Marital status: Married    Spouse name: Not on file   Number of children: Not on file   Years of education: Not on file   Highest education level: Not on file  Occupational History   Not on file  Tobacco Use   Smoking status: Never   Smokeless tobacco: Never  Vaping Use   Vaping Use: Never used  Substance and Sexual Activity   Alcohol use: Yes    Comment: socially   Drug use: No   Sexual activity: Yes  Other Topics Concern   Not on file  Social History Narrative   [place]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   HENT: Negative.    Eyes: Negative.   Respiratory: Negative.    Cardiovascular: Negative.   Gastrointestinal: Negative.   Genitourinary: Negative.   Musculoskeletal:  Positive for joint pain.  Skin: Negative.   Endo/Heme/Allergies: Negative.   Psychiatric/Behavioral: Negative.      BP 120/80   Pulse 70   Temp (!) 97.5 F  (36.4 C) (Oral)   Ht '5\' 7"'$  (1.702 m)   Wt 168 lb (76.2 kg)   SpO2 98%   BMI 26.31 kg/m   Physical Exam Vitals and nursing note reviewed.  Constitutional:      General: She is not in acute distress.    Appearance: Normal appearance. She is well-developed. She is not ill-appearing.  HENT:     Head: Normocephalic and atraumatic.  Right Ear: Tympanic membrane, ear canal and external ear normal. There is no impacted cerumen.     Left Ear: Tympanic membrane, ear canal and external ear normal. There is no impacted cerumen.     Nose: Nose normal. No congestion or rhinorrhea.     Mouth/Throat:     Mouth: Mucous membranes are moist.     Pharynx: Oropharynx is clear. No oropharyngeal exudate or posterior oropharyngeal erythema.  Eyes:     General:        Right eye: No discharge.        Left eye: No discharge.     Extraocular Movements: Extraocular movements intact.     Conjunctiva/sclera: Conjunctivae normal.     Pupils: Pupils are equal, round, and reactive to light.  Neck:     Thyroid: No thyromegaly.     Vascular: No carotid bruit.     Trachea: No tracheal deviation.  Cardiovascular:     Rate and Rhythm: Normal rate and regular rhythm.     Pulses: Normal pulses.     Heart sounds: Normal heart sounds. No murmur heard.    No friction rub. No gallop.  Pulmonary:     Effort: Pulmonary effort is normal. No respiratory distress.     Breath sounds: Normal breath sounds. No stridor. No wheezing, rhonchi or rales.  Chest:     Chest wall: No tenderness.  Abdominal:     General: Abdomen is flat. Bowel sounds are normal. There is no distension.     Palpations: Abdomen is soft. There is no mass.     Tenderness: There is no abdominal tenderness. There is no right CVA tenderness, left CVA tenderness, guarding or rebound.     Hernia: No hernia is present.  Musculoskeletal:        General: No swelling, deformity or signs of injury.     Right shoulder: Tenderness present. No bony tenderness  or crepitus. Decreased range of motion. Normal strength.     Cervical back: Normal range of motion and neck supple.     Right lower leg: No edema.     Left lower leg: No edema.  Lymphadenopathy:     Cervical: No cervical adenopathy.  Skin:    General: Skin is warm and dry.     Coloration: Skin is not jaundiced or pale.     Findings: No bruising, erythema, lesion or rash.  Neurological:     General: No focal deficit present.     Mental Status: She is alert and oriented to person, place, and time.     Cranial Nerves: No cranial nerve deficit.     Sensory: No sensory deficit.     Motor: No weakness.     Coordination: Coordination normal.     Gait: Gait normal.     Deep Tendon Reflexes: Reflexes normal.  Psychiatric:        Mood and Affect: Mood normal.        Behavior: Behavior normal.        Thought Content: Thought content normal.        Judgment: Judgment normal.     Recent Results (from the past 2160 hour(s))  BCR-ABL     Status: None   Collection Time: 06/14/22  7:57 AM  Result Value Ref Range   BCR ABL,PCR See Scanned report in [place]: Performed at [facility], [place] [address] [place],  [address]  Comprehensive metabolic panel  Status: Abnormal   Collection Time: 06/14/22  7:57 AM  Result Value Ref Range   Sodium 136 135 - 145 mmol/L   Potassium 3.9 3.5 - 5.1 mmol/L   Chloride 103 98 - 111 mmol/L   CO2 24 22 - 32 mmol/L   Glucose, Bld 98 70 - 99 mg/dL    Comment: Glucose reference range applies only to samples taken after fasting for at least 8 hours.   BUN 18 8 - 23 mg/dL   Creatinine, Ser 0.80 0.44 - 1.00 mg/dL   Calcium 9.6 8.9 - 10.3 mg/dL   Total Protein 7.2 6.5 - 8.1 g/dL   Albumin 4.2 3.5 - 5.0 g/dL   AST 14 (L) 15 - 41 U/L   ALT 14 0 - 44 U/L   Alkaline Phosphatase 56 38 - 126 U/L   Total Bilirubin 0.5 0.3 - 1.2 mg/dL   GFR, Estimated >60 >60 mL/min    Comment: (NOTE) Calculated using the CKD-EPI  Creatinine Equation (2021)    Anion gap 9 5 - 15    Comment: Performed at [facility], [place] [address] [place], [place] [address]  CBC with Differential/Platelet     Status: None   Collection Time: 06/14/22  7:57 AM  Result Value Ref Range   WBC 5.2 4.0 - 10.5 K/uL   RBC 4.32 3.87 - 5.11 MIL/uL   Hemoglobin 13.3 12.0 - 15.0 g/dL   HCT 38.1 36.0 - 46.0 %   MCV 88.2 80.0 - 100.0 fL   MCH 30.8 26.0 - 34.0 pg   MCHC 34.9 30.0 - 36.0 g/dL   RDW 12.1 11.5 - 15.5 %   Platelets 280 150 - 400 K/uL   nRBC 0.0 0.0 - 0.2 %   Neutrophils Relative % 62 %   Neutro Abs 3.2 1.7 - 7.7 K/uL   Lymphocytes Relative 25 %   Lymphs Abs 1.3 0.7 - 4.0 K/uL   Monocytes Relative 8 %   Monocytes Absolute 0.4 0.1 - 1.0 K/uL   Eosinophils Relative 4 %   Eosinophils Absolute 0.2 0.0 - 0.5 K/uL   Basophils Relative 1 %   Basophils Absolute 0.1 0.0 - 0.1 K/uL   Immature Granulocytes 0 %   Abs Immature Granulocytes 0.01 0.00 - 0.07 K/uL    Comment: Performed at [facility], [place] [address] [place], [place] [address]  BCR-ABL     Status: None   Collection Time: 08/03/22  7:44 AM  Result Value Ref Range   BCR ABL,PCR See Scanned report in [place]: Performed at [facility], [place] [address], [place], [place] [address]  Comprehensive metabolic panel     Status: Abnormal   Collection Time: 08/03/22  7:44 AM  Result Value Ref Range   Sodium 136 135 - 145 mmol/L   Potassium 4.0 3.5 - 5.1 mmol/L   Chloride 105 98 - 111 mmol/L   CO2 24 22 - 32 mmol/L   Glucose, Bld 108 (H) 70 - 99 mg/dL    Comment: Glucose reference range applies only to samples taken after fasting for at least 8 hours.   BUN 16 8 - 23 mg/dL   Creatinine, Ser 0.83 0.44 - 1.00 mg/dL   Calcium 9.6 8.9 - 10.3 mg/dL   Total Protein 7.0 6.5 - 8.1 g/dL   Albumin 4.1 3.5 - 5.0 g/dL   AST 14 (L) 15 - 41 U/L  ALT 15 0 - 44 U/L   Alkaline Phosphatase 56 38  - 126 U/L   Total Bilirubin 0.4 0.3 - 1.2 mg/dL   GFR, Estimated >60 >60 mL/min    Comment: (NOTE) Calculated using the CKD-EPI Creatinine Equation (2021)    Anion gap 7 5 - 15    Comment: Performed at [facility], [place] [address] [place], [place] [address]  CBC with Differential/Platelet     Status: None   Collection Time: 08/03/22  7:44 AM  Result Value Ref Range   WBC 5.2 4.0 - 10.5 K/uL   RBC 4.21 3.87 - 5.11 MIL/uL   Hemoglobin 13.2 12.0 - 15.0 g/dL   HCT 37.2 36.0 - 46.0 %   MCV 88.4 80.0 - 100.0 fL   MCH 31.4 26.0 - 34.0 pg   MCHC 35.5 30.0 - 36.0 g/dL   RDW 12.3 11.5 - 15.5 %   Platelets 272 150 - 400 K/uL   nRBC 0.0 0.0 - 0.2 %   Neutrophils Relative % 55 %   Neutro Abs 2.9 1.7 - 7.7 K/uL   Lymphocytes Relative 30 %   Lymphs Abs 1.5 0.7 - 4.0 K/uL   Monocytes Relative 9 %   Monocytes Absolute 0.5 0.1 - 1.0 K/uL   Eosinophils Relative 5 %   Eosinophils Absolute 0.3 0.0 - 0.5 K/uL   Basophils Relative 1 %   Basophils Absolute 0.1 0.0 - 0.1 K/uL   Immature Granulocytes 0 %   Abs Immature Granulocytes 0.02 0.00 - 0.07 K/uL    Comment: Performed at [facility], [place] [address], [place], [place] [address]    Assessment/Plan: 1. Encounter to establish care - Follow up in November for CPE or sooner if needed  2. Acute pain of right shoulder Shoulder injection Verbal consent obtained and verified. Sterile betadine prep. Furthur cleansed with alcohol. Topical analgesic spray: Ethyl chloride. Joint: right subacromial injection Approached in typical fashion with: posterior approach Completed without difficulty Meds: 3 cc lidocaine 2% no epi, 1 cc depomedrol '80mg'$ /cc Needle:1.5 inch 25 gauge Aftercare instructions and Red flags advised. Immediate improvement in pain noted  - methylPREDNISolone acetate (DEPO-MEDROL) injection 80 mg  3. Hypothyroidism, unspecified type - Continue with Synthroid   4. Essential  hypertension - well controlled. No change in medication   5. CML (chronic myeloid leukemia) ([place]) - Per oncology/hematology   6. Insomnia, unspecified type - Continue ambian   7. Estrogen deficiency  - Ambulatory referral to Gynecology   Dorothyann Peng, NP

## 2022-08-30 NOTE — Patient Instructions (Signed)
It was great seeing you today!  Allow three days for your steroid injection to work   Let me know if you need anything.   You are do for your physical exam in November   Someone from GYN will call you to schedule your appointment

## 2022-08-31 ENCOUNTER — Other Ambulatory Visit: Payer: Self-pay

## 2022-08-31 ENCOUNTER — Inpatient Hospital Stay: Payer: 59 | Attending: Hematology and Oncology

## 2022-08-31 DIAGNOSIS — C9210 Chronic myeloid leukemia, BCR/ABL-positive, not having achieved remission: Secondary | ICD-10-CM | POA: Insufficient documentation

## 2022-08-31 LAB — CBC WITH DIFFERENTIAL/PLATELET
Abs Immature Granulocytes: 0.02 10*3/uL (ref 0.00–0.07)
Basophils Absolute: 0.1 10*3/uL (ref 0.0–0.1)
Basophils Relative: 1 %
Eosinophils Absolute: 0.2 10*3/uL (ref 0.0–0.5)
Eosinophils Relative: 3 %
HCT: 36.7 % (ref 36.0–46.0)
Hemoglobin: 13 g/dL (ref 12.0–15.0)
Immature Granulocytes: 0 %
Lymphocytes Relative: 20 %
Lymphs Abs: 1.3 10*3/uL (ref 0.7–4.0)
MCH: 31.4 pg (ref 26.0–34.0)
MCHC: 35.4 g/dL (ref 30.0–36.0)
MCV: 88.6 fL (ref 80.0–100.0)
Monocytes Absolute: 0.6 10*3/uL (ref 0.1–1.0)
Monocytes Relative: 9 %
Neutro Abs: 4.3 10*3/uL (ref 1.7–7.7)
Neutrophils Relative %: 67 %
Platelets: 285 10*3/uL (ref 150–400)
RBC: 4.14 MIL/uL (ref 3.87–5.11)
RDW: 12.6 % (ref 11.5–15.5)
WBC: 6.3 10*3/uL (ref 4.0–10.5)
nRBC: 0 % (ref 0.0–0.2)

## 2022-08-31 LAB — COMPREHENSIVE METABOLIC PANEL
ALT: 15 U/L (ref 0–44)
AST: 14 U/L — ABNORMAL LOW (ref 15–41)
Albumin: 4 g/dL (ref 3.5–5.0)
Alkaline Phosphatase: 56 U/L (ref 38–126)
Anion gap: 7 (ref 5–15)
BUN: 18 mg/dL (ref 8–23)
CO2: 25 mmol/L (ref 22–32)
Calcium: 9.7 mg/dL (ref 8.9–10.3)
Chloride: 105 mmol/L (ref 98–111)
Creatinine, Ser: 0.73 mg/dL (ref 0.44–1.00)
GFR, Estimated: >60 mL/min (ref >60)
Glucose, Bld: 92 mg/dL (ref 70–99)
Potassium: 4.3 mmol/L (ref 3.5–5.1)
Sodium: 137 mmol/L (ref 135–145)
Total Bilirubin: 0.4 mg/dL (ref 0.3–1.2)
Total Protein: 7.1 g/dL (ref 6.5–8.1)

## 2022-09-03 ENCOUNTER — Other Ambulatory Visit: Payer: Self-pay

## 2022-09-06 LAB — BCR/ABL

## 2022-09-08 ENCOUNTER — Other Ambulatory Visit (HOSPITAL_COMMUNITY): Payer: Self-pay

## 2022-09-12 ENCOUNTER — Encounter: Payer: Self-pay | Admitting: Hematology and Oncology

## 2022-09-12 ENCOUNTER — Inpatient Hospital Stay: Payer: 59 | Attending: Hematology and Oncology | Admitting: Hematology and Oncology

## 2022-09-12 ENCOUNTER — Other Ambulatory Visit (HOSPITAL_COMMUNITY): Payer: Self-pay

## 2022-09-12 VITALS — BP 116/68 | HR 69 | Resp 18 | Ht 67.0 in | Wt 165.2 lb

## 2022-09-12 DIAGNOSIS — C9210 Chronic myeloid leukemia, BCR/ABL-positive, not having achieved remission: Secondary | ICD-10-CM | POA: Insufficient documentation

## 2022-09-12 NOTE — Progress Notes (Signed)
[facility] OFFICE PROGRESS NOTE  Patient Care Team: Dorothyann Peng, NP as PCP - General (Family Medicine) [name], MD (Endocrinology) [name], MD (Internal Medicine) Everett Graff, MD as Attending Physician (Obstetrics and Gynecology) Heath Lark, MD as Consulting Physician (Hematology and Oncology) Chesley Mires, MD as Consulting Physician (Pulmonary Disease)  ASSESSMENT & PLAN:  CML (chronic myeloid leukemia) ([place]) She is doing well Her most recent molecular study confirm she is still in complete remission with nondetectable BCR/ABL Per guidelines, she needs to complete surveillance blood work every 3 months Assuming she stay in remission, she would not need to restart treatment I will see her once a year She is in agreement with the plan of care  No orders of the defined types were placed in this encounter.   All questions were answered. The patient knows to call the clinic with any problems, questions or concerns. The total time spent in the appointment was 20 minutes encounter with patients including review of chart and various tests results, discussions about plan of care and coordination of care plan   Heath Lark, MD 09/12/2022 3:10 PM  INTERVAL HISTORY: Please see below for problem oriented charting. she returns for further follow-up regarding history of CML She is doing very well Denies recent infection We reviewed her test results and recommendation moving forward  REVIEW OF SYSTEMS:   Constitutional: Denies fevers, chills or abnormal weight loss Eyes: Denies blurriness of vision Ears, nose, mouth, throat, and face: Denies mucositis or sore throat Respiratory: Denies cough, dyspnea or wheezes Cardiovascular: Denies palpitation, chest discomfort or lower extremity swelling Gastrointestinal:  Denies nausea, heartburn or change in bowel habits Skin: Denies abnormal skin rashes Lymphatics: Denies new lymphadenopathy or easy  bruising Neurological:Denies numbness, tingling or new weaknesses Behavioral/Psych: Mood is stable, no new changes  All other systems were reviewed with the patient and are negative.  I have reviewed the past medical history, past surgical history, social history and family history with the patient and they are unchanged from previous note.  ALLERGIES:  has No Known Allergies.  MEDICATIONS:  Current Outpatient Medications  Medication Sig Dispense Refill   losartan (COZAAR) 50 MG tablet Take 1 tablet (50 mg total) by mouth daily. 90 tablet 2   SYNTHROID 175 MCG tablet Take 1 tablet (175 mcg total) by mouth daily before breakfast. 90 tablet 2   verapamil (CALAN-SR) 240 MG CR tablet Take 1 tablet (240 mg total) by mouth at bedtime. 90 tablet 3   zolpidem (AMBIEN) 10 MG tablet Take 1 tablet (10 mg total) by mouth at bedtime as needed for sleep 90 tablet 0   No current facility-administered medications for this visit.    SUMMARY OF ONCOLOGIC HISTORY: Oncology History  CML (chronic myeloid leukemia) ([place])  05/15/2015 Bone Marrow Biopsy   BM biopsy and FISH confirmed CML [id number]   05/15/2015 - 05/20/2015 Chemotherapy   She started taking Hydrea 1500 mg daily   05/15/2015 Tumor Marker   BCR/ABL b2a2 87.5%, IS 49%   05/21/2015 - 08/16/2021 Chemotherapy   She started taking Sprycel 100 mg daily   06/29/2015 Adverse Reaction   She had diarrhea. Dose of Srycel is reduced to 50 mg daily   09/10/2015 Tumor Marker   BCR/ABL b2a2 0.21%, IS 0.1743%   12/11/2015 Pathology Results   BCR/ABL b2a2 0.19%, IS 0.1577%   04/13/2016 Pathology Results   BCR/ABL b2a2 0.29%, IS 0.2407%   07/14/2016 Pathology Results   BCR/ABL b2a2 0.004%, IS 0.0092%. She is  in MMR   07/21/2016 Miscellaneous   Dose of Sprycel is reduced to 50 mg daily   09/08/2016 Pathology Results   BCR/ABL b2a2 0.001%, IS 0.0023%. She is in MMR   06/14/2017 Pathology Results   BCR/ABL not detected. She is in MMR   10/11/2017  Pathology Results   BCR/ABL is not detected. She is in MMR   02/14/2018 Pathology Results   BCR/ABL is not detected. She is in MMR   05/17/2018 Pathology Results   BCR/ABL is not detected. She is in MMR   08/22/2018 Pathology Results   BCR/ABL is not detected. She is in MMR   11/30/2018 Pathology Results   BCR/ABL is not detected. She is in MMR   06/03/2019 Pathology Results   BCR/ABL is not detected. She is in MMR   09/03/2019 Pathology Results   BCR/ABL is not detected. She is in MMR   12/05/2019 Pathology Results   BCR/ABL is not detected. She is in MMR   06/01/2020 Pathology Results   BCR/ABL is not detected. She is in MMR   09/01/2020 Pathology Results   BCR/ABL is not detected. She is in MMR   12/21/2020 Pathology Results   BCR/ABL is not detected. She is in MMR   04/06/2021 Pathology Results   BCR/ABL is not detected. She is in MMR   08/11/2021 Pathology Results   BCR/ABL not detected. She is in MMR   09/17/2021 Pathology Results   BCR/ABL not detected. She is in MMR   10/15/2021 Pathology Results   BCR/ABL not detected. She is in MMR   11/12/2021 Pathology Results   BCR/ABL not detected. She is in MMR   12/10/2021 Pathology Results   BCR/ABL not detected. She is in MMR   01/10/2022 Pathology Results   BCR/ABL not detected. She is in MMR   02/09/2022 Pathology Results   BCR/ABL not detected. She is in MMR   03/16/2022 Pathology Results   BCR/ABL not detected. She is in MMR   04/19/2022 Pathology Results   BCR/ABL not detected. She is in MMR    05/11/2022 Pathology Results   BCR/ABL is not detected. She is in MMR    06/14/2022 Pathology Results   BCR/ABL is not detected. She is in MMR    08/03/2022 Pathology Results   BCR/ABL is not detected. She is in MMR     08/31/2022 Pathology Results   BCR/ABL not detected. She is in MMR      PHYSICAL EXAMINATION: ECOG PERFORMANCE STATUS: 0 - Asymptomatic  Vitals:   09/12/22 0808  BP: 116/68  Pulse: 69  Resp: 18   SpO2: 98%   Filed Weights   09/12/22 0808  Weight: 165 lb 3.2 oz (74.9 kg)    GENERAL:alert, no distress and comfortable  NEURO: alert & oriented x 3 with fluent speech, no focal motor/sensory deficits  LABORATORY DATA:  I have reviewed the data as listed    Component Value Date/Time   NA 137 08/31/2022 0742   NA 138 02/07/2017 0836   K 4.3 08/31/2022 0742   K 4.2 02/07/2017 0836   CL 105 08/31/2022 0742   CO2 25 08/31/2022 0742   CO2 23 02/07/2017 0836   GLUCOSE 92 08/31/2022 0742   GLUCOSE 85 02/07/2017 0836   BUN 18 08/31/2022 0742   BUN 15.5 02/07/2017 0836   CREATININE 0.73 08/31/2022 0742   CREATININE 0.9 02/07/2017 0836   CALCIUM 9.7 08/31/2022 0742   CALCIUM 9.5 02/07/2017 0836   PROT 7.1 08/31/2022  0742   PROT 7.0 02/07/2017 0836   ALBUMIN 4.0 08/31/2022 0742   ALBUMIN 3.8 02/07/2017 0836   AST 14 (L) 08/31/2022 0742   AST 25 02/07/2017 0836   ALT 15 08/31/2022 0742   ALT 36 02/07/2017 0836   ALKPHOS 56 08/31/2022 0742   ALKPHOS 55 02/07/2017 0836   BILITOT 0.4 08/31/2022 0742   BILITOT 0.31 02/07/2017 0836   GFRNONAA >60 08/31/2022 0742   GFRAA >60 03/03/2020 0747    No results found for: "SPEP", "UPEP"  Lab Results  Component Value Date   WBC 6.3 08/31/2022   NEUTROABS 4.3 08/31/2022   HGB 13.0 08/31/2022   HCT 36.7 08/31/2022   MCV 88.6 08/31/2022   PLT 285 08/31/2022      Chemistry      Component Value Date/Time   NA 137 08/31/2022 0742   NA 138 02/07/2017 0836   K 4.3 08/31/2022 0742   K 4.2 02/07/2017 0836   CL 105 08/31/2022 0742   CO2 25 08/31/2022 0742   CO2 23 02/07/2017 0836   BUN 18 08/31/2022 0742   BUN 15.5 02/07/2017 0836   CREATININE 0.73 08/31/2022 0742   CREATININE 0.9 02/07/2017 0836      Component Value Date/Time   CALCIUM 9.7 08/31/2022 0742   CALCIUM 9.5 02/07/2017 0836   ALKPHOS 56 08/31/2022 0742   ALKPHOS 55 02/07/2017 0836   AST 14 (L) 08/31/2022 0742   AST 25 02/07/2017 0836   ALT 15 08/31/2022 0742    ALT 36 02/07/2017 0836   BILITOT 0.4 08/31/2022 0742   BILITOT 0.31 02/07/2017 0836

## 2022-09-12 NOTE — Assessment & Plan Note (Signed)
She is doing well Her most recent molecular study confirm she is still in complete remission with nondetectable BCR/ABL Per guidelines, she needs to complete surveillance blood work every 3 months Assuming she stay in remission, she would not need to restart treatment I will see her once a year She is in agreement with the plan of care

## 2022-09-13 ENCOUNTER — Other Ambulatory Visit (HOSPITAL_COMMUNITY): Payer: Self-pay

## 2022-09-20 ENCOUNTER — Encounter: Payer: Commercial Managed Care - PPO | Admitting: Adult Health

## 2022-09-22 ENCOUNTER — Other Ambulatory Visit: Payer: Self-pay | Admitting: Internal Medicine

## 2022-09-27 ENCOUNTER — Other Ambulatory Visit: Payer: Self-pay | Admitting: Internal Medicine

## 2022-09-27 ENCOUNTER — Other Ambulatory Visit (HOSPITAL_COMMUNITY): Payer: Self-pay

## 2022-09-27 MED ORDER — LEVOTHYROXINE SODIUM 175 MCG PO TABS
175.0000 ug | ORAL_TABLET | Freq: Every day | ORAL | 2 refills | Status: DC
  Filled 2022-09-27: qty 90, 90d supply, fill #0

## 2022-09-27 MED ORDER — ZOLPIDEM TARTRATE 10 MG PO TABS
10.0000 mg | ORAL_TABLET | Freq: Every evening | ORAL | 0 refills | Status: DC | PRN
  Filled 2022-09-27: qty 90, 90d supply, fill #0

## 2022-09-27 MED ORDER — TRETINOIN 0.025 % EX CREA
1.0000 | TOPICAL_CREAM | Freq: Every evening | CUTANEOUS | 3 refills | Status: AC
  Filled 2022-09-27: qty 45, 30d supply, fill #0

## 2022-09-27 NOTE — Telephone Encounter (Signed)
  Pharmacy comment: patient wants generic, is this ok?

## 2022-09-27 NOTE — Telephone Encounter (Signed)
Okay for refill?  

## 2022-09-28 ENCOUNTER — Other Ambulatory Visit (HOSPITAL_COMMUNITY): Payer: Self-pay

## 2022-10-03 ENCOUNTER — Other Ambulatory Visit (HOSPITAL_COMMUNITY): Payer: Self-pay

## 2022-10-26 ENCOUNTER — Telehealth: Payer: Self-pay | Admitting: Adult Health

## 2022-10-26 NOTE — Telephone Encounter (Signed)
Left message to return phone call.

## 2022-10-26 NOTE — Telephone Encounter (Signed)
Pt states she wants her yearly blood work done.  Orders will need to be put in.

## 2022-10-27 ENCOUNTER — Ambulatory Visit
Admission: RE | Admit: 2022-10-27 | Discharge: 2022-10-27 | Disposition: A | Discharge status: Home / Self Care | Payer: 59 | Source: Ambulatory Visit | Attending: Internal Medicine | Admitting: Internal Medicine

## 2022-10-27 DIAGNOSIS — Z1231 Encounter for screening mammogram for malignant neoplasm of breast: Secondary | ICD-10-CM

## 2022-10-27 NOTE — Telephone Encounter (Signed)
Spoke to pt spouse and pt availability is limited to schedule lab appt. For yearly. Pt advised to go to [facility] via Spouse.

## 2022-10-28 ENCOUNTER — Ambulatory Visit
Admission: RE | Admit: 2022-10-28 | Discharge: 2022-10-28 | Disposition: A | Discharge status: Home / Self Care | Payer: 59 | Source: Ambulatory Visit | Attending: Internal Medicine | Admitting: Internal Medicine

## 2022-10-28 ENCOUNTER — Other Ambulatory Visit (INDEPENDENT_AMBULATORY_CARE_PROVIDER_SITE_OTHER): Payer: 59

## 2022-10-28 DIAGNOSIS — Z79899 Other long term (current) drug therapy: Secondary | ICD-10-CM

## 2022-10-28 DIAGNOSIS — E039 Hypothyroidism, unspecified: Secondary | ICD-10-CM | POA: Diagnosis not present

## 2022-10-28 DIAGNOSIS — E782 Mixed hyperlipidemia: Secondary | ICD-10-CM | POA: Diagnosis not present

## 2022-10-28 LAB — TSH: TSH: 0.14 u[IU]/mL — ABNORMAL LOW (ref 0.35–5.50)

## 2022-10-28 LAB — LIPID PANEL
Cholesterol: 204 mg/dL — ABNORMAL HIGH (ref 0–200)
HDL: 69.1 mg/dL (ref >39.00)
LDL Cholesterol: 117 mg/dL — ABNORMAL HIGH (ref 0–99)
NonHDL: 134.89
Total CHOL/HDL Ratio: 3
Triglycerides: 90 mg/dL (ref 0.0–149.0)
VLDL: 18 mg/dL (ref 0.0–40.0)

## 2022-10-29 NOTE — Progress Notes (Signed)
Tsh over suppressed usually reflecting  too high dose of thyroid replacement    Lipids improved .    will defer to [facility]  for advice about  adjusting dose of thyroid replacement

## 2022-11-01 ENCOUNTER — Other Ambulatory Visit: Payer: Self-pay | Admitting: Adult Health

## 2022-11-01 DIAGNOSIS — E039 Hypothyroidism, unspecified: Secondary | ICD-10-CM

## 2022-11-02 ENCOUNTER — Other Ambulatory Visit: Payer: Self-pay

## 2022-11-02 ENCOUNTER — Other Ambulatory Visit (HOSPITAL_COMMUNITY): Payer: Self-pay

## 2022-11-02 MED ORDER — LEVOTHYROXINE SODIUM 150 MCG PO TABS
150.0000 ug | ORAL_TABLET | Freq: Every day | ORAL | 0 refills | Status: DC
  Filled 2022-11-02: qty 30, 30d supply, fill #0

## 2022-11-09 ENCOUNTER — Encounter: Payer: Self-pay | Admitting: Adult Health

## 2022-11-09 NOTE — Telephone Encounter (Signed)
Please advise 

## 2022-11-10 ENCOUNTER — Other Ambulatory Visit: Payer: Self-pay | Admitting: Adult Health

## 2022-11-10 ENCOUNTER — Other Ambulatory Visit: Payer: 59

## 2022-12-01 ENCOUNTER — Other Ambulatory Visit (INDEPENDENT_AMBULATORY_CARE_PROVIDER_SITE_OTHER): Payer: 59

## 2022-12-01 DIAGNOSIS — E039 Hypothyroidism, unspecified: Secondary | ICD-10-CM

## 2022-12-01 LAB — TSH: TSH: 0.41 u[IU]/mL (ref 0.35–5.50)

## 2022-12-11 ENCOUNTER — Other Ambulatory Visit: Payer: Self-pay | Admitting: Internal Medicine

## 2022-12-11 MED ORDER — LOSARTAN POTASSIUM 50 MG PO TABS
50.0000 mg | ORAL_TABLET | Freq: Every day | ORAL | 2 refills | Status: DC
  Filled 2022-12-11: qty 90, 90d supply, fill #0
  Filled 2023-03-19: qty 90, 90d supply, fill #1
  Filled 2023-06-19: qty 90, 90d supply, fill #2

## 2022-12-12 ENCOUNTER — Other Ambulatory Visit (HOSPITAL_COMMUNITY): Payer: Self-pay

## 2022-12-12 ENCOUNTER — Other Ambulatory Visit: Payer: Self-pay

## 2022-12-13 ENCOUNTER — Inpatient Hospital Stay: Payer: 59 | Attending: Hematology and Oncology

## 2022-12-13 ENCOUNTER — Other Ambulatory Visit: Payer: Self-pay

## 2022-12-13 DIAGNOSIS — C9210 Chronic myeloid leukemia, BCR/ABL-positive, not having achieved remission: Secondary | ICD-10-CM | POA: Diagnosis not present

## 2022-12-13 LAB — COMPREHENSIVE METABOLIC PANEL
ALT: 14 U/L (ref 0–44)
AST: 14 U/L — ABNORMAL LOW (ref 15–41)
Albumin: 4.3 g/dL (ref 3.5–5.0)
Alkaline Phosphatase: 54 U/L (ref 38–126)
Anion gap: 8 (ref 5–15)
BUN: 18 mg/dL (ref 8–23)
CO2: 24 mmol/L (ref 22–32)
Calcium: 9.4 mg/dL (ref 8.9–10.3)
Chloride: 104 mmol/L (ref 98–111)
Creatinine, Ser: 0.7 mg/dL (ref 0.44–1.00)
GFR, Estimated: >60 mL/min (ref >60)
Glucose, Bld: 92 mg/dL (ref 70–99)
Potassium: 4.1 mmol/L (ref 3.5–5.1)
Sodium: 136 mmol/L (ref 135–145)
Total Bilirubin: 0.5 mg/dL (ref 0.3–1.2)
Total Protein: 7.1 g/dL (ref 6.5–8.1)

## 2022-12-13 LAB — CBC WITH DIFFERENTIAL/PLATELET
Abs Immature Granulocytes: 0.01 10*3/uL (ref 0.00–0.07)
Basophils Absolute: 0.1 10*3/uL (ref 0.0–0.1)
Basophils Relative: 1 %
Eosinophils Absolute: 0.2 10*3/uL (ref 0.0–0.5)
Eosinophils Relative: 4 %
HCT: 39 % (ref 36.0–46.0)
Hemoglobin: 13.5 g/dL (ref 12.0–15.0)
Immature Granulocytes: 0 %
Lymphocytes Relative: 27 %
Lymphs Abs: 1.3 10*3/uL (ref 0.7–4.0)
MCH: 31.1 pg (ref 26.0–34.0)
MCHC: 34.6 g/dL (ref 30.0–36.0)
MCV: 89.9 fL (ref 80.0–100.0)
Monocytes Absolute: 0.5 10*3/uL (ref 0.1–1.0)
Monocytes Relative: 10 %
Neutro Abs: 2.8 10*3/uL (ref 1.7–7.7)
Neutrophils Relative %: 58 %
Platelets: 275 10*3/uL (ref 150–400)
RBC: 4.34 MIL/uL (ref 3.87–5.11)
RDW: 12.5 % (ref 11.5–15.5)
WBC: 4.9 10*3/uL (ref 4.0–10.5)
nRBC: 0 % (ref 0.0–0.2)

## 2022-12-18 ENCOUNTER — Other Ambulatory Visit: Payer: Self-pay | Admitting: Adult Health

## 2022-12-19 ENCOUNTER — Telehealth: Payer: Self-pay

## 2022-12-19 LAB — BCR/ABL

## 2022-12-19 NOTE — Telephone Encounter (Signed)
Called and left below message. Ask her to call the office back for questions. 

## 2022-12-19 NOTE — Telephone Encounter (Signed)
-----   Message from Artis Delay, MD sent at 12/19/2022 12:05 PM EDT ----- Pls let her know tests are normal and she is still in remission

## 2022-12-20 ENCOUNTER — Other Ambulatory Visit (HOSPITAL_COMMUNITY): Payer: Self-pay

## 2022-12-20 ENCOUNTER — Other Ambulatory Visit: Payer: Self-pay | Admitting: Adult Health

## 2022-12-20 MED ORDER — LEVOTHYROXINE SODIUM 150 MCG PO TABS
150.0000 ug | ORAL_TABLET | Freq: Every day | ORAL | 0 refills | Status: DC
  Filled 2022-12-20: qty 30, 30d supply, fill #0

## 2022-12-20 MED ORDER — ZOLPIDEM TARTRATE 10 MG PO TABS: 10.0000 mg | ORAL_TABLET | Freq: Every evening | ORAL | 0 refills | Status: DC | PRN

## 2022-12-20 MED ORDER — ZOLPIDEM TARTRATE 10 MG PO TABS
10.0000 mg | ORAL_TABLET | Freq: Every evening | ORAL | 0 refills | Status: DC | PRN
  Filled 2022-12-20 – 2022-12-22 (×2): qty 90, 90d supply, fill #0

## 2022-12-22 ENCOUNTER — Other Ambulatory Visit (HOSPITAL_COMMUNITY): Payer: Self-pay

## 2022-12-22 ENCOUNTER — Other Ambulatory Visit: Payer: Self-pay | Admitting: Adult Health

## 2022-12-22 DIAGNOSIS — R300 Dysuria: Secondary | ICD-10-CM

## 2022-12-26 ENCOUNTER — Other Ambulatory Visit (HOSPITAL_COMMUNITY): Payer: Self-pay

## 2023-02-14 ENCOUNTER — Other Ambulatory Visit (HOSPITAL_COMMUNITY): Payer: Self-pay

## 2023-02-14 ENCOUNTER — Ambulatory Visit (INDEPENDENT_AMBULATORY_CARE_PROVIDER_SITE_OTHER): Payer: 59

## 2023-02-14 DIAGNOSIS — R350 Frequency of micturition: Secondary | ICD-10-CM | POA: Diagnosis not present

## 2023-02-14 DIAGNOSIS — R300 Dysuria: Secondary | ICD-10-CM

## 2023-02-14 LAB — POCT URINALYSIS DIPSTICK
Bilirubin, UA: NEGATIVE
Blood, UA: POSITIVE
Glucose, UA: NEGATIVE
Ketones, UA: NEGATIVE
Nitrite, UA: NEGATIVE
Protein, UA: POSITIVE — AB
Urobilinogen, UA: 0.2 E.U./dL
pH, UA: 6.5 (ref 5.0–8.0)

## 2023-02-14 MED ORDER — NITROFURANTOIN MONOHYD MACRO 100 MG PO CAPS
100.0000 mg | ORAL_CAPSULE | Freq: Two times a day (BID) | ORAL | 0 refills | Status: DC
  Filled 2023-02-14: qty 10, 5d supply, fill #0

## 2023-02-14 NOTE — Progress Notes (Signed)
Per orders of Dr. Caryl Never, pt ok'd to drop off urine for possible UTI. Orders placed and routed to Dr.Burchette for advise.

## 2023-02-14 NOTE — Progress Notes (Signed)
Patient relates couple day history of some urine frequency and mild burning with urination.  No fever.  Urine dipstick here today shows 3+ blood and 1+ protein and 3+ leukocytes.  Negative nitrite.  Urine culture sent.  Start Macrobid 1 twice daily for 5 days pending culture results.  Follow-up for any persistent or worsening symptoms  Kristian Covey MD [facility]

## 2023-02-14 NOTE — Addendum Note (Signed)
Addended by: Waymon Amato R on: 02/14/2023 09:23 AM   Modules accepted: Orders

## 2023-02-17 LAB — URINE CULTURE
MICRO NUMBER:: 15176513
SPECIMEN QUALITY:: ADEQUATE

## 2023-03-12 ENCOUNTER — Other Ambulatory Visit: Payer: Self-pay | Admitting: Adult Health

## 2023-03-14 ENCOUNTER — Telehealth: Payer: Self-pay

## 2023-03-14 ENCOUNTER — Inpatient Hospital Stay: Payer: 59 | Attending: Hematology and Oncology

## 2023-03-14 ENCOUNTER — Other Ambulatory Visit (HOSPITAL_COMMUNITY): Payer: Self-pay

## 2023-03-14 DIAGNOSIS — C9210 Chronic myeloid leukemia, BCR/ABL-positive, not having achieved remission: Secondary | ICD-10-CM | POA: Insufficient documentation

## 2023-03-14 DIAGNOSIS — Z79899 Other long term (current) drug therapy: Secondary | ICD-10-CM | POA: Insufficient documentation

## 2023-03-14 MED ORDER — LEVOTHYROXINE SODIUM 150 MCG PO TABS
150.0000 ug | ORAL_TABLET | Freq: Every day | ORAL | 2 refills | Status: DC
  Filled 2023-03-14: qty 30, 30d supply, fill #0
  Filled 2023-05-14: qty 30, 30d supply, fill #1
  Filled 2023-07-24: qty 30, 30d supply, fill #2

## 2023-03-14 NOTE — Telephone Encounter (Signed)
Called and left a message to call the office back to reschedule today's missed lab appt.

## 2023-03-16 ENCOUNTER — Telehealth: Payer: Self-pay | Admitting: Hematology and Oncology

## 2023-03-16 ENCOUNTER — Other Ambulatory Visit: Payer: Self-pay

## 2023-03-16 ENCOUNTER — Inpatient Hospital Stay: Payer: 59

## 2023-03-16 DIAGNOSIS — C9210 Chronic myeloid leukemia, BCR/ABL-positive, not having achieved remission: Secondary | ICD-10-CM

## 2023-03-16 DIAGNOSIS — Z79899 Other long term (current) drug therapy: Secondary | ICD-10-CM | POA: Diagnosis not present

## 2023-03-16 LAB — COMPREHENSIVE METABOLIC PANEL
ALT: 16 U/L (ref 0–44)
AST: 15 U/L (ref 15–41)
Albumin: 4.1 g/dL (ref 3.5–5.0)
Alkaline Phosphatase: 53 U/L (ref 38–126)
Anion gap: 5 (ref 5–15)
BUN: 15 mg/dL (ref 8–23)
CO2: 30 mmol/L (ref 22–32)
Calcium: 9.5 mg/dL (ref 8.9–10.3)
Chloride: 107 mmol/L (ref 98–111)
Creatinine, Ser: 0.87 mg/dL (ref 0.44–1.00)
GFR, Estimated: >60 mL/min (ref >60)
Glucose, Bld: 88 mg/dL (ref 70–99)
Potassium: 4 mmol/L (ref 3.5–5.1)
Sodium: 142 mmol/L (ref 135–145)
Total Bilirubin: 0.3 mg/dL (ref 0.3–1.2)
Total Protein: 7 g/dL (ref 6.5–8.1)

## 2023-03-16 LAB — CBC WITH DIFFERENTIAL/PLATELET
Abs Immature Granulocytes: 0.01 10*3/uL (ref 0.00–0.07)
Basophils Absolute: 0.1 10*3/uL (ref 0.0–0.1)
Basophils Relative: 1 %
Eosinophils Absolute: 0.2 10*3/uL (ref 0.0–0.5)
Eosinophils Relative: 3 %
HCT: 37.7 % (ref 36.0–46.0)
Hemoglobin: 13.1 g/dL (ref 12.0–15.0)
Immature Granulocytes: 0 %
Lymphocytes Relative: 27 %
Lymphs Abs: 1.4 10*3/uL (ref 0.7–4.0)
MCH: 30.8 pg (ref 26.0–34.0)
MCHC: 34.7 g/dL (ref 30.0–36.0)
MCV: 88.5 fL (ref 80.0–100.0)
Monocytes Absolute: 0.5 10*3/uL (ref 0.1–1.0)
Monocytes Relative: 9 %
Neutro Abs: 3.1 10*3/uL (ref 1.7–7.7)
Neutrophils Relative %: 60 %
Platelets: 253 10*3/uL (ref 150–400)
RBC: 4.26 MIL/uL (ref 3.87–5.11)
RDW: 13 % (ref 11.5–15.5)
WBC: 5.1 10*3/uL (ref 4.0–10.5)
nRBC: 0 % (ref 0.0–0.2)

## 2023-03-16 NOTE — Telephone Encounter (Signed)
Called to reschedule missed lab appointment, left a voicemail for patient.

## 2023-03-19 ENCOUNTER — Other Ambulatory Visit: Payer: Self-pay | Admitting: Adult Health

## 2023-03-19 ENCOUNTER — Other Ambulatory Visit (HOSPITAL_COMMUNITY): Payer: Self-pay

## 2023-03-21 ENCOUNTER — Telehealth: Payer: Self-pay | Admitting: Hematology and Oncology

## 2023-03-21 ENCOUNTER — Other Ambulatory Visit: Payer: Self-pay | Admitting: Adult Health

## 2023-03-21 ENCOUNTER — Other Ambulatory Visit: Payer: Self-pay

## 2023-03-21 ENCOUNTER — Other Ambulatory Visit (HOSPITAL_COMMUNITY): Payer: Self-pay

## 2023-03-21 MED ORDER — ZOLPIDEM TARTRATE 10 MG PO TABS
10.0000 mg | ORAL_TABLET | Freq: Every evening | ORAL | 0 refills | Status: DC | PRN
  Filled 2023-03-21: qty 90, 90d supply, fill #0

## 2023-03-21 NOTE — Telephone Encounter (Signed)
Per Dr.Gorsuch, called pt with message below. After several attempts, left vm with message below on pt personal cell. Advised to call office for any concerns.

## 2023-03-21 NOTE — Telephone Encounter (Signed)
-----   Message from Artis Delay sent at 03/21/2023 10:13 AM EDT ----- Pls call the patient. Her labs showed she is still in remission

## 2023-03-28 ENCOUNTER — Other Ambulatory Visit: Payer: Self-pay

## 2023-03-28 ENCOUNTER — Encounter: Payer: Self-pay | Admitting: Pharmacist

## 2023-03-28 ENCOUNTER — Other Ambulatory Visit: Payer: Self-pay | Admitting: Adult Health

## 2023-03-28 ENCOUNTER — Other Ambulatory Visit (HOSPITAL_COMMUNITY): Payer: Self-pay

## 2023-03-28 MED ORDER — METHYLPREDNISOLONE 4 MG PO TBPK
ORAL_TABLET | ORAL | 0 refills | Status: DC
Start: 2023-03-28 — End: 2023-09-28
  Filled 2023-03-28 (×2): qty 21, 6d supply, fill #0

## 2023-03-29 ENCOUNTER — Other Ambulatory Visit: Payer: Self-pay

## 2023-04-03 ENCOUNTER — Other Ambulatory Visit (HOSPITAL_COMMUNITY): Payer: Self-pay

## 2023-05-14 ENCOUNTER — Other Ambulatory Visit (HOSPITAL_COMMUNITY): Payer: Self-pay

## 2023-05-23 DIAGNOSIS — D485 Neoplasm of uncertain behavior of skin: Secondary | ICD-10-CM | POA: Diagnosis not present

## 2023-05-23 DIAGNOSIS — L821 Other seborrheic keratosis: Secondary | ICD-10-CM | POA: Diagnosis not present

## 2023-06-12 ENCOUNTER — Inpatient Hospital Stay: Payer: 59 | Attending: Hematology and Oncology

## 2023-06-12 DIAGNOSIS — Z79899 Other long term (current) drug therapy: Secondary | ICD-10-CM | POA: Diagnosis not present

## 2023-06-12 DIAGNOSIS — C9210 Chronic myeloid leukemia, BCR/ABL-positive, not having achieved remission: Secondary | ICD-10-CM | POA: Diagnosis not present

## 2023-06-12 DIAGNOSIS — R300 Dysuria: Secondary | ICD-10-CM

## 2023-06-12 LAB — CBC WITH DIFFERENTIAL/PLATELET
Abs Immature Granulocytes: 0.01 10*3/uL (ref 0.00–0.07)
Basophils Absolute: 0.1 10*3/uL (ref 0.0–0.1)
Basophils Relative: 2 %
Eosinophils Absolute: 0.2 10*3/uL (ref 0.0–0.5)
Eosinophils Relative: 4 %
HCT: 38.4 % (ref 36.0–46.0)
Hemoglobin: 13.2 g/dL (ref 12.0–15.0)
Immature Granulocytes: 0 %
Lymphocytes Relative: 31 %
Lymphs Abs: 1.4 10*3/uL (ref 0.7–4.0)
MCH: 31.1 pg (ref 26.0–34.0)
MCHC: 34.4 g/dL (ref 30.0–36.0)
MCV: 90.4 fL (ref 80.0–100.0)
Monocytes Absolute: 0.5 10*3/uL (ref 0.1–1.0)
Monocytes Relative: 12 %
Neutro Abs: 2.3 10*3/uL (ref 1.7–7.7)
Neutrophils Relative %: 51 %
Platelets: 275 10*3/uL (ref 150–400)
RBC: 4.25 MIL/uL (ref 3.87–5.11)
RDW: 12.6 % (ref 11.5–15.5)
WBC: 4.4 10*3/uL (ref 4.0–10.5)
nRBC: 0 % (ref 0.0–0.2)

## 2023-06-12 LAB — COMPREHENSIVE METABOLIC PANEL
ALT: 16 U/L (ref 0–44)
AST: 15 U/L (ref 15–41)
Albumin: 4 g/dL (ref 3.5–5.0)
Alkaline Phosphatase: 55 U/L (ref 38–126)
Anion gap: 10 (ref 5–15)
BUN: 13 mg/dL (ref 8–23)
CO2: 24 mmol/L (ref 22–32)
Calcium: 9.5 mg/dL (ref 8.9–10.3)
Chloride: 105 mmol/L (ref 98–111)
Creatinine, Ser: 0.75 mg/dL (ref 0.44–1.00)
GFR, Estimated: >60 mL/min (ref >60)
Glucose, Bld: 101 mg/dL — ABNORMAL HIGH (ref 70–99)
Potassium: 4.5 mmol/L (ref 3.5–5.1)
Sodium: 139 mmol/L (ref 135–145)
Total Bilirubin: 0.5 mg/dL (ref <1.2)
Total Protein: 6.8 g/dL (ref 6.5–8.1)

## 2023-06-13 ENCOUNTER — Other Ambulatory Visit (HOSPITAL_COMMUNITY): Payer: Self-pay

## 2023-06-13 ENCOUNTER — Other Ambulatory Visit: Payer: Self-pay | Admitting: Adult Health

## 2023-06-13 MED ORDER — ZOLPIDEM TARTRATE 10 MG PO TABS
10.0000 mg | ORAL_TABLET | Freq: Every evening | ORAL | 0 refills | Status: DC | PRN
Start: 2023-06-13 — End: 2023-09-11
  Filled 2023-06-13 – 2023-06-19 (×3): qty 90, 90d supply, fill #0

## 2023-06-13 NOTE — Telephone Encounter (Signed)
Okay for refill?  

## 2023-06-15 ENCOUNTER — Other Ambulatory Visit (HOSPITAL_COMMUNITY): Payer: Self-pay

## 2023-06-15 ENCOUNTER — Telehealth: Payer: Self-pay

## 2023-06-15 LAB — BCR/ABL

## 2023-06-15 NOTE — Telephone Encounter (Signed)
Patient notified that she is still in remission.  Patient verbalized an understanding of the information and expressed appreciation for the call.

## 2023-06-15 NOTE — Telephone Encounter (Signed)
-----   Message from Artis Delay sent at 06/15/2023  4:15 PM EST ----- Regarding: non urgent Pls call her, let her know she is still in remission

## 2023-06-19 ENCOUNTER — Other Ambulatory Visit (HOSPITAL_COMMUNITY): Payer: Self-pay

## 2023-07-02 ENCOUNTER — Other Ambulatory Visit: Payer: Self-pay | Admitting: Internal Medicine

## 2023-07-02 MED ORDER — VERAPAMIL HCL ER 240 MG PO TBCR
240.0000 mg | EXTENDED_RELEASE_TABLET | Freq: Every day | ORAL | 3 refills | Status: DC
  Filled 2023-07-02: qty 90, 90d supply, fill #0
  Filled 2023-09-29: qty 90, 90d supply, fill #1
  Filled 2023-12-31: qty 90, 90d supply, fill #2
  Filled 2024-03-31: qty 90, 90d supply, fill #3

## 2023-07-03 ENCOUNTER — Other Ambulatory Visit (HOSPITAL_COMMUNITY): Payer: Self-pay

## 2023-07-24 ENCOUNTER — Other Ambulatory Visit: Payer: Self-pay

## 2023-08-07 ENCOUNTER — Other Ambulatory Visit: Payer: Self-pay | Admitting: Adult Health

## 2023-08-07 ENCOUNTER — Other Ambulatory Visit (HOSPITAL_COMMUNITY): Payer: Self-pay

## 2023-08-08 ENCOUNTER — Other Ambulatory Visit (HOSPITAL_COMMUNITY): Payer: Self-pay

## 2023-08-08 ENCOUNTER — Encounter (HOSPITAL_COMMUNITY): Payer: Self-pay

## 2023-08-10 ENCOUNTER — Other Ambulatory Visit: Payer: Self-pay

## 2023-08-10 ENCOUNTER — Other Ambulatory Visit (HOSPITAL_COMMUNITY): Payer: Self-pay

## 2023-08-10 MED ORDER — LEVOTHYROXINE SODIUM 150 MCG PO TABS
150.0000 ug | ORAL_TABLET | Freq: Every day | ORAL | 2 refills | Status: DC
  Filled 2023-08-10 (×2): qty 30, 30d supply, fill #0
  Filled 2023-11-29: qty 30, 30d supply, fill #1
  Filled 2024-01-28: qty 30, 30d supply, fill #2

## 2023-08-14 ENCOUNTER — Other Ambulatory Visit (HOSPITAL_COMMUNITY): Payer: Self-pay

## 2023-08-15 ENCOUNTER — Other Ambulatory Visit: Payer: Self-pay | Admitting: Adult Health

## 2023-08-15 ENCOUNTER — Other Ambulatory Visit (HOSPITAL_COMMUNITY): Payer: Self-pay

## 2023-08-15 ENCOUNTER — Encounter (HOSPITAL_COMMUNITY): Payer: Self-pay

## 2023-08-16 ENCOUNTER — Other Ambulatory Visit: Payer: Self-pay | Admitting: Adult Health

## 2023-08-16 ENCOUNTER — Other Ambulatory Visit (HOSPITAL_COMMUNITY): Payer: Self-pay

## 2023-08-16 MED ORDER — LEVOTHYROXINE SODIUM 175 MCG PO TABS
175.0000 ug | ORAL_TABLET | ORAL | 3 refills | Status: DC
  Filled 2023-08-16: qty 15, 30d supply, fill #0
  Filled 2023-09-11: qty 15, 30d supply, fill #1
  Filled 2023-10-08: qty 15, 30d supply, fill #2
  Filled 2023-11-05: qty 15, 30d supply, fill #3
  Filled 2023-12-04: qty 15, 30d supply, fill #4
  Filled 2024-01-01: qty 15, 30d supply, fill #5
  Filled 2024-02-10: qty 15, 30d supply, fill #6
  Filled 2024-03-12: qty 15, 30d supply, fill #7
  Filled 2024-04-08: qty 45, 90d supply, fill #8
  Filled 2024-07-14: qty 15, 30d supply, fill #9

## 2023-08-24 ENCOUNTER — Other Ambulatory Visit: Payer: Self-pay | Admitting: Adult Health

## 2023-08-24 DIAGNOSIS — H9193 Unspecified hearing loss, bilateral: Secondary | ICD-10-CM

## 2023-08-29 ENCOUNTER — Encounter: Payer: Self-pay | Admitting: Adult Health

## 2023-09-05 ENCOUNTER — Encounter: Payer: Self-pay | Admitting: Adult Health

## 2023-09-11 ENCOUNTER — Other Ambulatory Visit: Payer: Self-pay | Admitting: Adult Health

## 2023-09-11 ENCOUNTER — Other Ambulatory Visit: Payer: Self-pay | Admitting: Hematology and Oncology

## 2023-09-11 DIAGNOSIS — C9210 Chronic myeloid leukemia, BCR/ABL-positive, not having achieved remission: Secondary | ICD-10-CM

## 2023-09-12 ENCOUNTER — Inpatient Hospital Stay: Payer: 59

## 2023-09-12 ENCOUNTER — Other Ambulatory Visit (HOSPITAL_COMMUNITY): Payer: Self-pay

## 2023-09-12 ENCOUNTER — Other Ambulatory Visit: Payer: Self-pay

## 2023-09-12 MED ORDER — ZOLPIDEM TARTRATE 10 MG PO TABS
10.0000 mg | ORAL_TABLET | Freq: Every evening | ORAL | 0 refills | Status: DC | PRN
  Filled 2023-09-12 – 2023-09-15 (×2): qty 90, 90d supply, fill #0

## 2023-09-12 NOTE — Telephone Encounter (Signed)
 Okay for refill?

## 2023-09-15 ENCOUNTER — Other Ambulatory Visit (HOSPITAL_COMMUNITY): Payer: Self-pay

## 2023-09-18 ENCOUNTER — Inpatient Hospital Stay: Payer: 59 | Attending: Hematology and Oncology

## 2023-09-18 DIAGNOSIS — C9210 Chronic myeloid leukemia, BCR/ABL-positive, not having achieved remission: Secondary | ICD-10-CM | POA: Diagnosis present

## 2023-09-18 DIAGNOSIS — Z79899 Other long term (current) drug therapy: Secondary | ICD-10-CM | POA: Insufficient documentation

## 2023-09-18 DIAGNOSIS — C9211 Chronic myeloid leukemia, BCR/ABL-positive, in remission: Secondary | ICD-10-CM | POA: Insufficient documentation

## 2023-09-18 LAB — COMPREHENSIVE METABOLIC PANEL
ALT: 15 U/L (ref 0–44)
AST: 14 U/L — ABNORMAL LOW (ref 15–41)
Albumin: 4.1 g/dL (ref 3.5–5.0)
Alkaline Phosphatase: 53 U/L (ref 38–126)
Anion gap: 6 (ref 5–15)
BUN: 18 mg/dL (ref 8–23)
CO2: 26 mmol/L (ref 22–32)
Calcium: 9.3 mg/dL (ref 8.9–10.3)
Chloride: 104 mmol/L (ref 98–111)
Creatinine, Ser: 0.73 mg/dL (ref 0.44–1.00)
GFR, Estimated: >60 mL/min (ref >60)
Glucose, Bld: 94 mg/dL (ref 70–99)
Potassium: 4 mmol/L (ref 3.5–5.1)
Sodium: 136 mmol/L (ref 135–145)
Total Bilirubin: 0.5 mg/dL (ref 0.0–1.2)
Total Protein: 6.9 g/dL (ref 6.5–8.1)

## 2023-09-18 LAB — CBC WITH DIFFERENTIAL/PLATELET
Abs Immature Granulocytes: 0.01 10*3/uL (ref 0.00–0.07)
Basophils Absolute: 0.1 10*3/uL (ref 0.0–0.1)
Basophils Relative: 1 %
Eosinophils Absolute: 0.2 10*3/uL (ref 0.0–0.5)
Eosinophils Relative: 4 %
HCT: 39.5 % (ref 36.0–46.0)
Hemoglobin: 13.5 g/dL (ref 12.0–15.0)
Immature Granulocytes: 0 %
Lymphocytes Relative: 28 %
Lymphs Abs: 1.2 10*3/uL (ref 0.7–4.0)
MCH: 30.8 pg (ref 26.0–34.0)
MCHC: 34.2 g/dL (ref 30.0–36.0)
MCV: 90.2 fL (ref 80.0–100.0)
Monocytes Absolute: 0.5 10*3/uL (ref 0.1–1.0)
Monocytes Relative: 11 %
Neutro Abs: 2.5 10*3/uL (ref 1.7–7.7)
Neutrophils Relative %: 56 %
Platelets: 278 10*3/uL (ref 150–400)
RBC: 4.38 MIL/uL (ref 3.87–5.11)
RDW: 12 % (ref 11.5–15.5)
WBC: 4.5 10*3/uL (ref 4.0–10.5)
nRBC: 0 % (ref 0.0–0.2)

## 2023-09-22 ENCOUNTER — Inpatient Hospital Stay: Payer: 59 | Admitting: Hematology and Oncology

## 2023-09-22 LAB — BCR-ABL1, CML/ALL, PCR, QUANT
E1A2 Transcript: <0.0032 %
Interpretation (BCRAL):: NEGATIVE
b2a2 transcript: <0.0032 %
b3a2 transcript: <0.0032 %

## 2023-09-27 ENCOUNTER — Other Ambulatory Visit: Payer: Self-pay | Admitting: Family

## 2023-09-28 ENCOUNTER — Encounter: Payer: Self-pay | Admitting: Hematology and Oncology

## 2023-09-28 ENCOUNTER — Inpatient Hospital Stay (HOSPITAL_BASED_OUTPATIENT_CLINIC_OR_DEPARTMENT_OTHER): Payer: 59 | Admitting: Hematology and Oncology

## 2023-09-28 VITALS — BP 131/64 | HR 71 | Resp 18 | Ht 67.0 in | Wt 163.0 lb

## 2023-09-28 DIAGNOSIS — C9210 Chronic myeloid leukemia, BCR/ABL-positive, not having achieved remission: Secondary | ICD-10-CM | POA: Diagnosis not present

## 2023-09-28 DIAGNOSIS — C9211 Chronic myeloid leukemia, BCR/ABL-positive, in remission: Secondary | ICD-10-CM | POA: Diagnosis not present

## 2023-09-28 DIAGNOSIS — Z79899 Other long term (current) drug therapy: Secondary | ICD-10-CM | POA: Diagnosis not present

## 2023-09-28 NOTE — Assessment & Plan Note (Signed)
She is doing well She has no signs of CML relapse since discontinuation of treatment in January 2023 Her most recent molecular study confirm she is still in complete remission with nondetectable BCR/ABL Per guidelines, she needs to complete surveillance blood work every 3 months Assuming she stay in remission, she would not need to restart treatment I will see her once a year She is in agreement with the plan of care

## 2023-09-28 NOTE — Progress Notes (Signed)
[facility] OFFICE PROGRESS NOTE  Patient Care Team: Shirline Frees, NP as PCP - General (Family Medicine) Dorisann Frames, MD (Endocrinology) Lurena Nida, MD (Internal Medicine) Osborn Coho, MD as Attending Physician (Obstetrics and Gynecology) Artis Delay, MD as Consulting Physician (Hematology and Oncology) Coralyn Helling, MD (Inactive) as Consulting Physician (Pulmonary Disease)  ASSESSMENT & PLAN:  CML (chronic myeloid leukemia) ([facility]) She is doing well She has no signs of CML relapse since discontinuation of treatment in January 2023 Her most recent molecular study confirm she is still in complete remission with nondetectable BCR/ABL Per guidelines, she needs to complete surveillance blood work every 3 months Assuming she stay in remission, she would not need to restart treatment I will see her once a year She is in agreement with the plan of care  Orders Placed This Encounter  Procedures   CBC with Differential/Platelet    Standing Status:   Standing    Number of Occurrences:   22    Expiration Date:   09/27/2024    All questions were answered. The patient knows to call the clinic with any problems, questions or concerns. The total time spent in the appointment was 20 minutes encounter with patients including review of chart and various tests results, discussions about plan of care and coordination of care plan   Artis Delay, MD 09/28/2023 12:38 PM  INTERVAL HISTORY: Please see below for problem oriented charting. she returns for surveillance follow-up for history of CML on active surveillance She is doing well We discussed recent test results We discussed future follow-up  REVIEW OF SYSTEMS:   Constitutional: Denies fevers, chills or abnormal weight loss Eyes: Denies blurriness of vision Ears, nose, mouth, throat, and face: Denies mucositis or sore throat Respiratory: Denies cough, dyspnea or wheezes Cardiovascular: Denies palpitation, chest  discomfort or lower extremity swelling Gastrointestinal:  Denies nausea, heartburn or change in bowel habits Skin: Denies abnormal skin rashes Lymphatics: Denies new lymphadenopathy or easy bruising Neurological:Denies numbness, tingling or new weaknesses Behavioral/Psych: Mood is stable, no new changes  All other systems were reviewed with the patient and are negative.  I have reviewed the past medical history, past surgical history, social history and family history with the patient and they are unchanged from previous note.  ALLERGIES:  has no known allergies.  MEDICATIONS:  Current Outpatient Medications  Medication Sig Dispense Refill   levothyroxine (SYNTHROID) 150 MCG tablet Take 1 tablet (150 mcg total) by mouth daily. 30 tablet 2   levothyroxine (SYNTHROID) 175 MCG tablet Take 1 tablet (175 mcg total) by mouth every other day. 45 tablet 3   losartan (COZAAR) 50 MG tablet Take 1 tablet (50 mg total) by mouth daily. 90 tablet 2   tretinoin (RETIN-A) 0.025 % cream Apply a pearl sized amount topically to face every evening. 45 g 3   verapamil (CALAN-SR) 240 MG CR tablet Take 1 tablet (240 mg total) by mouth at bedtime. 90 tablet 3   zolpidem (AMBIEN) 10 MG tablet Take 1 tablet (10 mg total) by mouth at bedtime as needed for sleep 90 tablet 0   No current facility-administered medications for this visit.    SUMMARY OF ONCOLOGIC HISTORY: Oncology History  CML (chronic myeloid leukemia) ([facility])  05/15/2015 Bone Marrow Biopsy   BM biopsy and FISH confirmed CML [id number]   05/15/2015 - 05/20/2015 Chemotherapy   She started taking Hydrea 1500 mg daily   05/15/2015 Tumor Marker   BCR/ABL b2a2 87.5%, IS 49%   05/21/2015 -  08/16/2021 Chemotherapy   She started taking Sprycel 100 mg daily   06/29/2015 Adverse Reaction   She had diarrhea. Dose of Srycel is reduced to 50 mg daily   09/10/2015 Tumor Marker   BCR/ABL b2a2 0.21%, IS 0.1743%   12/11/2015 Pathology Results   BCR/ABL b2a2  0.19%, IS 0.1577%   04/13/2016 Pathology Results   BCR/ABL b2a2 0.29%, IS 0.2407%   07/14/2016 Pathology Results   BCR/ABL b2a2 0.004%, IS 0.0092%. She is in MMR   07/21/2016 Miscellaneous   Dose of Sprycel is reduced to 50 mg daily   09/08/2016 Pathology Results   BCR/ABL b2a2 0.001%, IS 0.0023%. She is in MMR   06/14/2017 Pathology Results   BCR/ABL not detected. She is in MMR   10/11/2017 Pathology Results   BCR/ABL is not detected. She is in MMR   02/14/2018 Pathology Results   BCR/ABL is not detected. She is in MMR   05/17/2018 Pathology Results   BCR/ABL is not detected. She is in MMR   08/22/2018 Pathology Results   BCR/ABL is not detected. She is in MMR   11/30/2018 Pathology Results   BCR/ABL is not detected. She is in MMR   06/03/2019 Pathology Results   BCR/ABL is not detected. She is in MMR   09/03/2019 Pathology Results   BCR/ABL is not detected. She is in MMR   12/05/2019 Pathology Results   BCR/ABL is not detected. She is in MMR   06/01/2020 Pathology Results   BCR/ABL is not detected. She is in MMR   09/01/2020 Pathology Results   BCR/ABL is not detected. She is in MMR   12/21/2020 Pathology Results   BCR/ABL is not detected. She is in MMR   04/06/2021 Pathology Results   BCR/ABL is not detected. She is in MMR   08/11/2021 Pathology Results   BCR/ABL not detected. She is in MMR   09/17/2021 Pathology Results   BCR/ABL not detected. She is in MMR   10/15/2021 Pathology Results   BCR/ABL not detected. She is in MMR   11/12/2021 Pathology Results   BCR/ABL not detected. She is in MMR   12/10/2021 Pathology Results   BCR/ABL not detected. She is in MMR   01/10/2022 Pathology Results   BCR/ABL not detected. She is in MMR   02/09/2022 Pathology Results   BCR/ABL not detected. She is in MMR   03/16/2022 Pathology Results   BCR/ABL not detected. She is in MMR   04/19/2022 Pathology Results   BCR/ABL not detected. She is in MMR    05/11/2022 Pathology Results    BCR/ABL is not detected. She is in MMR    06/14/2022 Pathology Results   BCR/ABL is not detected. She is in MMR    08/03/2022 Pathology Results   BCR/ABL is not detected. She is in MMR     08/31/2022 Pathology Results   BCR/ABL not detected. She is in MMR    12/13/2022 Pathology Results   BCR/ABL not detected. She is in MMR    03/16/2023 Pathology Results   BCR/ABL not detected. She is in MMR    06/12/2023 Pathology Results   BCR/ABL not detected. She is in MMR    09/18/2023 Pathology Results   BCR/ABL not detected. She is in MMR      PHYSICAL EXAMINATION: ECOG PERFORMANCE STATUS: 0 - Asymptomatic  Vitals:   09/28/23 1026  BP: 131/64  Pulse: 71  Resp: 18  SpO2: 100%   Filed Weights   09/28/23 1026  Weight:  163 lb (73.9 kg)    GENERAL:alert, no distress and comfortable  LABORATORY DATA:  I have reviewed the data as listed    Component Value Date/Time   NA 136 09/18/2023 0805   NA 138 02/07/2017 0836   K 4.0 09/18/2023 0805   K 4.2 02/07/2017 0836   CL 104 09/18/2023 0805   CO2 26 09/18/2023 0805   CO2 23 02/07/2017 0836   GLUCOSE 94 09/18/2023 0805   GLUCOSE 85 02/07/2017 0836   BUN 18 09/18/2023 0805   BUN 15.5 02/07/2017 0836   CREATININE 0.73 09/18/2023 0805   CREATININE 0.9 02/07/2017 0836   CALCIUM 9.3 09/18/2023 0805   CALCIUM 9.5 02/07/2017 0836   PROT 6.9 09/18/2023 0805   PROT 7.0 02/07/2017 0836   ALBUMIN 4.1 09/18/2023 0805   ALBUMIN 3.8 02/07/2017 0836   AST 14 (L) 09/18/2023 0805   AST 25 02/07/2017 0836   ALT 15 09/18/2023 0805   ALT 36 02/07/2017 0836   ALKPHOS 53 09/18/2023 0805   ALKPHOS 55 02/07/2017 0836   BILITOT 0.5 09/18/2023 0805   BILITOT 0.31 02/07/2017 0836   GFRNONAA >60 09/18/2023 0805   GFRAA >60 03/03/2020 0747    No results found for: "SPEP", "UPEP"  Lab Results  Component Value Date   WBC 4.5 09/18/2023   NEUTROABS 2.5 09/18/2023   HGB 13.5 09/18/2023   HCT 39.5 09/18/2023   MCV 90.2 09/18/2023   PLT 278  09/18/2023      Chemistry      Component Value Date/Time   NA 136 09/18/2023 0805   NA 138 02/07/2017 0836   K 4.0 09/18/2023 0805   K 4.2 02/07/2017 0836   CL 104 09/18/2023 0805   CO2 26 09/18/2023 0805   CO2 23 02/07/2017 0836   BUN 18 09/18/2023 0805   BUN 15.5 02/07/2017 0836   CREATININE 0.73 09/18/2023 0805   CREATININE 0.9 02/07/2017 0836      Component Value Date/Time   CALCIUM 9.3 09/18/2023 0805   CALCIUM 9.5 02/07/2017 0836   ALKPHOS 53 09/18/2023 0805   ALKPHOS 55 02/07/2017 0836   AST 14 (L) 09/18/2023 0805   AST 25 02/07/2017 0836   ALT 15 09/18/2023 0805   ALT 36 02/07/2017 0836   BILITOT 0.5 09/18/2023 0805   BILITOT 0.31 02/07/2017 0836

## 2023-09-29 ENCOUNTER — Other Ambulatory Visit (HOSPITAL_COMMUNITY): Payer: Self-pay

## 2023-09-29 ENCOUNTER — Other Ambulatory Visit: Payer: Self-pay | Admitting: Adult Health

## 2023-09-29 MED ORDER — LOSARTAN POTASSIUM 50 MG PO TABS
50.0000 mg | ORAL_TABLET | Freq: Every day | ORAL | 3 refills | Status: DC
  Filled 2023-09-29: qty 90, 90d supply, fill #0
  Filled 2023-12-31: qty 90, 90d supply, fill #1
  Filled 2024-04-08: qty 90, 90d supply, fill #2
  Filled 2024-07-14: qty 90, 90d supply, fill #3

## 2023-10-06 ENCOUNTER — Encounter: Payer: Self-pay | Admitting: Adult Health

## 2023-10-09 ENCOUNTER — Other Ambulatory Visit (HOSPITAL_COMMUNITY): Payer: Self-pay

## 2023-10-12 ENCOUNTER — Institutional Professional Consult (permissible substitution) (INDEPENDENT_AMBULATORY_CARE_PROVIDER_SITE_OTHER): Payer: 59

## 2023-10-12 ENCOUNTER — Ambulatory Visit (INDEPENDENT_AMBULATORY_CARE_PROVIDER_SITE_OTHER): Admitting: Audiology

## 2023-11-06 ENCOUNTER — Other Ambulatory Visit (HOSPITAL_COMMUNITY): Payer: Self-pay

## 2023-11-15 ENCOUNTER — Other Ambulatory Visit: Payer: Self-pay

## 2023-11-15 ENCOUNTER — Other Ambulatory Visit (HOSPITAL_COMMUNITY): Payer: Self-pay

## 2023-11-15 DIAGNOSIS — Z124 Encounter for screening for malignant neoplasm of cervix: Secondary | ICD-10-CM | POA: Diagnosis not present

## 2023-11-15 DIAGNOSIS — Z01419 Encounter for gynecological examination (general) (routine) without abnormal findings: Secondary | ICD-10-CM | POA: Diagnosis not present

## 2023-11-15 DIAGNOSIS — Z1231 Encounter for screening mammogram for malignant neoplasm of breast: Secondary | ICD-10-CM | POA: Diagnosis not present

## 2023-11-15 DIAGNOSIS — N941 Unspecified dyspareunia: Secondary | ICD-10-CM | POA: Diagnosis not present

## 2023-11-15 MED ORDER — PREMARIN 0.625 MG/GM VA CREA
0.5000 | TOPICAL_CREAM | Freq: Every day | VAGINAL | 3 refills | Status: AC
  Filled 2023-11-15: qty 30, 90d supply, fill #0

## 2023-11-29 ENCOUNTER — Other Ambulatory Visit (HOSPITAL_COMMUNITY): Payer: Self-pay

## 2023-11-30 DIAGNOSIS — H903 Sensorineural hearing loss, bilateral: Secondary | ICD-10-CM | POA: Diagnosis not present

## 2023-11-30 DIAGNOSIS — H9313 Tinnitus, bilateral: Secondary | ICD-10-CM | POA: Diagnosis not present

## 2023-12-04 ENCOUNTER — Other Ambulatory Visit: Payer: Self-pay

## 2023-12-04 ENCOUNTER — Other Ambulatory Visit (HOSPITAL_COMMUNITY): Payer: Self-pay

## 2023-12-11 ENCOUNTER — Other Ambulatory Visit: Payer: Self-pay

## 2023-12-24 ENCOUNTER — Other Ambulatory Visit: Payer: Self-pay | Admitting: Adult Health

## 2023-12-25 ENCOUNTER — Inpatient Hospital Stay: Payer: 59 | Attending: Hematology and Oncology

## 2023-12-25 DIAGNOSIS — C9210 Chronic myeloid leukemia, BCR/ABL-positive, not having achieved remission: Secondary | ICD-10-CM

## 2023-12-25 DIAGNOSIS — C9211 Chronic myeloid leukemia, BCR/ABL-positive, in remission: Secondary | ICD-10-CM | POA: Insufficient documentation

## 2023-12-25 LAB — CBC WITH DIFFERENTIAL/PLATELET
Abs Immature Granulocytes: 0.02 10*3/uL (ref 0.00–0.07)
Basophils Absolute: 0.1 10*3/uL (ref 0.0–0.1)
Basophils Relative: 1 %
Eosinophils Absolute: 0.2 10*3/uL (ref 0.0–0.5)
Eosinophils Relative: 4 %
HCT: 38.2 % (ref 36.0–46.0)
Hemoglobin: 13.4 g/dL (ref 12.0–15.0)
Immature Granulocytes: 0 %
Lymphocytes Relative: 27 %
Lymphs Abs: 1.5 10*3/uL (ref 0.7–4.0)
MCH: 30.7 pg (ref 26.0–34.0)
MCHC: 35.1 g/dL (ref 30.0–36.0)
MCV: 87.4 fL (ref 80.0–100.0)
Monocytes Absolute: 0.6 10*3/uL (ref 0.1–1.0)
Monocytes Relative: 10 %
Neutro Abs: 3.3 10*3/uL (ref 1.7–7.7)
Neutrophils Relative %: 58 %
Platelets: 273 10*3/uL (ref 150–400)
RBC: 4.37 MIL/uL (ref 3.87–5.11)
RDW: 12.3 % (ref 11.5–15.5)
WBC: 5.6 10*3/uL (ref 4.0–10.5)
nRBC: 0 % (ref 0.0–0.2)

## 2023-12-25 LAB — COMPREHENSIVE METABOLIC PANEL WITH GFR
ALT: 23 U/L (ref 0–44)
AST: 17 U/L (ref 15–41)
Albumin: 4.1 g/dL (ref 3.5–5.0)
Alkaline Phosphatase: 53 U/L (ref 38–126)
Anion gap: 7 (ref 5–15)
BUN: 16 mg/dL (ref 8–23)
CO2: 24 mmol/L (ref 22–32)
Calcium: 9.5 mg/dL (ref 8.9–10.3)
Chloride: 106 mmol/L (ref 98–111)
Creatinine, Ser: 0.79 mg/dL (ref 0.44–1.00)
GFR, Estimated: >60 mL/min (ref >60)
Glucose, Bld: 81 mg/dL (ref 70–99)
Potassium: 3.9 mmol/L (ref 3.5–5.1)
Sodium: 137 mmol/L (ref 135–145)
Total Bilirubin: 0.5 mg/dL (ref 0.0–1.2)
Total Protein: 6.9 g/dL (ref 6.5–8.1)

## 2023-12-26 ENCOUNTER — Other Ambulatory Visit (HOSPITAL_COMMUNITY): Payer: Self-pay

## 2023-12-26 ENCOUNTER — Other Ambulatory Visit: Payer: Self-pay | Admitting: Adult Health

## 2023-12-26 MED ORDER — ZOLPIDEM TARTRATE 10 MG PO TABS
10.0000 mg | ORAL_TABLET | Freq: Every evening | ORAL | 1 refills | Status: DC | PRN
  Filled 2023-12-26: qty 90, 90d supply, fill #0
  Filled 2024-03-24: qty 90, 90d supply, fill #1

## 2023-12-28 ENCOUNTER — Telehealth: Payer: Self-pay

## 2023-12-28 LAB — BCR-ABL1, CML/ALL, PCR, QUANT
E1A2 Transcript: <0.0032 %
Interpretation (BCRAL):: NEGATIVE
b2a2 transcript: <0.0032 %
b3a2 transcript: <0.0032 %

## 2023-12-28 NOTE — Telephone Encounter (Signed)
-----   Message from Almeda Jacobs sent at 12/28/2023 12:54 PM EDT ----- Pls call her and let her know she is still in remission

## 2023-12-28 NOTE — Telephone Encounter (Signed)
 Patient given the following message.  Patient verbalized an understanding of the information and voiced appreciation for the call.

## 2024-01-01 ENCOUNTER — Other Ambulatory Visit (HOSPITAL_COMMUNITY): Payer: Self-pay

## 2024-01-02 ENCOUNTER — Other Ambulatory Visit (HOSPITAL_COMMUNITY): Payer: Self-pay

## 2024-01-29 ENCOUNTER — Other Ambulatory Visit (HOSPITAL_COMMUNITY): Payer: Self-pay

## 2024-02-12 ENCOUNTER — Other Ambulatory Visit (HOSPITAL_COMMUNITY): Payer: Self-pay

## 2024-02-12 ENCOUNTER — Other Ambulatory Visit: Payer: Self-pay | Admitting: Family Medicine

## 2024-02-12 DIAGNOSIS — L237 Allergic contact dermatitis due to plants, except food: Secondary | ICD-10-CM

## 2024-02-12 MED ORDER — PREDNISONE 10 MG PO TABS
ORAL_TABLET | ORAL | 0 refills | Status: AC
  Filled 2024-02-12: qty 21, 6d supply, fill #0

## 2024-02-12 NOTE — Progress Notes (Signed)
 Over the weekend, patient obtained a rash that is similar to poison ivy.  She thinks it came from the dog playing in the area when it present. Primary care is not in office.

## 2024-02-24 DIAGNOSIS — M79671 Pain in right foot: Secondary | ICD-10-CM | POA: Diagnosis not present

## 2024-03-12 ENCOUNTER — Other Ambulatory Visit (HOSPITAL_COMMUNITY): Payer: Self-pay

## 2024-03-12 DIAGNOSIS — H02832 Dermatochalasis of right lower eyelid: Secondary | ICD-10-CM | POA: Diagnosis not present

## 2024-03-12 DIAGNOSIS — H02834 Dermatochalasis of left upper eyelid: Secondary | ICD-10-CM | POA: Diagnosis not present

## 2024-03-12 DIAGNOSIS — H02835 Dermatochalasis of left lower eyelid: Secondary | ICD-10-CM | POA: Diagnosis not present

## 2024-03-12 DIAGNOSIS — H02831 Dermatochalasis of right upper eyelid: Secondary | ICD-10-CM | POA: Diagnosis not present

## 2024-03-25 ENCOUNTER — Other Ambulatory Visit (HOSPITAL_COMMUNITY): Payer: Self-pay

## 2024-03-26 ENCOUNTER — Inpatient Hospital Stay: Payer: 59 | Attending: Hematology and Oncology

## 2024-03-26 DIAGNOSIS — C9210 Chronic myeloid leukemia, BCR/ABL-positive, not having achieved remission: Secondary | ICD-10-CM

## 2024-03-26 DIAGNOSIS — C9211 Chronic myeloid leukemia, BCR/ABL-positive, in remission: Secondary | ICD-10-CM | POA: Diagnosis not present

## 2024-03-26 LAB — COMPREHENSIVE METABOLIC PANEL WITH GFR
ALT: 14 U/L (ref 0–44)
AST: 15 U/L (ref 15–41)
Albumin: 4.1 g/dL (ref 3.5–5.0)
Alkaline Phosphatase: 50 U/L (ref 38–126)
Anion gap: 8 (ref 5–15)
BUN: 17 mg/dL (ref 8–23)
CO2: 23 mmol/L (ref 22–32)
Calcium: 9.1 mg/dL (ref 8.9–10.3)
Chloride: 106 mmol/L (ref 98–111)
Creatinine, Ser: 0.73 mg/dL (ref 0.44–1.00)
GFR, Estimated: >60 mL/min (ref >60)
Glucose, Bld: 121 mg/dL — ABNORMAL HIGH (ref 70–99)
Potassium: 4 mmol/L (ref 3.5–5.1)
Sodium: 137 mmol/L (ref 135–145)
Total Bilirubin: 0.3 mg/dL (ref 0.0–1.2)
Total Protein: 6.8 g/dL (ref 6.5–8.1)

## 2024-03-26 LAB — CBC WITH DIFFERENTIAL/PLATELET
Abs Immature Granulocytes: 0.01 K/uL (ref 0.00–0.07)
Basophils Absolute: 0.1 K/uL (ref 0.0–0.1)
Basophils Relative: 1 %
Eosinophils Absolute: 0.2 K/uL (ref 0.0–0.5)
Eosinophils Relative: 4 %
HCT: 37.5 % (ref 36.0–46.0)
Hemoglobin: 13.3 g/dL (ref 12.0–15.0)
Immature Granulocytes: 0 %
Lymphocytes Relative: 27 %
Lymphs Abs: 1.3 K/uL (ref 0.7–4.0)
MCH: 30.9 pg (ref 26.0–34.0)
MCHC: 35.5 g/dL (ref 30.0–36.0)
MCV: 87.2 fL (ref 80.0–100.0)
Monocytes Absolute: 0.5 K/uL (ref 0.1–1.0)
Monocytes Relative: 10 %
Neutro Abs: 2.8 K/uL (ref 1.7–7.7)
Neutrophils Relative %: 58 %
Platelets: 281 K/uL (ref 150–400)
RBC: 4.3 MIL/uL (ref 3.87–5.11)
RDW: 12.2 % (ref 11.5–15.5)
WBC: 4.9 K/uL (ref 4.0–10.5)
nRBC: 0 % (ref 0.0–0.2)

## 2024-03-29 LAB — BCR-ABL1, CML/ALL, PCR, QUANT
E1A2 Transcript: <0.0032 %
Interpretation (BCRAL):: NEGATIVE
b2a2 transcript: <0.0032 %
b3a2 transcript: <0.0032 %

## 2024-03-31 ENCOUNTER — Other Ambulatory Visit: Payer: Self-pay | Admitting: Adult Health

## 2024-03-31 ENCOUNTER — Other Ambulatory Visit (HOSPITAL_COMMUNITY): Payer: Self-pay

## 2024-04-01 ENCOUNTER — Ambulatory Visit: Payer: Self-pay | Admitting: Hematology and Oncology

## 2024-04-01 ENCOUNTER — Other Ambulatory Visit: Payer: Self-pay | Admitting: Family Medicine

## 2024-04-01 ENCOUNTER — Other Ambulatory Visit: Payer: Self-pay

## 2024-04-01 ENCOUNTER — Other Ambulatory Visit (HOSPITAL_COMMUNITY): Payer: Self-pay

## 2024-04-01 DIAGNOSIS — E039 Hypothyroidism, unspecified: Secondary | ICD-10-CM

## 2024-04-01 MED ORDER — LEVOTHYROXINE SODIUM 150 MCG PO TABS
150.0000 ug | ORAL_TABLET | Freq: Every day | ORAL | 0 refills | Status: DC
Start: 2024-04-01 — End: 2024-06-09
  Filled 2024-04-01: qty 30, 30d supply, fill #0

## 2024-04-01 NOTE — Telephone Encounter (Signed)
-----   Message from Almarie Bedford sent at 04/01/2024  9:23 AM EDT ----- Pls call her and let her know her tests are normal ----- Message ----- From: Interface, Lab In [place] Sent: 03/26/2024   8:26 AM EDT To: Almarie Bedford, MD

## 2024-04-01 NOTE — Telephone Encounter (Signed)
 LVM for patient regarding recent lab results. Provided callback number should patient have any questions.

## 2024-04-01 NOTE — Progress Notes (Signed)
 Refill medication since patients primary is not in office.

## 2024-04-08 ENCOUNTER — Other Ambulatory Visit (HOSPITAL_COMMUNITY): Payer: Self-pay

## 2024-06-09 ENCOUNTER — Other Ambulatory Visit: Payer: Self-pay | Admitting: Family Medicine

## 2024-06-09 DIAGNOSIS — E039 Hypothyroidism, unspecified: Secondary | ICD-10-CM

## 2024-06-11 ENCOUNTER — Other Ambulatory Visit: Payer: Self-pay | Admitting: Family Medicine

## 2024-06-11 ENCOUNTER — Other Ambulatory Visit (HOSPITAL_COMMUNITY): Payer: Self-pay

## 2024-06-11 DIAGNOSIS — E039 Hypothyroidism, unspecified: Secondary | ICD-10-CM

## 2024-06-11 MED ORDER — LEVOTHYROXINE SODIUM 150 MCG PO TABS
150.0000 ug | ORAL_TABLET | Freq: Every day | ORAL | 0 refills | Status: DC
  Filled 2024-06-11: qty 30, 30d supply, fill #0

## 2024-06-13 ENCOUNTER — Other Ambulatory Visit (HOSPITAL_COMMUNITY): Payer: Self-pay

## 2024-06-13 ENCOUNTER — Encounter: Payer: Self-pay | Admitting: Psychiatry

## 2024-06-13 ENCOUNTER — Ambulatory Visit (INDEPENDENT_AMBULATORY_CARE_PROVIDER_SITE_OTHER): Admitting: Psychiatry

## 2024-06-13 DIAGNOSIS — F4310 Post-traumatic stress disorder, unspecified: Secondary | ICD-10-CM
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
## 2024-06-16 ENCOUNTER — Other Ambulatory Visit: Payer: Self-pay | Admitting: Adult Health

## 2024-06-18 ENCOUNTER — Other Ambulatory Visit (HOSPITAL_COMMUNITY): Payer: Self-pay

## 2024-06-18 MED ORDER — ZOLPIDEM TARTRATE 10 MG PO TABS
10.0000 mg | ORAL_TABLET | Freq: Every evening | ORAL | 0 refills | Status: DC | PRN
  Filled 2024-06-18 – 2024-06-21 (×2): qty 90, 90d supply, fill #0

## 2024-06-18 NOTE — Telephone Encounter (Signed)
 Please advise. Pt has not been seen in over a year. Tried to call pt 2x to schedule appt. Going to vm.

## 2024-06-19 ENCOUNTER — Other Ambulatory Visit (HOSPITAL_COMMUNITY): Payer: Self-pay

## 2024-06-19 ENCOUNTER — Other Ambulatory Visit: Payer: Self-pay

## 2024-06-21 ENCOUNTER — Other Ambulatory Visit (HOSPITAL_COMMUNITY): Payer: Self-pay

## 2024-06-25 ENCOUNTER — Inpatient Hospital Stay: Attending: Hematology and Oncology

## 2024-06-25 ENCOUNTER — Other Ambulatory Visit: Payer: Self-pay | Admitting: Family

## 2024-06-25 DIAGNOSIS — C9210 Chronic myeloid leukemia, BCR/ABL-positive, not having achieved remission: Secondary | ICD-10-CM

## 2024-06-25 DIAGNOSIS — C9211 Chronic myeloid leukemia, BCR/ABL-positive, in remission: Secondary | ICD-10-CM | POA: Diagnosis present

## 2024-06-25 LAB — CBC WITH DIFFERENTIAL/PLATELET
Abs Immature Granulocytes: 0.01 K/uL (ref 0.00–0.07)
Basophils Absolute: 0.1 K/uL (ref 0.0–0.1)
Basophils Relative: 1 %
Eosinophils Absolute: 0.2 K/uL (ref 0.0–0.5)
Eosinophils Relative: 3 %
HCT: 38.2 % (ref 36.0–46.0)
Hemoglobin: 13.4 g/dL (ref 12.0–15.0)
Immature Granulocytes: 0 %
Lymphocytes Relative: 27 %
Lymphs Abs: 1.3 K/uL (ref 0.7–4.0)
MCH: 30.3 pg (ref 26.0–34.0)
MCHC: 35.1 g/dL (ref 30.0–36.0)
MCV: 86.4 fL (ref 80.0–100.0)
Monocytes Absolute: 0.5 K/uL (ref 0.1–1.0)
Monocytes Relative: 11 %
Neutro Abs: 2.8 K/uL (ref 1.7–7.7)
Neutrophils Relative %: 58 %
Platelets: 274 K/uL (ref 150–400)
RBC: 4.42 MIL/uL (ref 3.87–5.11)
RDW: 12.4 % (ref 11.5–15.5)
WBC: 4.9 K/uL (ref 4.0–10.5)
nRBC: 0 % (ref 0.0–0.2)

## 2024-06-25 LAB — COMPREHENSIVE METABOLIC PANEL WITH GFR
ALT: 16 U/L (ref 0–44)
AST: 16 U/L (ref 15–41)
Albumin: 4.1 g/dL (ref 3.5–5.0)
Alkaline Phosphatase: 59 U/L (ref 38–126)
Anion gap: 7 (ref 5–15)
BUN: 21 mg/dL (ref 8–23)
CO2: 23 mmol/L (ref 22–32)
Calcium: 9.4 mg/dL (ref 8.9–10.3)
Chloride: 106 mmol/L (ref 98–111)
Creatinine, Ser: 0.69 mg/dL (ref 0.44–1.00)
GFR, Estimated: >60 mL/min (ref >60)
Glucose, Bld: 91 mg/dL (ref 70–99)
Potassium: 3.9 mmol/L (ref 3.5–5.1)
Sodium: 136 mmol/L (ref 135–145)
Total Bilirubin: 0.4 mg/dL (ref 0.0–1.2)
Total Protein: 6.8 g/dL (ref 6.5–8.1)

## 2024-06-26 ENCOUNTER — Inpatient Hospital Stay: Payer: 59

## 2024-07-01 ENCOUNTER — Telehealth: Payer: Self-pay

## 2024-07-01 LAB — BCR-ABL1, CML/ALL, PCR, QUANT
E1A2 Transcript: <0.0032 %
Interpretation (BCRAL):: NEGATIVE
b2a2 transcript: <0.0032 %
b3a2 transcript: <0.0032 %

## 2024-07-01 NOTE — Telephone Encounter (Signed)
 Called and given below message. Arlyce verbalized understanding.

## 2024-07-01 NOTE — Telephone Encounter (Signed)
-----   Message from Almarie Bedford sent at 07/01/2024  2:46 PM EST ----- Pls call her BCR/ABL is not detected she is still in remission

## 2024-07-02 ENCOUNTER — Other Ambulatory Visit: Payer: Self-pay | Admitting: Adult Health

## 2024-07-02 ENCOUNTER — Other Ambulatory Visit (HOSPITAL_COMMUNITY): Payer: Self-pay

## 2024-07-03 ENCOUNTER — Other Ambulatory Visit (HOSPITAL_COMMUNITY): Payer: Self-pay

## 2024-07-03 MED ORDER — VERAPAMIL HCL ER 240 MG PO TBCR
240.0000 mg | EXTENDED_RELEASE_TABLET | Freq: Every day | ORAL | 0 refills | Status: DC
  Filled 2024-07-03: qty 90, 90d supply, fill #0

## 2024-07-15 ENCOUNTER — Other Ambulatory Visit (HOSPITAL_COMMUNITY): Payer: Self-pay

## 2024-08-10 ENCOUNTER — Other Ambulatory Visit: Payer: Self-pay | Admitting: Family

## 2024-08-10 ENCOUNTER — Other Ambulatory Visit: Payer: Self-pay | Admitting: Adult Health

## 2024-08-10 DIAGNOSIS — E039 Hypothyroidism, unspecified: Secondary | ICD-10-CM

## 2024-08-13 ENCOUNTER — Other Ambulatory Visit (HOSPITAL_COMMUNITY): Payer: Self-pay

## 2024-08-13 MED ORDER — LEVOTHYROXINE SODIUM 175 MCG PO TABS
175.0000 ug | ORAL_TABLET | ORAL | 0 refills | Status: DC
  Filled 2024-08-13: qty 15, 30d supply, fill #0

## 2024-08-13 NOTE — Telephone Encounter (Signed)
 Pt advised that she is due for a CPE. Pt has been scheduled 15 tabs sent in to get pt to her appt. No further action needed.

## 2024-08-14 ENCOUNTER — Other Ambulatory Visit (HOSPITAL_COMMUNITY): Payer: Self-pay

## 2024-08-14 ENCOUNTER — Encounter (HOSPITAL_COMMUNITY): Payer: Self-pay

## 2024-08-15 ENCOUNTER — Ambulatory Visit (INDEPENDENT_AMBULATORY_CARE_PROVIDER_SITE_OTHER): Admitting: Psychiatry

## 2024-08-15 ENCOUNTER — Other Ambulatory Visit: Payer: Self-pay | Admitting: Adult Health

## 2024-08-15 ENCOUNTER — Other Ambulatory Visit (HOSPITAL_COMMUNITY): Payer: Self-pay

## 2024-08-15 DIAGNOSIS — E039 Hypothyroidism, unspecified: Secondary | ICD-10-CM

## 2024-08-15 DIAGNOSIS — F4310 Post-traumatic stress disorder, unspecified: Secondary | ICD-10-CM | POA: Diagnosis not present

## 2024-08-15 DIAGNOSIS — G4700 Insomnia, unspecified: Secondary | ICD-10-CM

## 2024-08-15 DIAGNOSIS — I10 Essential (primary) hypertension: Secondary | ICD-10-CM

## 2024-08-15 MED ORDER — ZOLPIDEM TARTRATE 10 MG PO TABS: 10.0000 mg | ORAL_TABLET | Freq: Every evening | ORAL | 0 refills | Status: AC | PRN

## 2024-08-15 MED ORDER — LOSARTAN POTASSIUM 50 MG PO TABS: 50.0000 mg | ORAL_TABLET | Freq: Every day | ORAL | 1 refills | Status: AC

## 2024-08-15 MED ORDER — LEVOTHYROXINE SODIUM 175 MCG PO TABS: 175.0000 ug | ORAL_TABLET | ORAL | 1 refills | Status: AC

## 2024-08-15 MED ORDER — LEVOTHYROXINE SODIUM 150 MCG PO TABS: 150.0000 ug | ORAL_TABLET | Freq: Every day | ORAL | 1 refills | Status: AC

## 2024-08-15 MED ORDER — VERAPAMIL HCL ER 240 MG PO TBCR: 240.0000 mg | EXTENDED_RELEASE_TABLET | Freq: Every day | ORAL | 1 refills | Status: AC

## 2024-08-15 NOTE — Progress Notes (Signed)
"   °      [facility] Counselor/Therapist Progress Note  Patient ID: Christina [name], MRN: [id number],    Date: 08/15/2024  Time Spent: 51 minutes start time 4:06 end time 4:57 PM  Treatment Type: Individual Therapy  Reported Symptoms: triggered responses, flashbacks, grief issues anxiety, rumination  Mental Status Exam:  Appearance:   Well Groomed     Behavior:  Appropriate  Motor:  Normal  Speech/Language:   Normal Rate  Affect:  Appropriate  Mood:  anxious  Thought process:  normal  Thought content:    WNL  Sensory/Perceptual disturbances:    WNL  Orientation:  oriented to person, place, time/date, and situation  Attention:  Good  Concentration:  Good  Memory:  WNL  Fund of knowledge:   Good  Insight:    Good  Judgment:   Good  Impulse Control:  Good   Risk Assessment: Danger to Self:  No Self-injurious Behavior: No Danger to Others: No Duty to Warn:no Physical Aggression / Violence:No  Access to Firearms a concern: No  Gang Involvement:No   Subjective: Patient was present for session.  Patient stated that things were difficult but okay over the holiday season.  She had noticed certain places in town where she feels more flooded with flashbacks and memories of her son.  Patient stated that currently her other stressor is her father who is 60 and starting to decline.  She shared that she and her daughter are trying to do what they can to keep him functioning and being able to manage on his own as he is right now.  Patient developed treatment plan and goals in session.  After that she was taught diaphragmatic breathing triangle breathing and box breathing.  She was also taught the grounded and 5 exercise and did a safe place exercise in session.  Reviewed DBT skill TIP P with patient and gave her handout to utilize.  Instructions for all the different exercises were written out for patient.  She was also encouraged to start working in movement/exercise throughout the day to release  negative emotions appropriately.  Discussed starting processing work at next session.  Interventions: Dialectical Behavioral Therapy and Solution-Oriented/Positive Psychology  Diagnosis:   ICD-10-CM   1. PTSD (post-traumatic stress disorder)  F43.10       Plan: Patient is to practice grounding exercises and DBT skills.  Patient is to work in movement and exercise throughout the day.  Patient is to practice diaphragmatic breathing for 2 minutes 4-5 times a day.  Patient is to continue working with providers for other health issues.  Silvano Pacini, [facility]                   "

## 2024-08-22 ENCOUNTER — Ambulatory Visit: Admitting: Psychiatry

## 2024-08-22 DIAGNOSIS — F4310 Post-traumatic stress disorder, unspecified: Secondary | ICD-10-CM

## 2024-08-22 NOTE — Progress Notes (Signed)
"   °      [facility] Counselor/Therapist Progress Note  Patient ID: Christina [name], MRN: [id number],    Date: 08/22/2024  Time Spent: 52 minutes start time 11:08 AM end time 12:00 PM  Treatment Type: Individual Therapy  Reported Symptoms: flashbacks, triggered responses, anxiety, sadness, grief issues, rumination  Mental Status Exam:  Appearance:   Well Groomed     Behavior:  Appropriate  Motor:  Normal  Speech/Language:   Normal Rate  Affect:  Appropriate tearful  Mood:  sad  Thought process:  normal  Thought content:    WNL  Sensory/Perceptual disturbances:    WNL  Orientation:  oriented to person, place, time/date, and situation  Attention:  Good  Concentration:  Good  Memory:  WNL  Fund of knowledge:   Good  Insight:    Good  Judgment:   Good  Impulse Control:  Good   Risk Assessment: Danger to Self:  No Self-injurious Behavior: No Danger to Others: No Duty to Warn:no Physical Aggression / Violence:No  Access to Firearms a concern: No  Gang Involvement:No   Subjective: Patient was present for session. She shared that the an anniversary date happened a few weeks ago and that has brought up lots of guilt and sadness for her.  She shared she is trying to use the tools but it has been challenging. Did processing set on that day. She shared the worst picture was seeing her son and not talking to him. SUDS level 10, negative cognition It's my fault felt remorse in her shoulders.  Patient was able to reduce as level 2-3.  She was able to recognize that they had done a great deal to try and help their son prior to doing the restraining order.  She was also able to recall multiple safety concerns that led her to having to do the restraining order and she also realized that was the only option to keep him into treatment.  Patient was encouraged to allow the processing to continue and to journal what surfaces.  Interventions: Eye Movement Desensitization and Reprocessing (EMDR) and  Insight-Oriented  Diagnosis:   ICD-10-CM   1. PTSD (post-traumatic stress disorder)  F43.10       Plan:  Patient is to practice grounding exercises and DBT skills.  Patient is to work on writing out things that she did to try and help her son.  Patient is to work in movement and exercise throughout the day.  Patient is to practice diaphragmatic breathing for 2 minutes 4-5 times a day.  Patient is to continue working with providers for other health issues.    Silvano Pacini, [facility]                   "

## 2024-08-29 ENCOUNTER — Ambulatory Visit: Admitting: Adult Health

## 2024-08-29 ENCOUNTER — Encounter: Payer: Self-pay | Admitting: Adult Health

## 2024-08-29 ENCOUNTER — Ambulatory Visit: Admitting: Psychiatry

## 2024-08-29 ENCOUNTER — Ambulatory Visit: Payer: Self-pay | Admitting: Adult Health

## 2024-08-29 VITALS — BP 112/72 | HR 68 | Temp 97.7°F | Ht 67.0 in | Wt 165.0 lb

## 2024-08-29 DIAGNOSIS — C9210 Chronic myeloid leukemia, BCR/ABL-positive, not having achieved remission: Secondary | ICD-10-CM | POA: Diagnosis not present

## 2024-08-29 DIAGNOSIS — Z0000 Encounter for general adult medical examination without abnormal findings: Secondary | ICD-10-CM | POA: Diagnosis not present

## 2024-08-29 DIAGNOSIS — E782 Mixed hyperlipidemia: Secondary | ICD-10-CM

## 2024-08-29 DIAGNOSIS — I10 Essential (primary) hypertension: Secondary | ICD-10-CM

## 2024-08-29 DIAGNOSIS — E039 Hypothyroidism, unspecified: Secondary | ICD-10-CM

## 2024-08-29 DIAGNOSIS — Z23 Encounter for immunization: Secondary | ICD-10-CM | POA: Diagnosis not present

## 2024-08-29 LAB — CBC
HCT: 39.1 % (ref 36.0–46.0)
Hemoglobin: 13.6 g/dL (ref 12.0–15.0)
MCHC: 34.7 g/dL (ref 30.0–36.0)
MCV: 88.2 fl (ref 78.0–100.0)
Platelets: 316 K/uL (ref 150.0–400.0)
RBC: 4.43 Mil/uL (ref 3.87–5.11)
RDW: 12.9 % (ref 11.5–15.5)
WBC: 5 K/uL (ref 4.0–10.5)

## 2024-08-29 LAB — COMPREHENSIVE METABOLIC PANEL WITH GFR
ALT: 21 U/L (ref 3–35)
AST: 19 U/L (ref 5–37)
Albumin: 4.2 g/dL (ref 3.5–5.2)
Alkaline Phosphatase: 48 U/L (ref 39–117)
BUN: 19 mg/dL (ref 6–23)
CO2: 27 meq/L (ref 19–32)
Calcium: 9.7 mg/dL (ref 8.4–10.5)
Chloride: 104 meq/L (ref 96–112)
Creatinine, Ser: 0.76 mg/dL (ref 0.40–1.20)
GFR: 82.97 mL/min
Glucose, Bld: 86 mg/dL (ref 70–99)
Potassium: 4.2 meq/L (ref 3.5–5.1)
Sodium: 137 meq/L (ref 135–145)
Total Bilirubin: 0.4 mg/dL (ref 0.2–1.2)
Total Protein: 7.2 g/dL (ref 6.0–8.3)

## 2024-08-29 LAB — LIPID PANEL
Cholesterol: 182 mg/dL (ref 28–200)
HDL: 57.5 mg/dL
LDL Cholesterol: 106 mg/dL — ABNORMAL HIGH (ref 10–99)
NonHDL: 124.38
Total CHOL/HDL Ratio: 3
Triglycerides: 92 mg/dL (ref 10.0–149.0)
VLDL: 18.4 mg/dL (ref 0.0–40.0)

## 2024-08-29 LAB — TSH: TSH: 0.2 u[IU]/mL — ABNORMAL LOW (ref 0.35–5.50)

## 2024-08-29 NOTE — Progress Notes (Signed)
 "  Complete physical exam  Patient: Christina [name]   DOB: [birth year]   64 y.o. Female  MRN: [id number]  Subjective:    Chief Complaint  Patient presents with   Annual Exam    Christina [name] is a 64 y.o. female who presents today for a complete physical exam. She reports consuming a healthy diet consisting of fruits, vegetables, and lean meats. She states she walks daily and also does strength and endurance training with a trainer several days per week. She generally feels well. She reports she continues to have difficulty staying asleep; however, this issue is better when she takes the prescribed Ambien . She does not have additional problems to discuss today.    Most recent fall risk assessment:    06/14/2022    1:41 PM  Fall Risk   Falls in the past year? 0  Number falls in past yr: 0  Injury with Fall? 0   Risk for fall due to : No Fall Risks  Follow up Falls evaluation completed      Data saved with a previous flowsheet row definition     Most recent depression screenings:    08/29/2024    7:34 AM 06/14/2022    1:41 PM  PHQ 2/9 Scores  PHQ - 2 Score 0 1  PHQ- 9 Score  3      Data saved with a previous flowsheet row definition    Vision: annual visits Dental: twice yearly Dermatology: annual skin checks with dermatologist Pap and mammogram: patient reports she will schedule with gyn DEXA: normal 08/2022 Immunizations: influenza today; patient reports she will get pneumococcal at the pharmacy  Patient Care Team: Merna Huxley, NP as PCP - General (Family Medicine) Tommas Pears, MD (Endocrinology) Lenon Faden, MD (Internal Medicine) Henry Slough, MD as Attending Physician (Obstetrics and Gynecology) Lonn Hicks, MD as Consulting Physician (Hematology and Oncology) Shellia Oh, MD as Consulting Physician (Pulmonary Disease)   Show/hide medication list[1]  Review of Systems  Constitutional:  Negative for chills and fever.  HENT:  Positive for hearing loss.  Negative for ear pain.        Wears hearing aids  Eyes:  Negative for blurred vision and double vision.  Respiratory:  Negative for cough and shortness of breath.   Cardiovascular:  Negative for chest pain and palpitations.  Gastrointestinal:  Negative for abdominal pain and heartburn.  Genitourinary:  Negative for dysuria and frequency.  Musculoskeletal:  Negative for back pain and myalgias.  Skin:  Negative for rash.  Neurological:  Negative for dizziness and headaches.  Endo/Heme/Allergies:  Does not bruise/bleed easily.  Psychiatric/Behavioral:  Negative for depression.       Objective:     BP 112/72   Pulse 68   Temp 97.7 F (36.5 C) (Oral)   Ht 5' 7 (1.702 m)   Wt 74.8 kg   SpO2 98%   BMI 25.84 kg/m    Physical Exam Vitals reviewed.  Constitutional:      General: She is not in acute distress.    Appearance: Normal appearance. She is not ill-appearing.  HENT:     Head: Normocephalic and atraumatic.     Nose: Nose normal. No congestion or rhinorrhea.     Mouth/Throat:     Mouth: Mucous membranes are moist.     Pharynx: Oropharynx is clear. No posterior oropharyngeal erythema.  Eyes:     Extraocular Movements: Extraocular movements intact.     Conjunctiva/sclera: Conjunctivae normal.  Pupils: Pupils are equal, round, and reactive to light.  Neck:     Vascular: No carotid bruit.  Cardiovascular:     Rate and Rhythm: Normal rate and regular rhythm.     Pulses: Normal pulses.     Heart sounds: Normal heart sounds. No murmur heard.    No friction rub. No gallop.  Pulmonary:     Effort: Pulmonary effort is normal.     Breath sounds: Normal breath sounds.  Abdominal:     General: Bowel sounds are normal. There is no distension.     Palpations: Abdomen is soft. There is no mass.     Tenderness: There is no abdominal tenderness.  Musculoskeletal:        General: Normal range of motion.     Cervical back: Normal range of motion and neck supple. No tenderness.   Lymphadenopathy:     Cervical: No cervical adenopathy.  Skin:    General: Skin is warm and dry.     Capillary Refill: Capillary refill takes less than 2 seconds.  Neurological:     Mental Status: She is oriented to person, place, and time.  Psychiatric:        Mood and Affect: Mood normal.        Behavior: Behavior normal.        Assessment & Plan:    Routine Health Maintenance and Physical Exam  Immunization History  Administered Date(s) Administered   Influenza Split 05/04/2011, 04/25/2012   Influenza Whole 06/18/2007, 06/04/2008, 05/13/2010   Influenza,inj,Quad PF,6+ Mos 05/13/2015, 05/24/2017, 05/30/2018, 04/17/2019, 06/03/2020, 05/19/2021, 05/31/2022   Influenza-Unspecified 04/18/2012   Pfizer Covid-19 Vaccine Bivalent Booster 60yrs & up 05/10/2022   Tdap 04/17/2019   Zoster Recombinant(Shingrix) 06/07/2017, 08/09/2017    Health Maintenance  Topic Date Due   HIV Screening  Never done   Hepatitis C Screening  Never done   Pneumococcal Vaccine: 50+ Years (1 of 2 - PCV) Never done   Cervical Cancer Screening (HPV/Pap Cotest)  09/11/2016   COVID-19 Vaccine (2 - Pfizer risk series) 05/31/2022   Influenza Vaccine  03/08/2024   Mammogram  10/27/2024   DTaP/Tdap/Td (2 - Td or Tdap) 04/16/2029   Colonoscopy  08/26/2029   HPV VACCINES (No Doses Required) Completed   Zoster Vaccines- Shingrix  Completed   Hepatitis B Vaccines 19-59 Average Risk  Aged Out   Meningococcal B Vaccine  Aged Out    1. Routine general medical examination at a health care facility (Primary) - Continue healthy diet - Continue physical activity, weight-bearing exercises, and resistance training - Follow-up with gyn as planned for Pap and mammogram - Follow-up with dermatologist as planned for annual skin assessment - Encouraged sleep hygiene practices  2. Hypothyroidism, unspecified type - CBC; Future - CMP; Future - Lipid panel; Future - TSH; Future - Continue levothyroxine  pending  labs.  3. Moderate mixed hyperlipidemia not requiring statin therapy - CBC; Future - CMP; Future - Lipid panel; Future - TSH; Future - Continue healthy diet and physical activity - Will consider statin therapy if indicated with pending labs  4. Essential hypertension - CBC; Future - CMP; Future - Lipid panel; Future - TSH; Future - Stable. Continue losartan  and verapamil .   5. CML (chronic myeloid leukemia) (HCC) - CBC; Future - CMP; Future - Lipid panel; Future - TSH; Future - No new concerns. Continue follow-up with oncology.   6. Need for influenza vaccination - Flu vaccine trivalent PF, 6mos and older(Flulaval,Afluria,Fluarix,Fluzone)     Christina HERO  Oley, RN FNP Student      [1]  Outpatient Medications Prior to Visit  Medication Sig   levothyroxine  (SYNTHROID ) 150 MCG tablet Take 1 tablet (150 mcg total) by mouth daily.   levothyroxine  (SYNTHROID ) 175 MCG tablet Take 1 tablet (175 mcg total) by mouth every other day.   losartan  (COZAAR ) 50 MG tablet Take 1 tablet (50 mg total) by mouth daily.   verapamil  (CALAN -SR) 240 MG CR tablet Take 1 tablet (240 mg total) by mouth at bedtime. Due for annual exam   zolpidem  (AMBIEN ) 10 MG tablet Take 1 tablet (10 mg total) by mouth at bedtime as needed for sleep   tretinoin  (RETIN-A ) 0.025 % cream Apply a pearl sized amount topically to face every evening.   No facility-administered medications prior to visit.   "

## 2024-09-05 ENCOUNTER — Ambulatory Visit (INDEPENDENT_AMBULATORY_CARE_PROVIDER_SITE_OTHER): Admitting: Psychiatry

## 2024-09-05 DIAGNOSIS — F4310 Post-traumatic stress disorder, unspecified: Secondary | ICD-10-CM | POA: Diagnosis not present

## 2024-09-05 NOTE — Progress Notes (Signed)
 "       [facility] Counselor/Therapist Progress Note  Patient ID: Christina [name], MRN: [id number],    Date: 09/05/2024  Time Spent: 45 minutes start time 11:15 AM end time 12 PM  Treatment Type: Individual Therapy  Reported Symptoms: anxiety, triggered responses, sadness, rumination  Mental Status Exam:  Appearance:   Well Groomed     Behavior:  Appropriate  Motor:  Normal  Speech/Language:   Normal Rate  Affect:  Appropriate  Mood:  anxious  Thought process:  normal  Thought content:    WNL  Sensory/Perceptual disturbances:    WNL  Orientation:  oriented to person, place, time/date, and situation  Attention:  Good  Concentration:  Good  Memory:  WNL  Fund of knowledge:   Good  Insight:    Good  Judgment:   Good  Impulse Control:  Good   Risk Assessment: Danger to Self:  No Self-injurious Behavior: No Danger to Others: No Duty to Warn:no Physical Aggression / Violence:No  Access to Firearms a concern: No  Gang Involvement:No   Subjective: Patient was present for session. She was stressed due to weather and having to take care of aging parents. She did pretty well after last session. She has been thinking about her son with the weather.  Patient went on to share that the thing that is stressing her out the most is her father.  She shared she has not been able to get over there is much as she would like to to check on him and she is feeling lots of guilt.  Patient stated she had asked him to come over and stay with them but he did not want to do that.  She was able to think through lots of times that she did go over and trying to help him during this time.  Patient stated her husband is very supportive and that has made a huge difference.  She went on to share that unfortunately his parents are stuck and needing things in [place]  and he cannot get to them so that is frustrating.  Discussed different options to get his parents what they need and importance of utilizing services like  [employer] or [employer] for both of their parents during this very difficult time when they should not be going outside.  Patient reported feeling good about thinking through alternatives to doing things for self since she is getting overwhelmed and it is not able to do all the things that her brother and father would like for her to do for him.  Patient was encouraged to recognize that all she is doing and to feel good about that and since he is making some choices like living in his own apartment rather than going to an independent living situation or assisted living situation she is only capable of doing so much.  Encouraged patient to reassure herself and to recognize how this could be bringing up issues with her son and her PTSD.  Patient was encouraged just to notice what surfaces and that at next session processing on that issue could be addressed.  Interventions: Solution-Oriented/Positive Psychology and Insight-Oriented  Diagnosis:   ICD-10-CM   1. PTSD (post-traumatic stress disorder)  F43.10       Plan:  Patient is to practice grounding exercises and DBT skills.  Patient is to work on plans from session to try and get her father's needs met in a realistic manner for her.  Patient is to work on diplomatic services operational officer out things  that she did to try and help her son.  Patient is to work in movement and exercise throughout the day.  Patient is to practice diaphragmatic breathing for 2 minutes 4-5 times a day.  Patient is to continue working with providers for other health issues.   Silvano Pacini, [facility]                   "

## 2024-09-16 ENCOUNTER — Inpatient Hospital Stay: Payer: 59 | Attending: Hematology and Oncology

## 2024-09-19 ENCOUNTER — Ambulatory Visit: Admitting: Psychiatry

## 2024-09-26 ENCOUNTER — Inpatient Hospital Stay: Payer: 59 | Admitting: Hematology and Oncology

## 2024-10-03 ENCOUNTER — Ambulatory Visit: Admitting: Psychiatry

## 2024-10-24 ENCOUNTER — Ambulatory Visit: Admitting: Psychiatry
